# Patient Record
Sex: Female | Born: 1970 | Race: Black or African American | Hispanic: No | Marital: Married | Smoking: Never smoker
Health system: Southern US, Community
[De-identification: ages and names within clinical notes are randomized; demographics above are authoritative.]

## PROBLEM LIST (undated history)

## (undated) ENCOUNTER — Inpatient Hospital Stay (HOSPITAL_COMMUNITY): Payer: Self-pay

## (undated) DIAGNOSIS — E8881 Metabolic syndrome: Secondary | ICD-10-CM

## (undated) DIAGNOSIS — R51 Headache: Secondary | ICD-10-CM

## (undated) DIAGNOSIS — M1990 Unspecified osteoarthritis, unspecified site: Secondary | ICD-10-CM

## (undated) DIAGNOSIS — F329 Major depressive disorder, single episode, unspecified: Secondary | ICD-10-CM

## (undated) DIAGNOSIS — F419 Anxiety disorder, unspecified: Secondary | ICD-10-CM

## (undated) DIAGNOSIS — Z9289 Personal history of other medical treatment: Secondary | ICD-10-CM

## (undated) DIAGNOSIS — F32A Depression, unspecified: Secondary | ICD-10-CM

## (undated) DIAGNOSIS — L709 Acne, unspecified: Secondary | ICD-10-CM

## (undated) DIAGNOSIS — D649 Anemia, unspecified: Secondary | ICD-10-CM

## (undated) DIAGNOSIS — R011 Cardiac murmur, unspecified: Secondary | ICD-10-CM

## (undated) DIAGNOSIS — R519 Headache, unspecified: Secondary | ICD-10-CM

## (undated) DIAGNOSIS — K219 Gastro-esophageal reflux disease without esophagitis: Secondary | ICD-10-CM

## (undated) DIAGNOSIS — I10 Essential (primary) hypertension: Secondary | ICD-10-CM

## (undated) HISTORY — PX: JOINT REPLACEMENT: SHX530

## (undated) HISTORY — PX: MOUTH SURGERY: SHX715

## (undated) HISTORY — DX: Cardiac murmur, unspecified: R01.1

## (undated) HISTORY — PX: ESOPHAGOGASTRODUODENOSCOPY: SHX1529

## (undated) HISTORY — PX: COLONOSCOPY: SHX174

## (undated) HISTORY — DX: Acne, unspecified: L70.9

## (undated) HISTORY — DX: Gastro-esophageal reflux disease without esophagitis: K21.9

---

## 2002-08-14 ENCOUNTER — Encounter: Payer: Self-pay | Admitting: Family Medicine

## 2002-08-14 ENCOUNTER — Encounter: Admission: RE | Admit: 2002-08-14 | Discharge: 2002-08-14 | Payer: Self-pay | Admitting: Family Medicine

## 2002-08-28 ENCOUNTER — Other Ambulatory Visit: Admission: RE | Admit: 2002-08-28 | Discharge: 2002-08-28 | Payer: Self-pay | Admitting: *Deleted

## 2002-10-31 ENCOUNTER — Encounter: Admission: RE | Admit: 2002-10-31 | Discharge: 2002-10-31 | Payer: Self-pay | Admitting: Family Medicine

## 2002-10-31 ENCOUNTER — Encounter: Payer: Self-pay | Admitting: Family Medicine

## 2003-09-03 ENCOUNTER — Inpatient Hospital Stay (HOSPITAL_COMMUNITY): Admission: AD | Admit: 2003-09-03 | Discharge: 2003-09-03 | Payer: Self-pay | Admitting: *Deleted

## 2003-09-12 ENCOUNTER — Inpatient Hospital Stay (HOSPITAL_COMMUNITY): Admission: AD | Admit: 2003-09-12 | Discharge: 2003-09-15 | Payer: Self-pay | Admitting: *Deleted

## 2003-10-23 ENCOUNTER — Encounter: Admission: RE | Admit: 2003-10-23 | Discharge: 2003-11-22 | Payer: Self-pay | Admitting: *Deleted

## 2003-10-24 ENCOUNTER — Other Ambulatory Visit: Admission: RE | Admit: 2003-10-24 | Discharge: 2003-10-24 | Payer: Self-pay | Admitting: *Deleted

## 2003-11-23 ENCOUNTER — Encounter: Admission: RE | Admit: 2003-11-23 | Discharge: 2003-12-23 | Payer: Self-pay | Admitting: *Deleted

## 2004-01-23 ENCOUNTER — Encounter: Admission: RE | Admit: 2004-01-23 | Discharge: 2004-02-22 | Payer: Self-pay | Admitting: *Deleted

## 2004-02-23 ENCOUNTER — Encounter: Admission: RE | Admit: 2004-02-23 | Discharge: 2004-03-24 | Payer: Self-pay | Admitting: *Deleted

## 2004-04-22 ENCOUNTER — Encounter: Admission: RE | Admit: 2004-04-22 | Discharge: 2004-05-22 | Payer: Self-pay | Admitting: *Deleted

## 2004-06-22 ENCOUNTER — Encounter: Admission: RE | Admit: 2004-06-22 | Discharge: 2004-07-22 | Payer: Self-pay | Admitting: Obstetrics and Gynecology

## 2004-11-23 ENCOUNTER — Other Ambulatory Visit: Admission: RE | Admit: 2004-11-23 | Discharge: 2004-11-23 | Payer: Self-pay | Admitting: Obstetrics and Gynecology

## 2005-05-27 ENCOUNTER — Ambulatory Visit: Payer: Self-pay | Admitting: Family Medicine

## 2005-08-05 ENCOUNTER — Ambulatory Visit: Payer: Self-pay | Admitting: Family Medicine

## 2005-08-09 ENCOUNTER — Encounter: Admission: RE | Admit: 2005-08-09 | Discharge: 2005-08-09 | Payer: Self-pay | Admitting: Family Medicine

## 2005-08-09 LAB — HM MAMMOGRAPHY: HM Mammogram: NORMAL

## 2006-01-04 ENCOUNTER — Other Ambulatory Visit: Admission: RE | Admit: 2006-01-04 | Discharge: 2006-01-04 | Payer: Self-pay | Admitting: Obstetrics and Gynecology

## 2006-04-08 ENCOUNTER — Ambulatory Visit: Payer: Self-pay | Admitting: Family Medicine

## 2006-05-16 ENCOUNTER — Ambulatory Visit: Payer: Self-pay | Admitting: Family Medicine

## 2006-06-14 ENCOUNTER — Encounter: Admission: RE | Admit: 2006-06-14 | Discharge: 2006-06-14 | Payer: Self-pay | Admitting: Gastroenterology

## 2007-07-11 ENCOUNTER — Ambulatory Visit: Payer: Self-pay | Admitting: Family Medicine

## 2007-07-11 DIAGNOSIS — R011 Cardiac murmur, unspecified: Secondary | ICD-10-CM | POA: Insufficient documentation

## 2007-07-11 DIAGNOSIS — L708 Other acne: Secondary | ICD-10-CM | POA: Insufficient documentation

## 2007-08-06 ENCOUNTER — Emergency Department: Payer: Self-pay | Admitting: Emergency Medicine

## 2007-09-27 ENCOUNTER — Encounter: Payer: Self-pay | Admitting: Family Medicine

## 2007-10-06 ENCOUNTER — Ambulatory Visit: Payer: Self-pay | Admitting: Family Medicine

## 2007-11-03 ENCOUNTER — Ambulatory Visit: Payer: Self-pay | Admitting: Family Medicine

## 2008-01-04 ENCOUNTER — Ambulatory Visit: Payer: Self-pay | Admitting: Family Medicine

## 2008-08-08 ENCOUNTER — Ambulatory Visit: Payer: Self-pay | Admitting: Family Medicine

## 2009-02-24 ENCOUNTER — Ambulatory Visit: Payer: Self-pay | Admitting: Internal Medicine

## 2009-03-05 ENCOUNTER — Ambulatory Visit: Payer: Self-pay | Admitting: Family Medicine

## 2009-03-05 DIAGNOSIS — R229 Localized swelling, mass and lump, unspecified: Secondary | ICD-10-CM | POA: Insufficient documentation

## 2009-03-12 ENCOUNTER — Encounter: Payer: Self-pay | Admitting: Family Medicine

## 2009-03-19 ENCOUNTER — Ambulatory Visit: Payer: Self-pay | Admitting: Family Medicine

## 2009-03-20 ENCOUNTER — Encounter: Payer: Self-pay | Admitting: Family Medicine

## 2009-03-21 ENCOUNTER — Encounter (INDEPENDENT_AMBULATORY_CARE_PROVIDER_SITE_OTHER): Payer: Self-pay | Admitting: *Deleted

## 2009-08-29 ENCOUNTER — Ambulatory Visit: Payer: Self-pay | Admitting: Family Medicine

## 2009-08-30 ENCOUNTER — Encounter: Payer: Self-pay | Admitting: Family Medicine

## 2009-09-02 LAB — CONVERTED CEMR LAB
ALT: 12 units/L (ref 0–35)
Alkaline Phosphatase: 44 units/L (ref 39–117)
Basophils Relative: 1.9 % (ref 0.0–3.0)
CO2: 29 meq/L (ref 19–32)
Chloride: 106 meq/L (ref 96–112)
Creatinine, Ser: 0.7 mg/dL (ref 0.4–1.2)
Eosinophils Absolute: 0.3 10*3/uL (ref 0.0–0.7)
GFR calc non Af Amer: 120.5 mL/min (ref >60)
HCT: 38.6 % (ref 36.0–46.0)
Lymphs Abs: 2.8 10*3/uL (ref 0.7–4.0)
MCHC: 33.7 g/dL (ref 30.0–36.0)
Monocytes Absolute: 0.5 10*3/uL (ref 0.1–1.0)
Neutro Abs: 4.7 10*3/uL (ref 1.4–7.7)
Potassium: 3.9 meq/L (ref 3.5–5.1)
RBC: 3.96 M/uL (ref 3.87–5.11)
Sodium: 141 meq/L (ref 135–145)
TSH: 0.61 microintl units/mL (ref 0.35–5.50)
Total CHOL/HDL Ratio: 3
WBC: 8.5 10*3/uL (ref 4.5–10.5)

## 2009-09-19 ENCOUNTER — Ambulatory Visit: Payer: Self-pay | Admitting: Family Medicine

## 2009-09-23 ENCOUNTER — Encounter: Payer: Self-pay | Admitting: Family Medicine

## 2010-01-20 ENCOUNTER — Ambulatory Visit: Payer: Self-pay | Admitting: Family Medicine

## 2010-01-20 ENCOUNTER — Telehealth: Payer: Self-pay | Admitting: Family Medicine

## 2010-01-20 LAB — CONVERTED CEMR LAB
Blood in Urine, dipstick: NEGATIVE
Nitrite: NEGATIVE
Specific Gravity, Urine: 1.02
pH: 6

## 2010-04-01 ENCOUNTER — Encounter: Admission: RE | Admit: 2010-04-01 | Discharge: 2010-04-01 | Payer: Self-pay | Admitting: Family Medicine

## 2010-04-01 ENCOUNTER — Ambulatory Visit: Payer: Self-pay | Admitting: Family Medicine

## 2010-04-01 DIAGNOSIS — M25559 Pain in unspecified hip: Secondary | ICD-10-CM | POA: Insufficient documentation

## 2010-06-06 ENCOUNTER — Ambulatory Visit: Payer: Self-pay | Admitting: Family Medicine

## 2010-06-06 DIAGNOSIS — M79609 Pain in unspecified limb: Secondary | ICD-10-CM | POA: Insufficient documentation

## 2010-06-09 ENCOUNTER — Encounter: Admission: RE | Admit: 2010-06-09 | Discharge: 2010-06-09 | Payer: Self-pay | Admitting: Family Medicine

## 2010-06-18 ENCOUNTER — Ambulatory Visit: Payer: Self-pay | Admitting: Family Medicine

## 2010-06-18 DIAGNOSIS — D819 Combined immunodeficiency, unspecified: Secondary | ICD-10-CM | POA: Insufficient documentation

## 2010-06-18 DIAGNOSIS — M5430 Sciatica, unspecified side: Secondary | ICD-10-CM | POA: Insufficient documentation

## 2010-06-23 LAB — CONVERTED CEMR LAB: Hepatitis B-Post: 0 milliintl units/mL

## 2010-06-24 ENCOUNTER — Ambulatory Visit: Payer: Self-pay | Admitting: Family Medicine

## 2010-07-29 ENCOUNTER — Ambulatory Visit: Payer: Self-pay | Admitting: Family Medicine

## 2010-11-10 ENCOUNTER — Ambulatory Visit (HOSPITAL_COMMUNITY): Admission: RE | Admit: 2010-11-10 | Discharge: 2010-11-10 | Payer: Self-pay | Admitting: Obstetrics and Gynecology

## 2010-12-31 ENCOUNTER — Ambulatory Visit: Admit: 2010-12-31 | Payer: Self-pay | Admitting: Family Medicine

## 2011-01-26 NOTE — Assessment & Plan Note (Signed)
Summary: 11:40 TB TEST/Ison Wichmann/CLE  Nurse Visit   Immunizations Administered:  PPD Skin Test:    Vaccine Type: PPD    Site: left forearm    Mfr: Sanofi Pasteur    Dose: 0.1 ml    Route: ID    Given by: Linde Gillis CMA (AAMA)    Exp. Date: 03/03/2012    Lot #: [id number]  PPD Results    Date of reading: 01/23/2010    Results: < 5mm    Interpretation: negative  Orders Added: 1)  TB Skin Test [86580] 2)  Admin 1st Vaccine [16073]

## 2011-01-26 NOTE — Assessment & Plan Note (Signed)
Summary: leg pain/alc   Vital Signs:  Patient profile:   40 year old female Height:      61.75 inches Weight:      163.75 pounds BMI:     30.30 Temp:     97.9 degrees F oral Pulse rate:   64 / minute Pulse rhythm:   regular BP sitting:   100 / 72  (left arm) Cuff size:   regular  Vitals Entered By: Lewanda Rife LPN (April 01, 453 9:02 AM) CC: Dull pain in left leg. Now on pain scale a 5. Pt said been going on for a while but this last week difficult to sleep at night due to leg hurting. No known injury   History of Present Illness: left leg is killing her  starts at groin area -- over her upper leg - rad down her whole leg worse since thurs -- but on and off for over a year  is keeping her up at night  no position is good at all   back does not hurt and no buttock pain   no fam hx of hip problems   sometimes when she moves - tendon in her hip pops and feels tight   at times is dull and achey- but sharp with specific activities  massage does not help at all   has taken some ibuprofen- helps a little   no new exercise       Allergies (verified): No Known Drug Allergies  Past History:  Past Medical History: Last updated: 10-12-09 acne ? heart M in past  gyn- Dr Rana Snare  [facility]  Past Surgical History: Last updated: 07/11/2007 Caesarean section  Family History: Last updated: 10-12-09 Father: deceased- ? 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 Eyes:  Denies blurring. Resp:  Denies cough and shortness of breath. GI:   Denies abdominal pain. GU:  Denies discharge, dysuria, hematuria, and urinary frequency. MS:  Complains of joint pain and stiffness; denies joint redness, joint swelling, low back pain, cramps, and muscle weakness. Derm:  Denies itching, lesion(s), poor wound healing, and rash. Neuro:  Denies numbness, tingling, and weakness. Heme:  Denies abnormal bruising and bleeding.  Physical Exam  General:  Well-developed,well-nourished,in no acute distress; alert,appropriate and cooperative throughout examination Head:  normocephalic, atraumatic, and no abnormalities observed.   Neck:  nl rom , no crepitice or tenderness  Lungs:  Normal respiratory effort, chest expands symmetrically. Lungs are clear to auscultation, no crackles or wheezes. Heart:  Normal rate and regular rhythm. S1 and S2 normal without gallop, murmur, click, rub or other extra sounds. (no M heard today) Abdomen:  no suprapubic tenderness or fullness felt  Msk:  mildly tender L groin- no LN pain on ext and less int rot of hip nl slr no LS tenderness- full rom  no swelling/ heat /palp cords on leg Pulses:  R and L carotid,radial,femoral,dorsalis pedis and posterior tibial pulses are full and equal bilaterally Extremities:  no CCE Neurologic:  strength normal in all extremities, sensation intact to pinprick, gait normal, and DTRs symmetrical and normal.   Skin:  Intact without suspicious lesions or rashes Cervical Nodes:  No lymphadenopathy noted Inguinal Nodes:  No significant adenopathy Psych:  normal affect, talkative and pleasant    Impression & Recommendations:  Problem # 1:  HIP PAIN, LEFT (ICD-719.45) Assessment New hip and groin pain reproduced by ext rot- no neurol symptoms and no troch tenderness sent for x ray of hip  ibuprofen/ ice /heat as needed  update with result  Orders: Radiology Referral (Radiology)  Complete Medication List: 1)  Clindagel 1 % Gel (Clindamycin phosphate) .... Use as directed 2)   Ambien 10 Mg Tabs (Zolpidem tartrate) .... Take by mouth as directed as needed 3)  Prozac 10 Mg Caps (Fluoxetine hcl) .... Take one capsule by mouth daily for one week prior to menstrual period  Patient Instructions: 1)  ibuprofen otc is ok with food  2)  ice or heat is ok if it helps  3)  we will set up x ray at check out and then make a plan based on result   Current Allergies (reviewed today): No known allergies

## 2011-01-26 NOTE — Progress Notes (Signed)
Summary: Urine dip  Phone Note Call from Patient   Caller: Patient Call For: Judith Part MD Summary of Call: Patient came in today to get PPD placed.  She left a urine sample as well, said she didn't know if she had a UTI but wanted to check and be sure.  Did U/A dip.  Advised patient that we don't typically do U/As without an office visit because if there is infection the patient would need to be seen.  Advised her to come back on Thursday or Friday of this week to have PPD skin test read.  If she does not come back on either of those days, the PPD has to be reapplied. Initial call taken by: Linde Gillis CMA Duncan Dull),  January 20, 2010 11:58 AM  Follow-up for Phone Call        Advised pt to come in for visit if symptoms continue. Follow-up by: Lowella Petties CMA,  January 20, 2010 2:40 PM  New Problems: OTHER SYMPTOMS INVOLVING URINARY SYSTEM (ICD-788.99)   New Problems: OTHER SYMPTOMS INVOLVING URINARY SYSTEM (ICD-788.99)  Laboratory Results   Urine Tests   Date/Time Reported: January 20, 2010 11:58 AM   Routine Urinalysis   Color: yellow Appearance: Clear Glucose: negative   (Normal Range: Negative) Bilirubin: negative   (Normal Range: Negative) Ketone: negative   (Normal Range: Negative) Spec. Gravity: 1.020   (Normal Range: 1.003-1.035) Blood: negative   (Normal Range: Negative) pH: 6.0   (Normal Range: 5.0-8.0) Protein: negative   (Normal Range: Negative) Urobilinogen: 0.2   (Normal Range: 0-1) Nitrite: negative   (Normal Range: Negative) Leukocyte Esterace: negative   (Normal Range: Negative)    Comments: urine is negative - f/u for visit if symptoms persist

## 2011-01-26 NOTE — Assessment & Plan Note (Signed)
Summary: PAIN IN BOTH LEGS/CLE   Vital Signs:  Patient profile:   40 year old female Weight:      166 pounds BMI:     30.72 Temp:     98 degrees F oral Pulse rate:   76 / minute Pulse rhythm:   regular BP sitting:   130 / 90  (right arm) Cuff size:   regular  Vitals Entered By: Lowella Petties CMA (June 06, 2010 10:35 AM) CC: Pain in both legs, started in right arm this morning.   History of Present Illness: is having pain in her legs --was going to make appt with specialist but she did not  it went away for a while -- and then came back    R groin to knee-- is having sharp pain  pain throbs at times worse when she drives  foot feels funny -- like a little numb- but not tingly   still has hip problem in L leg and now in R leg   leg pain comes and goes - shooting  there is background constant throbbing pain   hard to get up from sitting on the floor  no particular comfortable position  is worse at night in bed -- hard to sleep   R arm hurts a little   has taken some motrin - with a little relief     Allergies (verified): No Known Drug Allergies  Past History:  Past Medical History: Last updated: 2009-10-01 acne ? heart M in past  gyn- Dr Rana Snare  [facility]  Past Surgical History: Last updated: 07/11/2007 Caesarean section  Family History: Last updated: 10-01-09 Father: deceased- ? 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 Eyes:  Denies blurring. ENT:  Denies sore throat. CV:  Denies chest pain or  discomfort, palpitations, shortness of breath with exertion, and swelling of feet. Resp:  Denies cough, shortness of breath, and wheezing. GI:  Denies change in bowel habits, nausea, and vomiting. GU:  Denies dysuria and urinary frequency. MS:  Complains of low back pain and stiffness; denies joint redness, joint swelling, and muscle weakness. Derm:  Denies itching, lesion(s), poor wound healing, and rash. Neuro:  Denies numbness and tingling. Psych:  Denies anxiety and depression. Endo:  Denies cold intolerance and heat intolerance. Heme:  Denies abnormal bruising and bleeding.  Physical Exam  General:  Well-developed,well-nourished,in no acute distress; alert,appropriate and cooperative throughout examination Head:  normocephalic, atraumatic, and no abnormalities observed.   Eyes:  vision grossly intact, pupils equal, pupils round, and pupils reactive to light.   Neck:  supple with full rom and no masses or thyromegally, no JVD or carotid bruit  Lungs:  Normal respiratory effort, chest expands symmetrically. Lungs are clear to auscultation, no crackles or wheezes. Heart:  Normal rate and regular rhythm. S1 and S2 normal without gallop, murmur, click, rub or other extra sounds. (no M heard today) Abdomen:  Bowel sounds positive,abdomen soft and non-tender without masses, organomegaly or hernias noted. no suprapubic tenderness  Msk:  tender upper LS  tender SI areas bilat  some peri lumbar muscular  tenderness st leg raise yeilds quad pain bilat and slt back pain nl rom hips - some pain on int rot standing- nl rom of spine with fair flexibility  Pulses:  R and L carotid,radial,femoral,dorsalis pedis and posterior tibial pulses are full and equal bilaterally Extremities:  no CCE no palp cords no tenderness/ swelling/ warmth or redness in either leg neg homann's sign  Neurologic:  strength normal in all extremities, sensation intact to light touch, gait normal, and DTRs symmetrical and  normal.   Skin:  Intact without suspicious lesions or rashes Cervical Nodes:  No lymphadenopathy noted Inguinal Nodes:  No significant adenopathy Psych:  normal affect, talkative and pleasant    Impression & Recommendations:  Problem # 1:  LEG PAIN, BILATERAL (ICD-729.5) pain in upper legs/ buttocks with some LS tenderness and c/o intermittent foot parasthesia  is intermittent with some background throbbing ? if poss radiculopathy or lumbar source  past hip x ray was normal  will schedule xr of LS first thing on monday given muscle relaxer- flexeril / and can use motrin otc as tolerated  update if worse or any neurol symptos  Complete Medication List: 1)  Clindagel 1 % Gel (Clindamycin phosphate) .... Use as directed 2)  Ambien 10 Mg Tabs (Zolpidem tartrate) .... Take by mouth as directed as needed 3)  Prozac 10 Mg Caps (Fluoxetine hcl) .... Take one capsule by mouth daily for one week prior to menstrual period 4)  Flexeril 10 Mg Tabs (Cyclobenzaprine hcl) .... 1/2 to 1 by mouth up to three times a day as needed  Patient Instructions: 1)  use heat/ ice on low back if it hurts  2)  we will call you monday to set up spine films  3)  call back if pain worsens 4)  try the muscle relaxer with caution- it may sedate 5)  motrin over the counter with food as needed  Prescriptions: FLEXERIL 10 MG TABS (CYCLOBENZAPRINE HCL) 1/2 to 1 by mouth up to three times a day as needed  #30 x 0   Entered and Authorized by:   Judith Part MD   Signed by:   Judith Part MD on 06/06/2010   Method used:   Print then Give to Patient   RxID:   [id number]   Prior Medications (reviewed today): CLINDAGEL 1 %  GEL (CLINDAMYCIN PHOSPHATE) use as directed AMBIEN 10 MG  TABS (ZOLPIDEM TARTRATE) take by mouth as directed as needed PROZAC 10 MG CAPS (FLUOXETINE HCL) take one capsule by mouth daily for one week prior to menstrual period Current Allergies (reviewed today): No known  allergies     Appended Document: PAIN IN BOTH LEGS/CLE    Clinical Lists Changes  Orders: Added new Referral order of Radiology Referral (Radiology) - Signed

## 2011-01-26 NOTE — Assessment & Plan Note (Signed)
Summary: HEP B VACCINE STARTED/RI  Nurse Visit   Allergies: No Known Drug Allergies  Immunizations Administered:  Hepatitis B Vaccine # 4:    Vaccine Type: HepB Adult    Site: left deltoid    Mfr: Merck    Dose: 1.0 ml    Route: IM    Given by: Delilah Shan CMA (AAMA)    Exp. Date: 03/24/2012    Lot #: [id number]    VIS given: 07/13/06 version given June 24, 2010.  Hepatitis B Vaccine # 1 (to be given today)  Orders Added: 1)  Hepatitis B Vaccine >77yrs [90746] 2)  Admin 1st Vaccine [16109]

## 2011-01-26 NOTE — Assessment & Plan Note (Signed)
Summary: 2ND HEP B SHOT/TOWER/CLE  Nurse Visit   Allergies: No Known Drug Allergies  Immunizations Administered:  Hepatitis B Vaccine # 2:    Vaccine Type: HepB Adult    Site: right deltoid    Mfr: Merck    Dose: 1.0 ml    Route: IM    Given by: Lewanda Rife LPN    Exp. Date: 03/24/2012    Lot #: [id number]    VIS given: 07/13/06 version given July 29, 2010.  Orders Added: 1)  Hepatitis B Vaccine >79yrs [90746] 2)  Admin 1st Vaccine [phone]

## 2011-01-26 NOTE — Assessment & Plan Note (Signed)
Summary: BOTH LEGS HURT PER DR TOWER/RI   Vital Signs:  Patient profile:   40 year old female Height:      61.75 inches Weight:      161.6 pounds BMI:     29.90 Temp:     98.3 degrees F oral Pulse rate:   76 / minute Pulse rhythm:   regular BP sitting:   120 / 70  (left arm) Cuff size:   regular  Vitals Entered By: Benny Lennert CMA Duncan Dull) (June 18, 2010 10:04 AM)  History of Present Illness: Chief complaint Both legs with pain  very finally 40 year old female who presents  for evaluation of bilateral leg pain with some radiculopathy, seen at the request of Dr. Milinda Antis for evaluation.  Every once in a while a couple of years ago, -- would snap and be able to feel this. Has been happening more frequently. this is primarily at the left hip. It is not causing a great deal of distress.  When sitting for a while and putting weight on her left leg.   Then for a couple of weeks ago, from her groin down into her knee, felt like she was having some muscle cramps and pain.  Also on the right and the left, she is having some occasional tingling.  No bowel or bladder incontinence. was having some radiating a few weeks ago just down to below the knee. Also got some down to her foot.   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  Hip x-rays, reviewed with the patient, no evidence of significant arthritis, no evidence of traumatic fracture.  Allergies (verified): No Known Drug Allergies  Past History:  Past medical, surgical, family and social histories (including risk factors) reviewed, and no changes noted (except as noted below).  Past Medical History: Reviewed history from 09/19/2009 and no changes required. acne ? heart M in past  gyn- Dr Rana Snare  [facility]  Past Surgical History: Reviewed history from 07/11/2007 and no  changes required. Caesarean section  Family History: Reviewed history from 09/19/2009 and no changes required. Father: deceased- ? lung cancer/ also renal disease  Mother: HTN, depression, DM, overwt , thyroid problem  Siblings: sister- hydrocephalus  uncle brain tumor (b9) aunt DM  Social History: Reviewed history from 09/19/2009 and no changes required. 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  As above. No focal numbness. No weakness.  Physical Exam  Additional Exam:  Gen: Well-developed,well-nourished,in no acute distress; alert,appropriate and cooperative throughout examination HEENT: Normocephalic and atraumatic without obvious abnormalities.  Ears, externally no deformities Pulm: Breathing comfortably in no respiratory distress Range of motion at  the waist: full Flexion: no pain Extension: no pain Rotation: no pain  No echymosis or edema Rises to examination table with no difficulty Gait: minimally antalgic   Inspection/Deformity: N Paraspinus T: NT  B Ankle Dorsiflexion (L5,4): 5/5 B Great Toe Dorsiflexion (L5,4): 5/5 Heel Walk (L5): WNL Toe Walk (S1): WNL Rise/Squat (L4): WNL  SENSORY B Medial Foot (L4): WNL B Dorsum (L5): WNL B Lateral (S1): WNL Light Touch: WNL Pinprick: WNL  REFLEXES Knee (L4): 2+ Ankle (S1): 2+  B SLR, seated: neg B SLR,  supine: neg B FABER: neg B Reverse FABER: + L > R B Greater Troch: NT B Log Roll: neg B Stork: NT B Sciatic Notch: NT Leg Lengths: equal    Impression & Recommendations:  Problem # 1:  SCIATICA, 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  piriformis spasm, leading to sciatica.  review treatment  options including  primarily stretching, and direct massage. Also  range of motion at the hips, and core stability of key import including  weight loss and fitness  overall.  Reviewed program in detail with pt.  Would suspect meds not of much benefit here.  cc: Dr. Milinda Antis  Her updated medication list for this problem includes:    Flexeril 10 Mg Tabs (Cyclobenzaprine hcl) .Marland Kitchen... 1/2 to 1 by mouth up to three times a day as needed  Problem # 2:  HIP PAIN, LEFT (ICD-719.45) Assessment: New Snapping hip syndrome on L, benign, and may improve with increased flexibility  Her updated medication list for this problem includes:    Flexeril 10 Mg Tabs (Cyclobenzaprine hcl) .Marland Kitchen... 1/2 to 1 by mouth up to three times a day as needed  Problem # 3:  LEG PAIN, BILATERAL (ICD-729.5)  Complete Medication List: 1)  Clindagel 1 % Gel (Clindamycin phosphate) .... Use as directed 2)  Ambien 10 Mg Tabs (Zolpidem tartrate) .... Take by mouth as directed as needed 3)  Prozac 10 Mg Caps (Fluoxetine hcl) .... Take one capsule by mouth daily for one week prior to menstrual period 4)  Flexeril 10 Mg Tabs (Cyclobenzaprine hcl) .... 1/2 to 1 by mouth up to three times a day as needed  Current Allergies (reviewed today): No known allergies

## 2011-01-29 ENCOUNTER — Encounter: Payer: Self-pay | Admitting: Family Medicine

## 2011-01-29 ENCOUNTER — Ambulatory Visit (INDEPENDENT_AMBULATORY_CARE_PROVIDER_SITE_OTHER): Payer: 59 | Admitting: Family Medicine

## 2011-01-29 DIAGNOSIS — R5383 Other fatigue: Secondary | ICD-10-CM

## 2011-01-29 DIAGNOSIS — Z23 Encounter for immunization: Secondary | ICD-10-CM

## 2011-01-29 DIAGNOSIS — K219 Gastro-esophageal reflux disease without esophagitis: Secondary | ICD-10-CM | POA: Insufficient documentation

## 2011-01-29 DIAGNOSIS — R5381 Other malaise: Secondary | ICD-10-CM | POA: Insufficient documentation

## 2011-02-02 ENCOUNTER — Telehealth: Payer: Self-pay | Admitting: Family Medicine

## 2011-02-02 LAB — CONVERTED CEMR LAB
BUN: 7 mg/dL (ref 6–23)
Basophils Absolute: 0.1 10*3/uL (ref 0.0–0.1)
Basophils Relative: 1 % (ref 0–1)
CO2: 24 meq/L (ref 19–32)
HCT: 36.3 % (ref 36.0–46.0)
Iron: 51 ug/dL (ref 42–145)
Lymphocytes Relative: 32 % (ref 12–46)
MCHC: 32.8 g/dL (ref 30.0–36.0)
Monocytes Absolute: 0.8 10*3/uL (ref 0.1–1.0)
Monocytes Relative: 6 % (ref 3–12)
RBC: 3.95 M/uL (ref 3.87–5.11)
RDW: 13.1 % (ref 11.5–15.5)
Saturation Ratios: 16 % — ABNORMAL LOW (ref 20–55)
TIBC: 320 ug/dL (ref 250–470)
Transferrin: 296 mg/dL (ref 212–360)
WBC: 12.4 10*3/uL — ABNORMAL HIGH (ref 4.0–10.5)

## 2011-02-03 ENCOUNTER — Ambulatory Visit (INDEPENDENT_AMBULATORY_CARE_PROVIDER_SITE_OTHER): Payer: 59

## 2011-02-03 ENCOUNTER — Encounter: Payer: Self-pay | Admitting: Family Medicine

## 2011-02-03 DIAGNOSIS — D72829 Elevated white blood cell count, unspecified: Secondary | ICD-10-CM

## 2011-02-03 LAB — CONVERTED CEMR LAB
Bilirubin Urine: NEGATIVE
Blood in Urine, dipstick: NEGATIVE
Glucose, Urine, Semiquant: NEGATIVE
Ketones, urine, test strip: NEGATIVE
Nitrite: NEGATIVE
Protein, U semiquant: NEGATIVE
Specific Gravity, Urine: 1.01
WBC Urine, dipstick: NEGATIVE
pH: 6

## 2011-02-11 NOTE — Progress Notes (Signed)
Summary: ? about instructions for Protonix  Phone Note Call from Patient Call back at [phone]   Caller: Patient Call For: Judith Part MD Summary of Call: Pt said Protonix is helping but does not last thru the evening hours and pt wants to know if she can take Protonix in AM and PM.Please advise.  Initial call taken by: Lewanda Rife LPN,  February 02, 2011 1:15 PM  Follow-up for Phone Call        take it two times a day for 1-2 weeks then go back to once daily in am and let me know how she is doing  Follow-up by: Judith Part MD,  February 02, 2011 2:43 PM  Additional Follow-up for Phone Call Additional follow up Details #1::        Left message for patient to call back. Lewanda Rife LPN  February 02, 2011 2:45 PM   Patient notified as instructed by telephone. Lewanda Rife LPN  February 02, 2011 4:17 PM

## 2011-02-11 NOTE — Assessment & Plan Note (Signed)
Summary: fatigue/alc   Vital Signs:  Patient profile:   40 year old female Height:      61.75 inches Weight:      170.25 pounds BMI:     31.51 Temp:     97.9 degrees F oral Pulse rate:   80 / minute Pulse rhythm:   regular BP sitting:   124 / 80  (left arm) Cuff size:   regular  Vitals Entered By: Lewanda Rife LPN (January 29, 2011 4:25 PM) CC: fatigue, sleepy even if gets good night sleep,At OB GYN yrly ck was told lab showed anemia.   History of Present Illness: here with fatigue and sleepiness  has been going on for a while  went for gyn check up in the fall  did fingerstick and said 10.8 -- this is low for her  he did sickle cell test   mixed ethnicity -- white and african [place]   had been having heavy peroids  did hystosalpingogram - did ultraound too with fibroids  worse after iud removed  taking iron  eating more red meat  big salad eater - dark greens all the time   is trying to get pregnant  doing fintstones for her prenatal vit    exercises 2 times per week  gets good sleep -- with or without ambien on certain times  is not taking prozac often   last period - about 2 weeks ago  ovulated monday  no blood in stool  occ gets some abd cramping , heartburn occasionally  not a lot of appetite    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 ENT:  Denies sinus pressure and sore throat. CV:  Denies chest pain or discomfort, lightheadness, near fainting, and palpitations. Resp:  Denies cough, shortness of breath, and wheezing. GI:  Complains of indigestion; denies abdominal pain, bloody stools, change in bowel habits, dark tarry stools, nausea, and vomiting. GU:  Complains of abnormal  vaginal bleeding; denies discharge, dysuria, and hematuria. MS:  Denies joint pain, cramps, and stiffness. Derm:  Denies itching, lesion(s), poor wound healing, and rash. Neuro:  Denies headaches, numbness, sensation of room spinning, visual disturbances, and weakness. Psych:  Denies anxiety and depression. Endo:  Denies cold intolerance, excessive thirst, excessive urination, and heat intolerance. Heme:  Denies abnormal bruising and bleeding.  Physical Exam  General:  overweight but generally well appearing  Head:  normocephalic, atraumatic, and no abnormalities observed.   Eyes:  vision grossly intact, pupils equal, pupils round, pupils reactive to light, and no injection.   Ears:  R ear normal and L ear normal.   Nose:  no nasal discharge.   Mouth:  pharynx pink and moist, no erythema, and no exudates.   Neck:  supple with full rom and no masses or thyromegally, no JVD or carotid bruit  Lungs:  Normal respiratory effort, chest expands symmetrically. Lungs are clear to auscultation, no crackles or wheezes. Heart:  Normal rate and regular rhythm. S1 and S2 normal without gallop, murmur, click, rub or other extra sounds. (no M heard today) Abdomen:  Bowel sounds positive,abdomen soft and non-tender without masses, organomegaly or  hernias noted. Msk:  No deformity or scoliosis noted of thoracic or lumbar spine.  no acute joint changes  Pulses:  R and L carotid,radial,femoral,dorsalis pedis and posterior tibial pulses are full and equal bilaterally Extremities:  No clubbing, cyanosis, edema, or deformity noted with normal full range of motion of all joints.   Neurologic:  strength normal in all extremities, sensation intact to light touch, gait normal, and DTRs symmetrical and normal.   Skin:  Intact without suspicious lesions or rashes Cervical Nodes:  No lymphadenopathy noted Inguinal Nodes:  No significant adenopathy Psych:  normal affect, talkative and pleasant    Impression &  Recommendations:  Problem # 1:  FATIGUE (ICD-780.79) Assessment New with hx of iron def anemia in fall due to menses check cbc and other labs  disc stressors and lifestyle habits  Orders: Venipuncture (29562) Specimen Handling (13086) T-Comprehensive Metabolic Panel (57846-96295) T-CBC w/Diff (28413-24401) T-TSH [phone]) T-Iron Binding Capacity (TIBC) ([address]) T-Iron (63875-64332) T- * Misc. Laboratory test [phone])  Problem # 2:  GERD (ICD-530.81) Assessment: Deteriorated  ongoing problem- urged to go back on protonix daily sent to pharmacy pt advised to update me if symptoms worsen or do not improve  Her updated medication list for this problem includes:    Protonix 40 Mg Tbec (Pantoprazole sodium) .Marland Kitchen... 1 by mouth once daily in am  Orders: Prescription Created Electronically [phone]  Complete Medication List: 1)  Clindagel 1 % Gel (Clindamycin phosphate) .... Use as directed 2)  Ambien 10 Mg Tabs (Zolpidem tartrate) .... Take by mouth as directed as needed 3)  Prozac 10 Mg Caps (Fluoxetine hcl) .... Take one capsule by mouth daily for one week prior to menstrual period as needed 4)  Vitamin D 1000 Unit Tabs (Cholecalciferol) .... Take 1 tablet by mouth once a day 5)  Vitamin C 1000 Mg Tabs (Ascorbic acid) .... Take 1 tablet by mouth once a day 6)  Ra Iron 27 Mg Tabs (Ferrous sulfate) .... Take 1 tablet by mouth once a day 7)  Flintstones Gummies Chew (Pediatric multivit-minerals-c) .... Chews one vitamin daily 8)  Fish Oil Oil (Fish oil) .... Take 1 capsule by mouth once a day 9)  Protonix 40 Mg Tbec (Pantoprazole sodium) .Marland Kitchen.. 1 by mouth once daily in am  Other Orders: Hepatitis B Vaccine >45yrs (63016) Admin 1st Vaccine (01093)  Patient Instructions: 1)  continue iron and flintstones 2)  labs today incl blood count and iron  3)  get back on protonix  4)  I will update you with results  5)  try to take good care of yourself  Prescriptions: PROTONIX 40 MG  TBEC (PANTOPRAZOLE SODIUM) 1 by mouth once daily in am  #30 x 11   Entered and Authorized by:   Judith Part MD   Signed by:   Judith Part MD on 01/29/2011   Method used:   Electronically to        [facility]  [address] [phone]* (retail)       [address]       [place], [place]  [address]       Ph: [phone] or [phone]       Fax: [phone]   RxID:   [id number]    Orders Added: 1)  Venipuncture [36415] 2)  Specimen Handling [99000] 3)  T-Comprehensive Metabolic Panel [80053-22900] 4)  T-CBC w/Diff [18299-37169] 5)  T-TSH [67893-81017] 6)  T-Iron Binding Capacity (TIBC) [[address]] 7)  T-Iron [09811-91478] 8)  T- * Misc. Laboratory test [99999] 9)  Hepatitis B Vaccine >58yrs [90746] 10)  Admin 1st Vaccine [90471] 11)  Est. Patient Level IV [29562] 12)  Prescription Created Electronically [phone]   Immunizations Administered:  Hepatitis B Vaccine # 3:    Vaccine Type: HepB Adult    Site: left deltoid    Mfr: Merck    Dose: 1.0 ml    Route: IM    Given by: Lewanda Rife LPN    Exp. Date: 03/24/2012    Lot #: [id number]    VIS given: 07/13/06 version given January 29, 2011.   Immunizations Administered:  Hepatitis B Vaccine # 3:    Vaccine Type: HepB Adult    Site: left deltoid    Mfr: Merck    Dose: 1.0 ml    Route: IM    Given by: Lewanda Rife LPN    Exp. Date: 03/24/2012    Lot #: [id number]    VIS given: 07/13/06 version given January 29, 2011.  Hepatitis B Vaccine # 5 (to be given today)  Current Allergies (reviewed today): No known allergies

## 2011-02-17 NOTE — Assessment & Plan Note (Signed)
Summary: UA/CLE  Nurse Visit   Vitals Entered By: Delilah Shan CMA Duncan Dull) (February 03, 2011 9:22 AM)  Allergies: No Known Drug Allergies Laboratory Results   Urine Tests  Date/Time Received: February 03, 2011 9:20 AM   Routine Urinalysis   Color: yellow Appearance: Clear Glucose: negative   (Normal Range: Negative) Bilirubin: negative   (Normal Range: Negative) Ketone: negative   (Normal Range: Negative) Spec. Gravity: 1.010   (Normal Range: 1.003-1.035) Blood: negative   (Normal Range: Negative) pH: 6.0   (Normal Range: 5.0-8.0) Protein: negative   (Normal Range: Negative) Urobilinogen: 0.2   (Normal Range: 0-1) Nitrite: negative   (Normal Range: Negative) Leukocyte Esterace: negative   (Normal Range: Negative)    Comments: urine is negative  please re check cbc with diff in 2-4 weeks for leukocytosis    Orders Added: 1)  UA Dipstick W/ Micro (manual) [81000] 2)  UA Dipstick w/o Micro (manual) [81002] Left message for patient to call back. Lewanda Rife LPN  February 05, 2011 5:34 PM   Patient notified as instructed by telephone. Pt said she would have to ck her schedule and call back for appt.Lewanda Rife LPN  February 08, 2011 4:41 PM

## 2011-02-25 ENCOUNTER — Other Ambulatory Visit: Payer: Self-pay | Admitting: Family Medicine

## 2011-02-25 ENCOUNTER — Other Ambulatory Visit (INDEPENDENT_AMBULATORY_CARE_PROVIDER_SITE_OTHER): Payer: 59

## 2011-02-25 ENCOUNTER — Encounter (INDEPENDENT_AMBULATORY_CARE_PROVIDER_SITE_OTHER): Payer: Self-pay | Admitting: *Deleted

## 2011-02-25 DIAGNOSIS — D72829 Elevated white blood cell count, unspecified: Secondary | ICD-10-CM

## 2011-02-25 LAB — CBC WITH DIFFERENTIAL/PLATELET
Basophils Absolute: 0.1 10*3/uL (ref 0.0–0.1)
Hemoglobin: 11.8 g/dL — ABNORMAL LOW (ref 12.0–15.0)
Lymphs Abs: 2.4 10*3/uL (ref 0.7–4.0)
MCHC: 34.1 g/dL (ref 30.0–36.0)
Monocytes Relative: 3.7 % (ref 3.0–12.0)
Platelets: 275 10*3/uL (ref 150.0–400.0)
RBC: 3.75 Mil/uL — ABNORMAL LOW (ref 3.87–5.11)
WBC: 8.5 10*3/uL (ref 4.5–10.5)

## 2011-04-19 ENCOUNTER — Encounter: Payer: Self-pay | Admitting: Family Medicine

## 2011-04-20 ENCOUNTER — Encounter: Payer: Self-pay | Admitting: Family Medicine

## 2011-04-20 ENCOUNTER — Ambulatory Visit (INDEPENDENT_AMBULATORY_CARE_PROVIDER_SITE_OTHER): Payer: 59 | Admitting: Family Medicine

## 2011-04-20 DIAGNOSIS — R1310 Dysphagia, unspecified: Secondary | ICD-10-CM | POA: Insufficient documentation

## 2011-04-20 DIAGNOSIS — K219 Gastro-esophageal reflux disease without esophagitis: Secondary | ICD-10-CM

## 2011-04-20 MED ORDER — PANTOPRAZOLE SODIUM 40 MG PO TBEC: 40.0000 mg | DELAYED_RELEASE_TABLET | Freq: Two times a day (BID) | ORAL | Status: DC

## 2011-04-20 NOTE — Assessment & Plan Note (Signed)
This is new- see assessment for GERD Ref to GI for eval  Inc PPI to bid  Disc diet

## 2011-04-20 NOTE — Progress Notes (Signed)
Subjective:    Patient ID: Ann Frederick, female    DOB: 1971-09-01, 40 y.o.   MRN: [id number]  HPI Here for worsened acid reflux symptoms -- even with protonix At last visit in feb - pt urged to get back on it with c/o heartburn  At time was trying to get pregnant   It initially got better -- with the protonix -- until about 2 weeks ago   The other day noticed her throat was uncomfortable - to swallow Blamed it on post nasal drip Eating and exercising as usual  Few weeks ago -- really bad burning in chest like she drank battery acid  Pain in chest -- a little worse on the right  This lasted about 8 hours  Had not eaten anything unusual   It does feel like something is stuck/ throat itself is swollen feeling   Now more heartburn over the past week   No unusual foods   Getting ready to graduate school   This may have happened before ? - unsure   No exertional symptoms at all  Still feels generally tired  No sob  Is a little nauseated - no vomiting  Some intermittent diarrhea and constipation   Still taking the protonix  Did not take any otc meds  Would drink milk and that helps a bit   Has never had EGD in the past  Barium swallow 10 y ago - was ok   Not eating solid foods- afraid to   Is on flinstones vitamin  Past Medical History  Diagnosis Date  . Acne   . Heart murmur     ? heart murmur in past   Past Surgical History  Procedure Date  . Cesarean section     reports that she has never smoked. She does not have any smokeless tobacco history on file. She reports that she drinks alcohol. Her drug history not on file. family history includes Cancer in her father; Depression in her mother; Diabetes in her mother; Hydrocephalus in her sister; Hypertension in her mother; Kidney disease in her father; Obesity in her mother; and Thyroid disease in her mother. No Known Allergies       Review of Systems  Review of Systems  Constitutional: Negative for fever,  appetite change, fatigue and unexpected weight change.  Eyes: Negative for pain and visual disturbance.  Respiratory: Negative for cough and shortness of breath.   Cardiovascular: Negative.   Gastrointestinal: pos for  for nausea, diarrhea and constipation. , neg for hematemasis , jaundice or severe abd pain Genitourinary: Negative for urgency and frequency.  Skin: Negative for pallor.  Neurological: Negative for weakness, light-headedness, numbness and headaches.  Hematological: Negative for adenopathy. Does not bruise/bleed easily.  Psychiatric/Behavioral: Negative for dysphoric mood. The patient is not nervous/anxious.          Objective:   Physical Exam  Constitutional: She appears well-developed and well-nourished. No distress.  HENT:  Head: Normocephalic and atraumatic.  Mouth/Throat: Oropharynx is clear and moist.  Eyes: Conjunctivae and EOM are normal. Pupils are equal, round, and reactive to light. No scleral icterus.       No conj pallor  Neck: Normal range of motion. Neck supple. No JVD present. No thyromegaly present.  Cardiovascular: Normal rate, regular rhythm and normal heart sounds.   Pulmonary/Chest: Effort normal and breath sounds normal. No stridor.  Abdominal: Soft. Normal appearance, normal aorta and bowel sounds are normal. She exhibits no abdominal bruit, no pulsatile midline mass and  no mass. There is no hepatosplenomegaly. There is no tenderness. There is no rebound and negative Murphy's sign.  Musculoskeletal: She exhibits no edema and no tenderness.  Lymphadenopathy:    She has no cervical adenopathy.  Neurological: She is alert. She has normal reflexes.  Skin: Skin is warm and dry. No rash noted. She is not diaphoretic. No erythema. No pallor.       No jaundice Brisk cap refil  Psychiatric: She has a normal mood and affect.          Assessment & Plan:

## 2011-04-20 NOTE — Patient Instructions (Signed)
Increase protonix to twice daily  Stick with soft foods Drink enough fluids We will do GI referral at check out  Update me in the meantime if you get worse

## 2011-04-20 NOTE — Assessment & Plan Note (Signed)
Initially improved with protonix and now much worse  With episode of dysphagia -- 8 hours Now afraid to eat solids  Will inc protonix to bid  Ref to GI-- ? If needs EGD / poss dilatation Disc low caff/ low acid diet  Update if worse or other symptoms  Note- trying to get pregnant but had nl menses recently

## 2011-05-14 NOTE — Op Note (Signed)
NAME:  Ann Frederick, Ann Frederick                           ACCOUNT NO.:  [id number]   MEDICAL RECORD NO.:  [id number]                   PATIENT TYPE:  INP   LOCATION:  9114                                 FACILITY:  [facility]   PHYSICIAN:  Tracie Harrier, M.D.              DATE OF BIRTH:  January 14, 1971   DATE OF PROCEDURE:  09/12/2003  DATE OF DISCHARGE:                                 OPERATIVE REPORT   PREOPERATIVE DIAGNOSES:  1. Intrauterine pregnancy at term.  2. Repeat cesarean section.   POSTOPERATIVE DIAGNOSES:  1. Intrauterine pregnancy at term.  2. Repeat cesarean section.   PROCEDURE:  Repeat low transverse cesarean section.   SURGEON:  Tracie Harrier, M.D.   ANESTHESIA:  Spinal.   ESTIMATED BLOOD LOSS:  750 mL.   COMPLICATIONS:  None.   FINDINGS:  At 26 through a low transverse uterine incision, a viable female  infant was delivered from the vertex presentation.  Light meconium was  encountered.  The baby was a female weighing 6 pounds 10 ounces.  Apgars were  9 and 9.   The pelvis was visualized at time of surgery and pelvic adhesions were  encountered anteriorly.  There were multiple uterine fibroids, all very  small, less than 2 cm in size.  There was an approximate 3 x 3 cm posterior  fibroid noted.   PROCEDURE:  The patient was taken to the operating room where a spinal  anesthetic was administered.  The patient was placed on the operating table  in the left lateral tilt position.  The abdomen was prepped and draped in  the usual sterile fashion with Betadine and sterile drapes.  A Foley  catheter was sterilely inserted.  The abdomen was then entered through a  Pfannenstiel incision and carried down sharply in the usual fashion.  The  peritoneum was atraumatically entered.  The vesicouterine peritoneum  overlying the lower uterine segment was incised and a bladder flap was  bluntly and sharply created over the lower uterine segment.  A bladder blade  was then placed  behind the bladder to ensure its protection during the  procedure.  The uterus was then entered through a low transverse incision  and carried out laterally using the operator's fingers.  The membranes were  entered with light meconium noted.  The vertex was elevated into the  incision and delivered promptly and easily at 0753.  The oropharynx and  nasopharynx was thoroughly bulb suctioned and the cord doubly clamped and  cut.  The baby handed promptly to the pediatricians.  The baby did well, was  a female weighing 6 pounds 10 ounces.  Apgars were 9 and 9.   The placenta was then manually extracted intact with a three-vessel cord  without difficulty.  The interior of the uterus was wiped clean thoroughly  with a wet sponge.  The uterine incision was then closed in a  two layer  fashion, the first layer a running interlocking suture of 1 Vicryl, a second  imbricating suture was placed across the primary suture line with a running  suture of 1 Vicryl as well.  Good hemostasis was noted.  The pelvis was then  thoroughly irrigated and adhesions were lysed anteriorly.   Attention was then turned to closure.  The rectus muscle and anterior  peritoneum was closed with a running suture of 1 Vicryl.  The subfascial  layers were hemostasis.  The fascia was then closed with a running suture of  0 PDS.  The subcutaneous tissue was irrigated and made hemostatic using the  Bovie cautery.  The skin reapproximated with staples and a sterile dressing  applied.   Final sponge, instrument, and needle count was correct x3.  There were no  perioperative complications.  The baby and mother both did well.  The  patient did receive IV Ancef after cord clamp.                                               Tracie Harrier, M.D.    REG/MEDQ  D:  09/12/2003  T:  09/12/2003  Job:  [id number]

## 2011-05-14 NOTE — Discharge Summary (Signed)
NAME:  Ann Frederick, Ann Frederick                           ACCOUNT NO.:  [id number]   MEDICAL RECORD NO.:  [id number]                   PATIENT TYPE:  INP   LOCATION:  9114                                 FACILITY:  [facility]   PHYSICIAN:  Freddy Finner, M.D.                DATE OF BIRTH:  1971/06/19   DATE OF ADMISSION:  09/12/2003  DATE OF DISCHARGE:  09/15/2003                                 DISCHARGE SUMMARY   ADMITTING DIAGNOSES:  1. Intrauterine pregnancy at term.  2. Previous cesarean, desires repeat.   DISCHARGE DIAGNOSES:  1. Status post low transverse cesarean section.  2. Viable female infant.   PROCEDURE:  Repeat low transverse cesarean section.   REASON FOR ADMISSION:  Please see dictated H&P.   HOSPITAL COURSE:  The patient was a 40 year old married female gravida 6,  para 1 that was admitted to [facility] at 21 and 4/7 weeks  estimated gestational age.  The patient had had a previous cesarean delivery  and desired repeat.  On the morning of admission patient was taken to the  operating room where spinal anesthesia was administered without difficulty.  A low transverse incision was made with the delivery of a viable female infant  weighing 6 pounds 10 ounces with Apgars of 9 at one minute, 9 at five  minutes.  At the time of the surgery there were multiple uterine fibroids  that were noted, all very small, less than 2 cm in size.  There was also  approximately a 3 x 3 cm posterior fibroid that was noted.  The patient  tolerated procedure well and was taken to the recovery room in stable  condition.  On postoperative day one vital signs were stable.  She was  afebrile.  Abdomen was soft with good return of bowel function.  Abdominal  dressing was clean, dry, and intact.  Fundus was firm and nontender.  Laboratories revealed hemoglobin of 10.8, platelet count of 164,000, WBC  count of 11.5.  On postoperative day two patient was without complaints.  Vital signs were  stable.  She remained afebrile.  Fundus was firm and  nontender.  Abdominal dressing was removed revealing an incision that was  clean, dry, and intact.  On postoperative day three patient was doing well.  She was ambulating without assistance, tolerating a regular diet without  complaints of nausea and vomiting.  Vital signs were stable.  She was  afebrile.  Incision was clean, dry, and intact.  Staples were removed and  patient was discharged home.   CONDITION ON DISCHARGE:  Good.   DIET:  Regular, as tolerated.   ACTIVITY:  No heavy lifting.  No driving x2 weeks.  No vaginal entry.   FOLLOWUP:  The patient is to follow up in the office in one to two weeks for  an incision check.  She is to call for temperature greater  than 100 degrees,  persistent nausea and vomiting, heavy vaginal bleeding, and/or redness or  drainage from the incisional site.   DISCHARGE MEDICATIONS:  1. Percocet 5/325 one p.o. q.4-6h. p.r.n. pain.  2.     Motrin 600 mg q.6h. p.r.n.  3. Prenatal vitamins one p.o. daily.  4. Colace one p.o. daily p.r.n.     Julio Sicks, N.P.                        Freddy Finner, M.D.    CC/MEDQ  D:  10/01/2003  T:  10/01/2003  Job:  [id number]

## 2011-05-14 NOTE — H&P (Signed)
   NAME:  Ann Frederick, Ann Frederick NO.:  [id number]   MEDICAL RECORD NO.:  [id number]                   PATIENT TYPE:   LOCATION:                                       FACILITY:   PHYSICIAN:  Tracie Harrier, M.D.              DATE OF BIRTH:  1971/11/06   DATE OF ADMISSION:  DATE OF DISCHARGE:                                HISTORY & PHYSICAL   HISTORY OF PRESENT ILLNESS:  Ms. Ann Frederick is a 40 year old female, gravida 6,  para 1, A4, at 20 and 4/7ths weeks gestation.  The patient is admitted for  repeat cesarean section.  Her pregnancy has been followed closely, and has  been relatively uneventful.   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  She also had a positive group B strep vaginal colonization  cultures done August 27, 2003.   OBSTETRICAL LABORATORIES:  Maternal blood type A positive, rubella immune,  Glucola normal, positive group B strep.   MEDICAL HISTORY:  1. History of uterine fibroids.  2. History of HSV.   SURGICAL HISTORY:  Cesarean section in 1998.   OBSTETRICAL HISTORY:  1. SAB x3.  2. TAB x1.  3. 1998 - a primary cesarean section at term for CPD, a female weighing 6     pounds, 10 ounces.   CURRENT MEDICATIONS:  Prenatal vitamins.   ALLERGIES:  None known.   PHYSICAL EXAMINATION:  VITAL SIGNS:  Stable.  Temperature 98, blood pressure  110/72, fetal heart tones present in the 140's.  GENERAL:  She is a well-developed, well-nourished, gravid female in no acute  distress.  HEENT:  Within normal limits.  NECK:  Supple without adenopathy or thyromegaly.  HEART:  Regular rate and rhythm without murmurs, gallops or rubs.  LUNGS:  Clear to auscultation.  BREAST EXAM:  Deferred, but has been normal within the last year.  ABDOMEN:  Gravid and nontender.  EXTREMITIES:  Neurologically grossly normal.  PELVIC EXAM:  Deferred.   ADMITTING DIAGNOSIS:  1. Intrauterine pregnancy at 32 and 4/7ths weeks.  2. Repeat cesarean section.   PLAN:  Repeat low transverse cesarean section.   DISCUSSION:  The risks and benefits of this surgery discussed with patient.  Questions were answered regarding repeat cesarean section.  The risk of  bleeding, infection, risk of injury to surrounding organs was reviewed.  She  declined bilateral tubal ligation.                                               Tracie Harrier, M.D.    REG/MEDQ  D:  09/11/2003  T:  09/12/2003  Job:  [id number]

## 2011-05-26 ENCOUNTER — Encounter: Payer: Self-pay | Admitting: Gastroenterology

## 2011-05-26 ENCOUNTER — Ambulatory Visit (INDEPENDENT_AMBULATORY_CARE_PROVIDER_SITE_OTHER): Payer: 59 | Admitting: Gastroenterology

## 2011-05-26 VITALS — BP 124/62 | HR 88 | Ht 62.0 in | Wt 169.0 lb

## 2011-05-26 DIAGNOSIS — R1013 Epigastric pain: Secondary | ICD-10-CM

## 2011-05-26 DIAGNOSIS — K219 Gastro-esophageal reflux disease without esophagitis: Secondary | ICD-10-CM

## 2011-05-26 DIAGNOSIS — K3189 Other diseases of stomach and duodenum: Secondary | ICD-10-CM

## 2011-05-26 NOTE — Progress Notes (Signed)
HPI: This is a  very pleasant 40 year old woman who just graduated from nursing school. 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 She has had chronic "sensitive stomach" since she was very young. She described this as an uncomfortable feeling in her epigastrium. Eating usually helps her symptoms. She has hydrocephalus and has been on proton pump inhibitor for many years. This was recently increased to twice daily after she discussed her epigastric discomfort with her primary care physician. This discomfort is actually more of a left chest discomfort, burning. Does not cause nausea or radiate. She has not been on any antibiotics recently. She has a chronic globus full sensation in her right throat. She tells me she had a barium swallow many years ago and it was negative.    Review of systems: Pertinent positive and negative review of systems were noted in the above HPI section.  All other review of systems was otherwise negative.   Past Medical History, Past Surgical History, Family History, Social History, Current Medications, Allergies were all reviewed with the patient via [facility] electronic medical record system.   Physical Exam: BP 124/62  Pulse 88  Ht 5\' 2"  (1.575 m)  Wt 169 lb (76.658 kg)  BMI 30.91 kg/m2 Constitutional: generally well-appearing Psychiatric: alert and oriented x3 Eyes: extraocular movements intact Mouth: oral pharynx moist, no lesions Neck: supple no lymphadenopathy Cardiovascular: heart regular rate and rhythm Lungs: clear to auscultation bilaterally Abdomen: soft, nontender, nondistended, no obvious ascites, no peritoneal signs, normal bowel sounds Extremities: no lower extremity edema bilaterally Skin: no lesions on visible extremities    Assessment and plan: 40 y.o. female with pyrosis, left chest burning, globus  I suspect that at least some of her symptoms are GERD related. She is not taking proton pump inhibitor at the right time  in relation to being and so she will change that. I think once daily Protonix should be fine.  She has been given a GERD handout and we will proceed with EGD at her soonest convenience.

## 2011-05-26 NOTE — Patient Instructions (Addendum)
You will be set up for an upper endoscopy. GERD handout given You should change the way you are taking your antiacid medicine (orotonix) so that you are taking it 20-30 minutes prior to a decent meal as that is the way the pill is designed to work most effectively. Gastroesophageal Reflux Disease (GERD) Your caregiver has diagnosed your chest discomfort as caused by gastroesophageal reflux disease (GERD). GERD is caused by a reflux of acid from your stomach into the digestive tube between your mouth and stomach (esophagus). Acid in contact with the esophagus causes soreness (inflammation) resulting in heartburn or chest pain. It may cause small holes in the lining of the esophagus (ulcers). CAUSES  Increased body weight puts pressure on the stomach, making acid rise.   Smoking increases acid production.   Alcohol decreases pressure on the valve between the stomach and esophagus (lower esophageal sphincter), allowing acid from the stomach into the esophagus.   Late evening meals and a full stomach increase pressure and acid production.   Lower esophageal sphincter is malformed.   Sometimes, no reason is found.  HOME CARE INSTRUCTIONS  Change the factors that you can control. Weight, smoking, or alcohol changes may be difficult to change on your own. Your caregiver can provide guidance and medical therapy.   Raising the head of your bed may help you to sleep.   Over-the-counter medicines will decrease acid production. Your caregiver can also prescribe medicines for this. Only take over-the-counter or prescription medicines for pain, discomfort, or fever as directed by your caregiver.   1/2 to 1 teaspoon of an antacid taken every hour while awake, with meals, and at bedtime, can neutralize acid.   DO NOT take aspirin, ibuprofen, or other nonsteroidal anti-inflammatory drugs (NSAIDs).  SEEK IMMEDIATE MEDICAL CARE IF:  The pain changes in location (radiates into arms, neck, jaw, teeth, or  back), intensity, or duration.   You start feeling sick to your stomach (nauseous), start throwing up (vomiting), or sweating (diaphoresis).   You develop left arm or jaw pain.   You develop pain going into your back, shortness of breath, or you pass out.   There is vomiting of fluid that is green, yellow, or looks like coffee grounds or blood.  These symptoms could signal other problems, such as heart disease. MAKE SURE YOU:  Understand these instructions.   Will watch your condition.   Will get help right away if you are not doing well or get worse.  Document Released: 09/22/2005 Document Re-Released: 03/09/2010 [facility] Patient Information 2011 [place], [place].

## 2011-05-27 ENCOUNTER — Telehealth: Payer: Self-pay | Admitting: Gastroenterology

## 2011-05-27 NOTE — Telephone Encounter (Signed)
No charge, thanks 

## 2011-05-28 ENCOUNTER — Other Ambulatory Visit: Payer: 59 | Admitting: Gastroenterology

## 2011-06-14 ENCOUNTER — Other Ambulatory Visit: Payer: 59 | Admitting: Gastroenterology

## 2011-06-21 ENCOUNTER — Other Ambulatory Visit: Payer: 59 | Admitting: Gastroenterology

## 2011-07-07 ENCOUNTER — Encounter: Payer: Self-pay | Admitting: Gastroenterology

## 2011-07-07 ENCOUNTER — Ambulatory Visit (AMBULATORY_SURGERY_CENTER): Payer: 59 | Admitting: Gastroenterology

## 2011-07-07 DIAGNOSIS — K3189 Other diseases of stomach and duodenum: Secondary | ICD-10-CM

## 2011-07-07 DIAGNOSIS — K219 Gastro-esophageal reflux disease without esophagitis: Secondary | ICD-10-CM

## 2011-07-07 DIAGNOSIS — R1013 Epigastric pain: Secondary | ICD-10-CM

## 2011-07-07 MED ORDER — SODIUM CHLORIDE 0.9 % IV SOLN: 500.0000 mL | INTRAVENOUS | Status: DC

## 2011-07-07 NOTE — Patient Instructions (Signed)
Please refer to blue and green discharge instruction sheets. 

## 2011-07-08 ENCOUNTER — Telehealth: Payer: Self-pay | Admitting: *Deleted

## 2011-07-08 NOTE — Telephone Encounter (Signed)

## 2011-07-22 ENCOUNTER — Encounter: Payer: Self-pay | Admitting: Family Medicine

## 2011-08-13 ENCOUNTER — Ambulatory Visit (INDEPENDENT_AMBULATORY_CARE_PROVIDER_SITE_OTHER): Payer: 59 | Admitting: Family Medicine

## 2011-08-13 ENCOUNTER — Encounter: Payer: Self-pay | Admitting: Family Medicine

## 2011-08-13 ENCOUNTER — Telehealth: Payer: Self-pay | Admitting: *Deleted

## 2011-08-13 VITALS — BP 114/64 | HR 72 | Temp 98.3°F | Wt 165.0 lb

## 2011-08-13 DIAGNOSIS — R599 Enlarged lymph nodes, unspecified: Secondary | ICD-10-CM

## 2011-08-13 DIAGNOSIS — R590 Localized enlarged lymph nodes: Secondary | ICD-10-CM

## 2011-08-13 DIAGNOSIS — R109 Unspecified abdominal pain: Secondary | ICD-10-CM | POA: Insufficient documentation

## 2011-08-13 LAB — POCT URINALYSIS DIPSTICK
Bilirubin, UA: NEGATIVE
Ketones, UA: NEGATIVE
Leukocytes, UA: NEGATIVE
Spec Grav, UA: 1.03

## 2011-08-13 NOTE — Assessment & Plan Note (Signed)
New, 2d duration. Localized LAD, no evidence of other infection, no other LAD. Monitor for now.  Treat with NSAID or tylenol and warm compresses. Red flags to return discussed. Advised if still present in 4 wks, to return for further evaluation.

## 2011-08-13 NOTE — Progress Notes (Signed)
  Subjective:    Patient ID: Ann Frederick, female    DOB: [birth year], 40 y.o.   MRN: [id number]  HPI CC: L flank pain  3d h/o L flank pain.  Started dull pain in flank, no radiation.  Staying constant in back.  Noticed swollen gland left inguinal region on same day.  Last time had pain in flank, had kidney infection.  Denies dysuria, urgency, frequency, hematuria.  No fevers/chills, abd pain, vomiting, blood in stool.  Tends to stay nauseated but not new.  Tends to alternate constipation with looser stools.  No vag discharge.  No skin infections in lower extremities.  No recent viral illnesses.  Monogamous with husband, no new partners.  EGD 3 wks ago - told normal.  Had this for GERD.  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  Pap smear next month. UTD PPD at work.  Review of Systems Per HPI    Objective:   Physical Exam  Nursing note and vitals reviewed. Constitutional: She appears well-developed and well-nourished. No distress.  HENT:  Head: Normocephalic and atraumatic.  Mouth/Throat: Oropharynx is clear and moist. No oropharyngeal exudate.  Eyes: Conjunctivae and EOM are normal. Pupils are equal, round, and reactive to light. No scleral icterus.  Neck: Normal range of motion. Neck supple.  Cardiovascular: Normal rate, regular rhythm, normal heart sounds and intact distal pulses.   No murmur heard. Pulmonary/Chest: Effort normal and breath sounds normal. No respiratory distress. She has no wheezes. She has no rales.  Abdominal: Soft. Bowel sounds are normal. She exhibits no distension, no ascites and no mass. There is no hepatosplenomegaly. There is no tenderness. There is CVA tenderness (minimal L sided). There is no rebound and no guarding. No hernia.  Lymphadenopathy:    She has no cervical adenopathy.       Right: No inguinal adenopathy present.       Left: Inguinal (~1.5cm, tender, mobile) adenopathy present.  Skin: Skin is warm and  dry. No rash noted.       No infection on legs, feet clear  Psychiatric: She has a normal mood and affect.          Assessment & Plan:

## 2011-08-13 NOTE — Telephone Encounter (Signed)
I need some help.  This patient called in this a.m stating that since Wed she has had abdominal pain and how has a swollen lymph node in her groin area.  Dr. Reece Agar has an appt at 4:15 but we hesitated to put that in at 4:15.  Is that going to be okay or shall we try to schedule her for the Sat clinic.  Dr. Milinda Antis (PCP) has an appt for Monday but we didn't know if we should have her wait that long.  I am sending this to Selena Batten, Dr. Reece Agar and Dr. Milinda Antis.

## 2011-08-13 NOTE — Patient Instructions (Signed)
Urine looking normal today.  I don't think this is kidney infection or stone. Treat with anti inflammatory and warm compresses for swollen gland. We will monitor swollen gland for now as well as flank pain.  Please return if still present after 3-4 weeks. If fever, enlarging or worsening pain, or new symptoms or new swollen glands, return sooner. Good to see you today, call us with questions.

## 2011-08-13 NOTE — Telephone Encounter (Signed)
4:15 with Dr Reece Agar sounds fine to me - I am not here this afternoon  Thanks

## 2011-08-13 NOTE — Telephone Encounter (Signed)
Dr. Reece Agar said he would see her at 4:15.

## 2011-08-13 NOTE — Assessment & Plan Note (Signed)
UA normal, r/o pyelo.  Not consistent with kidney stone. Possible pulled muscle, treat with tylenol or NSAIDs.   Return sooner if changing or worsening. Concentrated - advised to drink more water.

## 2011-08-13 NOTE — Telephone Encounter (Signed)
Pt is already scheduled to see Dr Reece Agar this afternoon at 4:15pm.

## 2011-12-28 DIAGNOSIS — Z9289 Personal history of other medical treatment: Secondary | ICD-10-CM

## 2011-12-28 HISTORY — DX: Personal history of other medical treatment: Z92.89

## 2012-03-03 ENCOUNTER — Ambulatory Visit (INDEPENDENT_AMBULATORY_CARE_PROVIDER_SITE_OTHER): Payer: 59 | Admitting: Family

## 2012-03-03 ENCOUNTER — Encounter: Payer: Self-pay | Admitting: Family

## 2012-03-03 DIAGNOSIS — J029 Acute pharyngitis, unspecified: Secondary | ICD-10-CM

## 2012-03-03 NOTE — Progress Notes (Signed)
Addended by: Mervin Kung A on: 03/03/2012 02:34 PM   Modules accepted: Orders

## 2012-03-03 NOTE — Patient Instructions (Signed)
You may use tylenol as needed for pain or salt water gargles. Call if symptoms worsen or if no improvement in next 2-3 days.

## 2012-03-03 NOTE — Progress Notes (Signed)
  Subjective:    Patient ID: Ann Frederick, female    DOB: 1971/04/02, 41 y.o.   MRN: [id number]  HPI  Ann Frederick is a 41 yr old female who has been followed at [facility], who presents today with chief complaint of sore throat.  She now lives closer to this location and would like to establish care here. She is [redacted] weeks pregnant. Sore throat started Wednesday.  She reports that her son has a cough.  She reports chills, but no fever.  Sore throat 6/10.  Has not used any meds.    Review of Systems See HPI  Past Medical History  Diagnosis Date  . Acne   . Heart murmur     ? heart murmur in past  . GERD (gastroesophageal reflux disease)     History   Social History  . Marital Status: Married    Spouse Name: N/A    Number of Children: 2  . Years of Education: N/A   Occupational History  . Not on file.   Social History Main Topics  . Smoking status: Never Smoker   . Smokeless tobacco: Never Used  . Alcohol Use: Yes     1 glass of wine per week  . Drug Use: No  . Sexually Active: Not on file   Other Topics Concern  . Not on file   Social History Narrative  . No narrative on file    Past Surgical History  Procedure Date  . Cesarean section     x 2  . Esophagogastroduodenoscopy normal     7/12    Family History  Problem Relation Age of Onset  . Depression Mother   . Hypertension Mother   . Diabetes Mother   . Obesity Mother   . Thyroid disease Mother   . Cancer Father     ? lung CA  . Kidney disease Father   . Hydrocephalus Sister   . Colon cancer Neg Hx     No Known Allergies  Current Outpatient Prescriptions on File Prior to Visit  Medication Sig Dispense Refill  . aspirin 81 MG tablet Take 81 mg by mouth daily.        . clindamycin (CLINDAGEL) 1 % gel Use as directed.         BP 102/74  Pulse 82  Temp(Src) 98.1 F (36.7 C) (Oral)  Resp 16  Wt 166 lb 1.9 oz (75.352 kg)  SpO2 98%  LMP 04/08/2011       Objective:   Physical Exam    Constitutional: She appears well-developed and well-nourished.  HENT:  Right Ear: Tympanic membrane and ear canal normal.  Left Ear: Tympanic membrane and ear canal normal.       Mild erythema of throat without exudates or edema.   Cardiovascular: Normal rate and regular rhythm.   No murmur heard. Pulmonary/Chest: Effort normal and breath sounds normal. No respiratory distress. She has no wheezes. She has no rales. She exhibits no tenderness.  Musculoskeletal: She exhibits no edema.  Lymphadenopathy:    She has cervical adenopathy.  Psychiatric: She has a normal mood and affect. Her behavior is normal. Judgment and thought content normal.          Assessment & Plan:

## 2012-03-03 NOTE — Assessment & Plan Note (Signed)
41 yr old pregnant female with viral pharyngitis.  Recommended salt water gargles/prn tylenol. Rapid strep is neg, but will send to lab to be sure.

## 2012-03-05 ENCOUNTER — Telehealth: Payer: Self-pay | Admitting: Family

## 2012-03-05 NOTE — Telephone Encounter (Signed)
Pls call pt and let her know that strep screen is negative.

## 2012-03-06 ENCOUNTER — Encounter: Payer: Self-pay | Admitting: Family

## 2012-03-06 ENCOUNTER — Ambulatory Visit (INDEPENDENT_AMBULATORY_CARE_PROVIDER_SITE_OTHER): Payer: 59 | Admitting: Family

## 2012-03-06 DIAGNOSIS — J029 Acute pharyngitis, unspecified: Secondary | ICD-10-CM

## 2012-03-06 MED ORDER — AMOXICILLIN 500 MG PO CAPS: 500.0000 mg | ORAL_CAPSULE | Freq: Two times a day (BID) | ORAL | Status: AC

## 2012-03-06 NOTE — Patient Instructions (Signed)
Please call if symptoms worsen or if not feeling better in 2-3 days.  

## 2012-03-06 NOTE — Assessment & Plan Note (Addendum)
Deteriorated. Rapid strep from lab is negative, but symptoms worse and pt is pregnant.  As strep test is not 100%, will plan to treat empirically with amoxicillin. Note provided for work.

## 2012-03-06 NOTE — Progress Notes (Signed)
  Subjective:    Patient ID: Ann Frederick, female    DOB: [birth year], 41 y.o.   MRN: [id number]  HPI  Ms.  Pinegar is a 41 yr old female who presents today with chief complaint of sore throat. She was seen on Friday with same symptoms.  Over the weekend she reports reports fever 100.3. Sore throat throat worse.  + nasal drainage and cough.    Review of Systems See HPI  Past Medical History  Diagnosis Date  . Acne   . Heart murmur     ? heart murmur in past  . GERD (gastroesophageal reflux disease)     History   Social History  . Marital Status: Married    Spouse Name: N/A    Number of Children: 2  . Years of Education: N/A   Occupational History  . Not on file.   Social History Main Topics  . Smoking status: Never Smoker   . Smokeless tobacco: Never Used  . Alcohol Use: Yes     1 glass of wine per week  . Drug Use: No  . Sexually Active: Not on file   Other Topics Concern  . Not on file   Social History Narrative  . No narrative on file    Past Surgical History  Procedure Date  . Cesarean section     x 2  . Esophagogastroduodenoscopy normal     7/12    Family History  Problem Relation Age of Onset  . Depression Mother   . Hypertension Mother   . Diabetes Mother   . Obesity Mother   . Thyroid disease Mother   . Cancer Father     ? lung CA  . Kidney disease Father   . Hydrocephalus Sister   . Colon cancer Neg Hx     No Known Allergies  Current Outpatient Prescriptions on File Prior to Visit  Medication Sig Dispense Refill  . aspirin 81 MG tablet Take 81 mg by mouth daily.        . clindamycin (CLINDAGEL) 1 % gel Use as directed.       . Prenatal Vit-Fe Fumarate-FA (PRENATAL MULTIVITAMIN) TABS Take 1 tablet by mouth daily.      . progesterone (PROMETRIUM) 200 MG capsule Take 400 mg by mouth daily.      . progesterone 200 MG SUPP Place 200 mg vaginally 2 (two) times daily.        BP 110/70  Pulse 99  Temp(Src) 98 F (36.7 C) (Oral)  Resp 16   SpO2 99%  LMP 04/08/2011       Objective:   Physical Exam  Constitutional: She appears well-developed and well-nourished. No distress.  HENT:  Right Ear: Tympanic membrane and ear canal normal.  Left Ear: Tympanic membrane and ear canal normal.  Mouth/Throat: No oropharyngeal exudate or posterior oropharyngeal edema.       Mild erythema without exudates.    Cardiovascular: Normal rate and regular rhythm.   No murmur heard. Pulmonary/Chest: Effort normal and breath sounds normal. No respiratory distress. She has no wheezes. She has no rales. She exhibits no tenderness.  Musculoskeletal: She exhibits no edema.  Lymphadenopathy:    She has no cervical adenopathy.  Psychiatric: She has a normal mood and affect. Her behavior is normal. Judgment and thought content normal.          Assessment & Plan:

## 2012-03-06 NOTE — Telephone Encounter (Signed)
Pt.notified

## 2012-03-16 ENCOUNTER — Other Ambulatory Visit: Payer: Self-pay

## 2012-04-04 ENCOUNTER — Other Ambulatory Visit (HOSPITAL_COMMUNITY): Payer: Self-pay | Admitting: Obstetrics and Gynecology

## 2012-04-04 DIAGNOSIS — O2690 Pregnancy related conditions, unspecified, unspecified trimester: Secondary | ICD-10-CM

## 2012-04-05 ENCOUNTER — Ambulatory Visit (HOSPITAL_COMMUNITY)
Admission: RE | Admit: 2012-04-05 | Discharge: 2012-04-05 | Disposition: A | Discharge status: Home / Self Care | Payer: 59 | Source: Ambulatory Visit | Attending: Obstetrics and Gynecology | Admitting: Obstetrics and Gynecology

## 2012-04-05 ENCOUNTER — Other Ambulatory Visit (HOSPITAL_COMMUNITY): Payer: Self-pay | Admitting: Obstetrics and Gynecology

## 2012-04-05 ENCOUNTER — Encounter (HOSPITAL_COMMUNITY): Payer: Self-pay

## 2012-04-05 VITALS — BP 124/75 | HR 100 | Wt 171.0 lb

## 2012-04-05 DIAGNOSIS — O3410 Maternal care for benign tumor of corpus uteri, unspecified trimester: Secondary | ICD-10-CM | POA: Insufficient documentation

## 2012-04-05 DIAGNOSIS — O3440 Maternal care for other abnormalities of cervix, unspecified trimester: Secondary | ICD-10-CM | POA: Insufficient documentation

## 2012-04-05 DIAGNOSIS — D219 Benign neoplasm of connective and other soft tissue, unspecified: Secondary | ICD-10-CM

## 2012-04-05 DIAGNOSIS — O2690 Pregnancy related conditions, unspecified, unspecified trimester: Secondary | ICD-10-CM

## 2012-04-05 DIAGNOSIS — O34219 Maternal care for unspecified type scar from previous cesarean delivery: Secondary | ICD-10-CM | POA: Insufficient documentation

## 2012-04-05 DIAGNOSIS — O09529 Supervision of elderly multigravida, unspecified trimester: Secondary | ICD-10-CM

## 2012-04-05 NOTE — Progress Notes (Signed)
Obstetric ultrasound performed today.   Transvaginal imaging was performed and modest cervical shortening was noted with a measurement of 2.5cm.  No funnelling or dynamic change was noted.  Cervical length measurements are techinically challenging at this gestational age and normal appearance and measurements are poorly established.  The clinical significance of this finding is uncertain.    Two large uterine fibroids are noted as described above.  These do not appear to have a significant impingement on the uterine cavity.    Some views suggest an inferior location of the gestational sac within the uterine cavity with some increased heterogeneous echos at the the uterine fundus.  This may represent a a large subchorionic collection, abnormal placentation, changes related to the adjacent fibroid, or another intrauterine process.  Re evaluation is recommended.    Patient reports ongoing cramping and dark vaginal discharge.  She also describes pelvic pressure.  Given ultrasound findings and patient's symptoms, cerclage is not recommended at this time.  A continued period of observation is recommended.  If clinical symptoms resolve and follow up ultrasound does not reveal any concerns, evaluation for the possiblity of cerclage could again be undertaken.    Advanced maternal age was also reviewed today.  Patient has a first trimester screen that is currently pending.  The options of diagnotic testing and evalution of  maternal serum cell free fetal DNA were described.  Patient is considering these.    Repeat ultrasound scheduled in 1 week to re evaluate subchorionic hemorrhage and cervical length.  Precautions given.   Please see full report in [facility].

## 2012-04-06 ENCOUNTER — Inpatient Hospital Stay (HOSPITAL_COMMUNITY): Admission: AD | Admit: 2012-04-06 | Payer: Self-pay | Source: Ambulatory Visit | Admitting: Obstetrics and Gynecology

## 2012-04-12 ENCOUNTER — Ambulatory Visit (HOSPITAL_COMMUNITY)
Admission: RE | Admit: 2012-04-12 | Discharge: 2012-04-12 | Disposition: A | Discharge status: Home / Self Care | Payer: 59 | Source: Ambulatory Visit | Attending: Obstetrics and Gynecology | Admitting: Obstetrics and Gynecology

## 2012-04-12 ENCOUNTER — Encounter (HOSPITAL_COMMUNITY): Payer: Self-pay

## 2012-04-12 ENCOUNTER — Other Ambulatory Visit (HOSPITAL_COMMUNITY): Payer: Self-pay | Admitting: Maternal and Fetal Medicine

## 2012-04-12 DIAGNOSIS — O2690 Pregnancy related conditions, unspecified, unspecified trimester: Secondary | ICD-10-CM

## 2012-04-12 DIAGNOSIS — O09529 Supervision of elderly multigravida, unspecified trimester: Secondary | ICD-10-CM | POA: Insufficient documentation

## 2012-04-12 DIAGNOSIS — D219 Benign neoplasm of connective and other soft tissue, unspecified: Secondary | ICD-10-CM

## 2012-04-12 DIAGNOSIS — O3410 Maternal care for benign tumor of corpus uteri, unspecified trimester: Secondary | ICD-10-CM | POA: Insufficient documentation

## 2012-04-12 DIAGNOSIS — O26879 Cervical shortening, unspecified trimester: Secondary | ICD-10-CM | POA: Insufficient documentation

## 2012-04-12 DIAGNOSIS — O34219 Maternal care for unspecified type scar from previous cesarean delivery: Secondary | ICD-10-CM | POA: Insufficient documentation

## 2012-04-12 DIAGNOSIS — O3440 Maternal care for other abnormalities of cervix, unspecified trimester: Secondary | ICD-10-CM | POA: Insufficient documentation

## 2012-05-02 ENCOUNTER — Other Ambulatory Visit (HOSPITAL_COMMUNITY): Payer: Self-pay | Admitting: Obstetrics and Gynecology

## 2012-05-02 DIAGNOSIS — O09529 Supervision of elderly multigravida, unspecified trimester: Secondary | ICD-10-CM

## 2012-05-02 DIAGNOSIS — O26879 Cervical shortening, unspecified trimester: Secondary | ICD-10-CM

## 2012-05-02 DIAGNOSIS — O34219 Maternal care for unspecified type scar from previous cesarean delivery: Secondary | ICD-10-CM

## 2012-05-03 ENCOUNTER — Other Ambulatory Visit (HOSPITAL_COMMUNITY): Payer: Self-pay | Admitting: Obstetrics and Gynecology

## 2012-05-03 ENCOUNTER — Ambulatory Visit (HOSPITAL_COMMUNITY)
Admission: RE | Admit: 2012-05-03 | Discharge: 2012-05-03 | Disposition: A | Discharge status: Home / Self Care | Payer: 59 | Source: Ambulatory Visit | Attending: Obstetrics and Gynecology | Admitting: Obstetrics and Gynecology

## 2012-05-03 VITALS — BP 134/82 | HR 112 | Wt 176.0 lb

## 2012-05-03 DIAGNOSIS — O3440 Maternal care for other abnormalities of cervix, unspecified trimester: Secondary | ICD-10-CM | POA: Insufficient documentation

## 2012-05-03 DIAGNOSIS — O3410 Maternal care for benign tumor of corpus uteri, unspecified trimester: Secondary | ICD-10-CM | POA: Insufficient documentation

## 2012-05-03 DIAGNOSIS — O34219 Maternal care for unspecified type scar from previous cesarean delivery: Secondary | ICD-10-CM | POA: Insufficient documentation

## 2012-05-03 DIAGNOSIS — O26879 Cervical shortening, unspecified trimester: Secondary | ICD-10-CM | POA: Insufficient documentation

## 2012-05-03 DIAGNOSIS — O09529 Supervision of elderly multigravida, unspecified trimester: Secondary | ICD-10-CM

## 2012-05-03 NOTE — Progress Notes (Signed)
Patient seen for follow up ultrasound. See full report in [facility].  IUP at 17 weeks 4 days Measurements consistent wtih dating by LMP Large uterine fibroids as above Normal detailed anatomic fetal survey; however, limited veiws of the fetal heart were obtained. No markers associated with aneuploidy noted. Normal amniotic fluid volume The cervical length today is 3 cm without funneling or dynamic changes (TVUS) A posterior placenta previa is noted.  Recommend follow up ultrasound in 4 weeks to reevaluate the fetal heart.  Alpha Gula, MD

## 2012-05-04 NOTE — Progress Notes (Signed)
Encounter addended by: Alessandra Bevels. Chase Picket, RN on: 05/04/2012  1:58 PM<BR>     Documentation filed: Episodes

## 2012-05-31 ENCOUNTER — Encounter (HOSPITAL_COMMUNITY): Payer: Self-pay

## 2012-05-31 ENCOUNTER — Ambulatory Visit (HOSPITAL_COMMUNITY)
Admission: RE | Admit: 2012-05-31 | Discharge: 2012-05-31 | Disposition: A | Discharge status: Home / Self Care | Payer: 59 | Source: Ambulatory Visit | Attending: Obstetrics and Gynecology | Admitting: Obstetrics and Gynecology

## 2012-05-31 DIAGNOSIS — O3440 Maternal care for other abnormalities of cervix, unspecified trimester: Secondary | ICD-10-CM | POA: Insufficient documentation

## 2012-05-31 DIAGNOSIS — O09529 Supervision of elderly multigravida, unspecified trimester: Secondary | ICD-10-CM | POA: Insufficient documentation

## 2012-05-31 DIAGNOSIS — O3410 Maternal care for benign tumor of corpus uteri, unspecified trimester: Secondary | ICD-10-CM | POA: Insufficient documentation

## 2012-05-31 DIAGNOSIS — O26879 Cervical shortening, unspecified trimester: Secondary | ICD-10-CM | POA: Insufficient documentation

## 2012-05-31 DIAGNOSIS — O34219 Maternal care for unspecified type scar from previous cesarean delivery: Secondary | ICD-10-CM | POA: Insufficient documentation

## 2012-05-31 NOTE — Progress Notes (Signed)
Patient seen today  for follow up ultrasound.  See full report in [facility].  Alpha Gula, MD  Single IUP at 20 6/7 weeks Normal anatomic fetal survey Interval growth is appropriate (42nd %) Normal amniotic fluid A complete placenta previa is again noted  Recommend follow up ultrasound as clinically indicated.  Recommend ultrasound at approximately 28-30 weeks to reassess placental location.

## 2012-05-31 NOTE — ED Notes (Signed)
Pt denies any problems today.   

## 2012-07-12 ENCOUNTER — Encounter: Payer: 59 | Attending: Obstetrics and Gynecology | Admitting: *Deleted

## 2012-07-12 VITALS — Ht 62.0 in | Wt 187.7 lb

## 2012-07-12 DIAGNOSIS — O99810 Abnormal glucose complicating pregnancy: Secondary | ICD-10-CM | POA: Insufficient documentation

## 2012-07-12 DIAGNOSIS — Z713 Dietary counseling and surveillance: Secondary | ICD-10-CM | POA: Insufficient documentation

## 2012-07-12 DIAGNOSIS — O24419 Gestational diabetes mellitus in pregnancy, unspecified control: Secondary | ICD-10-CM

## 2012-07-13 ENCOUNTER — Encounter: Payer: Self-pay | Admitting: *Deleted

## 2012-07-13 NOTE — Patient Instructions (Signed)
Goals:  Check glucose levels per MD as instructed  Follow Gestational Diabetes Diet as instructed  Call for follow-up as needed    

## 2012-07-13 NOTE — Progress Notes (Signed)
  Patient was seen on 07/12/12 for Gestational Diabetes self-management class at the [facility]. The following learning objectives were met by the patient during this course:   States the definition of Gestational Diabetes  States why dietary management is important in controlling blood glucose  Describes the effects each nutrient has on blood glucose levels  Demonstrates ability to create a balanced meal plan  Demonstrates carbohydrate counting   States when to check blood glucose levels  Demonstrates proper blood glucose monitoring techniques  States the effect of stress and exercise on blood glucose levels  States the importance of limiting caffeine and abstaining from alcohol and smoking  Blood glucose monitor given: One Touch Ultra Mini Self Monitoring Kit  Lot # [id number]  Exp: 1/14 Blood glucose reading: 149 mg/dl  Patient instructed to monitor glucose levels: FBS: 60 - <90 1 hour: <140 2 hour: <120  *Patient received handouts:  Nutrition Diabetes and Pregnancy  Carbohydrate Counting List  Patient will be seen for follow-up as needed.

## 2012-08-21 ENCOUNTER — Encounter (HOSPITAL_COMMUNITY): Payer: Self-pay | Admitting: *Deleted

## 2012-08-21 ENCOUNTER — Inpatient Hospital Stay (HOSPITAL_COMMUNITY)
Admission: AD | Admit: 2012-08-21 | Discharge: 2012-08-21 | Disposition: A | Discharge status: Hospice | Payer: 59 | Source: Ambulatory Visit | Attending: Obstetrics and Gynecology | Admitting: Obstetrics and Gynecology

## 2012-08-21 DIAGNOSIS — O26899 Other specified pregnancy related conditions, unspecified trimester: Secondary | ICD-10-CM

## 2012-08-21 DIAGNOSIS — O479 False labor, unspecified: Secondary | ICD-10-CM

## 2012-08-21 DIAGNOSIS — R109 Unspecified abdominal pain: Secondary | ICD-10-CM

## 2012-08-21 HISTORY — DX: Anemia, unspecified: D64.9

## 2012-08-21 NOTE — MAU Note (Signed)
Pt had appt in office this am, was instructed to come to MAU for further monitoring.  States she has pain in her lower abd and her cervix in 1.5cm

## 2012-08-21 NOTE — MAU Note (Signed)
Was at regular appt today, everything was fine. Cramping in lower abd. Has gest diab, recently started  On glyburide.  Times of dizziness.

## 2012-08-21 NOTE — MAU Provider Note (Signed)
History     CSN: [id number]  Arrival date and time: 08/21/12 1637   First Provider Initiated Contact with Patient 08/21/12 1754      No chief complaint on file.  HPI Ann Frederick is 41 y.o. [phone] [redacted]w[redacted]d weeks presenting with lower abdominal cramping "needing to push".  Denies vaginal bleeding or leaking of fluid.  + fetal movement.  States they measured cervical length in the office today and it was 1.5cm.    Past Medical History  Diagnosis Date  . Acne   . Heart murmur     ? heart murmur in past  . GERD (gastroesophageal reflux disease)   . Diabetes mellitus   . Anemia     Past Surgical History  Procedure Date  . Cesarean section     x 2  . Esophagogastroduodenoscopy normal     7/12    Family History  Problem Relation Age of Onset  . Depression Mother   . Hypertension Mother   . Diabetes Mother   . Obesity Mother   . Thyroid disease Mother   . Cancer Father     ? lung CA  . Kidney disease Father   . Hydrocephalus Sister   . Colon cancer Neg Hx     History  Substance Use Topics  . Smoking status: Never Smoker   . Smokeless tobacco: Never Used  . Alcohol Use: Yes     1 glass of wine per week prior to preg    Allergies: No Known Allergies  Prescriptions prior to admission  Medication Sig Dispense Refill  . clindamycin (CLINDAGEL) 1 % gel Apply 1 application topically as directed. Use as directed.      . ferrous sulfate 325 (65 FE) MG tablet Take 325 mg by mouth daily with breakfast.      . glyBURIDE (DIABETA) 2.5 MG tablet Take 2.5 mg by mouth 2 (two) times daily.      . Prenatal Vit-Fe Fumarate-FA (PRENATAL MULTIVITAMIN) TABS Take 1 tablet by mouth daily.        Review of Systems  Gastrointestinal: Positive for abdominal pain (occ cramping denies contractions).  Genitourinary:       Negative for vaginal bleeding   Physical Exam   Blood pressure 139/76, pulse 98, temperature 97.7 F (36.5 C), temperature source Oral, resp. rate 20, last  menstrual period 01/06/2012.  Physical Exam  Constitutional: She is oriented to person, place, and time. She appears well-developed and well-nourished.  HENT:  Head: Normocephalic.  Genitourinary:       Not done--FFN planned for the am and Dr. Arelia Sneddon examined today in office  Neurological: She is alert and oriented to person, place, and time.  Skin: Skin is warm and dry.  Psychiatric: She has a normal mood and affect. Her behavior is normal.    MAU Course  Procedures   FMS_  Baseline 145, moderate variability.  Reactive.  No contractions seen MDM 18:50  Reported FMS findings to Dr. Arelia Sneddon.   After monitoring for a reasonable time, if no contractions may send patient home. No need to recheck cervix if not contracting per Dr. Arelia Sneddon. He reported she has a placenta previa with the cord between the placenta and the head.   Followup is scheduled tomorrow am for Fetal Fibronectin and reevaluation of cervical length.  Assessment and Plan  A:  Abdominal cramping at [redacted]w[redacted]d with known shortened cervix  P:  Pelvic rest     Keep appointment for re-evaluation in the office for  tomorrow am.  Matt Holmes 08/21/2012, 5:59 PM

## 2012-08-23 ENCOUNTER — Inpatient Hospital Stay (HOSPITAL_COMMUNITY)
Admission: AD | Admit: 2012-08-23 | Discharge: 2012-08-23 | Disposition: A | Discharge status: Home / Self Care | Payer: 59 | Source: Ambulatory Visit | Attending: Obstetrics and Gynecology | Admitting: Obstetrics and Gynecology

## 2012-08-23 ENCOUNTER — Other Ambulatory Visit: Payer: Self-pay | Admitting: Obstetrics and Gynecology

## 2012-08-23 DIAGNOSIS — O4700 False labor before 37 completed weeks of gestation, unspecified trimester: Secondary | ICD-10-CM | POA: Insufficient documentation

## 2012-08-23 MED ORDER — BETAMETHASONE SOD PHOS & ACET 6 (3-3) MG/ML IJ SUSP
12.0000 mg | INTRAMUSCULAR | Status: DC
  Administered 2012-08-23: 12 mg via INTRAMUSCULAR
  Filled 2012-08-23 (×2): qty 2

## 2012-08-23 MED ORDER — BETAMETHASONE SOD PHOS & ACET 6 (3-3) MG/ML IJ SUSP
12.0000 mg | Freq: Once | INTRAMUSCULAR | Status: DC
  Filled 2012-08-23: qty 2

## 2012-08-24 ENCOUNTER — Inpatient Hospital Stay (HOSPITAL_COMMUNITY)
Admission: AD | Admit: 2012-08-24 | Discharge: 2012-08-24 | Disposition: A | Discharge status: Home / Self Care | Payer: 59 | Source: Ambulatory Visit | Attending: Obstetrics and Gynecology | Admitting: Obstetrics and Gynecology

## 2012-08-24 DIAGNOSIS — O4700 False labor before 37 completed weeks of gestation, unspecified trimester: Secondary | ICD-10-CM | POA: Insufficient documentation

## 2012-08-24 MED ORDER — BETAMETHASONE SOD PHOS & ACET 6 (3-3) MG/ML IJ SUSP
12.0000 mg | Freq: Once | INTRAMUSCULAR | Status: AC
  Administered 2012-08-24: 12 mg via INTRAMUSCULAR
  Filled 2012-08-24: qty 2

## 2012-09-06 NOTE — Pre-Procedure Instructions (Signed)
Patient special requests for circulator,scrub and anes given to [facility]

## 2012-09-13 ENCOUNTER — Encounter (HOSPITAL_COMMUNITY): Payer: Self-pay | Admitting: Pharmacist

## 2012-09-18 ENCOUNTER — Encounter (HOSPITAL_COMMUNITY)
Admission: RE | Admit: 2012-09-18 | Discharge: 2012-09-18 | Disposition: A | Discharge status: Home / Self Care | Payer: 59 | Source: Ambulatory Visit | Attending: Obstetrics and Gynecology | Admitting: Obstetrics and Gynecology

## 2012-09-18 ENCOUNTER — Encounter (HOSPITAL_COMMUNITY): Payer: Self-pay

## 2012-09-18 LAB — CBC
HCT: 32.9 % — ABNORMAL LOW (ref 36.0–46.0)
Hemoglobin: 10.5 g/dL — ABNORMAL LOW (ref 12.0–15.0)
MCH: 27.6 pg (ref 26.0–34.0)
MCHC: 31.9 g/dL (ref 30.0–36.0)
MCV: 86.4 fL (ref 78.0–100.0)
Platelets: 223 10*3/uL (ref 150–400)
RBC: 3.81 MIL/uL — ABNORMAL LOW (ref 3.87–5.11)
RDW: 17.9 % — ABNORMAL HIGH (ref 11.5–15.5)
WBC: 12.1 10*3/uL — ABNORMAL HIGH (ref 4.0–10.5)

## 2012-09-18 LAB — ABO/RH: ABO/RH(D): A POS

## 2012-09-18 LAB — SURGICAL PCR SCREEN
MRSA, PCR: NEGATIVE
Staphylococcus aureus: NEGATIVE

## 2012-09-18 LAB — TYPE AND SCREEN
ABO/RH(D): A POS
Antibody Screen: NEGATIVE

## 2012-09-18 LAB — OB RESULTS CONSOLE ABO/RH: RH Type: POSITIVE

## 2012-09-18 LAB — RPR: RPR Ser Ql: NONREACTIVE

## 2012-09-18 NOTE — Patient Instructions (Signed)
Your procedure is scheduled on:09/29/12  Enter through the Main Entrance at :1230 pm Pick up desk phone and dial [phone] and inform us of your arrival.  Please call [phone] if you have any problems the morning of surgery.  Remember: Do not eat after midnight (:toast ok until 8am ) Do not drink after:10am on Friday- clear liquids  Take these meds the morning of surgery with a sip of water:none  DO NOT wear jewelry, eye make-up, lipstick,body lotion, or dark fingernail polish. Do not shave for 48 hours prior to surgery.  If you are to be admitted after surgery, leave suitcase in car until your room has been assigned. Patients discharged on the day of surgery will not be allowed to drive home.   Remember to use your Hibiclens as instructed.

## 2012-09-28 MED ORDER — DEXTROSE 5 % IV SOLN
2.0000 g | INTRAVENOUS | Status: AC
  Administered 2012-09-29: 2 g via INTRAVENOUS
  Filled 2012-09-28: qty 2

## 2012-09-28 NOTE — H&P (Addendum)
Ann Frederick is a 41 y.o. female presenting for repeat c/s.  Has had two previous C/S.  Preg was IVF and complicated by AMA with nl 1st tri screen.  Also has large 8 cm fibroid and PTL responding to BR, progesterone.  Now with regular ctxs and 9mm of cervix.  A2Gest GM with moderate control.  GBS-.  Pregnancy also complicated by Placenta previa now low lying and currently have ctxs and early labor sxs.History OB History    Grav Para Term Preterm Abortions TAB SAB Ect Mult Living   7 2 2  0 4 1 3  0 0 2     Past Medical History  Diagnosis Date  . Acne   . Diabetes mellitus     gest this pregnancy  . Heart murmur     ? heart murmur in past  . GERD (gastroesophageal reflux disease)     worse while pregnant  . Anemia    Past Surgical History  Procedure Date  . Cesarean section     x 2  . Esophagogastroduodenoscopy normal     7/12   Family History: family history includes Cancer in her father; Depression in her mother; Diabetes in her mother; Hydrocephalus in her sister; Hypertension in her mother; Kidney disease in her father; Obesity in her mother; and Thyroid disease in her mother.  There is no history of Colon cancer. Social History:  reports that she has never smoked. She has never used smokeless tobacco. She reports that she drinks alcohol. She reports that she does not use illicit drugs.   Prenatal Transfer Tool  Maternal Diabetes: Yes:  Diabetes Type:  Insulin/Medication controlled Genetic Screening: Normal Maternal Ultrasounds/Referrals: Normal Fetal Ultrasounds or other Referrals:  Referred to Materal Fetal Medicine  Maternal Substance Abuse:  No Significant Maternal Medications:  Meds include: Other: Gyburide Significant Maternal Lab Results:  None Other Comments:  None  ROS    Height 5\' 2"  (1.575 m), weight 86.183 kg (190 lb), last menstrual period 01/06/2012. Exam Physical Exam  Cx Cl/90%/-3 Prenatal labs: ABO, Rh: --/--/A POS (09/23 1020) Antibody: NEG (09/23  1017) Rubella: Immune (03/12 0000) RPR: NON REACTIVE (09/23 1017)  HBsAg: Negative (03/12 0000)  HIV: Non-reactive (03/12 0000)  GBS:     Assessment/Plan: IUP at 38 weeks Prev C/S x 2 with early labor and cervical change Marginal placenta - previous complete previa And early labor sxs for repeat C/S A2DM with moderate control Followed by MFM with rec to Deliver at 38 weeks  Tevita Gomer C 09/28/2012, 6:24 PM    09/29/12 1400Risks and benefits of C/S were discussed.  All questions were answered and informed consent was obtained.  Plan to proceed with low segment transverse Cesarean Section. This patient has been seen and examined.   All of her questions were answered.  Labs and vital signs reviewed.  Informed consent has been obtained.  The History and Physical is current.  DL

## 2012-09-29 ENCOUNTER — Inpatient Hospital Stay (HOSPITAL_COMMUNITY): Payer: 59

## 2012-09-29 ENCOUNTER — Inpatient Hospital Stay (HOSPITAL_COMMUNITY)
Admission: AD | Admit: 2012-09-29 | Discharge: 2012-10-02 | DRG: 765 | Disposition: A | Discharge status: Home / Self Care | Payer: 59 | Source: Ambulatory Visit | Attending: Obstetrics and Gynecology | Admitting: Obstetrics and Gynecology

## 2012-09-29 ENCOUNTER — Encounter (HOSPITAL_COMMUNITY): Payer: Self-pay | Admitting: *Deleted

## 2012-09-29 ENCOUNTER — Encounter (HOSPITAL_COMMUNITY)
Admission: AD | Disposition: A | Discharge status: Home / Self Care | Payer: Self-pay | Source: Ambulatory Visit | Attending: Obstetrics and Gynecology

## 2012-09-29 ENCOUNTER — Encounter (HOSPITAL_COMMUNITY): Payer: Self-pay

## 2012-09-29 DIAGNOSIS — L708 Other acne: Secondary | ICD-10-CM

## 2012-09-29 DIAGNOSIS — D259 Leiomyoma of uterus, unspecified: Secondary | ICD-10-CM | POA: Diagnosis present

## 2012-09-29 DIAGNOSIS — O34599 Maternal care for other abnormalities of gravid uterus, unspecified trimester: Secondary | ICD-10-CM | POA: Diagnosis present

## 2012-09-29 DIAGNOSIS — O2432 Unspecified pre-existing diabetes mellitus in childbirth: Secondary | ICD-10-CM | POA: Diagnosis present

## 2012-09-29 DIAGNOSIS — D4959 Neoplasm of unspecified behavior of other genitourinary organ: Secondary | ICD-10-CM | POA: Diagnosis present

## 2012-09-29 DIAGNOSIS — O09529 Supervision of elderly multigravida, unspecified trimester: Secondary | ICD-10-CM | POA: Diagnosis present

## 2012-09-29 DIAGNOSIS — O9903 Anemia complicating the puerperium: Secondary | ICD-10-CM | POA: Diagnosis not present

## 2012-09-29 DIAGNOSIS — D819 Combined immunodeficiency, unspecified: Secondary | ICD-10-CM

## 2012-09-29 DIAGNOSIS — R011 Cardiac murmur, unspecified: Secondary | ICD-10-CM

## 2012-09-29 DIAGNOSIS — R590 Localized enlarged lymph nodes: Secondary | ICD-10-CM

## 2012-09-29 DIAGNOSIS — M25559 Pain in unspecified hip: Secondary | ICD-10-CM

## 2012-09-29 DIAGNOSIS — R229 Localized swelling, mass and lump, unspecified: Secondary | ICD-10-CM

## 2012-09-29 DIAGNOSIS — J029 Acute pharyngitis, unspecified: Secondary | ICD-10-CM

## 2012-09-29 DIAGNOSIS — R109 Unspecified abdominal pain: Secondary | ICD-10-CM

## 2012-09-29 DIAGNOSIS — K219 Gastro-esophageal reflux disease without esophagitis: Secondary | ICD-10-CM

## 2012-09-29 DIAGNOSIS — E119 Type 2 diabetes mellitus without complications: Secondary | ICD-10-CM | POA: Diagnosis present

## 2012-09-29 DIAGNOSIS — R5381 Other malaise: Secondary | ICD-10-CM

## 2012-09-29 DIAGNOSIS — D649 Anemia, unspecified: Secondary | ICD-10-CM | POA: Diagnosis not present

## 2012-09-29 DIAGNOSIS — M5430 Sciatica, unspecified side: Secondary | ICD-10-CM

## 2012-09-29 DIAGNOSIS — M79609 Pain in unspecified limb: Secondary | ICD-10-CM

## 2012-09-29 LAB — GLUCOSE, CAPILLARY: Glucose-Capillary: 83 mg/dL (ref 70–99)

## 2012-09-29 LAB — CBC
HCT: 26.8 % — ABNORMAL LOW (ref 36.0–46.0)
MCHC: 33.6 g/dL (ref 30.0–36.0)
MCV: 86.7 fL (ref 78.0–100.0)
Platelets: 183 10*3/uL (ref 150–400)
RBC: 3.09 MIL/uL — ABNORMAL LOW (ref 3.87–5.11)
WBC: 21.7 10*3/uL — ABNORMAL HIGH (ref 4.0–10.5)

## 2012-09-29 SURGERY — Surgical Case
Anesthesia: Spinal | Site: Abdomen | Wound class: Clean Contaminated

## 2012-09-29 MED ORDER — OXYTOCIN 40 UNITS IN LACTATED RINGERS INFUSION - SIMPLE MED: 62.5000 mL/h | INTRAVENOUS | Status: AC

## 2012-09-29 MED ORDER — DIBUCAINE 1 % RE OINT: 1.0000 "application " | TOPICAL_OINTMENT | RECTAL | Status: DC | PRN

## 2012-09-29 MED ORDER — OXYCODONE-ACETAMINOPHEN 5-325 MG PO TABS
1.0000 | ORAL_TABLET | ORAL | Status: DC | PRN
  Administered 2012-09-30 (×2): 2 via ORAL
  Administered 2012-09-30: 1 via ORAL
  Administered 2012-09-30: 2 via ORAL
  Administered 2012-10-01: 1 via ORAL
  Administered 2012-10-01: 2 via ORAL
  Administered 2012-10-01: 1 via ORAL
  Administered 2012-10-01 – 2012-10-02 (×4): 2 via ORAL
  Filled 2012-09-29 (×2): qty 1
  Filled 2012-09-29 (×2): qty 2
  Filled 2012-09-29: qty 1
  Filled 2012-09-29 (×7): qty 2

## 2012-09-29 MED ORDER — NALOXONE HCL 0.4 MG/ML IJ SOLN: 0.4000 mg | INTRAMUSCULAR | Status: DC | PRN

## 2012-09-29 MED ORDER — DIPHENHYDRAMINE HCL 50 MG/ML IJ SOLN: 12.5000 mg | Freq: Four times a day (QID) | INTRAMUSCULAR | Status: DC | PRN

## 2012-09-29 MED ORDER — MEPERIDINE HCL 25 MG/ML IJ SOLN
INTRAMUSCULAR | Status: AC
  Filled 2012-09-29: qty 1

## 2012-09-29 MED ORDER — ZOLPIDEM TARTRATE 5 MG PO TABS: 5.0000 mg | ORAL_TABLET | Freq: Every evening | ORAL | Status: DC | PRN

## 2012-09-29 MED ORDER — IBUPROFEN 600 MG PO TABS
600.0000 mg | ORAL_TABLET | Freq: Four times a day (QID) | ORAL | Status: DC
  Administered 2012-09-30 – 2012-10-02 (×8): 600 mg via ORAL
  Filled 2012-09-29 (×8): qty 1

## 2012-09-29 MED ORDER — PHENYLEPHRINE 40 MCG/ML (10ML) SYRINGE FOR IV PUSH (FOR BLOOD PRESSURE SUPPORT)
PREFILLED_SYRINGE | INTRAVENOUS | Status: AC
  Filled 2012-09-29: qty 5

## 2012-09-29 MED ORDER — DIPHENHYDRAMINE HCL 50 MG/ML IJ SOLN: 25.0000 mg | INTRAMUSCULAR | Status: DC | PRN

## 2012-09-29 MED ORDER — OXYTOCIN 10 UNIT/ML IJ SOLN
INTRAMUSCULAR | Status: AC
  Filled 2012-09-29: qty 4

## 2012-09-29 MED ORDER — FERROUS SULFATE 325 (65 FE) MG PO TABS
325.0000 mg | ORAL_TABLET | Freq: Every day | ORAL | Status: DC
  Administered 2012-10-01 – 2012-10-02 (×2): 325 mg via ORAL
  Filled 2012-09-29 (×2): qty 1

## 2012-09-29 MED ORDER — FENTANYL CITRATE 0.05 MG/ML IJ SOLN
INTRAMUSCULAR | Status: AC
  Filled 2012-09-29: qty 2

## 2012-09-29 MED ORDER — MENTHOL 3 MG MT LOZG: 1.0000 | LOZENGE | OROMUCOSAL | Status: DC | PRN

## 2012-09-29 MED ORDER — MEPERIDINE HCL 25 MG/ML IJ SOLN: 6.2500 mg | INTRAMUSCULAR | Status: DC | PRN

## 2012-09-29 MED ORDER — PRENATAL MULTIVITAMIN CH: 1.0000 | ORAL_TABLET | Freq: Every day | ORAL | Status: DC

## 2012-09-29 MED ORDER — DEXTROSE 5 % IV SOLN
INTRAVENOUS | Status: DC | PRN
  Administered 2012-09-29: 14:00:00 via INTRAVENOUS

## 2012-09-29 MED ORDER — WITCH HAZEL-GLYCERIN EX PADS: 1.0000 "application " | MEDICATED_PAD | CUTANEOUS | Status: DC | PRN

## 2012-09-29 MED ORDER — INFLUENZA VIRUS VACC SPLIT PF IM SUSP
0.5000 mL | INTRAMUSCULAR | Status: AC
  Administered 2012-10-01: 0.5 mL via INTRAMUSCULAR

## 2012-09-29 MED ORDER — NALBUPHINE HCL 10 MG/ML IJ SOLN
5.0000 mg | INTRAMUSCULAR | Status: DC | PRN
Start: 2012-09-29 — End: 2012-10-02

## 2012-09-29 MED ORDER — SODIUM CHLORIDE 0.9 % IJ SOLN: 3.0000 mL | INTRAMUSCULAR | Status: DC | PRN

## 2012-09-29 MED ORDER — CHLOROPROCAINE HCL 3 % IJ SOLN
INTRAMUSCULAR | Status: AC
  Filled 2012-09-29: qty 20

## 2012-09-29 MED ORDER — SODIUM CHLORIDE 0.9 % IV SOLN: 1.0000 ug/kg/h | INTRAVENOUS | Status: DC | PRN

## 2012-09-29 MED ORDER — KETOROLAC TROMETHAMINE 30 MG/ML IJ SOLN: 30.0000 mg | Freq: Four times a day (QID) | INTRAMUSCULAR | Status: DC | PRN

## 2012-09-29 MED ORDER — SCOPOLAMINE 1 MG/3DAYS TD PT72: 1.0000 | MEDICATED_PATCH | Freq: Once | TRANSDERMAL | Status: DC

## 2012-09-29 MED ORDER — LANOLIN HYDROUS EX OINT: 1.0000 "application " | TOPICAL_OINTMENT | CUTANEOUS | Status: DC | PRN

## 2012-09-29 MED ORDER — DIPHENHYDRAMINE HCL 25 MG PO CAPS
25.0000 mg | ORAL_CAPSULE | ORAL | Status: DC | PRN
  Filled 2012-09-29: qty 1

## 2012-09-29 MED ORDER — ONDANSETRON HCL 4 MG/2ML IJ SOLN
INTRAMUSCULAR | Status: AC
  Filled 2012-09-29: qty 2

## 2012-09-29 MED ORDER — METOCLOPRAMIDE HCL 5 MG/ML IJ SOLN: 10.0000 mg | Freq: Three times a day (TID) | INTRAMUSCULAR | Status: DC | PRN

## 2012-09-29 MED ORDER — ONDANSETRON HCL 4 MG/2ML IJ SOLN: 4.0000 mg | Freq: Three times a day (TID) | INTRAMUSCULAR | Status: DC | PRN

## 2012-09-29 MED ORDER — ONDANSETRON HCL 4 MG PO TABS: 4.0000 mg | ORAL_TABLET | ORAL | Status: DC | PRN

## 2012-09-29 MED ORDER — LACTATED RINGERS IV SOLN: INTRAVENOUS | Status: DC

## 2012-09-29 MED ORDER — MORPHINE SULFATE (PF) 0.5 MG/ML IJ SOLN
INTRAMUSCULAR | Status: DC | PRN
  Administered 2012-09-29: .15 mg via INTRATHECAL

## 2012-09-29 MED ORDER — EPHEDRINE SULFATE 50 MG/ML IJ SOLN
INTRAMUSCULAR | Status: DC | PRN
  Administered 2012-09-29 (×4): 10 mg via INTRAVENOUS

## 2012-09-29 MED ORDER — DIPHENHYDRAMINE HCL 50 MG/ML IJ SOLN: 12.5000 mg | INTRAMUSCULAR | Status: DC | PRN

## 2012-09-29 MED ORDER — METHYLERGONOVINE MALEATE 0.2 MG/ML IJ SOLN
INTRAMUSCULAR | Status: DC | PRN
  Administered 2012-09-29: 0.2 mg via INTRAMUSCULAR

## 2012-09-29 MED ORDER — LACTATED RINGERS IV SOLN
INTRAVENOUS | Status: DC | PRN
  Administered 2012-09-29: 15:00:00 via INTRAVENOUS

## 2012-09-29 MED ORDER — LIDOCAINE HCL (PF) 1 % IJ SOLN
INTRAMUSCULAR | Status: AC
  Filled 2012-09-29: qty 5

## 2012-09-29 MED ORDER — SIMETHICONE 80 MG PO CHEW: 80.0000 mg | CHEWABLE_TABLET | ORAL | Status: DC | PRN

## 2012-09-29 MED ORDER — MORPHINE SULFATE 0.5 MG/ML IJ SOLN
INTRAMUSCULAR | Status: AC
  Filled 2012-09-29: qty 10

## 2012-09-29 MED ORDER — SODIUM CHLORIDE 0.9 % IJ SOLN: 9.0000 mL | INTRAMUSCULAR | Status: DC | PRN

## 2012-09-29 MED ORDER — IBUPROFEN 600 MG PO TABS: 600.0000 mg | ORAL_TABLET | Freq: Four times a day (QID) | ORAL | Status: DC | PRN

## 2012-09-29 MED ORDER — ONDANSETRON HCL 4 MG/2ML IJ SOLN: 4.0000 mg | INTRAMUSCULAR | Status: DC | PRN

## 2012-09-29 MED ORDER — LACTATED RINGERS IV SOLN
INTRAVENOUS | Status: DC
  Administered 2012-09-29 (×4): via INTRAVENOUS

## 2012-09-29 MED ORDER — ONDANSETRON HCL 4 MG/2ML IJ SOLN: 4.0000 mg | Freq: Four times a day (QID) | INTRAMUSCULAR | Status: DC | PRN

## 2012-09-29 MED ORDER — PRENATAL MULTIVITAMIN CH
1.0000 | ORAL_TABLET | Freq: Every day | ORAL | Status: DC
  Administered 2012-09-30 – 2012-10-02 (×2): 1 via ORAL
  Filled 2012-09-29 (×3): qty 1

## 2012-09-29 MED ORDER — OXYTOCIN 40 UNITS IN LACTATED RINGERS INFUSION - SIMPLE MED
INTRAVENOUS | Status: DC | PRN
  Administered 2012-09-29: 40 [IU] via INTRAVENOUS

## 2012-09-29 MED ORDER — HYDROMORPHONE HCL PF 1 MG/ML IJ SOLN
0.2500 mg | INTRAMUSCULAR | Status: DC | PRN
  Administered 2012-09-29 (×4): 0.5 mg via INTRAVENOUS

## 2012-09-29 MED ORDER — HYDROMORPHONE HCL PF 1 MG/ML IJ SOLN
INTRAMUSCULAR | Status: AC
  Administered 2012-09-29: 0.5 mg via INTRAVENOUS
  Filled 2012-09-29: qty 1

## 2012-09-29 MED ORDER — CEFAZOLIN SODIUM-DEXTROSE 2-3 GM-% IV SOLR
INTRAVENOUS | Status: AC
  Filled 2012-09-29: qty 50

## 2012-09-29 MED ORDER — FENTANYL CITRATE 0.05 MG/ML IJ SOLN
INTRAMUSCULAR | Status: DC | PRN
  Administered 2012-09-29 (×2): 25 ug via INTRAVENOUS
  Administered 2012-09-29: 50 ug via INTRAVENOUS
  Administered 2012-09-29: 25 ug via INTRATHECAL

## 2012-09-29 MED ORDER — SODIUM CHLORIDE 0.9 % IV SOLN
INTRAVENOUS | Status: DC | PRN
  Administered 2012-09-29: 15:00:00 via INTRAVENOUS

## 2012-09-29 MED ORDER — KETOROLAC TROMETHAMINE 60 MG/2ML IM SOLN: 60.0000 mg | Freq: Once | INTRAMUSCULAR | Status: DC | PRN

## 2012-09-29 MED ORDER — PHENYLEPHRINE HCL 10 MG/ML IJ SOLN
INTRAMUSCULAR | Status: DC | PRN
  Administered 2012-09-29: 40 ug via INTRAVENOUS
  Administered 2012-09-29 (×3): 80 ug via INTRAVENOUS
  Administered 2012-09-29: 40 ug via INTRAVENOUS
  Administered 2012-09-29 (×2): 80 ug via INTRAVENOUS
  Administered 2012-09-29 (×3): 40 ug via INTRAVENOUS

## 2012-09-29 MED ORDER — SCOPOLAMINE 1 MG/3DAYS TD PT72
MEDICATED_PATCH | TRANSDERMAL | Status: AC
  Administered 2012-09-29: 1.5 mg via TRANSDERMAL
  Filled 2012-09-29: qty 1

## 2012-09-29 MED ORDER — NALBUPHINE HCL 10 MG/ML IJ SOLN: 5.0000 mg | INTRAMUSCULAR | Status: DC | PRN

## 2012-09-29 MED ORDER — METHYLERGONOVINE MALEATE 0.2 MG/ML IJ SOLN
INTRAMUSCULAR | Status: AC
  Filled 2012-09-29: qty 1

## 2012-09-29 MED ORDER — SIMETHICONE 80 MG PO CHEW
80.0000 mg | CHEWABLE_TABLET | Freq: Three times a day (TID) | ORAL | Status: DC
  Administered 2012-09-29 – 2012-10-02 (×7): 80 mg via ORAL

## 2012-09-29 MED ORDER — SENNOSIDES-DOCUSATE SODIUM 8.6-50 MG PO TABS
2.0000 | ORAL_TABLET | Freq: Every day | ORAL | Status: DC
  Administered 2012-09-29 – 2012-10-01 (×3): 2 via ORAL

## 2012-09-29 MED ORDER — HYDROMORPHONE 0.3 MG/ML IV SOLN
INTRAVENOUS | Status: DC
  Administered 2012-09-29: 19:00:00 via INTRAVENOUS
  Administered 2012-09-30: 6 mg via INTRAVENOUS
  Administered 2012-09-30: 0.2 mg via INTRAVENOUS
  Administered 2012-09-30: 6.67 mg via INTRAVENOUS
  Filled 2012-09-29: qty 25

## 2012-09-29 MED ORDER — TETANUS-DIPHTH-ACELL PERTUSSIS 5-2.5-18.5 LF-MCG/0.5 IM SUSP: 0.5000 mL | Freq: Once | INTRAMUSCULAR | Status: DC

## 2012-09-29 MED ORDER — ONDANSETRON HCL 4 MG/2ML IJ SOLN
INTRAMUSCULAR | Status: DC | PRN
  Administered 2012-09-29: 4 mg via INTRAVENOUS

## 2012-09-29 MED ORDER — EPHEDRINE 5 MG/ML INJ
INTRAVENOUS | Status: AC
  Filled 2012-09-29: qty 10

## 2012-09-29 MED ORDER — DIPHENHYDRAMINE HCL 12.5 MG/5ML PO ELIX
12.5000 mg | ORAL_SOLUTION | Freq: Four times a day (QID) | ORAL | Status: DC | PRN
  Filled 2012-09-29: qty 5

## 2012-09-29 MED ORDER — CHLOROPROCAINE HCL 3 % IJ SOLN
INTRAMUSCULAR | Status: DC | PRN
  Administered 2012-09-29: 20 mL

## 2012-09-29 MED ORDER — SCOPOLAMINE 1 MG/3DAYS TD PT72
1.0000 | MEDICATED_PATCH | Freq: Once | TRANSDERMAL | Status: DC
  Administered 2012-09-29: 1.5 mg via TRANSDERMAL

## 2012-09-29 MED ORDER — DIPHENHYDRAMINE HCL 25 MG PO CAPS: 25.0000 mg | ORAL_CAPSULE | Freq: Four times a day (QID) | ORAL | Status: DC | PRN

## 2012-09-29 SURGICAL SUPPLY — 36 items
CLOTH BEACON ORANGE TIMEOUT ST (SAFETY) ×2 IMPLANT
DRAPE SURG 17X23 STRL (DRAPES) ×2 IMPLANT
DRESSING TELFA 8X3 (GAUZE/BANDAGES/DRESSINGS) IMPLANT
DRSG COVADERM 4X10 (GAUZE/BANDAGES/DRESSINGS) ×1 IMPLANT
DURAPREP 26ML APPLICATOR (WOUND CARE) ×2 IMPLANT
ELECT REM PT RETURN 9FT ADLT (ELECTROSURGICAL) ×2
ELECTRODE REM PT RTRN 9FT ADLT (ELECTROSURGICAL) ×1 IMPLANT
EXTRACTOR VACUUM M CUP 4 TUBE (SUCTIONS) IMPLANT
GAUZE SPONGE 4X4 12PLY STRL LF (GAUZE/BANDAGES/DRESSINGS) IMPLANT
GLOVE BIO SURGEON STRL SZ7 (GLOVE) ×1 IMPLANT
GLOVE BIO SURGEON STRL SZ8 (GLOVE) ×2 IMPLANT
GLOVE BIOGEL PI IND STRL 6.5 (GLOVE) IMPLANT
GLOVE BIOGEL PI INDICATOR 6.5 (GLOVE) ×4
GLOVE ECLIPSE 6.0 STRL STRAW (GLOVE) ×3 IMPLANT
GLOVE SURG ORTHO 8.0 STRL STRW (GLOVE) ×2 IMPLANT
GOWN PREVENTION PLUS LG XLONG (DISPOSABLE) ×4 IMPLANT
GOWN STRL REIN XL XLG (GOWN DISPOSABLE) ×3 IMPLANT
KIT ABG SYR 3ML LUER SLIP (SYRINGE) ×2 IMPLANT
NDL HYPO 25X5/8 SAFETYGLIDE (NEEDLE) ×1 IMPLANT
NEEDLE HYPO 25X5/8 SAFETYGLIDE (NEEDLE) ×2 IMPLANT
NS IRRIG 1000ML POUR BTL (IV SOLUTION) ×2 IMPLANT
PACK C SECTION WH (CUSTOM PROCEDURE TRAY) ×2 IMPLANT
PAD ABD 7.5X8 STRL (GAUZE/BANDAGES/DRESSINGS) IMPLANT
PAD OB MATERNITY 4.3X12.25 (PERSONAL CARE ITEMS) IMPLANT
SLEEVE SCD COMPRESS KNEE MED (MISCELLANEOUS) IMPLANT
SPONGE LAP 18X18 X RAY DECT (DISPOSABLE) ×3 IMPLANT
STAPLER VISISTAT 35W (STAPLE) IMPLANT
SUT MNCRL 0 VIOLET CTX 36 (SUTURE) ×3 IMPLANT
SUT MONOCRYL 0 CTX 36 (SUTURE) ×5
SUT PDS AB 1 CT  36 (SUTURE)
SUT PDS AB 1 CT 36 (SUTURE) IMPLANT
SUT VIC AB 1 CTX 36 (SUTURE)
SUT VIC AB 1 CTX36XBRD ANBCTRL (SUTURE) IMPLANT
TOWEL OR 17X24 6PK STRL BLUE (TOWEL DISPOSABLE) ×4 IMPLANT
TRAY FOLEY CATH 14FR (SET/KITS/TRAYS/PACK) ×2 IMPLANT
WATER STERILE IRR 1000ML POUR (IV SOLUTION) ×2 IMPLANT

## 2012-09-29 NOTE — Transfer of Care (Signed)
Immediate Anesthesia Transfer of Care Note  Patient: Ann Frederick  Procedure(s) Performed: Procedure(s) (LRB) with comments: CESAREAN SECTION (N/A) - Repeat edc 10/12/12/REQUEST;Chassity,Dee,Colleen  Patient Location: PACU  Anesthesia Type: Spinal  Level of Consciousness: awake, alert  and oriented  Airway & Oxygen Therapy: Patient Spontanous Breathing  Post-op Assessment: Report given to PACU RN and Post -op Vital signs reviewed and stable  Post vital signs: Reviewed and stable  Complications: No apparent anesthesia complications

## 2012-09-29 NOTE — OR Nursing (Signed)
Dr. Vincente Poli notified at 1450 per Dr. Vance Gather request by Rutherford Guys RN.  Dr. Vincente Poli arrived to OR room at 1501.

## 2012-09-29 NOTE — Anesthesia Procedure Notes (Addendum)
Anesthesia Regional Block:   Narrative:    Anesthesia Regional Block:   Narrative:    Spinal  Patient location during procedure: OR Preanesthetic Checklist Completed: patient identified, site marked, surgical consent, pre-op evaluation, timeout performed, IV checked, risks and benefits discussed and monitors and equipment checked Spinal Block Patient position: sitting Prep: DuraPrep Patient monitoring: heart rate, cardiac monitor, continuous pulse ox and blood pressure Approach: midline Location: L3-4 Injection technique: single-shot Needle Needle type: Sprotte  Needle gauge: 24 G Needle length: 9 cm Assessment Sensory level: T4 Additional Notes Spinal Dosage in OR  Bupivicaine ml       1.3 PFMS04   mcg        150 Fentanyl mcg            25

## 2012-09-29 NOTE — Brief Op Note (Signed)
09/29/2012  3:37 PM  PATIENT:  Ann Frederick  41 y.o. female  PRE-OPERATIVE DIAGNOSIS:  2 previous; marginal previa; funic presentation,early labor   POST-OPERATIVE DIAGNOSIS:  2 previous; marginal previa; funic presentation, placenta acreta  PROCEDURE:  Procedure(s) (LRB) with comments: CESAREAN SECTION (N/A) - Repeat edc 10/12/12/REQUEST;Chassity,Dee,Colleen  SURGEON:  Surgeon(s) and Role:    * Turner Daniels, MD - Primary    * Jeani Hawking, MD - Assisting  PHYSICIAN ASSISTANT:   ASSISTANTS: grewal   ANESTHESIA:   spinal  EBL:  Total I/O In: 4100 [I.V.:3750; Blood:350] Out: 2600 [Urine:200; Blood:2400]  BLOOD ADMINISTERED:350 CC PRBC  DRAINS: Urinary Catheter (Foley)   LOCAL MEDICATIONS USED:  NONE and OTHER nesicaine intrapertoneal  SPECIMEN:  Source of Specimen:  placenta, and uterine wall resection  DISPOSITION OF SPECIMEN:  PATHOLOGY  COUNTS:  YES  TOURNIQUET:  * No tourniquets in log *  DICTATION: .Other Dictation: Dictation Number   PLAN OF CARE: Admit to inpatient   PATIENT DISPOSITION:  PACU - hemodynamically stable.   Delay start of Pharmacological VTE agent (>24hrs) due to surgical blood loss or risk of bleeding: no

## 2012-09-29 NOTE — Anesthesia Preprocedure Evaluation (Signed)
Anesthesia Evaluation    Airway       Dental   Pulmonary          Cardiovascular     Neuro/Psych    GI/Hepatic GERD-  ,  Endo/Other  diabetes, GestationalMorbid obesity  Renal/GU      Musculoskeletal   Abdominal   Peds  Hematology   Anesthesia Other Findings   Reproductive/Obstetrics                           Anesthesia Physical Anesthesia Plan  ASA: III  Anesthesia Plan: Spinal   Post-op Pain Management:    Induction:   Airway Management Planned:   Additional Equipment:   Intra-op Plan:   Post-operative Plan:   Informed Consent: I have reviewed the patients History and Physical, chart, labs and discussed the procedure including the risks, benefits and alternatives for the proposed anesthesia with the patient or authorized representative who has indicated his/her understanding and acceptance.   Dental Advisory Given  Plan Discussed with: CRNA  Anesthesia Plan Comments: (Lab work confirmed with CRNA in room. Platelets okay. Discussed spinal anesthetic, and patient consents to the procedure:  included risk of possible headache,backache, failed block, allergic reaction, and nerve injury. This patient was asked if she had any questions or concerns before the procedure started. )        Anesthesia Quick Evaluation

## 2012-09-29 NOTE — Anesthesia Postprocedure Evaluation (Signed)
  Anesthesia Post-op Note  Patient: Ann Frederick  Procedure(s) Performed: Procedure(s) (LRB) with comments: CESAREAN SECTION (N/A) - Repeat edc 10/12/12/REQUEST;Chassity,Dee,Colleen   Patient is awake, responsive, moving her legs, and has signs of resolution of her numbness. Pain and nausea are reasonably well controlled. Vital signs are stable and clinically acceptable. Oxygen saturation is clinically acceptable. There are no apparent anesthetic complications at this time. Patient is ready for discharge.

## 2012-09-30 LAB — CBC
MCV: 85.9 fL (ref 78.0–100.0)
Platelets: 173 10*3/uL (ref 150–400)
RDW: 16.9 % — ABNORMAL HIGH (ref 11.5–15.5)
WBC: 16.4 10*3/uL — ABNORMAL HIGH (ref 4.0–10.5)

## 2012-09-30 MED ORDER — HYDROMORPHONE HCL 2 MG PO TABS
ORAL_TABLET | ORAL | Status: AC
  Administered 2012-09-30: 2 mg via ORAL
  Filled 2012-09-30: qty 1

## 2012-09-30 MED ORDER — HYDROMORPHONE HCL 2 MG PO TABS
2.0000 mg | ORAL_TABLET | ORAL | Status: DC | PRN
  Administered 2012-09-30 – 2012-10-01 (×2): 2 mg via ORAL
  Filled 2012-09-30: qty 1

## 2012-09-30 MED ORDER — FERUMOXYTOL INJECTION 510 MG/17 ML
510.0000 mg | Freq: Once | INTRAVENOUS | Status: AC
  Administered 2012-09-30: 510 mg via INTRAVENOUS
  Filled 2012-09-30: qty 17

## 2012-09-30 NOTE — Progress Notes (Signed)
Assisted pt up to BR. No c/o dizziness, N/V, or feeling faint. Ambulated to BR and back standby assist. Vitals WNL. NRH

## 2012-09-30 NOTE — Anesthesia Postprocedure Evaluation (Signed)
Anesthesia Post Note  Patient: Ann Frederick  Procedure(s) Performed: Procedure(s) (LRB): CESAREAN SECTION (N/A)  Anesthesia type: SAB  Patient location: Mother/Baby  Post pain: Pain level controlled  Post assessment: Post-op Vital signs reviewed  Last Vitals:  Filed Vitals:   09/30/12 1200  BP: 111/59  Pulse: 79  Temp: 37.1 C  Resp: 20    Post vital signs: Reviewed  Level of consciousness: awake  Complications: No apparent anesthesia complications

## 2012-09-30 NOTE — Progress Notes (Signed)
Subjective: Postpartum Day 1 Cesarean Delivery Patient reports incisional pain and tolerating PO.    Objective: Vital signs in last 24 hours: Temp:  [97.4 F (36.3 C)-99 F (37.2 C)] 98.1 F (36.7 C) (10/05 0528) Pulse Rate:  [74-107] 87  (10/05 0528) Resp:  [16-22] 18  (10/05 0528) BP: (98-126)/(53-97) 107/66 mmHg (10/05 0528) SpO2:  [96 %-100 %] 98 % (10/05 0528)  Physical Exam:  General: alert, cooperative and appears stated age Lochia: appropriate Uterine Fundus: firm Incision: healing well, no significant drainage, no dehiscence, no significant erythema DVT Evaluation: No evidence of DVT seen on physical exam.   Basename 09/30/12 0600 09/29/12 1717  HGB 7.5* 9.0*  HCT 22.6* 26.8*    Assessment/Plan: Status post Cesarean section. Doing well postoperatively.  Continue current care. Hemoglobin 7.5 - will administer feraheme today Check cbc tomorrow Keaunna Skipper L 09/30/2012, 6:53 AM

## 2012-09-30 NOTE — Op Note (Signed)
Ann Frederick, Ann NO.:  [id number]  MEDICAL RECORD NO.:  [id number]  LOCATION:  9133                          FACILITY:  [facility]  PHYSICIAN:  Dineen Kid. Rana Snare, M.D.    DATE OF BIRTH:  04-Jul-1971  DATE OF PROCEDURE:  09/29/2012 DATE OF DISCHARGE:                              OPERATIVE REPORT   PREOPERATIVE DIAGNOSES: 1. Intrauterine pregnancy at [redacted] weeks gestational age. 2. Previous cesarean section x2. 3. Growth for terminal age. 4. Gestational diabetes. 5. Low lying placenta. 6. Low grade labor.  POSTOPERATIVE DIAGNOSES: 1. Intrauterine pregnancy at [redacted] weeks gestational age. 2. Previous cesarean section x2. 3. Growth for terminal age. 4. Gestational diabetes. 5. Low lying placenta. 6. Low grade labor. 7. Placenta accreta.  SURGEON:  Dineen Kid. Rana Snare, M.D.  ASSISTANT:  Stann Mainland. Vincente Poli, M.D.  ANESTHESIA:  Spinal.  INDICATIONS:  Wurtz is a 41 year old (being complicated by in vitro fertilization, large fibroids, previous placenta previa, now with marginal previa, gestational diabetes with glyburide controlled, preterm labor, cervix sound 9 mm and early labor symptoms), presents for repeat cesarean section.  She had two previous cesarean sections and desires repeat.  She has previously been followed by Maternal Fetal Medicine who due to the above medical problems including labor symptoms, previous cesarean sections and low lying placenta, recommended delivery at [redacted] weeks gestational age.  Risks and benefits of the procedure were discussed at length and informed consent was obtained.  FINDINGS AT THE TIME OF SURGERY:  Viable female infant.  Uterus was grossly enlarged with large fibroid approximately 8-9 cm in size. Placenta was appeared abnormal through the lower segment of the uterus consistent with placenta accreta.  DESCRIPTION OF PROCEDURE:  After adequate analgesia, the patient was placed in the supine position with left lateral tilt.  She was  sterilely prepped and draped.  Foley catheter had been sterilely placed by the nurse.  The patient can getting 2 of cefepime.  A Pfannenstiel skin incision was made 2 fingerbreadths above the pubic symphysis taken down sharply to the fascia, which was incised transversely, extended superiorly and inferiorly off the bellies of rectus muscle.  The rectus muscle entered sharply in the midline, peritoneum was entered sharply. Bladder flap was created and the patient tolerated the bladder blade. In those segment, a myotomy incision was made down to the amniotic sac. Examination started with the operator's fingertips, the infant's vertex was divided with the nuchal cord x1 in a funic presentation as well. After the resection, the infant was delivered.  The cord was clamped and cut.  Cord blood has been obtained.  Attempts to remove the placenta was successful for majority of the placenta found to be double segment right side of the placenta, which was normally adhered to the lower segment of the uterus just above the myotomy incision and just the right side of the myotomy incision.  Majority of the placenta had been removed and shell out the placenta from the bulb were unsuccessful.  The Mayo scissors were used to dissect and hold her wedge section of the uterus just above the myotomy incision removing __________ with the adhered placenta just above the myotomy incision  down to the right side.  Care was taken to avoid the blood vessels, prevent the uterine artery and also care was taken to remove the bladder away from the incision as well.  As a result, all the muscle that had placenta attached were removed.  The incision had been closed with the 0 Monocryl suture in running locking fashion.  I then excised the area of the incision, that was closed with good approximation and good achieved.  Now, the same incision was then imbricated with 0 Monocryl suture with good imbrication noted.  At this  point, anesthesia had began two large bore IVs and began giving the patient 1 unit of packed red blood cells due to the blood loss.  The uterus had been removed with vigorous massage and also Methergine, began having good response at this point.  The uterus at this time, we had good approximation and good hemostasis of the myotomy incision.  Hemostasis was achieved and care had been taken to avoid bladder injury and also injury to the blood vessels near the body of incision.  The uterus was then placed back into the abdominal cavity. After copious amount of irrigation and adequate hemostasis assured, the peritoneum was then closed with 0 Monocryl suture.  Rectus muscle was plicated in the midline.  Irrigation applied.  After adequate hemostasis, the fascia was then closed with a #1 Vicryl in a running fashion.  Irrigation was applied after adequate hemostasis.  Skin was stapled.  No Steri-Strips applied.  The patient was tolerated the procedure well, was stable transferred to the recovery room.  Sponge and instrument count was normal x3.  Estimated blood loss according to the Anesthesia was 2400 mL.  She did receive 1 unit of packed red blood cell.  Her hemoglobin previous was 10.5.  Postoperatively after 1 unit of blood was 9.     Dineen Kid Rana Snare, M.D.     DCL/MEDQ  D:  09/29/2012  T:  09/30/2012  Job:  [id number]

## 2012-10-01 LAB — CBC
MCH: 28.5 pg (ref 26.0–34.0)
MCHC: 32.8 g/dL (ref 30.0–36.0)
MCV: 86.9 fL (ref 78.0–100.0)
Platelets: 170 10*3/uL (ref 150–400)
RBC: 2.21 MIL/uL — ABNORMAL LOW (ref 3.87–5.11)
RDW: 17.3 % — ABNORMAL HIGH (ref 11.5–15.5)

## 2012-10-01 NOTE — Progress Notes (Signed)
Subjective: Postpartum Day 2: Cesarean Delivery Patient reports incisional pain, tolerating PO, + flatus and no problems voiding.    Objective: Vital signs in last 24 hours: Temp:  [97.7 F (36.5 C)-98.7 F (37.1 C)] 97.7 F (36.5 C) (10/06 0543) Pulse Rate:  [79-90] 89  (10/06 0543) Resp:  [18-20] 18  (10/06 0543) BP: (99-120)/(51-62) 99/62 mmHg (10/06 0543) SpO2:  [96 %-98 %] 96 % (10/05 1445)  Physical Exam:  General: alert, cooperative and appears stated age Lochia: appropriate Uterine Fundus: firm Incision: healing well, no significant drainage, no dehiscence, no significant erythema DVT Evaluation: No evidence of DVT seen on physical exam.   Basename 09/30/12 0600 09/29/12 1717  HGB 7.5* 9.0*  HCT 22.6* 26.8*    Assessment/Plan: Status post Cesarean section. Doing well postoperatively.  Continue current care.  Tamsin Nader L 10/01/2012, 6:58 AM

## 2012-10-01 NOTE — Progress Notes (Signed)
Hemoglobin 6.3 

## 2012-10-02 ENCOUNTER — Encounter (HOSPITAL_COMMUNITY)
Admission: RE | Admit: 2012-10-02 | Discharge: 2012-10-02 | Disposition: A | Discharge status: Home / Self Care | Payer: 59 | Source: Ambulatory Visit | Attending: Obstetrics and Gynecology | Admitting: Obstetrics and Gynecology

## 2012-10-02 ENCOUNTER — Encounter (HOSPITAL_COMMUNITY): Payer: Self-pay | Admitting: Obstetrics and Gynecology

## 2012-10-02 DIAGNOSIS — O923 Agalactia: Secondary | ICD-10-CM | POA: Insufficient documentation

## 2012-10-02 LAB — CBC
HCT: 18.9 % — ABNORMAL LOW (ref 36.0–46.0)
MCHC: 32.3 g/dL (ref 30.0–36.0)
Platelets: 192 10*3/uL (ref 150–400)
RDW: 17.7 % — ABNORMAL HIGH (ref 11.5–15.5)
WBC: 14.5 10*3/uL — ABNORMAL HIGH (ref 4.0–10.5)

## 2012-10-02 LAB — TYPE AND SCREEN
Antibody Screen: NEGATIVE
Unit division: 0

## 2012-10-02 LAB — GLUCOSE, CAPILLARY: Glucose-Capillary: 83 mg/dL (ref 70–99)

## 2012-10-02 MED ORDER — IBUPROFEN 600 MG PO TABS: 600.0000 mg | ORAL_TABLET | Freq: Four times a day (QID) | ORAL | Status: DC

## 2012-10-02 MED ORDER — OXYCODONE-ACETAMINOPHEN 5-325 MG PO TABS: 1.0000 | ORAL_TABLET | ORAL | Status: DC | PRN

## 2012-10-02 NOTE — Discharge Summary (Signed)
Obstetric Discharge Summary Reason for Admission: onset of labor and cesarean section Prenatal Procedures: ultrasound Intrapartum Procedures: cesarean: low cervical, transverse Postpartum Procedures: transfusion prbc's Complications-Operative and Postpartum: placenta accreta Hemoglobin  Date Value Range Status  10/02/2012 6.1* 12.0 - 15.0 g/dL Final     REPEATED TO VERIFY     CRITICAL RESULT CALLED TO, READ BACK BY AND VERIFIED WITH:     JAMES, J. @ 0622 ON 10/02/12 BY BOVELL. A     HCT  Date Value Range Status  10/02/2012 18.9* 36.0 - 46.0 % Final    Physical Exam:  General: alert and cooperative Lochia: appropriate Uterine Fundus: firm Incision: healing well, staples left in place DVT Evaluation: No evidence of DVT seen on physical exam. Negative Homan's sign. No cords or calf tenderness.  Discharge Diagnoses: Term Pregnancy-delivered and placenta accreta with resultant anemia  Discharge Information: Date: 10/02/2012 Activity: pelvic rest Diet: routine Medications: PNV, Ibuprofen, Colace, Iron and Percocet Condition: stable Instructions: refer to practice specific booklet Discharge to: home   Newborn Data: Live born female  Birth Weight: 6 lb 10.5 oz (3020 g) APGAR: 8, 9  Home with mother.  CURTIS,CAROL G 10/02/2012, 8:28 AM

## 2012-10-02 NOTE — Plan of Care (Signed)
Problem: Discharge Progression Outcomes Goal: Remove staples per MD order Outcome: Not Applicable Date Met:  10/02/12 Staple removal in office on Wed. Oct 9th

## 2012-10-02 NOTE — Progress Notes (Signed)
Subjective: Postpartum Day 3: Cesarean Delivery Patient reports tolerating PO, + flatus and no problems voiding.  Reports some lightheadedness and dizziness. No SOB  Objective: Vital signs in last 24 hours: Temp:  [98 F (36.7 C)-98.8 F (37.1 C)] 98.8 F (37.1 C) (10/07 0539) Pulse Rate:  [94-105] 94  (10/07 0539) Resp:  [18] 18  (10/07 0539) BP: (109-113)/(59-68) 109/64 mmHg (10/07 0539)  Physical Exam:  General: alert and cooperative Lochia: appropriate Uterine Fundus: firm Incision: healing well, staples left in place DVT Evaluation: No evidence of DVT seen on physical exam. Negative Homan's sign. No cords or calf tenderness.   Basename 10/02/12 0540 10/01/12 0539  HGB 6.1* 6.3*  HCT 18.9* 19.2*    Assessment/Plan: Status post Cesarean section. Postoperative course complicated by anemia  Discharge home with standard precautions and return to clinic in 2 days.  CURTIS,CAROL G 10/02/2012, 7:57 AM

## 2012-11-02 ENCOUNTER — Encounter (HOSPITAL_COMMUNITY)
Admission: RE | Admit: 2012-11-02 | Discharge: 2012-11-02 | Disposition: A | Discharge status: Home / Self Care | Payer: 59 | Source: Ambulatory Visit | Attending: Obstetrics and Gynecology | Admitting: Obstetrics and Gynecology

## 2012-11-02 DIAGNOSIS — O923 Agalactia: Secondary | ICD-10-CM | POA: Insufficient documentation

## 2012-12-02 ENCOUNTER — Encounter (HOSPITAL_COMMUNITY)
Admission: RE | Admit: 2012-12-02 | Discharge: 2012-12-02 | Disposition: A | Discharge status: Home / Self Care | Payer: 59 | Source: Ambulatory Visit | Attending: Obstetrics and Gynecology | Admitting: Obstetrics and Gynecology

## 2012-12-02 DIAGNOSIS — O923 Agalactia: Secondary | ICD-10-CM | POA: Insufficient documentation

## 2013-01-02 ENCOUNTER — Encounter (HOSPITAL_COMMUNITY)
Admission: RE | Admit: 2013-01-02 | Discharge: 2013-01-02 | Disposition: A | Discharge status: Home / Self Care | Payer: 59 | Source: Ambulatory Visit | Attending: Obstetrics and Gynecology | Admitting: Obstetrics and Gynecology

## 2013-01-02 DIAGNOSIS — O923 Agalactia: Secondary | ICD-10-CM | POA: Insufficient documentation

## 2013-02-02 ENCOUNTER — Ambulatory Visit (INDEPENDENT_AMBULATORY_CARE_PROVIDER_SITE_OTHER): Payer: 59 | Admitting: Family Medicine

## 2013-02-02 ENCOUNTER — Encounter: Payer: Self-pay | Admitting: Family Medicine

## 2013-02-02 ENCOUNTER — Encounter (HOSPITAL_COMMUNITY)
Admission: RE | Admit: 2013-02-02 | Discharge: 2013-02-02 | Disposition: A | Discharge status: Home / Self Care | Payer: 59 | Source: Ambulatory Visit | Attending: Obstetrics and Gynecology | Admitting: Obstetrics and Gynecology

## 2013-02-02 VITALS — BP 140/80 | HR 75 | Temp 98.3°F | Ht 62.25 in | Wt 181.2 lb

## 2013-02-02 DIAGNOSIS — O923 Agalactia: Secondary | ICD-10-CM | POA: Insufficient documentation

## 2013-02-02 DIAGNOSIS — J329 Chronic sinusitis, unspecified: Secondary | ICD-10-CM

## 2013-02-02 MED ORDER — AMOXICILLIN 875 MG PO TABS: 875.0000 mg | ORAL_TABLET | Freq: Two times a day (BID) | ORAL | Status: DC

## 2013-02-02 NOTE — Patient Instructions (Addendum)
This is a frontal sinus infection Start the Amoxicillin twice daily- take w/ food Drink plenty of fluids REST! (if possible!) Hang in there!

## 2013-02-02 NOTE — Progress Notes (Signed)
  Subjective:    Patient ID: Ann Frederick, female    DOB: 04-10-1971, 42 y.o.   MRN: [id number]  HPI URI- sxs started 3 days ago.  Has had sore throat, nasal congestion, sinus pressure, PND, 'raging HA'.  + nausea, no vomiting, diarrhea.  Still nursing.  Subjective fever.  36 month old w/ ear infection.  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   Review of Systems For ROS see HPI     Objective:   Physical Exam  Vitals reviewed. Constitutional: She appears well-developed and well-nourished. No distress.  HENT:  Head: Normocephalic and atraumatic.  Right Ear: Tympanic membrane normal.  Left Ear: Tympanic membrane normal.  Nose: Mucosal edema and rhinorrhea present. Right sinus exhibits frontal sinus tenderness. Right sinus exhibits no maxillary sinus tenderness. Left sinus exhibits frontal sinus tenderness. Left sinus exhibits no maxillary sinus tenderness.  Mouth/Throat: Uvula is midline and mucous membranes are normal. Posterior oropharyngeal erythema present. No oropharyngeal exudate.  Eyes: Conjunctivae normal and EOM are normal. Pupils are equal, round, and reactive to light.  Neck: Normal range of motion. Neck supple.  Cardiovascular: Normal rate, regular rhythm and normal heart sounds.   Pulmonary/Chest: Effort normal and breath sounds normal. No respiratory distress. She has no wheezes.  Lymphadenopathy:    She has no cervical adenopathy.          Assessment & Plan:

## 2013-02-04 NOTE — Assessment & Plan Note (Signed)
New.  Pt's sxs and PE consistent w/ infxn.  Start abx.  Reviewed supportive care and red flags that should prompt return.  Pt expressed understanding and is in agreement w/ plan.  

## 2013-03-03 ENCOUNTER — Encounter (HOSPITAL_COMMUNITY)
Admission: RE | Admit: 2013-03-03 | Discharge: 2013-03-03 | Disposition: A | Discharge status: Home / Self Care | Payer: 59 | Source: Ambulatory Visit | Attending: Obstetrics and Gynecology | Admitting: Obstetrics and Gynecology

## 2013-03-03 DIAGNOSIS — O923 Agalactia: Secondary | ICD-10-CM | POA: Insufficient documentation

## 2013-04-03 ENCOUNTER — Encounter (HOSPITAL_COMMUNITY)
Admission: RE | Admit: 2013-04-03 | Discharge: 2013-04-03 | Disposition: A | Discharge status: Home / Self Care | Payer: 59 | Source: Ambulatory Visit | Attending: Obstetrics and Gynecology | Admitting: Obstetrics and Gynecology

## 2013-04-03 DIAGNOSIS — O923 Agalactia: Secondary | ICD-10-CM | POA: Insufficient documentation

## 2013-05-04 ENCOUNTER — Encounter (HOSPITAL_COMMUNITY)
Admission: RE | Admit: 2013-05-04 | Discharge: 2013-05-04 | Disposition: A | Discharge status: Home / Self Care | Payer: 59 | Source: Ambulatory Visit | Attending: Obstetrics and Gynecology | Admitting: Obstetrics and Gynecology

## 2013-05-04 DIAGNOSIS — O923 Agalactia: Secondary | ICD-10-CM | POA: Insufficient documentation

## 2013-06-04 ENCOUNTER — Encounter (HOSPITAL_COMMUNITY)
Admission: RE | Admit: 2013-06-04 | Discharge: 2013-06-04 | Disposition: A | Discharge status: Home / Self Care | Payer: 59 | Source: Ambulatory Visit | Attending: Obstetrics and Gynecology | Admitting: Obstetrics and Gynecology

## 2013-06-04 DIAGNOSIS — O923 Agalactia: Secondary | ICD-10-CM | POA: Insufficient documentation

## 2013-07-04 ENCOUNTER — Encounter (HOSPITAL_COMMUNITY)
Admission: RE | Admit: 2013-07-04 | Discharge: 2013-07-04 | Disposition: A | Discharge status: Home / Self Care | Payer: Managed Care, Other (non HMO) | Source: Ambulatory Visit | Attending: Obstetrics and Gynecology | Admitting: Obstetrics and Gynecology

## 2013-07-04 DIAGNOSIS — O923 Agalactia: Secondary | ICD-10-CM | POA: Insufficient documentation

## 2013-09-07 ENCOUNTER — Ambulatory Visit (INDEPENDENT_AMBULATORY_CARE_PROVIDER_SITE_OTHER): Payer: Managed Care, Other (non HMO) | Admitting: Family Medicine

## 2013-09-07 VITALS — HR 89 | Temp 98.4°F | Wt 183.4 lb

## 2013-09-07 DIAGNOSIS — R238 Other skin changes: Secondary | ICD-10-CM | POA: Insufficient documentation

## 2013-09-07 DIAGNOSIS — R21 Rash and other nonspecific skin eruption: Secondary | ICD-10-CM

## 2013-09-07 MED ORDER — TRIAMCINOLONE ACETONIDE 0.1 % EX OINT: TOPICAL_OINTMENT | Freq: Two times a day (BID) | CUTANEOUS | Status: DC

## 2013-09-07 NOTE — Assessment & Plan Note (Signed)
New.  Pt has hx of chicken pox but now w/ itchy vesicular rash.  Not consistent w/ scabies.  Will get titers to assess chicken pox immunity.  Triamcinolone for rash, benadryl for itching.  Pt notified to stay out of work until labs available.

## 2013-09-07 NOTE — Progress Notes (Signed)
  Subjective:    Patient ID: Ann Frederick, female    DOB: 07-30-1971, 42 y.o.   MRN: [id number]  HPI ? Chicken pox- pt reports son had Hand/Foot/Mouth 3 weeks ago.  10 days ago developed some scattered vesicles that are very itchy.  No fevers.  No one at home w/ similar.  Hx of chicken pox.  Vesicles scattered on arms bilaterally, back, abd, legs- not painful.  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   Review of Systems For ROS see HPI     Objective:   Physical Exam  Vitals reviewed. Constitutional: She appears well-developed and well-nourished. No distress.  Skin: Skin is warm and dry. Rash (scattered, vesicular rash) noted.          Assessment & Plan:

## 2013-09-07 NOTE — Patient Instructions (Addendum)
We'll notify you of your lab results and make any changes if needed Use the ointment on the itchy areas Benadryl as needed Monitor for others in the house w/ similar symptoms Call with any questions or concerns Hang in there!

## 2013-09-11 ENCOUNTER — Other Ambulatory Visit: Payer: Self-pay | Admitting: Family Medicine

## 2013-09-11 DIAGNOSIS — D819 Combined immunodeficiency, unspecified: Secondary | ICD-10-CM

## 2013-09-21 ENCOUNTER — Ambulatory Visit: Payer: Self-pay | Admitting: Family Medicine

## 2013-09-21 ENCOUNTER — Other Ambulatory Visit: Payer: 59

## 2013-09-26 ENCOUNTER — Other Ambulatory Visit: Payer: Self-pay | Admitting: General Practice

## 2013-09-26 ENCOUNTER — Other Ambulatory Visit (INDEPENDENT_AMBULATORY_CARE_PROVIDER_SITE_OTHER): Payer: Managed Care, Other (non HMO)

## 2013-09-26 DIAGNOSIS — Z0000 Encounter for general adult medical examination without abnormal findings: Secondary | ICD-10-CM

## 2013-09-26 DIAGNOSIS — D819 Combined immunodeficiency, unspecified: Secondary | ICD-10-CM

## 2013-09-26 LAB — CBC WITH DIFFERENTIAL/PLATELET
Basophils Absolute: 0.1 10*3/uL (ref 0.0–0.1)
Eosinophils Relative: 2.5 % (ref 0.0–5.0)
HCT: 35.6 % — ABNORMAL LOW (ref 36.0–46.0)
Hemoglobin: 11.6 g/dL — ABNORMAL LOW (ref 12.0–15.0)
Lymphs Abs: 2.4 10*3/uL (ref 0.7–4.0)
MCV: 80.8 fl (ref 78.0–100.0)
Monocytes Absolute: 0.4 10*3/uL (ref 0.1–1.0)
Monocytes Relative: 4.6 % (ref 3.0–12.0)
Neutro Abs: 6.2 10*3/uL (ref 1.4–7.7)
Platelets: 287 10*3/uL (ref 150.0–400.0)
RDW: 16.7 % — ABNORMAL HIGH (ref 11.5–14.6)

## 2013-09-26 LAB — BASIC METABOLIC PANEL
BUN: 7 mg/dL (ref 6–23)
Chloride: 108 mEq/L (ref 96–112)
GFR: 112.45 mL/min (ref >60.00)
Glucose, Bld: 84 mg/dL (ref 70–99)
Potassium: 3.8 mEq/L (ref 3.5–5.1)
Sodium: 137 mEq/L (ref 135–145)

## 2013-09-26 LAB — MICROALBUMIN / CREATININE URINE RATIO
Creatinine,U: 258.4 mg/dL
Microalb Creat Ratio: 0.3 mg/g (ref 0.0–30.0)

## 2013-09-26 LAB — HEPATIC FUNCTION PANEL
ALT: 18 U/L (ref 0–35)
AST: 21 U/L (ref 0–37)
Albumin: 3.8 g/dL (ref 3.5–5.2)

## 2013-09-26 LAB — LIPID PANEL
Cholesterol: 185 mg/dL (ref 0–200)
Triglycerides: 131 mg/dL (ref 0.0–149.0)

## 2013-09-27 LAB — VITAMIN D 25 HYDROXY (VIT D DEFICIENCY, FRACTURES): Vit D, 25-Hydroxy: 30 ng/mL (ref 30–89)

## 2013-09-28 ENCOUNTER — Encounter: Payer: Self-pay | Admitting: Family Medicine

## 2013-10-29 ENCOUNTER — Telehealth: Payer: Self-pay

## 2013-10-29 NOTE — Telephone Encounter (Addendum)
Left message for call back identifiable  MM[facility]--03/2011--neg Pap ? Flu ? Unable to reach pre visit

## 2013-10-30 ENCOUNTER — Ambulatory Visit (INDEPENDENT_AMBULATORY_CARE_PROVIDER_SITE_OTHER): Payer: Managed Care, Other (non HMO) | Admitting: Family Medicine

## 2013-10-30 ENCOUNTER — Encounter: Payer: Self-pay | Admitting: Family Medicine

## 2013-10-30 VITALS — BP 118/80 | HR 90 | Temp 98.1°F | Resp 16 | Ht 64.5 in | Wt 182.4 lb

## 2013-10-30 DIAGNOSIS — Z0000 Encounter for general adult medical examination without abnormal findings: Secondary | ICD-10-CM

## 2013-10-30 NOTE — Assessment & Plan Note (Signed)
Pt's PE WNL.  UTD on GYN.  Reviewed labs.  Anticipatory guidance provided.

## 2013-10-30 NOTE — Patient Instructions (Signed)
Follow up in 1 year or as needed Keep up the good work!  You look great! Consider doing FitBit or MyFitnessPal for Weight loss Call with any questions or concerns Happy Holidays!!!

## 2013-10-30 NOTE — Progress Notes (Signed)
  Subjective:    Patient ID: Ann Frederick, female    DOB: [birth year], 42 y.o.   MRN: [id number]  HPI CPE- UTD on GYN, has appt scheduled for December.  No concerns today.  Had labs- reviewed.   Review of Systems Patient reports no vision/ hearing changes, adenopathy,fever, weight change,  persistant/recurrent hoarseness , swallowing issues, chest pain, palpitations, edema, persistant/recurrent cough, hemoptysis, dyspnea (rest/exertional/paroxysmal nocturnal), gastrointestinal bleeding (melena, rectal bleeding), abdominal pain, significant heartburn, bowel changes, GU symptoms (dysuria, hematuria, incontinence), Gyn symptoms (abnormal  bleeding, pain),  syncope, focal weakness, memory loss, numbness & tingling, skin/hair/nail changes, abnormal bruising or bleeding, anxiety, or depression.     Objective:   Physical Exam General Appearance:    Alert, cooperative, no distress, appears stated age  Head:    Normocephalic, without obvious abnormality, atraumatic  Eyes:    PERRL, conjunctiva/corneas clear, EOM's intact, fundi    benign, both eyes  Ears:    Normal TM's and external ear canals, both ears  Nose:   Nares normal, septum midline, mucosa normal, no drainage    or sinus tenderness  Throat:   Lips, mucosa, and tongue normal; teeth and gums normal  Neck:   Supple, symmetrical, trachea midline, no adenopathy;    Thyroid: no enlargement/tenderness/nodules  Back:     Symmetric, no curvature, ROM normal, no CVA tenderness  Lungs:     Clear to auscultation bilaterally, respirations unlabored  Chest Wall:    No tenderness or deformity   Heart:    Regular rate and rhythm, S1 and S2 normal, no murmur, rub   or gallop  Breast Exam:    Deferred to GYN  Abdomen:     Soft, non-tender, bowel sounds active all four quadrants,    no masses, no organomegaly  Genitalia:    Deferred to GYN  Rectal:    Extremities:   Extremities normal, atraumatic, no cyanosis or edema  Pulses:   2+ and symmetric all  extremities  Skin:   Skin color, texture, turgor normal, no rashes or lesions  Lymph nodes:   Cervical, supraclavicular, and axillary nodes normal  Neurologic:   CNII-XII intact, normal strength, sensation and reflexes    throughout          Assessment & Plan:

## 2013-11-05 ENCOUNTER — Encounter: Payer: Self-pay | Admitting: Family Medicine

## 2013-12-19 ENCOUNTER — Encounter: Payer: Self-pay | Admitting: Family Medicine

## 2013-12-19 ENCOUNTER — Ambulatory Visit (INDEPENDENT_AMBULATORY_CARE_PROVIDER_SITE_OTHER): Payer: Managed Care, Other (non HMO) | Admitting: Family Medicine

## 2013-12-19 VITALS — BP 122/80 | HR 108 | Temp 98.3°F | Resp 16 | Wt 183.4 lb

## 2013-12-19 DIAGNOSIS — R072 Precordial pain: Secondary | ICD-10-CM | POA: Insufficient documentation

## 2013-12-19 DIAGNOSIS — J069 Acute upper respiratory infection, unspecified: Secondary | ICD-10-CM

## 2013-12-19 LAB — TROPONIN I: Troponin I: <0.01 ng/mL (ref <0.06)

## 2013-12-19 LAB — CK TOTAL AND CKMB (NOT AT ARMC)
CK, MB: <0.7 ng/mL (ref 0.0–5.0)
Total CK: 61 U/L (ref 7–177)

## 2013-12-19 MED ORDER — GI COCKTAIL ~~LOC~~
30.0000 mL | Freq: Once | ORAL | Status: AC
  Administered 2013-12-19: 30 mL via ORAL

## 2013-12-19 MED ORDER — OMEPRAZOLE 40 MG PO CPDR: 40.0000 mg | DELAYED_RELEASE_CAPSULE | Freq: Every day | ORAL | Status: DC

## 2013-12-19 NOTE — Assessment & Plan Note (Signed)
New.  Suspect this is due to GERD- but GI cocktail did not provide relief.  Start PPI.  Get CKMB and Troponin to r/o MI.  EKG w/ inverted Twaves in V1 and V2- this is most likely normal for age and due to breasts.  Low risk for DVT/PE- no ddimer at this time.  Will follow closely.

## 2013-12-19 NOTE — Patient Instructions (Signed)
Follow up as needed Start the Omeprazole daily for acid reflux If you again have chest pain/pressure- please go to the ER Avoid eating or drinking right before lying down Call with any questions or concerns Hang in there! Happy Holidays!

## 2013-12-19 NOTE — Progress Notes (Signed)
Pre visit review using our clinic review tool, if applicable. No additional management support is needed unless otherwise documented below in the visit note. 

## 2013-12-19 NOTE — Progress Notes (Signed)
   Subjective:    Patient ID: Ann Frederick, female    DOB: 31-Jul-1971, 42 y.o.   MRN: [id number]  HPI Chest pain- woke at 2am w/ chest pain in center of chest (7-8/10).  'gripping pain'.  Took 2 ASA.  Pain improved an hr later.  Recurred this AM.  Now a 2-3/10.  Denies GERD.  + cough but not severe.  + nausea.    URI- sxs started 2 days ago, + nasal congestion, facial pressure, PND.  No fevers.  No body aches.  Review of Systems For ROS see HPI     Objective:   Physical Exam  Vitals reviewed. Constitutional: She appears well-developed and well-nourished. No distress.  HENT:  Head: Normocephalic and atraumatic.  Right Ear: Tympanic membrane normal.  Left Ear: Tympanic membrane normal.  Nose: Mucosal edema and rhinorrhea present. Right sinus exhibits no maxillary sinus tenderness and no frontal sinus tenderness. Left sinus exhibits no maxillary sinus tenderness and no frontal sinus tenderness.  Mouth/Throat: Mucous membranes are normal. Posterior oropharyngeal erythema (w/ PND) present.  Eyes: Conjunctivae and EOM are normal. Pupils are equal, round, and reactive to light.  Neck: Normal range of motion. Neck supple.  Cardiovascular: Normal rate, regular rhythm, normal heart sounds and intact distal pulses.   No murmur heard. Pulmonary/Chest: Effort normal and breath sounds normal. No respiratory distress. She has no wheezes. She has no rales.  Abdominal: Soft. Bowel sounds are normal. She exhibits no distension. There is no tenderness. There is no rebound and no guarding.  Lymphadenopathy:    She has no cervical adenopathy.          Assessment & Plan:

## 2013-12-19 NOTE — Assessment & Plan Note (Signed)
New.  No evidence of bacterial infection.  No need for abx.  Reviewed supportive care and red flags that should prompt return.

## 2014-01-07 ENCOUNTER — Encounter: Payer: Self-pay | Admitting: Family Medicine

## 2014-06-04 ENCOUNTER — Other Ambulatory Visit: Payer: Self-pay | Admitting: Family Medicine

## 2014-06-04 NOTE — Telephone Encounter (Signed)
Med filled.  

## 2014-10-11 ENCOUNTER — Other Ambulatory Visit: Payer: Self-pay

## 2014-10-28 ENCOUNTER — Encounter: Payer: Self-pay | Admitting: Family Medicine

## 2014-10-31 ENCOUNTER — Encounter: Payer: Self-pay | Admitting: Medical

## 2014-10-31 ENCOUNTER — Ambulatory Visit (INDEPENDENT_AMBULATORY_CARE_PROVIDER_SITE_OTHER): Payer: 59 | Admitting: Medical

## 2014-10-31 VITALS — BP 124/81 | HR 71 | Temp 98.5°F | Ht 64.5 in | Wt 171.0 lb

## 2014-10-31 DIAGNOSIS — M5489 Other dorsalgia: Secondary | ICD-10-CM

## 2014-10-31 DIAGNOSIS — M549 Dorsalgia, unspecified: Secondary | ICD-10-CM

## 2014-10-31 DIAGNOSIS — M545 Low back pain, unspecified: Secondary | ICD-10-CM

## 2014-10-31 MED ORDER — KETOROLAC TROMETHAMINE 60 MG/2ML IM SOLN
60.0000 mg | Freq: Once | INTRAMUSCULAR | Status: AC
  Administered 2014-10-31: 60 mg via INTRAMUSCULAR

## 2014-10-31 MED ORDER — TRAMADOL HCL 50 MG PO TABS: 50.0000 mg | ORAL_TABLET | Freq: Four times a day (QID) | ORAL | Status: DC | PRN

## 2014-10-31 MED ORDER — DICLOFENAC SODIUM 75 MG PO TBEC: 75.0000 mg | DELAYED_RELEASE_TABLET | Freq: Two times a day (BID) | ORAL | Status: DC

## 2014-10-31 MED ORDER — CYCLOBENZAPRINE HCL 5 MG PO TABS: 5.0000 mg | ORAL_TABLET | Freq: Every day | ORAL | Status: DC

## 2014-10-31 NOTE — Progress Notes (Signed)
Subjective:    Patient ID: Ann Frederick, female    DOB: 01-10-1971, 43 y.o.   MRN: [id number]  HPI  Pt is in reporting pick up something like a suit case yesterday around noon. Points to entire lumbar area. Pt not on any pain medication. She did try motrin last night. Did not help much. Pain radiating toward her buttox. No saddle anesthesia. No leg weakness. Pain level 5. Regardless of position. No incontinence.  LMP- 10-27-2014. iud.  Past Medical History  Diagnosis Date  . Acne   . Heart murmur     ? heart murmur in past  . GERD (gastroesophageal reflux disease)     worse while pregnant  . Anemia   . Diabetes mellitus     gest this pregnancy    History   Social History  . Marital Status: Married    Spouse Name: N/A    Number of Children: 2  . Years of Education: N/A   Occupational History  . Not on file.   Social History Main Topics  . Smoking status: Never Smoker   . Smokeless tobacco: Never Used  . Alcohol Use: Yes     Comment: 1 glass of wine per week prior to preg  . Drug Use: No  . Sexual Activity: Yes   Other Topics Concern  . Not on file   Social History Narrative    Past Surgical History  Procedure Laterality Date  . Cesarean section      x 2  . Esophagogastroduodenoscopy  normal     7/12  . Cesarean section  09/29/2012    Procedure: CESAREAN SECTION;  Surgeon: Luz Lex, MD;  Location: [facility];  Service: Obstetrics;  Laterality: N/A;  Repeat edc 10/12/12/REQUEST;Chassity,Dee,Colleen    Family History  Problem Relation Age of Onset  . Depression Mother   . Hypertension Mother   . Diabetes Mother   . Obesity Mother   . Thyroid disease Mother   . Cancer Father     ? lung CA  . Kidney disease Father   . Hydrocephalus Sister   . Colon cancer Neg Hx     No Known Allergies  Current Outpatient Prescriptions on File Prior to Visit  Medication Sig Dispense Refill  . clindamycin (CLINDAGEL) 1 % gel Apply 1 application topically daily. Use  as directed.    Marland Kitchen omeprazole (PRILOSEC) 40 MG capsule TAKE 1 CAPSULE (40 MG TOTAL) BY MOUTH DAILY. 30 capsule 3  . ferrous sulfate 325 (65 FE) MG tablet Take 325 mg by mouth daily with breakfast.    . ibuprofen (ADVIL,MOTRIN) 600 MG tablet Take 1 tablet (600 mg total) by mouth every 6 (six) hours. 30 tablet 1  . oxyCODONE-acetaminophen (PERCOCET/ROXICET) 5-325 MG per tablet Take 1-2 tablets by mouth every 4 (four) hours as needed (moderate - severe pain). 30 tablet 0  . Prenatal Vit-Fe Fumarate-FA (PRENATAL MULTIVITAMIN) TABS Take 1 tablet by mouth daily.    Marland Kitchen triamcinolone ointment (KENALOG) 0.1 % Apply topically 2 (two) times daily. 90 g 1  . [DISCONTINUED] pantoprazole (PROTONIX) 40 MG tablet Take 40 mg by mouth daily before breakfast.      . [DISCONTINUED] zolpidem (AMBIEN) 10 MG tablet Take 10 mg by mouth as directed. as needed.      No current facility-administered medications on file prior to visit.    BP 124/81 mmHg  Pulse 71  Temp(Src) 98.5 F (36.9 C) (Oral)  Ht 5' 4.5" (1.638 m)  Wt 171  lb (77.565 kg)  BMI 28.91 kg/m2  SpO2 98%  LMP 10/28/2014    Review of Systems  Constitutional: Negative for fever, chills and fatigue.  HENT: Negative.   Respiratory: Negative for cough, chest tightness, shortness of breath and wheezing.   Cardiovascular: Negative for chest pain and palpitations.  Gastrointestinal: Negative.   Genitourinary: Negative.   Musculoskeletal: Positive for back pain.  Hematological: Negative for adenopathy. Does not bruise/bleed easily.       Objective:   Physical Exam  Constitutional: She is oriented to person, place, and time. She appears well-developed and well-nourished. No distress.  Neck: Normal range of motion. Neck supple. No JVD present. No tracheal deviation present. No thyromegaly present.  Pulmonary/Chest: Effort normal and breath sounds normal. No respiratory distress. She has no wheezes. She has no rales. She exhibits no tenderness.    Abdominal: Soft. Bowel sounds are normal. She exhibits no distension and no mass. There is no tenderness. There is no rebound and no guarding.  Musculoskeletal:  Back- mid lumbar spine and paraspinal tenderness. Pain on srtaight leg lift mild.    Lymphadenopathy:    She has no cervical adenopathy.  Neurological: She is alert and oriented to person, place, and time.  Lower ext- l5-s1 sensation intact. Equal and symmetric 5/5 lower ext strength bilaterally. No foot drop.  Skin: She is not diaphoretic.             Assessment & Plan:

## 2014-10-31 NOTE — Progress Notes (Signed)
Pre visit review using our clinic review tool, if applicable. No additional management support is needed unless otherwise documented below in the visit note. 

## 2014-10-31 NOTE — Patient Instructions (Addendum)
For you back pain, we gave you Toradol 60 mg im in the office. I sent in ibuprofen otc, cyclobenzaprine muscle relaxant and tramadol for pain  Your pain should gradually resolve but if it persist then would get lumbar xray on follow up. If your pain worsens with radicular features notify us.  Follow up in 7-10 days or as needed.

## 2014-11-01 DIAGNOSIS — M549 Dorsalgia, unspecified: Secondary | ICD-10-CM | POA: Insufficient documentation

## 2014-11-01 NOTE — Assessment & Plan Note (Signed)
For the back pain, we gave you Toradol 60 mg im in the office. I recommended  ibuprofen otc(since you express intolerance for stronger prescription nsaids) and I prescribed cyclobenzaprine muscle relaxant and tramadol for pain  I expect pain should gradually resolve but if it persist then would get lumbar xray on follow up. If your pain worsens with radicular features asked pt to  notify us.  Follow up in 7-10 days or as needed

## 2014-11-26 LAB — HM PAP SMEAR: HM PAP: NORMAL

## 2014-12-02 ENCOUNTER — Other Ambulatory Visit: Payer: Self-pay | Admitting: Obstetrics and Gynecology

## 2014-12-04 LAB — CYTOLOGY - PAP

## 2014-12-19 ENCOUNTER — Other Ambulatory Visit: Payer: Self-pay | Admitting: General Practice

## 2014-12-19 MED ORDER — OMEPRAZOLE 40 MG PO CPDR: DELAYED_RELEASE_CAPSULE | ORAL | Status: DC

## 2014-12-27 LAB — HM MAMMOGRAPHY: HM MAMMO: NORMAL

## 2015-02-05 ENCOUNTER — Ambulatory Visit (INDEPENDENT_AMBULATORY_CARE_PROVIDER_SITE_OTHER): Payer: Managed Care, Other (non HMO) | Admitting: Medical

## 2015-02-05 ENCOUNTER — Encounter: Payer: Self-pay | Admitting: Medical

## 2015-02-05 VITALS — BP 138/86 | HR 83 | Temp 98.2°F | Ht 64.5 in | Wt 174.4 lb

## 2015-02-05 DIAGNOSIS — R51 Headache: Secondary | ICD-10-CM

## 2015-02-05 DIAGNOSIS — J322 Chronic ethmoidal sinusitis: Secondary | ICD-10-CM | POA: Insufficient documentation

## 2015-02-05 DIAGNOSIS — J0110 Acute frontal sinusitis, unspecified: Secondary | ICD-10-CM | POA: Insufficient documentation

## 2015-02-05 DIAGNOSIS — R519 Headache, unspecified: Secondary | ICD-10-CM | POA: Insufficient documentation

## 2015-02-05 MED ORDER — AZITHROMYCIN 250 MG PO TABS
ORAL_TABLET | ORAL | Status: DC
Start: 2015-02-05 — End: 2015-02-12

## 2015-02-05 MED ORDER — KETOROLAC TROMETHAMINE 60 MG/2ML IM SOLN
60.0000 mg | Freq: Once | INTRAMUSCULAR | Status: AC
  Administered 2015-02-05: 60 mg via INTRAMUSCULAR

## 2015-02-05 MED ORDER — FLUTICASONE PROPIONATE 50 MCG/ACT NA SUSP: 2.0000 | Freq: Every day | NASAL | Status: DC

## 2015-02-05 MED ORDER — BENZONATATE 100 MG PO CAPS: 100.0000 mg | ORAL_CAPSULE | Freq: Three times a day (TID) | ORAL | Status: DC | PRN

## 2015-02-05 NOTE — Assessment & Plan Note (Signed)
And frontal region but described as level 7/10. Maybe related to sinus infection. But will give toradol im for some relief. If ha worsens or  persists with neurologic signs or symptoms then ED evaluation.

## 2015-02-05 NOTE — Assessment & Plan Note (Signed)
Your appear to have a sinus infection. I am prescribing  Azithromycin antibiotic for the infection. To help with the nasal congestion I prescribed flonase  nasal steroid. For cough if starts , I prescribed cough medicine benzonatate.  Rest, hydrate, tylenol for fever.  Follow up in 7 days or as needed.

## 2015-02-05 NOTE — Progress Notes (Signed)
Pre visit review using our clinic review tool, if applicable. No additional management support is needed unless otherwise documented below in the visit note. 

## 2015-02-05 NOTE — Progress Notes (Signed)
Subjective:    Patient ID: Ann Frederick, female    DOB: Nov 26, 1971, 44 y.o.   MRN: [id number]  HPI   Pt in states 2 wks with mild sinus pressure and nasal congestion. Pt has had pnd and itchy nose(on and off). No sneezing. Pt tried claritin recently. Tyelenol has helped some   Ha last night but points to frontal sinus region. But last night tylenol did not help much. No associated neurologic type signs or symptoms.   LMP- January 06, 2015.  Review of Systems  Constitutional: Positive for fever. Negative for chills and fatigue.       Subjective fever.  HENT: Positive for postnasal drip, rhinorrhea and sinus pressure. Negative for ear pain, nosebleeds, sneezing, sore throat and trouble swallowing.   Respiratory: Negative for cough, shortness of breath and wheezing.   Cardiovascular: Negative for chest pain and palpitations.  Gastrointestinal: Negative for abdominal pain.  Musculoskeletal: Negative for back pain.  Neurological: Positive for headaches. Negative for dizziness, tremors, syncope, facial asymmetry, speech difficulty, weakness, light-headedness and numbness.       But she points to frontal sinus area.  Hematological: Negative for adenopathy. Does not bruise/bleed easily.   Past Medical History  Diagnosis Date  . Acne   . Heart murmur     ? heart murmur in past  . GERD (gastroesophageal reflux disease)     worse while pregnant  . Anemia   . Diabetes mellitus     gest this pregnancy    History   Social History  . Marital Status: Married    Spouse Name: N/A  . Number of Children: 2  . Years of Education: N/A   Occupational History  . Not on file.   Social History Main Topics  . Smoking status: Never Smoker   . Smokeless tobacco: Never Used  . Alcohol Use: Yes     Comment: 1 glass of wine per week prior to preg  . Drug Use: No  . Sexual Activity: Yes   Other Topics Concern  . Not on file   Social History Narrative    Past Surgical History    Procedure Laterality Date  . Cesarean section      x 2  . Esophagogastroduodenoscopy  normal     7/12  . Cesarean section  09/29/2012    Procedure: CESAREAN SECTION;  Surgeon: Luz Lex, MD;  Location: [facility];  Service: Obstetrics;  Laterality: N/A;  Repeat edc 10/12/12/REQUEST;Chassity,Dee,Colleen    Family History  Problem Relation Age of Onset  . Depression Mother   . Hypertension Mother   . Diabetes Mother   . Obesity Mother   . Thyroid disease Mother   . Cancer Father     ? lung CA  . Kidney disease Father   . Hydrocephalus Sister   . Colon cancer Neg Hx     No Known Allergies  Current Outpatient Prescriptions on File Prior to Visit  Medication Sig Dispense Refill  . clindamycin (CLINDAGEL) 1 % gel Apply 1 application topically daily. Use as directed.    Marland Kitchen FLUoxetine (PROZAC) 10 MG capsule Take 10 mg by mouth at bedtime.    Marland Kitchen omeprazole (PRILOSEC) 40 MG capsule TAKE 1 CAPSULE (40 MG TOTAL) BY MOUTH DAILY. 30 capsule 3  . cyclobenzaprine (FLEXERIL) 5 MG tablet Take 1 tablet (5 mg total) by mouth at bedtime. (Patient not taking: Reported on 02/05/2015) 10 tablet 1  . diclofenac (VOLTAREN) 75 MG EC tablet Take 1 tablet (  75 mg total) by mouth 2 (two) times daily. (Patient not taking: Reported on 02/05/2015) 30 tablet 0  . ferrous sulfate 325 (65 FE) MG tablet Take 325 mg by mouth daily with breakfast.    . ibuprofen (ADVIL,MOTRIN) 600 MG tablet Take 1 tablet (600 mg total) by mouth every 6 (six) hours. (Patient not taking: Reported on 02/05/2015) 30 tablet 1  . oxyCODONE-acetaminophen (PERCOCET/ROXICET) 5-325 MG per tablet Take 1-2 tablets by mouth every 4 (four) hours as needed (moderate - severe pain). (Patient not taking: Reported on 02/05/2015) 30 tablet 0  . Prenatal Vit-Fe Fumarate-FA (PRENATAL MULTIVITAMIN) TABS Take 1 tablet by mouth daily.    . traMADol (ULTRAM) 50 MG tablet Take 1 tablet (50 mg total) by mouth every 6 (six) hours as needed for moderate pain. (Patient  not taking: Reported on 02/05/2015) 16 tablet 0  . triamcinolone ointment (KENALOG) 0.1 % Apply topically 2 (two) times daily. (Patient not taking: Reported on 02/05/2015) 90 g 1  . [DISCONTINUED] pantoprazole (PROTONIX) 40 MG tablet Take 40 mg by mouth daily before breakfast.      . [DISCONTINUED] zolpidem (AMBIEN) 10 MG tablet Take 10 mg by mouth as directed. as needed.      No current facility-administered medications on file prior to visit.    BP 138/86 mmHg  Pulse 83  Temp(Src) 98.2 F (36.8 C) (Oral)  Ht 5' 4.5" (1.638 m)  Wt 174 lb 6.4 oz (79.107 kg)  BMI 29.48 kg/m2  SpO2 95%  LMP 01/06/2015      Objective:   Physical Exam  General  Mental Status - Alert. General Appearance - Well groomed. Not in acute distress.  Skin Rashes- No Rashes.  HEENT Head- Normal. Ear Auditory Canal - Left- Normal. Right - Normal.Tympanic Membrane- Left- Normal. Right- Normal. Eye Sclera/Conjunctiva- Left- Normal. Right- Normal. Nose & Sinuses Nasal Mucosa- Left-  Boggy and Congested. Right-  Boggy and  Congested.Bilateral maxillary abut more over  frontal sinus pressure.(described as some pain around eyes at times) Mouth & Throat Lips: Upper Lip- Normal: no dryness, cracking, pallor, cyanosis, or vesicular eruption. Lower Lip-Normal: no dryness, cracking, pallor, cyanosis or vesicular eruption. Buccal Mucosa- Bilateral- No Aphthous ulcers. Oropharynx- No Discharge or Erythema. Tonsils: Characteristics- Bilateral- No Erythema or Congestion. Size/Enlargement- Bilateral- No enlargement. Discharge- bilateral-None.  Neck Neck- Supple. No Masses.   Chest and Lung Exam Auscultation: Breath Sounds:-Clear even and unlabored.  Cardiovascular Auscultation:Rythm- Regular, rate and rhythm. Murmurs & Other Heart Sounds:Ausculatation of the heart reveal- No Murmurs.  Lymphatic Head & Neck General Head & Neck Lymphatics: Bilateral: Description- No Localized  lymphadenopathy.    Neurologic Cranial Nerve exam:- CN III-XII intact(No nystagmus), symmetric smile. Drift Test:- No drift. Romberg Exam:- Negative.  Heal to Toe Gait exam:-Normal. Finger to Nose:- Normal/Intact Strength:- 5/5 equal and symmetric strength both upper and lower extremities.      Assessment & Plan:

## 2015-02-05 NOTE — Patient Instructions (Signed)
Sinusitis, acute frontal Your appear to have a sinus infection. I am prescribing  Azithromycin antibiotic for the infection. To help with the nasal congestion I prescribed flonase  nasal steroid. For cough if starts , I prescribed cough medicine benzonatate.  Rest, hydrate, tylenol for fever.  Follow up in 7 days or as needed.   Headache around the eyes And frontal region but described as level 7/10. Maybe related to sinus infection. But will give toradol im for some relief. If ha worsens or  persists with neurologic signs or symptoms then ED evaluation.     Follow up 7 days or as needed.

## 2015-02-06 NOTE — Telephone Encounter (Signed)
Will get lpn to call pt and advise stop zpack. Coulld  switch to cephalexin 500 mg #14 1 tab po bid x 7 days(Please note,  I did read her  discussion with RN. She is hesitant to start cephalexin. So let her now that is option but won't push due to her concern for c dif and she states sinus do feel better). But advise pt with her body aches, ha she might need re-evaluation. The diarrhea she had was likely antibiotic side effect. I think to early for c dif. But do start probiotics. Even with cephalexin diarrhea is possible. But may be less irritating to GI tract in general. Hydrate with [employer] water.  Go to urgent care over the weekend if worsens.

## 2015-02-06 NOTE — Telephone Encounter (Signed)
Patient called regarding nausea/vomiting.  She states it has somewhat subsided since she she stopped the z-pack (added to her allergies per patient preference).  She reports nausea, vomiting, fever/ chills for around 4-5 hours.  She states she does feel weak but not dizzy and with no fast heart rate.  She is not able to eat but has been drinking ginger-ale.   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  She denies sick contacts.  She is wondering if it would be possible to call in nausea medication (prefers not Zofran- states the dissolving makes her more nauseated?) She is hesitant to try a different antibiotic for fear of reaction and states her sinus symptoms have mostly gone away.     Should is willing to be seen again if needed.  Please advise.

## 2015-02-11 ENCOUNTER — Encounter: Payer: Self-pay | Admitting: Family Medicine

## 2015-02-11 NOTE — Telephone Encounter (Signed)
Pt called in asking to speak with Mackie Pai about issue below. Pt was very angry and stated that has called a few times and has not received a call back, she does not want to speak with nurse. Please advise.

## 2015-02-12 ENCOUNTER — Other Ambulatory Visit: Payer: Self-pay | Admitting: Family Medicine

## 2015-02-12 MED ORDER — AMOXICILLIN 875 MG PO TABS: 875.0000 mg | ORAL_TABLET | Freq: Two times a day (BID) | ORAL | Status: DC

## 2015-02-13 ENCOUNTER — Ambulatory Visit: Payer: Self-pay | Admitting: Family Medicine

## 2015-02-25 ENCOUNTER — Telehealth: Payer: Self-pay

## 2015-02-25 NOTE — Telephone Encounter (Signed)
LM for call back

## 2015-02-27 ENCOUNTER — Other Ambulatory Visit: Payer: Self-pay | Admitting: General Practice

## 2015-02-27 ENCOUNTER — Ambulatory Visit (INDEPENDENT_AMBULATORY_CARE_PROVIDER_SITE_OTHER): Payer: Managed Care, Other (non HMO) | Admitting: Family Medicine

## 2015-02-27 ENCOUNTER — Encounter: Payer: Self-pay | Admitting: Family Medicine

## 2015-02-27 VITALS — BP 122/84 | HR 78 | Temp 98.0°F | Resp 16 | Ht 62.5 in | Wt 173.0 lb

## 2015-02-27 DIAGNOSIS — Z0000 Encounter for general adult medical examination without abnormal findings: Secondary | ICD-10-CM

## 2015-02-27 LAB — HEPATIC FUNCTION PANEL
ALBUMIN: 4.1 g/dL (ref 3.5–5.2)
ALK PHOS: 53 U/L (ref 39–117)
ALT: 11 U/L (ref 0–35)
AST: 16 U/L (ref 0–37)
Bilirubin, Direct: 0.1 mg/dL (ref 0.0–0.3)
TOTAL PROTEIN: 7.1 g/dL (ref 6.0–8.3)
Total Bilirubin: 0.8 mg/dL (ref 0.2–1.2)

## 2015-02-27 LAB — CBC WITH DIFFERENTIAL/PLATELET
BASOS PCT: 0.7 % (ref 0.0–3.0)
Basophils Absolute: 0.1 10*3/uL (ref 0.0–0.1)
EOS PCT: 1.9 % (ref 0.0–5.0)
Eosinophils Absolute: 0.2 10*3/uL (ref 0.0–0.7)
HEMATOCRIT: 37.1 % (ref 36.0–46.0)
HEMOGLOBIN: 12.5 g/dL (ref 12.0–15.0)
Lymphocytes Relative: 24.4 % (ref 12.0–46.0)
Lymphs Abs: 2.3 10*3/uL (ref 0.7–4.0)
MCHC: 33.6 g/dL (ref 30.0–36.0)
MCV: 87.3 fl (ref 78.0–100.0)
MONO ABS: 0.4 10*3/uL (ref 0.1–1.0)
MONOS PCT: 4.4 % (ref 3.0–12.0)
NEUTROS PCT: 68.6 % (ref 43.0–77.0)
Neutro Abs: 6.5 10*3/uL (ref 1.4–7.7)
Platelets: 280 10*3/uL (ref 150.0–400.0)
RBC: 4.25 Mil/uL (ref 3.87–5.11)
RDW: 15.3 % (ref 11.5–15.5)
WBC: 9.5 10*3/uL (ref 4.0–10.5)

## 2015-02-27 LAB — BASIC METABOLIC PANEL
BUN: 8 mg/dL (ref 6–23)
CO2: 27 mEq/L (ref 19–32)
Calcium: 9.1 mg/dL (ref 8.4–10.5)
Chloride: 104 mEq/L (ref 96–112)
Creatinine, Ser: 0.78 mg/dL (ref 0.40–1.20)
GFR: 103.47 mL/min (ref >60.00)
Glucose, Bld: 95 mg/dL (ref 70–99)
POTASSIUM: 3.9 meq/L (ref 3.5–5.1)
SODIUM: 136 meq/L (ref 135–145)

## 2015-02-27 LAB — LIPID PANEL
CHOL/HDL RATIO: 3
Cholesterol: 181 mg/dL (ref 0–200)
HDL: 53.2 mg/dL (ref >39.00)
LDL Cholesterol: 103 mg/dL — ABNORMAL HIGH (ref 0–99)
NONHDL: 127.8
Triglycerides: 122 mg/dL (ref 0.0–149.0)
VLDL: 24.4 mg/dL (ref 0.0–40.0)

## 2015-02-27 LAB — VITAMIN D 25 HYDROXY (VIT D DEFICIENCY, FRACTURES): VITD: 19.98 ng/mL — ABNORMAL LOW (ref 30.00–100.00)

## 2015-02-27 LAB — TSH: TSH: 0.92 u[IU]/mL (ref 0.35–4.50)

## 2015-02-27 MED ORDER — VITAMIN D (ERGOCALCIFEROL) 1.25 MG (50000 UNIT) PO CAPS: 50000.0000 [IU] | ORAL_CAPSULE | ORAL | Status: DC

## 2015-02-27 NOTE — Assessment & Plan Note (Signed)
Pt's PE WNL.  UTD on GYN.  Check labs.  Anticipatory guidance provided.  

## 2015-02-27 NOTE — Patient Instructions (Signed)
Follow up in 1 year or as needed We'll notify you of your lab results and make any changes if needed Keep up the good work on healthy diet and regular exercise Restart Claritin or Zyrtec daily for the allergy component Add the Flonase daily Call with any questions or concerns Happy Spring!!!

## 2015-02-27 NOTE — Telephone Encounter (Signed)
Unable to contact pre visit

## 2015-02-27 NOTE — Progress Notes (Signed)
Pre visit review using our clinic review tool, if applicable. No additional management support is needed unless otherwise documented below in the visit note. 

## 2015-02-27 NOTE — Progress Notes (Signed)
   Subjective:    Patient ID: Ann Frederick, female    DOB: January 21, 1971, 44 y.o.   MRN: [id number]  HPI CPE- UTD on GYN (pap and mammo).   Review of Systems Patient reports no vision/ hearing changes, adenopathy,fever, weight change,  persistant/recurrent hoarseness , swallowing issues, chest pain, palpitations, edema, persistant/recurrent cough, hemoptysis, dyspnea (rest/exertional/paroxysmal nocturnal), gastrointestinal bleeding (melena, rectal bleeding), abdominal pain, significant heartburn, bowel changes, GU symptoms (dysuria, hematuria, incontinence), Gyn symptoms (abnormal  bleeding, pain),  syncope, focal weakness, memory loss, numbness & tingling, skin/hair/nail changes, abnormal bruising or bleeding, anxiety, or depression.     Objective:   Physical Exam General Appearance:    Alert, cooperative, no distress, appears stated age  Head:    Normocephalic, without obvious abnormality, atraumatic  Eyes:    PERRL, conjunctiva/corneas clear, EOM's intact, fundi    benign, both eyes  Ears:    Normal TM's and external ear canals, both ears  Nose:   Nares normal, septum midline, mucosa normal, no drainage    or sinus tenderness  Throat:   Lips, mucosa, and tongue normal; teeth and gums normal  Neck:   Supple, symmetrical, trachea midline, no adenopathy;    Thyroid: no enlargement/tenderness/nodules  Back:     Symmetric, no curvature, ROM normal, no CVA tenderness  Lungs:     Clear to auscultation bilaterally, respirations unlabored  Chest Wall:    No tenderness or deformity   Heart:    Regular rate and rhythm, S1 and S2 normal, no murmur, rub   or gallop  Breast Exam:    Deferred to GYN  Abdomen:     Soft, non-tender, bowel sounds active all four quadrants,    no masses, no organomegaly  Genitalia:    Deferred to GYN  Rectal:    Extremities:   Extremities normal, atraumatic, no cyanosis or edema  Pulses:   2+ and symmetric all extremities  Skin:   Skin color, texture, turgor normal,  no rashes or lesions  Lymph nodes:   Cervical, supraclavicular, and axillary nodes normal  Neurologic:   CNII-XII intact, normal strength, sensation and reflexes    throughout          Assessment & Plan:

## 2015-03-25 ENCOUNTER — Telehealth: Payer: Self-pay | Admitting: *Deleted

## 2015-03-25 NOTE — Telephone Encounter (Signed)
Prior authorization for omeprazole initiated. Awaiting determination. JG//CMA

## 2015-03-26 NOTE — Telephone Encounter (Signed)
PA denied, appeal started. JG//CMA

## 2015-03-31 ENCOUNTER — Ambulatory Visit (INDEPENDENT_AMBULATORY_CARE_PROVIDER_SITE_OTHER): Payer: 59 | Admitting: Gastroenterology

## 2015-03-31 ENCOUNTER — Encounter: Payer: Self-pay | Admitting: Gastroenterology

## 2015-03-31 VITALS — BP 140/70 | HR 68 | Ht 62.25 in | Wt 175.0 lb

## 2015-03-31 DIAGNOSIS — R195 Other fecal abnormalities: Secondary | ICD-10-CM

## 2015-03-31 MED ORDER — PEG-KCL-NACL-NASULF-NA ASC-C 100 G PO SOLR: 1.0000 | Freq: Once | ORAL | Status: DC

## 2015-03-31 NOTE — Progress Notes (Signed)
Review of pertinent gastrointestinal problems: 1. GERD, globus symptoms.  EGD Dr. Ardis Hughs 2012 was normal.  PPI seemed to help her symptoms.  HPI: This is a  very pleasant 44 year old woman    who was referred to me by Dr. Corinna Capra  to evaluate  her Hemoccult positive stool test .    Takes prilosec once daily and if she stops then pyrosis.  Routine physical, hemocult + on gyne exam.    No overt bleeding.  No changes in bowels.  No colon cancer in her family.   Review of systems: Pertinent positive and negative review of systems were noted in the above HPI section. Complete review of systems was performed and was otherwise normal.    Past Medical History  Diagnosis Date  . Acne   . Heart murmur     ? heart murmur in past  . GERD (gastroesophageal reflux disease)     worse while pregnant  . Anemia   . Diabetes mellitus     gest this pregnancy    Past Surgical History  Procedure Laterality Date  . Cesarean section      x 2  . Esophagogastroduodenoscopy  normal     7/12  . Cesarean section  09/29/2012    Procedure: CESAREAN SECTION;  Surgeon: Luz Lex, MD;  Location: [facility];  Service: Obstetrics;  Laterality: N/A;  Repeat edc 10/12/12/REQUEST;Chassity,Dee,Colleen    Current Outpatient Prescriptions  Medication Sig Dispense Refill  . ALPRAZolam (XANAX) 0.5 MG tablet Take 0.5 mg by mouth at bedtime as needed for anxiety.    . clindamycin (CLINDAGEL) 1 % gel Apply 1 application topically daily. Use as directed.    Marland Kitchen FLUoxetine (PROZAC) 10 MG capsule Take 10 mg by mouth at bedtime.    Marland Kitchen omeprazole (PRILOSEC) 40 MG capsule TAKE 1 CAPSULE (40 MG TOTAL) BY MOUTH DAILY. 30 capsule 3  . Vitamin D, Ergocalciferol, (DRISDOL) 50000 UNITS CAPS capsule Take 1 capsule (50,000 Units total) by mouth every 7 (seven) days. (Patient taking differently: Take 50,000 Units by mouth every 7 (seven) days. Takes 50,000 Units once a week; pt takes 2,000 units every day) 4 capsule 3  . zolpidem  (AMBIEN) 10 MG tablet Take 10 mg by mouth as needed.   5  . [DISCONTINUED] pantoprazole (PROTONIX) 40 MG tablet Take 40 mg by mouth daily before breakfast.       No current facility-administered medications for this visit.    Allergies as of 03/31/2015 - Review Complete 03/31/2015  Allergen Reaction Noted  . Azithromycin Nausea And Vomiting 02/06/2015    Family History  Problem Relation Age of Onset  . Depression Mother   . Hypertension Mother   . Diabetes Mother   . Obesity Mother   . Thyroid disease Mother   . Cancer Father     ? lung CA  . Kidney disease Father   . Hydrocephalus Sister   . Colon cancer Neg Hx     History   Social History  . Marital Status: Married    Spouse Name: N/A  . Number of Children: 2  . Years of Education: N/A   Occupational History  . Not on file.   Social History Main Topics  . Smoking status: Never Smoker   . Smokeless tobacco: Never Used  . Alcohol Use: Yes     Comment: 1 glass of wine per week prior to preg  . Drug Use: No  . Sexual Activity: Yes   Other Topics Concern  .  Not on file   Social History Narrative       Physical Exam: BP 140/70 mmHg  Pulse 68  Ht 5' 2.25" (1.581 m)  Wt 175 lb (79.379 kg)  BMI 31.76 kg/m2  LMP 03/17/2015 Constitutional: generally well-appearing Psychiatric: alert and oriented x3 Eyes: extraocular movements intact Mouth: oral pharynx moist, no lesions Neck: supple no lymphadenopathy Cardiovascular: heart regular rate and rhythm Lungs: clear to auscultation bilaterally Abdomen: soft, nontender, nondistended, no obvious ascites, no peritoneal signs, normal bowel sounds Extremities: no lower extremity edema bilaterally Skin: no lesions on visible extremities    Assessment and plan: 44 y.o. female with  Hemoccult-positive stool  It is unlikely that she has colonic neoplasm given stable weight, no overt bleeding, normal bowel habits. She did have Hemoccult positive stool and for that  she should undergo full colonoscopy at her soonest convenience. If that test is negative without precancerous polyps or cancer then she would not need colon cancer screening again including FOBT testing, for 10 years.   Cc: Dr. Osborne Casco

## 2015-03-31 NOTE — Patient Instructions (Addendum)
You will be set up for a colonoscopy (for hemocult + stool). Try changing to H2 blockers for your GERD; ranitidine 150mg  pill, take one pill twice daily. You have been scheduled for a colonoscopy. Please follow written instructions given to you at your visit today.    Please pick up your prep supplies at the pharmacy within the next 1-3 days. If you use inhalers (even only as needed), please bring them with you on the day of your procedure. Your physician has requested that you go to www.startemmi.com and enter the access code given to you at your visit today. This web site gives a general overview about your procedure. However, you should still follow specific instructions given to you by our office regarding your preparation for the procedure.

## 2015-04-04 ENCOUNTER — Encounter: Payer: Self-pay | Admitting: Gastroenterology

## 2015-04-11 ENCOUNTER — Encounter: Payer: 59 | Admitting: Gastroenterology

## 2015-05-07 ENCOUNTER — Encounter: Payer: Self-pay | Admitting: Gastroenterology

## 2015-05-07 ENCOUNTER — Ambulatory Visit (AMBULATORY_SURGERY_CENTER): Payer: 59 | Admitting: Gastroenterology

## 2015-05-07 VITALS — BP 134/83 | HR 69 | Temp 97.8°F | Resp 26 | Ht 62.25 in | Wt 175.0 lb

## 2015-05-07 DIAGNOSIS — R195 Other fecal abnormalities: Secondary | ICD-10-CM | POA: Diagnosis present

## 2015-05-07 MED ORDER — SODIUM CHLORIDE 0.9 % IV SOLN: 500.0000 mL | INTRAVENOUS | Status: DC

## 2015-05-07 NOTE — Op Note (Addendum)
[place]  [address]. [place], [address]   COLONOSCOPY PROCEDURE REPORT  PATIENT: Ann Frederick, Ann Frederick  MR#: [id number] BIRTHDATE: [birth year] , 43  yrs. old GENDER: female ENDOSCOPIST: Milus Banister, MD REFERRED XY:IAXKP Lowe, M.D. PROCEDURE DATE:  05/07/2015 PROCEDURE:   Colonoscopy, diagnostic First Screening Colonoscopy - Avg.  risk and is 50 yrs.  old or older - No.  Prior Negative Screening - Now for repeat screening. N/A  History of Adenoma - Now for follow-up colonoscopy & has been > or = to 3 yrs.  N/A ASA CLASS:   Class II INDICATIONS: Evaluation of unexplained GI bleeding (hemocult + stool). MEDICATIONS: Monitored anesthesia care and Propofol 200 mg IV  DESCRIPTION OF PROCEDURE:   After the risks benefits and alternatives of the procedure were thoroughly explained, informed consent was obtained.  The digital rectal exam revealed no abnormalities of the rectum.   The LB [id number]  endoscope was introduced through the anus and advanced to the cecum, which was identified by both the appendix and ileocecal valve. No adverse events experienced.   The quality of the prep was excellent.  The instrument was then slowly withdrawn as the colon was fully examined.   COLON FINDINGS: A normal appearing cecum, ileocecal valve, and appendiceal orifice were identified.  The ascending, transverse, descending, sigmoid colon, and rectum appeared unremarkable. Retroflexed views revealed no abnormalities. The time to cecum = 3.3 Withdrawal time = 7.4   The scope was withdrawn and the procedure completed. COMPLICATIONS: There were no immediate complications.  ENDOSCOPIC IMPRESSION: Normal colonoscopy No polyps or cancers  RECOMMENDATIONS: You should continue to follow colorectal cancer screening guidelines for "routine risk" patients with a repeat colonoscopy in 10 years. There is no need for FOBT (stool) testing for at least 5 years.  eSigned:  Milus Banister, MD 05/07/2015 11:21 AM Revised: 05/07/2015 11:21 AM  cc: Dr. Birdie Riddle

## 2015-05-07 NOTE — Progress Notes (Signed)
Report to PACU, RN, vss, BBS= Clear.  

## 2015-05-07 NOTE — Patient Instructions (Signed)
YOU HAD AN ENDOSCOPIC PROCEDURE TODAY AT [facility]:   Refer to the procedure report that was given to you for any specific questions about what was found during the examination.  If the procedure report does not answer your questions, please call your gastroenterologist to clarify.  If you requested that your care partner not be given the details of your procedure findings, then the procedure report has been included in a sealed envelope for you to review at your convenience later.  YOU SHOULD EXPECT: Some feelings of bloating in the abdomen. Passage of more gas than usual.  Walking can help get rid of the air that was put into your GI tract during the procedure and reduce the bloating. If you had a lower endoscopy (such as a colonoscopy or flexible sigmoidoscopy) you may notice spotting of blood in your stool or on the toilet paper. If you underwent a bowel prep for your procedure, you may not have a normal bowel movement for a few days.  Please Note:  You might notice some irritation and congestion in your nose or some drainage.  This is from the oxygen used during your procedure.  There is no need for concern and it should clear up in a day or so.  SYMPTOMS TO REPORT IMMEDIATELY:   Following lower endoscopy (colonoscopy or flexible sigmoidoscopy):  Excessive amounts of blood in the stool  Significant tenderness or worsening of abdominal pains  Swelling of the abdomen that is new, acute  Fever of 100F or higher  For urgent or emergent issues, a gastroenterologist can be reached at any hour by calling [phone].  DIET: Your first meal following the procedure should be a small meal and then it is ok to progress to your normal diet. Heavy or fried foods are harder to digest and may make you feel nauseous or bloated.  Likewise, meals heavy in dairy and vegetables can increase bloating.  Drink plenty of fluids but you should avoid alcoholic beverages for 24 hours.  ACTIVITY:   You should plan to take it easy for the rest of today and you should NOT DRIVE or use heavy machinery until tomorrow (because of the sedation medicines used during the test).    FOLLOW UP: Our staff will call the number listed on your records the next business day following your procedure to check on you and address any questions or concerns that you may have regarding the information given to you following your procedure. If we do not reach you, we will leave a message.  However, if you are feeling well and you are not experiencing any problems, there is no need to return our call.  We will assume that you have returned to your regular daily activities without incident.  SIGNATURES/CONFIDENTIALITY: You and/or your care partner have signed paperwork which will be entered into your electronic medical record.  These signatures attest to the fact that that the information above on your After Visit Summary has been reviewed and is understood.  Full responsibility of the confidentiality of this discharge information lies with you and/or your care-partner.  Continue your normal medications

## 2015-05-08 ENCOUNTER — Telehealth: Payer: Self-pay | Admitting: *Deleted

## 2015-05-08 NOTE — Telephone Encounter (Signed)
  Follow up Call-  Call back number 05/07/2015  Post procedure Call Back phone  # [phone]  Permission to leave phone message Yes     Patient questions:  Do you have a fever, pain , or abdominal swelling? No. Pain Score  0 *  Have you tolerated food without any problems? Yes.    Have you been able to return to your normal activities? Yes.    Do you have any questions about your discharge instructions: Diet   No. Medications  No. Follow up visit  No.  Do you have questions or concerns about your Care? No.  Actions: * If pain score is 4 or above: No action needed, pain <4.

## 2015-05-15 ENCOUNTER — Other Ambulatory Visit: Payer: Self-pay | Admitting: General Practice

## 2015-05-15 NOTE — Telephone Encounter (Signed)
Med denied, pt should be on vitamin d otc supplement.

## 2015-05-20 ENCOUNTER — Other Ambulatory Visit: Payer: Self-pay | Admitting: General Practice

## 2015-05-20 MED ORDER — VITAMIN D (ERGOCALCIFEROL) 1.25 MG (50000 UNIT) PO CAPS: 50000.0000 [IU] | ORAL_CAPSULE | ORAL | Status: DC

## 2015-06-19 ENCOUNTER — Ambulatory Visit (INDEPENDENT_AMBULATORY_CARE_PROVIDER_SITE_OTHER): Payer: 59 | Admitting: Family Medicine

## 2015-06-19 ENCOUNTER — Encounter: Payer: Self-pay | Admitting: Family Medicine

## 2015-06-19 VITALS — BP 110/80 | HR 75 | Temp 98.1°F | Resp 16 | Wt 178.2 lb

## 2015-06-19 DIAGNOSIS — M79644 Pain in right finger(s): Secondary | ICD-10-CM | POA: Diagnosis not present

## 2015-06-19 DIAGNOSIS — H10021 Other mucopurulent conjunctivitis, right eye: Secondary | ICD-10-CM

## 2015-06-19 DIAGNOSIS — H10029 Other mucopurulent conjunctivitis, unspecified eye: Secondary | ICD-10-CM | POA: Insufficient documentation

## 2015-06-19 MED ORDER — DICLOFENAC SODIUM 1 % TD GEL: 2.0000 g | Freq: Four times a day (QID) | TRANSDERMAL | Status: DC

## 2015-06-19 MED ORDER — POLYMYXIN B-TRIMETHOPRIM 10000-0.1 UNIT/ML-% OP SOLN: 2.0000 [drp] | Freq: Three times a day (TID) | OPHTHALMIC | Status: DC

## 2015-06-19 NOTE — Progress Notes (Signed)
Pre visit review using our clinic review tool, if applicable. No additional management support is needed unless otherwise documented below in the visit note. 

## 2015-06-19 NOTE — Patient Instructions (Signed)
Follow up as needed Start the Voltaren gel 4x/day on the thumb for pain- if no improvement in the next 10-14 days, let me know so we can send you to a hand specialist Use the eye drops as directed for 7 days Call with any questions or concerns Hang in there!!

## 2015-06-19 NOTE — Progress Notes (Signed)
   Subjective:    Patient ID: Ann Frederick, female    DOB: [birth year], 44 y.o.   MRN: [id number]  HPI R thumb pain- sxs started ~3 weeks ago.  No known injury.  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  No swelling.  Pain encompasses the thenar eminence and radiates on the extensor side into the wrist.  Hasn't taken OTC meds for pain relief.  Worse at night.  Baseline is 'constant dull ache'.  Pt is unable to take Mobic due to severe GI side effects.  'pink eye'- woke this AM w/ R eye crusted shut.  + goopy.  Some itching and irritation.  Was more red this AM but still feels gritty.  No known sick contacts.   Review of Systems For ROS see HPI     Objective:   Physical Exam  Constitutional: She is oriented to person, place, and time. She appears well-developed and well-nourished. No distress.  HENT:  Head: Normocephalic and atraumatic.  Nose: Nose normal.  Mouth/Throat: Oropharynx is clear and moist.  TMs WNL bilaterally No TTP over sinuses  Eyes: EOM are normal. Pupils are equal, round, and reactive to light.  R conjunctival injxn  Neck: Normal range of motion. Neck supple.  Musculoskeletal:  No swelling over R CMC joint, 1st MCP joint, no TTP over snuffbox or thenar eminence.  + discomfort w/ abduction of thumb  Lymphadenopathy:    She has no cervical adenopathy.  Neurological: She is alert and oriented to person, place, and time.  Skin: Skin is warm and dry. No erythema.  Vitals reviewed.         Assessment & Plan:

## 2015-06-22 NOTE — Assessment & Plan Note (Signed)
New.  Suspect tendonitis and/or degenerative changes at [facility] and MCP joints.  Pt is not interested in systemic NSAIDs at this time- will start topical Voltaren gel.  Ice.  If no improvement, will refer to hand specialist.  Pt expressed understanding and is in agreement w/ plan.

## 2015-06-22 NOTE — Assessment & Plan Note (Signed)
New.  R eye.  Start abx drops as pt is Therapist, sports and works w/ pts.  Reviewed that this is highly contagious and the importance of hand hygiene.  Reviewed supportive care and red flags that should prompt return.  Pt expressed understanding and is in agreement w/ plan.

## 2015-07-17 ENCOUNTER — Encounter: Payer: Self-pay | Admitting: Family Medicine

## 2015-07-17 DIAGNOSIS — M79646 Pain in unspecified finger(s): Secondary | ICD-10-CM

## 2015-08-03 ENCOUNTER — Encounter (HOSPITAL_BASED_OUTPATIENT_CLINIC_OR_DEPARTMENT_OTHER): Payer: Self-pay | Admitting: *Deleted

## 2015-08-03 ENCOUNTER — Emergency Department (HOSPITAL_BASED_OUTPATIENT_CLINIC_OR_DEPARTMENT_OTHER)
Admission: EM | Admit: 2015-08-03 | Discharge: 2015-08-03 | Disposition: A | Discharge status: Home / Self Care | Payer: 59 | Attending: Emergency Medicine | Admitting: Emergency Medicine

## 2015-08-03 DIAGNOSIS — R519 Headache, unspecified: Secondary | ICD-10-CM

## 2015-08-03 DIAGNOSIS — Z872 Personal history of diseases of the skin and subcutaneous tissue: Secondary | ICD-10-CM | POA: Insufficient documentation

## 2015-08-03 DIAGNOSIS — Z791 Long term (current) use of non-steroidal anti-inflammatories (NSAID): Secondary | ICD-10-CM | POA: Diagnosis not present

## 2015-08-03 DIAGNOSIS — Z862 Personal history of diseases of the blood and blood-forming organs and certain disorders involving the immune mechanism: Secondary | ICD-10-CM | POA: Diagnosis not present

## 2015-08-03 DIAGNOSIS — R51 Headache: Secondary | ICD-10-CM | POA: Insufficient documentation

## 2015-08-03 DIAGNOSIS — K219 Gastro-esophageal reflux disease without esophagitis: Secondary | ICD-10-CM | POA: Diagnosis not present

## 2015-08-03 DIAGNOSIS — R110 Nausea: Secondary | ICD-10-CM | POA: Diagnosis not present

## 2015-08-03 DIAGNOSIS — Z7982 Long term (current) use of aspirin: Secondary | ICD-10-CM | POA: Diagnosis not present

## 2015-08-03 DIAGNOSIS — Z79899 Other long term (current) drug therapy: Secondary | ICD-10-CM | POA: Diagnosis not present

## 2015-08-03 DIAGNOSIS — R011 Cardiac murmur, unspecified: Secondary | ICD-10-CM | POA: Insufficient documentation

## 2015-08-03 MED ORDER — MORPHINE SULFATE 4 MG/ML IJ SOLN
4.0000 mg | Freq: Once | INTRAMUSCULAR | Status: AC
  Administered 2015-08-03: 4 mg via INTRAVENOUS
  Filled 2015-08-03: qty 1

## 2015-08-03 MED ORDER — METOCLOPRAMIDE HCL 5 MG/ML IJ SOLN
10.0000 mg | Freq: Once | INTRAMUSCULAR | Status: AC
  Administered 2015-08-03: 10 mg via INTRAVENOUS
  Filled 2015-08-03: qty 2

## 2015-08-03 NOTE — Discharge Instructions (Signed)
Headaches, Frequently Asked Questions Take Tylenol or Advil as directed as needed for headache. Call Dr.Tabori to schedule a follow-up visit if headaches persist MIGRAINE HEADACHES Q: What is migraine? What causes it? How can I treat it? A: Generally, migraine headaches begin as a dull ache. Then they develop into a constant, throbbing, and pulsating pain. You may experience pain at the temples. You may experience pain at the front or back of one or both sides of the head. The pain is usually accompanied by a combination of:  Nausea.  Vomiting.  Sensitivity to light and noise. Some people (about 15%) experience an aura (see below) before an attack. The cause of migraine is believed to be chemical reactions in the brain. Treatment for migraine may include over-the-counter or prescription medications. It may also include self-help techniques. These include relaxation training and biofeedback.  Q: What is an aura? A: About 15% of people with migraine get an "aura". This is a sign of neurological symptoms that occur before a migraine headache. You may see wavy or jagged lines, dots, or flashing lights. You might experience tunnel vision or blind spots in one or both eyes. The aura can include visual or auditory hallucinations (something imagined). It may include disruptions in smell (such as strange odors), taste or touch. Other symptoms include:  Numbness.  A "pins and needles" sensation.  Difficulty in recalling or speaking the correct word. These neurological events may last as long as 60 minutes. These symptoms will fade as the headache begins. Q: What is a trigger? A: Certain physical or environmental factors can lead to or "trigger" a migraine. These include:  Foods.  Hormonal changes.  Weather.  Stress. It is important to remember that triggers are different for everyone. To help prevent migraine attacks, you need to figure out which triggers affect you. Keep a headache diary. This  is a good way to track triggers. The diary will help you talk to your healthcare professional about your condition. Q: Does weather affect migraines? A: Bright sunshine, hot, humid conditions, and drastic changes in barometric pressure may lead to, or "trigger," a migraine attack in some people. But studies have shown that weather does not act as a trigger for everyone with migraines. Q: What is the link between migraine and hormones? A: Hormones start and regulate many of your body's functions. Hormones keep your body in balance within a constantly changing environment. The levels of hormones in your body are unbalanced at times. Examples are during menstruation, pregnancy, or menopause. That can lead to a migraine attack. In fact, about three quarters of all women with migraine report that their attacks are related to the menstrual cycle.  Q: Is there an increased risk of stroke for migraine sufferers? A: The likelihood of a migraine attack causing a stroke is very remote. That is not to say that migraine sufferers cannot have a stroke associated with their migraines. In persons under age 62, the most common associated factor for stroke is migraine headache. But over the course of a person's normal life span, the occurrence of migraine headache may actually be associated with a reduced risk of dying from cerebrovascular disease due to stroke.  Q: What are acute medications for migraine? A: Acute medications are used to treat the pain of the headache after it has started. Examples over-the-counter medications, NSAIDs, ergots, and triptans.  Q: What are the triptans? A: Triptans are the newest class of abortive medications. They are specifically targeted to treat migraine. Triptans  are vasoconstrictors. They moderate some chemical reactions in the brain. The triptans work on receptors in your brain. Triptans help to restore the balance of a neurotransmitter called serotonin. Fluctuations in levels of  serotonin are thought to be a main cause of migraine.  Q: Are over-the-counter medications for migraine effective? A: Over-the-counter, or "OTC," medications may be effective in relieving mild to moderate pain and associated symptoms of migraine. But you should see your caregiver before beginning any treatment regimen for migraine.  Q: What are preventive medications for migraine? A: Preventive medications for migraine are sometimes referred to as "prophylactic" treatments. They are used to reduce the frequency, severity, and length of migraine attacks. Examples of preventive medications include antiepileptic medications, antidepressants, beta-blockers, calcium channel blockers, and NSAIDs (nonsteroidal anti-inflammatory drugs). Q: Why are anticonvulsants used to treat migraine? A: During the past few years, there has been an increased interest in antiepileptic drugs for the prevention of migraine. They are sometimes referred to as "anticonvulsants". Both epilepsy and migraine may be caused by similar reactions in the brain.  Q: Why are antidepressants used to treat migraine? A: Antidepressants are typically used to treat people with depression. They may reduce migraine frequency by regulating chemical levels, such as serotonin, in the brain.  Q: What alternative therapies are used to treat migraine? A: The term "alternative therapies" is often used to describe treatments considered outside the scope of conventional Western medicine. Examples of alternative therapy include acupuncture, acupressure, and yoga. Another common alternative treatment is herbal therapy. Some herbs are believed to relieve headache pain. Always discuss alternative therapies with your caregiver before proceeding. Some herbal products contain arsenic and other toxins. TENSION HEADACHES Q: What is a tension-type headache? What causes it? How can I treat it? A: Tension-type headaches occur randomly. They are often the result of  temporary stress, anxiety, fatigue, or anger. Symptoms include soreness in your temples, a tightening band-like sensation around your head (a "vice-like" ache). Symptoms can also include a pulling feeling, pressure sensations, and contracting head and neck muscles. The headache begins in your forehead, temples, or the back of your head and neck. Treatment for tension-type headache may include over-the-counter or prescription medications. Treatment may also include self-help techniques such as relaxation training and biofeedback. CLUSTER HEADACHES Q: What is a cluster headache? What causes it? How can I treat it? A: Cluster headache gets its name because the attacks come in groups. The pain arrives with little, if any, warning. It is usually on one side of the head. A tearing or bloodshot eye and a runny nose on the same side of the headache may also accompany the pain. Cluster headaches are believed to be caused by chemical reactions in the brain. They have been described as the most severe and intense of any headache type. Treatment for cluster headache includes prescription medication and oxygen. SINUS HEADACHES Q: What is a sinus headache? What causes it? How can I treat it? A: When a cavity in the bones of the face and skull (a sinus) becomes inflamed, the inflammation will cause localized pain. This condition is usually the result of an allergic reaction, a tumor, or an infection. If your headache is caused by a sinus blockage, such as an infection, you will probably have a fever. An x-ray will confirm a sinus blockage. Your caregiver's treatment might include antibiotics for the infection, as well as antihistamines or decongestants.  REBOUND HEADACHES Q: What is a rebound headache? What causes it? How can I treat  it? A: A pattern of taking acute headache medications too often can lead to a condition known as "rebound headache." A pattern of taking too much headache medication includes taking it more  than 2 days per week or in excessive amounts. That means more than the label or a caregiver advises. With rebound headaches, your medications not only stop relieving pain, they actually begin to cause headaches. Doctors treat rebound headache by tapering the medication that is being overused. Sometimes your caregiver will gradually substitute a different type of treatment or medication. Stopping may be a challenge. Regularly overusing a medication increases the potential for serious side effects. Consult a caregiver if you regularly use headache medications more than 2 days per week or more than the label advises. ADDITIONAL QUESTIONS AND ANSWERS Q: What is biofeedback? A: Biofeedback is a self-help treatment. Biofeedback uses special equipment to monitor your body's involuntary physical responses. Biofeedback monitors:  Breathing.  Pulse.  Heart rate.  Temperature.  Muscle tension.  Brain activity. Biofeedback helps you refine and perfect your relaxation exercises. You learn to control the physical responses that are related to stress. Once the technique has been mastered, you do not need the equipment any more. Q: Are headaches hereditary? A: Four out of five (80%) of people that suffer report a family history of migraine. Scientists are not sure if this is genetic or a family predisposition. Despite the uncertainty, a child has a 50% chance of having migraine if one parent suffers. The child has a 75% chance if both parents suffer.  Q: Can children get headaches? A: By the time they reach high school, most young people have experienced some type of headache. Many safe and effective approaches or medications can prevent a headache from occurring or stop it after it has begun.  Q: What type of doctor should I see to diagnose and treat my headache? A: Start with your primary caregiver. Discuss his or her experience and approach to headaches. Discuss methods of classification, diagnosis, and  treatment. Your caregiver may decide to recommend you to a headache specialist, depending upon your symptoms or other physical conditions. Having diabetes, allergies, etc., may require a more comprehensive and inclusive approach to your headache. The National Headache Foundation will provide, upon request, a list of [facility] physician members in your state. Document Released: 03/04/2004 Document Revised: 03/06/2012 Document Reviewed: 08/12/2008 [facility] Patient Information 2015 [place], [place]. This information is not intended to replace advice given to you by your health care provider. Make sure you discuss any questions you have with your health care provider.

## 2015-08-03 NOTE — ED Provider Notes (Signed)
CSN: [id number]     Arrival date & time 08/03/15  3329 History   None    No chief complaint on file.    (Consider location/radiation/quality/duration/timing/severity/associated sxs/prior Treatment) HPI Complains of gradual onset headache 10 PM yesterday. Associated symptoms include nausea. Headache is frontal in nature, nonradiating. She denies fever denies photophobia no focal numbness or weakness. No trauma. Nothing makes symptoms better or worse. No other associated symptoms. Treated with ibuprofen and over-the-counter nasal spray, without relief. Past Medical History  Diagnosis Date  . Acne   . Heart murmur     ? heart murmur in past  . GERD (gastroesophageal reflux disease)     worse while pregnant  . Anemia   . Diabetes mellitus     gest this pregnancy   Past Surgical History  Procedure Laterality Date  . Cesarean section      x 2  . Esophagogastroduodenoscopy  normal     7/12  . Cesarean section  09/29/2012    Procedure: CESAREAN SECTION;  Surgeon: Luz Lex, MD;  Location: [facility];  Service: Obstetrics;  Laterality: N/A;  Repeat edc 10/12/12/REQUEST;Chassity,Dee,Colleen   Family History  Problem Relation Age of Onset  . Depression Mother   . Hypertension Mother   . Diabetes Mother   . Obesity Mother   . Thyroid disease Mother   . Cancer Father     ? lung CA  . Kidney disease Father   . Hydrocephalus Sister   . Colon cancer Neg Hx    History  Substance Use Topics  . Smoking status: Never Smoker   . Smokeless tobacco: Never Used  . Alcohol Use: Yes     Comment: 1 glass of wine per week prior to preg   OB History    Gravida Para Term Preterm AB TAB SAB Ectopic Multiple Living   7 3 3  0 4 1 3  0 0 3     Review of Systems  Constitutional: Negative.   Respiratory: Negative.   Cardiovascular: Negative.   Gastrointestinal: Positive for nausea.  Genitourinary:       Menses irregular. Has IUD  Musculoskeletal: Negative.   Skin: Negative.   Neurological:  Positive for headaches.  Psychiatric/Behavioral: Negative.   All other systems reviewed and are negative.     Allergies  Azithromycin  Home Medications   Prior to Admission medications   Medication Sig Start Date End Date Taking? Authorizing Provider  ALPRAZolam Duanne Moron) 0.5 MG tablet Take 0.5 mg by mouth at bedtime as needed for anxiety.    Historical Provider, MD  aspirin 81 MG tablet Take 81 mg by mouth daily.    Historical Provider, MD  b complex vitamins capsule Take 1 capsule by mouth daily.    Historical Provider, MD  clindamycin (CLINDAGEL) 1 % gel Apply 1 application topically daily. Use as directed.    Historical Provider, MD  diclofenac sodium (VOLTAREN) 1 % GEL Apply 2 g topically 4 (four) times daily. 06/19/15   Midge Minium, MD  ESTERIFIED ESTROGENS PO Take by mouth.    Historical Provider, MD  FLUoxetine (PROZAC) 10 MG capsule Take 10 mg by mouth at bedtime.    Historical Provider, MD  magnesium 30 MG tablet Take 30 mg by mouth 2 (two) times daily.    Historical Provider, MD  omeprazole (PRILOSEC) 40 MG capsule TAKE 1 CAPSULE (40 MG TOTAL) BY MOUTH DAILY. 12/19/14   Midge Minium, MD  trimethoprim-polymyxin b (POLYTRIM) ophthalmic solution Place 2 drops into  the right eye 3 (three) times daily. 06/19/15   Midge Minium, MD  Vitamin D, Ergocalciferol, (DRISDOL) 50000 UNITS CAPS capsule Take 1 capsule (50,000 Units total) by mouth every 7 (seven) days. 05/20/15   Midge Minium, MD  zolpidem (AMBIEN) 10 MG tablet Take 10 mg by mouth as needed.  03/24/15   Historical Provider, MD   BP 138/88 mmHg  Pulse 82  Temp(Src) 98.3 F (36.8 C) (Oral)  Resp 18  Ht 5\' 2"  (1.575 m)  Wt 170 lb (77.111 kg)  BMI 31.09 kg/m2  SpO2 100% Physical Exam  Constitutional: She is oriented to person, place, and time. She appears well-developed and well-nourished.  HENT:  Head: Normocephalic and atraumatic.  Eyes: Conjunctivae are normal. Pupils are equal, round, and reactive  to light.  Fundi benign  Neck: Neck supple. No tracheal deviation present. No thyromegaly present.  Cardiovascular: Normal rate and regular rhythm.   No murmur heard. Pulmonary/Chest: Effort normal and breath sounds normal.  Abdominal: Soft. Bowel sounds are normal. She exhibits no distension. There is no tenderness.  Musculoskeletal: Normal range of motion. She exhibits no edema or tenderness.  Neurological: She is alert and oriented to person, place, and time. No cranial nerve deficit. Coordination normal.  Gait normal Romberg normal pronator drift normal DTR symmetric bilaterally knee jerk ankle jerk biceps toes downward going bilaterally  Skin: Skin is warm and dry. No rash noted.  Psychiatric: She has a normal mood and affect.  Nursing note and vitals reviewed.   ED Course  Procedures (including critical care time) Labs Review Labs Reviewed - No data to display  Imaging Review No results found.   EKG Interpretation None     9:20 AM headache is unchanged after treatment with intravenous Reglan. Nausea is improved. Patient appears comfortable. Alert Glasgow Coma Score 15. 11:10 AM headache much improved after treatment with intravenous morphine. Patient alert Glasgow Coma Score 15 no distress MDM  Emergent imaging not indicated. Discussed with patient who agrees Final diagnoses:  None   plan follow-up with Dr.Tabouri if headaches persist. Tylenol or Advil for pain Diagnosis nonspecific headache      Orlie Dakin, MD 08/03/15 1115

## 2015-08-03 NOTE — ED Notes (Signed)
Patient c/o of headache since around 22:00 last night, took ibuprofen and ambien to sleep, but kept waking up, headache has grown worse

## 2015-09-10 ENCOUNTER — Encounter: Payer: Self-pay | Admitting: Gastroenterology

## 2015-09-15 ENCOUNTER — Ambulatory Visit (INDEPENDENT_AMBULATORY_CARE_PROVIDER_SITE_OTHER): Payer: 59 | Admitting: Medical

## 2015-09-15 ENCOUNTER — Encounter: Payer: Self-pay | Admitting: Medical

## 2015-09-15 VITALS — BP 150/90 | HR 90 | Temp 98.3°F | Resp 16 | Ht 62.5 in | Wt 186.0 lb

## 2015-09-15 DIAGNOSIS — R030 Elevated blood-pressure reading, without diagnosis of hypertension: Secondary | ICD-10-CM | POA: Diagnosis not present

## 2015-09-15 DIAGNOSIS — R51 Headache: Secondary | ICD-10-CM | POA: Diagnosis not present

## 2015-09-15 DIAGNOSIS — R519 Headache, unspecified: Secondary | ICD-10-CM

## 2015-09-15 DIAGNOSIS — IMO0001 Reserved for inherently not codable concepts without codable children: Secondary | ICD-10-CM

## 2015-09-15 MED ORDER — KETOROLAC TROMETHAMINE 30 MG/ML IJ SOLN
30.0000 mg | Freq: Once | INTRAMUSCULAR | Status: AC
  Administered 2015-09-15: 30 mg via INTRAMUSCULAR

## 2015-09-15 MED ORDER — SUMATRIPTAN SUCCINATE 50 MG PO TABS: 50.0000 mg | ORAL_TABLET | ORAL | Status: DC | PRN

## 2015-09-15 MED ORDER — TIZANIDINE HCL 6 MG PO CAPS: ORAL_CAPSULE | ORAL | Status: DC

## 2015-09-15 NOTE — Progress Notes (Signed)
Pre visit review using our clinic review tool, if applicable. No additional management support is needed unless otherwise documented below in the visit note. 

## 2015-09-15 NOTE — Progress Notes (Signed)
Subjective:    Patient ID: Ann Frederick, female    DOB: 1971/05/20, 44 y.o.   MRN: [id number]  HPI  Pt in states has some HA. Pt states has mostly pain frontal area but also back side of her head. Pt states she feels like sinus infection. Pt blood pressure in past usually is 120/80. Pt blood pressure has varied over last year. A lot of borderline bp levels. Pt states 4 of 7 days during the week has headaches.  Pt states no particular time of day. HA comes on random. He 2-3 hours duration. She usually just takes tylenol.  Pt states some nausea with ha if last a while. No vomiting. Maybe mild faint light sensitivity.  Pt states mom and sister did have history of migraines in the past.   On review no gross motor sensory function deficits.  LMP - August 19, 2015.  Presently has ha 4-5/10.  Started this am around 7 am.   Pt bp at home is 130-140 range. Diastolic usually under 80.  Pt bp when has ha is usually controlled.    Review of Systems  Constitutional: Negative for fever, chills, diaphoresis, activity change and fatigue.  Respiratory: Negative for cough, chest tightness and shortness of breath.   Cardiovascular: Negative for chest pain, palpitations and leg swelling.  Gastrointestinal: Positive for nausea. Negative for vomiting and abdominal pain.  Musculoskeletal: Negative for neck pain and neck stiffness.  Neurological: Positive for headaches. Negative for dizziness, tremors, seizures, syncope, facial asymmetry, speech difficulty, weakness, light-headedness and numbness.       Some light sensitivity with her HA.  Hematological: Negative for adenopathy. Does not bruise/bleed easily.  Psychiatric/Behavioral: Negative for behavioral problems, confusion and agitation. The patient is not nervous/anxious.      Past Medical History  Diagnosis Date  . Acne   . Heart murmur     ? heart murmur in past  . GERD (gastroesophageal reflux disease)     worse while pregnant  . Anemia     . Diabetes mellitus     gest this pregnancy    Social History   Social History  . Marital Status: Married    Spouse Name: N/A  . Number of Children: 2  . Years of Education: N/A   Occupational History  . Not on file.   Social History Main Topics  . Smoking status: Never Smoker   . Smokeless tobacco: Never Used  . Alcohol Use: Yes     Comment: 1 glass of wine per week prior to preg  . Drug Use: No  . Sexual Activity: Yes   Other Topics Concern  . Not on file   Social History Narrative    Past Surgical History  Procedure Laterality Date  . Cesarean section      x 2  . Esophagogastroduodenoscopy  normal     7/12  . Cesarean section  09/29/2012    Procedure: CESAREAN SECTION;  Surgeon: Luz Lex, MD;  Location: [facility];  Service: Obstetrics;  Laterality: N/A;  Repeat edc 10/12/12/REQUEST;Chassity,Dee,Colleen    Family History  Problem Relation Age of Onset  . Depression Mother   . Hypertension Mother   . Diabetes Mother   . Obesity Mother   . Thyroid disease Mother   . Cancer Father     ? lung CA  . Kidney disease Father   . Hydrocephalus Sister   . Colon cancer Neg Hx     Allergies  Allergen Reactions  .  Azithromycin Nausea And Vomiting    Current Outpatient Prescriptions on File Prior to Visit  Medication Sig Dispense Refill  . ALPRAZolam (XANAX) 0.5 MG tablet Take 0.5 mg by mouth at bedtime as needed for anxiety.    Marland Kitchen aspirin 81 MG tablet Take 81 mg by mouth daily.    Marland Kitchen b complex vitamins capsule Take 1 capsule by mouth daily.    . clindamycin (CLINDAGEL) 1 % gel Apply 1 application topically daily. Use as directed.    Marland Kitchen FLUoxetine (PROZAC) 10 MG capsule Take 10 mg by mouth at bedtime.    . magnesium 30 MG tablet Take 30 mg by mouth 2 (two) times daily.    Marland Kitchen omeprazole (PRILOSEC) 40 MG capsule TAKE 1 CAPSULE (40 MG TOTAL) BY MOUTH DAILY. 30 capsule 3  . Vitamin D, Ergocalciferol, (DRISDOL) 50000 UNITS CAPS capsule Take 1 capsule (50,000 Units  total) by mouth every 7 (seven) days. 12 capsule 0  . zolpidem (AMBIEN) 10 MG tablet Take 10 mg by mouth as needed.   5  . [DISCONTINUED] pantoprazole (PROTONIX) 40 MG tablet Take 40 mg by mouth daily before breakfast.       No current facility-administered medications on file prior to visit.    BP 140/90 mmHg  Pulse 90  Temp(Src) 98.3 F (36.8 C) (Oral)  Resp 16  Ht 5' 2.5" (1.588 m)  Wt 186 lb (84.369 kg)  BMI 33.46 kg/m2  SpO2 98%  LMP 08/19/2015       Objective:   Physical Exam  General Mental Status- Alert. General Appearance- Not in acute distress.   Skin General: Color- Normal Color. Moisture- Normal Moisture.  Neck Carotid Arteries- Normal color. Moisture- Normal Moisture. No carotid bruits. No JVD. Mild trapezius tenderness on exam today. More so in region where trap insert into occipital region.  Chest and Lung Exam Auscultation: Breath Sounds:-Normal. CTA.  Cardiovascular Auscultation:Rythm- Regular,Rate, and rhythm.  Murmurs & Other Heart Sounds:Auscultation of the heart reveals- No Murmurs.  Abdomen Inspection:-Inspeection Normal. Palpation/Percussion:Note:No mass. Palpation and Percussion of the abdomen reveal- Non Tender, Non Distended + BS, no rebound or guarding.   Neurologic Cranial Nerve exam:- CN III-XII intact(No nystagmus), symmetric smile. Drift Test:- No drift. Finger to Nose:- Normal/Intact Strength:- 5/5 equal and symmetric strength both upper and lower extremities.      Assessment & Plan:  For your headache today we gave toradol 30 mg im.  Some of your features support possible migraine but on physical exam some trapezius tightness/soreness indicating possible tension HA.  I want you to continue to check your bp daily basis. Check some levels at work and some at home. Document those readings. On  to avoid daily nsaid use while checking your blood pressure.  Periodic use of nsaid would be ok.  If you do have ha late in the day  with trapezius tightness then making zanflex available. If ha with light senitivity or nauseau then take imitrex early onset.  Important to get update on your bp levels. And how your headaches respond to treatment. Sometimes imaging of head or refer to neurologist area indicated.  If bp is trending on high side when she checks and her blood pressure remains elevated then try low dose b-blocker.(presently since pt bp readings are lower at home considering bp elevation may be white coat or related to her ha)  Follow up in 2 wks or as needed.

## 2015-09-15 NOTE — Patient Instructions (Addendum)
For your headache today we gave toradol 30 mg im.  Some of your features support possible migraine but on physical exam some trapezius tightness/soreness indicating possible tension HA.  I want you to continue to check your bp daily basis. Check some levels at work and some at home. Document those readings. On  to avoid daily nsaid use while checking your blood pressure.  Periodic use of nsaid would be ok.  If you do have ha late in the day with trapezius tightness then making zanflex available. If ha with light senitivity or nauseau then take imitrex early onset.  Important to get update on your bp levels. And how your headaches respond to treatment. Sometimes imaging of head or refer to neurologist area indicated.  If bp is trending on high side when she checks and her blood pressure remains elevated then try low dose b-blocker.  Follow up in 2 wks or as needed.

## 2015-09-27 ENCOUNTER — Encounter: Payer: Self-pay | Admitting: Medical

## 2015-10-12 ENCOUNTER — Emergency Department (HOSPITAL_BASED_OUTPATIENT_CLINIC_OR_DEPARTMENT_OTHER)
Admission: EM | Admit: 2015-10-12 | Discharge: 2015-10-13 | Disposition: A | Discharge status: Home / Self Care | Payer: 59 | Attending: Emergency Medicine | Admitting: Emergency Medicine

## 2015-10-12 ENCOUNTER — Other Ambulatory Visit: Payer: Self-pay | Admitting: Medical

## 2015-10-12 ENCOUNTER — Encounter (HOSPITAL_BASED_OUTPATIENT_CLINIC_OR_DEPARTMENT_OTHER): Payer: Self-pay | Admitting: Emergency Medicine

## 2015-10-12 ENCOUNTER — Encounter: Payer: Self-pay | Admitting: Medical

## 2015-10-12 DIAGNOSIS — Z8632 Personal history of gestational diabetes: Secondary | ICD-10-CM | POA: Diagnosis not present

## 2015-10-12 DIAGNOSIS — Z792 Long term (current) use of antibiotics: Secondary | ICD-10-CM | POA: Insufficient documentation

## 2015-10-12 DIAGNOSIS — K219 Gastro-esophageal reflux disease without esophagitis: Secondary | ICD-10-CM | POA: Insufficient documentation

## 2015-10-12 DIAGNOSIS — Z862 Personal history of diseases of the blood and blood-forming organs and certain disorders involving the immune mechanism: Secondary | ICD-10-CM | POA: Diagnosis not present

## 2015-10-12 DIAGNOSIS — R51 Headache: Secondary | ICD-10-CM | POA: Diagnosis not present

## 2015-10-12 DIAGNOSIS — R519 Headache, unspecified: Secondary | ICD-10-CM

## 2015-10-12 DIAGNOSIS — R011 Cardiac murmur, unspecified: Secondary | ICD-10-CM | POA: Diagnosis not present

## 2015-10-12 DIAGNOSIS — Z872 Personal history of diseases of the skin and subcutaneous tissue: Secondary | ICD-10-CM | POA: Diagnosis not present

## 2015-10-12 DIAGNOSIS — Z7982 Long term (current) use of aspirin: Secondary | ICD-10-CM | POA: Diagnosis not present

## 2015-10-12 DIAGNOSIS — J209 Acute bronchitis, unspecified: Secondary | ICD-10-CM

## 2015-10-12 DIAGNOSIS — R0602 Shortness of breath: Secondary | ICD-10-CM | POA: Diagnosis present

## 2015-10-12 DIAGNOSIS — Z79899 Other long term (current) drug therapy: Secondary | ICD-10-CM | POA: Insufficient documentation

## 2015-10-12 MED ORDER — IPRATROPIUM-ALBUTEROL 0.5-2.5 (3) MG/3ML IN SOLN
3.0000 mL | RESPIRATORY_TRACT | Status: DC
  Administered 2015-10-12: 3 mL via RESPIRATORY_TRACT

## 2015-10-12 MED ORDER — ALBUTEROL SULFATE (2.5 MG/3ML) 0.083% IN NEBU
INHALATION_SOLUTION | RESPIRATORY_TRACT | Status: AC
  Filled 2015-10-12: qty 3

## 2015-10-12 MED ORDER — IBUPROFEN 800 MG PO TABS
800.0000 mg | ORAL_TABLET | Freq: Once | ORAL | Status: AC
  Administered 2015-10-12: 800 mg via ORAL
  Filled 2015-10-12: qty 2

## 2015-10-12 MED ORDER — IPRATROPIUM-ALBUTEROL 0.5-2.5 (3) MG/3ML IN SOLN
RESPIRATORY_TRACT | Status: AC
  Filled 2015-10-12: qty 3

## 2015-10-12 NOTE — ED Notes (Signed)
Patient states that she started to have a HA about 2 -3 hours ago. The patient then reports that about an hour ago she started to have some shortness of breath and neck soreness. Denies any trouble breathing or swallowing at this time.

## 2015-10-12 NOTE — ED Provider Notes (Signed)
CSN: [id number]     Arrival date & time 10/12/15  2219 History  By signing my name below, I, Helane Gunther, attest that this documentation has been prepared under the direction and in the presence of Shanon Rosser, MD. Electronically Signed: Helane Gunther, ED Scribe. 10/12/2015. 11:48 PM.     Chief Complaint  Patient presents with  . Shortness of Breath   The history is provided by the patient. No language interpreter was used.   HPI Comments: Ann Frederick is a 44 y.o. female who presents to the Emergency Department complaining of SOB onset 1 hour ago. Pt states she began having a constant, throbbing frontal HA starting 3 hours ago, when she took her BP at 140/80. She states she took 2 baby aspirin and imitrex with no relief. She reports that her HA then spread to the back of her head and she notes her neck began to feel stiff, and her chest began feeling tight, causing her to feel as though she was short of breath. Symtoms are mild to moderate. She has no history of asthma. She currently rates her HA as a 7/10. Pt denies sore throat, nausea and blurred vision.   Past Medical History  Diagnosis Date  . Acne   . Heart murmur     ? heart murmur in past  . GERD (gastroesophageal reflux disease)     worse while pregnant  . Anemia   . Diabetes mellitus     gest this pregnancy   Past Surgical History  Procedure Laterality Date  . Cesarean section      x 2  . Esophagogastroduodenoscopy  normal     7/12  . Cesarean section  09/29/2012    Procedure: CESAREAN SECTION;  Surgeon: Luz Lex, MD;  Location: [facility];  Service: Obstetrics;  Laterality: N/A;  Repeat edc 10/12/12/REQUEST;Chassity,Dee,Colleen   Family History  Problem Relation Age of Onset  . Depression Mother   . Hypertension Mother   . Diabetes Mother   . Obesity Mother   . Thyroid disease Mother   . Cancer Father     ? lung CA  . Kidney disease Father   . Hydrocephalus Sister   . Colon cancer Neg Hx    Social  History  Substance Use Topics  . Smoking status: Never Smoker   . Smokeless tobacco: Never Used  . Alcohol Use: Yes     Comment: 1 glass of wine per week prior to preg   OB History    Gravida Para Term Preterm AB TAB SAB Ectopic Multiple Living   7 3 3  0 4 1 3  0 0 3     Review of Systems  All other systems reviewed and are negative.   Allergies  Azithromycin  Home Medications   Prior to Admission medications   Medication Sig Start Date End Date Taking? Authorizing Provider  ALPRAZolam Duanne Moron) 0.5 MG tablet Take 0.5 mg by mouth at bedtime as needed for anxiety.    Historical Provider, MD  aspirin 81 MG tablet Take 81 mg by mouth daily.    Historical Provider, MD  b complex vitamins capsule Take 1 capsule by mouth daily.    Historical Provider, MD  clindamycin (CLINDAGEL) 1 % gel Apply 1 application topically daily. Use as directed.    Historical Provider, MD  FLUoxetine (PROZAC) 10 MG capsule Take 10 mg by mouth at bedtime.    Historical Provider, MD  magnesium 30 MG tablet Take 30 mg by mouth 2 (  two) times daily.    Historical Provider, MD  omeprazole (PRILOSEC) 40 MG capsule TAKE 1 CAPSULE (40 MG TOTAL) BY MOUTH DAILY. 12/19/14   Midge Minium, MD  SUMAtriptan (IMITREX) 50 MG tablet Take 1 tablet (50 mg total) by mouth every 2 (two) hours as needed for migraine. May repeat in 2 hours if headache persists or recurs. 09/15/15   [name] Saguier, PA-C  tizanidine (ZANAFLEX) 6 MG capsule 1 tab po q hs prn  HA with trapezius myalgia 09/15/15   Mackie Pai, PA-C  Vitamin D, Ergocalciferol, (DRISDOL) 50000 UNITS CAPS capsule Take 1 capsule (50,000 Units total) by mouth every 7 (seven) days. 05/20/15   Midge Minium, MD  zolpidem (AMBIEN) 10 MG tablet Take 10 mg by mouth as needed.  03/24/15   Historical Provider, MD   BP 172/96 mmHg  Pulse 83  Temp(Src) 98.1 F (36.7 C) (Oral)  Resp 18  Ht 5\' 2"  (1.575 m)  Wt 176 lb (79.833 kg)  BMI 32.18 kg/m2  SpO2 99%  LMP 10/09/2015    Physical Exam General: Well-developed, well-nourished female in no acute distress; appearance consistent with age of record HENT: normocephalic; atraumatic Eyes: pupils equal, round and reactive to light; extraocular muscles intact Neck: supple Heart: regular rate and rhythm; no murmurs, rubs or gallops Lungs: Decreased air movement bilaterally without wheezing Abdomen: soft; nondistended; nontender; no masses or hepatosplenomegaly; bowel sounds present Extremities: No deformity; full range of motion; pulses normal Neurologic: Awake, alert and oriented; motor function intact in all extremities and symmetric; no facial droop Skin: Warm and dry Psychiatric: Normal mood and affect   ED Course  Procedures   MDM  12:10 AM Air movement improved, chest tightness resolved after DuoNeb treatment.    Shanon Rosser, MD 10/13/15 0010

## 2015-10-13 MED ORDER — ALBUTEROL SULFATE HFA 108 (90 BASE) MCG/ACT IN AERS
2.0000 | INHALATION_SPRAY | RESPIRATORY_TRACT | Status: DC | PRN
  Administered 2015-10-13: 2 via RESPIRATORY_TRACT
  Filled 2015-10-13: qty 6.7

## 2015-10-13 NOTE — Telephone Encounter (Signed)
Ann Frederick please advise on refill.  Pt was last seen on 09/15/2015.

## 2015-10-14 ENCOUNTER — Ambulatory Visit (INDEPENDENT_AMBULATORY_CARE_PROVIDER_SITE_OTHER): Payer: 59 | Admitting: Medical

## 2015-10-14 ENCOUNTER — Encounter: Payer: Self-pay | Admitting: Medical

## 2015-10-14 VITALS — BP 126/86 | HR 76 | Temp 98.1°F | Resp 16 | Ht 62.25 in | Wt 178.4 lb

## 2015-10-14 DIAGNOSIS — I10 Essential (primary) hypertension: Secondary | ICD-10-CM | POA: Diagnosis not present

## 2015-10-14 LAB — COMPREHENSIVE METABOLIC PANEL
ALK PHOS: 61 U/L (ref 39–117)
ALT: 12 U/L (ref 0–35)
AST: 14 U/L (ref 0–37)
Albumin: 3.7 g/dL (ref 3.5–5.2)
BUN: 8 mg/dL (ref 6–23)
CHLORIDE: 105 meq/L (ref 96–112)
CO2: 28 meq/L (ref 19–32)
Calcium: 9.2 mg/dL (ref 8.4–10.5)
Creatinine, Ser: 0.64 mg/dL (ref 0.40–1.20)
GFR: 129.63 mL/min (ref >60.00)
GLUCOSE: 97 mg/dL (ref 70–99)
POTASSIUM: 4 meq/L (ref 3.5–5.1)
SODIUM: 137 meq/L (ref 135–145)
TOTAL PROTEIN: 7.1 g/dL (ref 6.0–8.3)
Total Bilirubin: 0.5 mg/dL (ref 0.2–1.2)

## 2015-10-14 LAB — LIPID PANEL
Cholesterol: 145 mg/dL (ref 0–200)
HDL: 42.4 mg/dL (ref >39.00)
LDL CALC: 82 mg/dL (ref 0–99)
NONHDL: 102.27
Total CHOL/HDL Ratio: 3
Triglycerides: 100 mg/dL (ref 0.0–149.0)
VLDL: 20 mg/dL (ref 0.0–40.0)

## 2015-10-14 MED ORDER — HYDROCHLOROTHIAZIDE 12.5 MG PO CAPS: 12.5000 mg | ORAL_CAPSULE | Freq: Every day | ORAL | Status: DC

## 2015-10-14 MED ORDER — TIZANIDINE HCL 6 MG PO CAPS: ORAL_CAPSULE | ORAL | Status: DC

## 2015-10-14 MED ORDER — SUMATRIPTAN SUCCINATE 50 MG PO TABS: 50.0000 mg | ORAL_TABLET | ORAL | Status: DC | PRN

## 2015-10-14 NOTE — Patient Instructions (Signed)
Your blood pressure is fairly controlled today but in past visits bp have been high and in ED was quite high. I will start you on low dose diuretic today. While on would remind you to keep k up/eat banana about every other day.   Keep bp log and document reading.  Get cmp and lipid panel today.  Follow up in 2 wks or as needed

## 2015-10-14 NOTE — Progress Notes (Signed)
Pre visit review using our clinic review tool, if applicable. No additional management support is needed unless otherwise documented below in the visit note. 

## 2015-10-14 NOTE — Telephone Encounter (Signed)
Refill of her pt meds for ha. Tension vs migraine.

## 2015-10-14 NOTE — Progress Notes (Signed)
Subjective:    Patient ID: Ann Frederick, female    DOB: 01-05-1971, 44 y.o.   MRN: [id number]  HPI   Pt in for follow up. Pt sent email to me the other day. Then after e-mail her blood pressure increased and mild ha. Then she had some slight shortness of breath. Then she went to ED. Her ekg looked ok. They gave her neb treatment. She had possible faint sob. Then she was discharge. No current cardiac or pulmonary symptoms.  Pt was hesitant to be on bp medication in the past. She is now willing to be on bp medication.  Pt still getting occasional headache as before but no current ha now on review.   Review of Systems  Constitutional: Negative for fever, chills, diaphoresis, activity change and fatigue.  Respiratory: Negative for cough, chest tightness and shortness of breath.   Cardiovascular: Negative for chest pain, palpitations and leg swelling.  Gastrointestinal: Negative for nausea, vomiting and abdominal pain.  Musculoskeletal: Negative for neck pain and neck stiffness.  Neurological: Negative for dizziness, seizures, weakness and headaches.  Psychiatric/Behavioral: Negative for behavioral problems, confusion and agitation. The patient is not nervous/anxious.     Past Medical History  Diagnosis Date  . Acne   . Heart murmur     ? heart murmur in past  . GERD (gastroesophageal reflux disease)     worse while pregnant  . Anemia   . Diabetes mellitus     gest this pregnancy    Social History   Social History  . Marital Status: Married    Spouse Name: N/A  . Number of Children: 2  . Years of Education: N/A   Occupational History  . Not on file.   Social History Main Topics  . Smoking status: Never Smoker   . Smokeless tobacco: Never Used  . Alcohol Use: Yes     Comment: 1 glass of wine per week prior to preg  . Drug Use: No  . Sexual Activity: Yes   Other Topics Concern  . Not on file   Social History Narrative    Past Surgical History  Procedure  Laterality Date  . Cesarean section      x 2  . Esophagogastroduodenoscopy  normal     7/12  . Cesarean section  09/29/2012    Procedure: CESAREAN SECTION;  Surgeon: Luz Lex, MD;  Location: [facility];  Service: Obstetrics;  Laterality: N/A;  Repeat edc 10/12/12/REQUEST;Chassity,Dee,Colleen    Family History  Problem Relation Age of Onset  . Depression Mother   . Hypertension Mother   . Diabetes Mother   . Obesity Mother   . Thyroid disease Mother   . Cancer Father     ? lung CA  . Kidney disease Father   . Hydrocephalus Sister   . Colon cancer Neg Hx     Allergies  Allergen Reactions  . Azithromycin Nausea And Vomiting    Current Outpatient Prescriptions on File Prior to Visit  Medication Sig Dispense Refill  . ALPRAZolam (XANAX) 0.5 MG tablet Take 0.5 mg by mouth at bedtime as needed for anxiety.    Marland Kitchen aspirin 81 MG tablet Take 81 mg by mouth daily.    Marland Kitchen b complex vitamins capsule Take 1 capsule by mouth daily.    . clindamycin (CLINDAGEL) 1 % gel Apply 1 application topically daily. Use as directed.    Marland Kitchen FLUoxetine (PROZAC) 10 MG capsule Take 10 mg by mouth at bedtime.    Marland Kitchen  magnesium 30 MG tablet Take 30 mg by mouth 2 (two) times daily.    Marland Kitchen omeprazole (PRILOSEC) 40 MG capsule TAKE 1 CAPSULE (40 MG TOTAL) BY MOUTH DAILY. 30 capsule 3  . SUMAtriptan (IMITREX) 50 MG tablet Take 1 tablet (50 mg total) by mouth every 2 (two) hours as needed for migraine. May repeat in 2 hours if headache persists or recurs. 10 tablet 0  . tizanidine (ZANAFLEX) 6 MG capsule 1 tab po q hs prn  HA with trapezius myalgia 7 capsule 0  . zolpidem (AMBIEN) 10 MG tablet Take 10 mg by mouth as needed.   5  . [DISCONTINUED] pantoprazole (PROTONIX) 40 MG tablet Take 40 mg by mouth daily before breakfast.       No current facility-administered medications on file prior to visit.    BP 126/86 mmHg  Pulse 76  Temp(Src) 98.1 F (36.7 C) (Oral)  Resp 16  Ht 5' 2.25" (1.581 m)  Wt 178 lb 6.4 oz  (80.922 kg)  BMI 32.37 kg/m2  SpO2 98%  LMP 10/09/2015      Objective:   Physical Exam  General Mental Status- Alert. General Appearance- Not in acute distress.   Skin General: Color- Normal Color. Moisture- Normal Moisture.  Neck Carotid Arteries- Normal color. Moisture- Normal Moisture. No carotid bruits. No JVD.  Chest and Lung Exam Auscultation: Breath Sounds:-Normal. CTA.  Cardiovascular Auscultation:Rythm- Regular, Rate and Rhythm. Murmurs & Other Heart Sounds:Auscultation of the heart reveals- No Murmurs.   Neurologic Cranial Nerve exam:- CN III-XII intact(No nystagmus), symmetric smile. Strength:- 5/5 equal and symmetric strength both upper and lower extremities.     Assessment & Plan:  Your blood pressure is fairly controlled today but in past visits bp have been high and in ED was quite high. I will start you on low dose diuretic today. While on would remind you to keep k up/eat banana about every other day.   Keep bp log and document reading.  Get cmp and lipid panel today.  Follow up in 2 wks or as needed

## 2015-10-17 ENCOUNTER — Encounter: Payer: Self-pay | Admitting: Medical

## 2015-10-28 ENCOUNTER — Encounter: Payer: Self-pay | Admitting: Medical

## 2015-10-28 ENCOUNTER — Ambulatory Visit: Payer: 59 | Admitting: Family Medicine

## 2015-10-28 ENCOUNTER — Ambulatory Visit (INDEPENDENT_AMBULATORY_CARE_PROVIDER_SITE_OTHER): Payer: 59 | Admitting: Medical

## 2015-10-28 VITALS — BP 118/75 | HR 67 | Temp 97.8°F | Ht 65.25 in | Wt 175.4 lb

## 2015-10-28 DIAGNOSIS — R51 Headache: Secondary | ICD-10-CM

## 2015-10-28 DIAGNOSIS — I10 Essential (primary) hypertension: Secondary | ICD-10-CM

## 2015-10-28 DIAGNOSIS — R519 Headache, unspecified: Secondary | ICD-10-CM

## 2015-10-28 MED ORDER — CYCLOBENZAPRINE HCL 5 MG PO TABS: ORAL_TABLET | ORAL | Status: DC

## 2015-10-28 MED ORDER — HYDROCHLOROTHIAZIDE 12.5 MG PO CAPS: 12.5000 mg | ORAL_CAPSULE | Freq: Every day | ORAL | Status: DC

## 2015-10-28 NOTE — Progress Notes (Signed)
Pre visit review using our clinic review tool, if applicable. No additional management support is needed unless otherwise documented below in the visit note. 

## 2015-10-28 NOTE — Patient Instructions (Addendum)
Your blood pressure is well controlled today. Will continue hctz. Remember to supplement k level with  bananas.  For your ha would use flexeril for tension ha if occurs at night. Rx of flexeril.   Follow up 3 month or as needed

## 2015-10-28 NOTE — Progress Notes (Signed)
Subjective:    Patient ID: Ann Frederick, female    DOB: 11-17-1971, 44 y.o.   MRN: [id number]  HPI  Pt in today for her blood pressure check. Pt states her bp did gradually start to come down. After about one week bp has been in 120/70-80 range consistently.  Pt blood pressure today is 110/70.  Pt is eating healthier and exercising.  No cardiac or neurologic signs or symptoms.  Regarding prior ha. She is only getting ha now 1 time a week now(last one about a week ago). Was much more frequent in pat. Last ha did respond to zanaflex. But it made her to drowsy. She prefers to use flexeril. It does not make her too drowsy.  Pt thinks some of prior ha were related to her blood pressure levels.     Review of Systems  Constitutional: Negative for fever, chills, diaphoresis, activity change and fatigue.  Respiratory: Negative for cough, chest tightness and shortness of breath.   Cardiovascular: Negative for chest pain, palpitations and leg swelling.  Gastrointestinal: Negative for nausea, vomiting and abdominal pain.  Musculoskeletal: Negative for neck pain and neck stiffness.  Neurological: Negative for dizziness, facial asymmetry, speech difficulty, weakness and numbness.  Hematological: Negative for adenopathy. Does not bruise/bleed easily.  Psychiatric/Behavioral: Negative for behavioral problems, confusion and agitation. The patient is not nervous/anxious.     Past Medical History  Diagnosis Date  . Acne   . Heart murmur     ? heart murmur in past  . GERD (gastroesophageal reflux disease)     worse while pregnant  . Anemia   . Diabetes mellitus     gest this pregnancy    Social History   Social History  . Marital Status: Married    Spouse Name: N/A  . Number of Children: 2  . Years of Education: N/A   Occupational History  . Not on file.   Social History Main Topics  . Smoking status: Never Smoker   . Smokeless tobacco: Never Used  . Alcohol Use: Yes   Comment: 1 glass of wine per week prior to preg  . Drug Use: No  . Sexual Activity: Yes   Other Topics Concern  . Not on file   Social History Narrative    Past Surgical History  Procedure Laterality Date  . Cesarean section      x 2  . Esophagogastroduodenoscopy  normal     7/12  . Cesarean section  09/29/2012    Procedure: CESAREAN SECTION;  Surgeon: Luz Lex, MD;  Location: [facility];  Service: Obstetrics;  Laterality: N/A;  Repeat edc 10/12/12/REQUEST;Chassity,Dee,Colleen    Family History  Problem Relation Age of Onset  . Depression Mother   . Hypertension Mother   . Diabetes Mother   . Obesity Mother   . Thyroid disease Mother   . Cancer Father     ? lung CA  . Kidney disease Father   . Hydrocephalus Sister   . Colon cancer Neg Hx     Allergies  Allergen Reactions  . Azithromycin Nausea And Vomiting    Current Outpatient Prescriptions on File Prior to Visit  Medication Sig Dispense Refill  . ALPRAZolam (XANAX) 0.5 MG tablet Take 0.5 mg by mouth at bedtime as needed for anxiety.    Marland Kitchen aspirin 81 MG tablet Take 81 mg by mouth daily.    Marland Kitchen b complex vitamins capsule Take 1 capsule by mouth daily.    . clindamycin (CLINDAGEL)  1 % gel Apply 1 application topically daily. Use as directed.    Marland Kitchen FLUoxetine (PROZAC) 10 MG capsule Take 10 mg by mouth at bedtime.    . hydrochlorothiazide (MICROZIDE) 12.5 MG capsule Take 1 capsule (12.5 mg total) by mouth daily. 30 capsule 0  . magnesium 30 MG tablet Take 30 mg by mouth 2 (two) times daily.    Marland Kitchen omeprazole (PRILOSEC) 40 MG capsule TAKE 1 CAPSULE (40 MG TOTAL) BY MOUTH DAILY. 30 capsule 3  . SUMAtriptan (IMITREX) 50 MG tablet Take 1 tablet (50 mg total) by mouth every 2 (two) hours as needed for migraine. May repeat in 2 hours if headache persists or recurs. 10 tablet 0  . tizanidine (ZANAFLEX) 6 MG capsule 1 tab po q hs prn  HA with trapezius myalgia 7 capsule 0  . zolpidem (AMBIEN) 10 MG tablet Take 10 mg by mouth as  needed.   5  . [DISCONTINUED] pantoprazole (PROTONIX) 40 MG tablet Take 40 mg by mouth daily before breakfast.       No current facility-administered medications on file prior to visit.    BP 110/70 mmHg  Pulse 67  Temp(Src) 97.8 F (36.6 C) (Oral)  Ht 5' 5.25" (1.657 m)  Wt 175 lb 6.4 oz (79.561 kg)  BMI 28.98 kg/m2  SpO2 98%  LMP 10/09/2015       Objective:   Physical Exam  General Mental Status- Alert. General Appearance- Not in acute distress.    Chest and Lung Exam Auscultation: Breath Sounds:-Normal. CTA.  Cardiovascular Auscultation:Rythm- Regular, Rate, and Rhythm. Murmurs & Other Heart Sounds:Auscultation of the heart reveals- No Murmurs.  Abdomen Inspection:-Inspeection Normal. Palpation/Percussion:Note:No mass. Palpation and Percussion of the abdomen reveal- Non Tender, Non Distended + BS, no rebound or guarding.   Neurologic Cranial Nerve exam:- CN III-XII intact(No nystagmus), symmetric smile. Strength:- 5/5 equal and symmetric strength both upper and lower extremities.      Assessment & Plan:  Your blood pressure is well controlled today. Will continue hctz. Remember to supplement k level with bananas  For your ha would use flexeril for tension ha if occurs at night. Rx of flexeril.   Follow up 3 month or as needed  Note will follow her ha and if more frequent or worsening will considerimaging or ct of head. But presently much improved.

## 2015-11-11 ENCOUNTER — Other Ambulatory Visit: Payer: Self-pay

## 2015-11-11 MED ORDER — HYDROCHLOROTHIAZIDE 12.5 MG PO CAPS: 12.5000 mg | ORAL_CAPSULE | Freq: Every day | ORAL | Status: DC

## 2015-12-10 ENCOUNTER — Ambulatory Visit (INDEPENDENT_AMBULATORY_CARE_PROVIDER_SITE_OTHER): Payer: 59 | Admitting: Medical

## 2015-12-10 ENCOUNTER — Encounter: Payer: Self-pay | Admitting: Medical

## 2015-12-10 VITALS — BP 118/78 | HR 87 | Temp 98.2°F | Ht 65.25 in | Wt 178.8 lb

## 2015-12-10 DIAGNOSIS — R0981 Nasal congestion: Secondary | ICD-10-CM | POA: Diagnosis not present

## 2015-12-10 DIAGNOSIS — H6691 Otitis media, unspecified, right ear: Secondary | ICD-10-CM | POA: Diagnosis not present

## 2015-12-10 DIAGNOSIS — J029 Acute pharyngitis, unspecified: Secondary | ICD-10-CM | POA: Diagnosis not present

## 2015-12-10 LAB — POCT RAPID STREP A (OFFICE): RAPID STREP A SCREEN: NEGATIVE

## 2015-12-10 MED ORDER — CEFDINIR 300 MG PO CAPS: 300.0000 mg | ORAL_CAPSULE | Freq: Two times a day (BID) | ORAL | Status: DC

## 2015-12-10 NOTE — Patient Instructions (Addendum)
For nasal congestion you could use claritin since that helped you last night. You don't like nasal sprays so won't rx.  Otitis media by exam and you have been exposed to strep despite neg rapid strep. Recommend going ahead and starting cefdnir antibiotic.  Follow up 7-10 days or as needed

## 2015-12-10 NOTE — Progress Notes (Signed)
Subjective:    Patient ID: Ann Frederick, female    DOB: Jul 15, 1971, 44 y.o.   MRN: [id number]  HPI  Pt in with 2 family members with strep throat. She has some pnd and some achiness. Pt has been taking care of son and husband with strep. Pt has felt some subjective chills. No recorded fevers.  Pt has mild pain.  Review of Systems  Constitutional: Positive for chills and fatigue.  HENT: Positive for congestion. Negative for ear pain and sinus pressure.   Respiratory: Negative for cough, chest tightness, shortness of breath and wheezing.   Cardiovascular: Negative for chest pain and palpitations.  Musculoskeletal: Positive for myalgias. Negative for arthralgias.  Skin: Negative for rash.  Neurological: Negative for dizziness and headaches.  Hematological: Positive for adenopathy. Does not bruise/bleed easily.       Mild submandibular lymph nodes.  Psychiatric/Behavioral: Negative for behavioral problems and confusion.    Past Medical History  Diagnosis Date  . Acne   . Heart murmur     ? heart murmur in past  . GERD (gastroesophageal reflux disease)     worse while pregnant  . Anemia   . Diabetes mellitus     gest this pregnancy    Social History   Social History  . Marital Status: Married    Spouse Name: N/A  . Number of Children: 2  . Years of Education: N/A   Occupational History  . Not on file.   Social History Main Topics  . Smoking status: Never Smoker   . Smokeless tobacco: Never Used  . Alcohol Use: Yes     Comment: 1 glass of wine per week prior to preg  . Drug Use: No  . Sexual Activity: Yes   Other Topics Concern  . Not on file   Social History Narrative    Past Surgical History  Procedure Laterality Date  . Cesarean section      x 2  . Esophagogastroduodenoscopy  normal     7/12  . Cesarean section  09/29/2012    Procedure: CESAREAN SECTION;  Surgeon: Luz Lex, MD;  Location: [facility];  Service: Obstetrics;  Laterality: N/A;   Repeat edc 10/12/12/REQUEST;Chassity,Dee,Colleen    Family History  Problem Relation Age of Onset  . Depression Mother   . Hypertension Mother   . Diabetes Mother   . Obesity Mother   . Thyroid disease Mother   . Cancer Father     ? lung CA  . Kidney disease Father   . Hydrocephalus Sister   . Colon cancer Neg Hx     Allergies  Allergen Reactions  . Azithromycin Nausea And Vomiting    Current Outpatient Prescriptions on File Prior to Visit  Medication Sig Dispense Refill  . ALPRAZolam (XANAX) 0.5 MG tablet Take 0.5 mg by mouth at bedtime as needed for anxiety.    Marland Kitchen aspirin 81 MG tablet Take 81 mg by mouth daily.    Marland Kitchen b complex vitamins capsule Take 1 capsule by mouth daily.    . clindamycin (CLINDAGEL) 1 % gel Apply 1 application topically daily. Use as directed.    . cyclobenzaprine (FLEXERIL) 5 MG tablet 1 tab po prn tension ha 20 tablet 0  . FLUoxetine (PROZAC) 10 MG capsule Take 10 mg by mouth at bedtime.    . hydrochlorothiazide (MICROZIDE) 12.5 MG capsule Take 1 capsule (12.5 mg total) by mouth daily. 90 capsule 1  . magnesium 30 MG tablet Take 30 mg  by mouth 2 (two) times daily.    Marland Kitchen omeprazole (PRILOSEC) 40 MG capsule TAKE 1 CAPSULE (40 MG TOTAL) BY MOUTH DAILY. 30 capsule 3  . SUMAtriptan (IMITREX) 50 MG tablet Take 1 tablet (50 mg total) by mouth every 2 (two) hours as needed for migraine. May repeat in 2 hours if headache persists or recurs. 10 tablet 0  . tizanidine (ZANAFLEX) 6 MG capsule 1 tab po q hs prn  HA with trapezius myalgia 7 capsule 0  . zolpidem (AMBIEN) 10 MG tablet Take 10 mg by mouth as needed.   5  . [DISCONTINUED] pantoprazole (PROTONIX) 40 MG tablet Take 40 mg by mouth daily before breakfast.       No current facility-administered medications on file prior to visit.    BP 118/78 mmHg  Pulse 87  Temp(Src) 98.2 F (36.8 C) (Oral)  Ht 5' 5.25" (1.657 m)  Wt 178 lb 12.8 oz (81.103 kg)  BMI 29.54 kg/m2  SpO2 98%       Objective:    Physical Exam  General  Mental Status - Alert. General Appearance - Well groomed. Not in acute distress.  Skin Rashes- No Rashes.  HEENT Head- Normal. Ear Auditory Canal - Left- Normal. Right - Normal.Tympanic Membrane- Left- Normal. Right- mild red Eye Sclera/Conjunctiva- Left- Normal. Right- Normal. Nose & Sinuses Nasal Mucosa- Left-  Boggy and Congested. Right-  Boggy and  Congested.Bilateral  No maxillary and no  frontal sinus pressure. Mouth & Throat Lips: Upper Lip- Normal: no dryness, cracking, pallor, cyanosis, or vesicular eruption. Lower Lip-Normal: no dryness, cracking, pallor, cyanosis or vesicular eruption. Buccal Mucosa- Bilateral- No Aphthous ulcers. Oropharynx- No Discharge or Erythema. Tonsils: Characteristics- Bilateral- mild  Erythema and  Congestion. Size/Enlargement- Bilateral- No enlargement. Discharge- bilateral-None.  Neck Neck- Supple. No Masses. Lt sub m-node mild enlarged.   Chest and Lung Exam Auscultation: Breath Sounds:-Clear even and unlabored.  Cardiovascular Auscultation:Rythm- Regular, rate and rhythm. Murmurs & Other Heart Sounds:Ausculatation of the heart reveal- No Murmurs.  Lymphatic Head & Neck General Head & Neck Lymphatics: Bilateral: Description- No Localized lymphadenopathy. See neck lymph node described.       Assessment & Plan:  For nasal congestion you could use claritin since that helped you last night. You don't like nasal sprays so won't rx.  Otitis media by exam and you have been exposed to strep despite neg rapid strep. Recommend going ahead and starting cefdnir antibiotic.  Follow up 7-10 days or as needed

## 2015-12-10 NOTE — Progress Notes (Signed)
Pre visit review using our clinic review tool, if applicable. No additional management support is needed unless otherwise documented below in the visit note. 

## 2016-01-26 ENCOUNTER — Other Ambulatory Visit: Payer: Self-pay

## 2016-01-26 MED ORDER — CYCLOBENZAPRINE HCL 5 MG PO TABS: ORAL_TABLET | ORAL | Status: DC

## 2016-01-26 MED ORDER — SUMATRIPTAN SUCCINATE 50 MG PO TABS: 50.0000 mg | ORAL_TABLET | ORAL | Status: DC | PRN

## 2016-01-26 NOTE — Telephone Encounter (Signed)
Will you clarify that she wants these to go to mail order pharmacy or local .If daily med I usually can send mail order. Both of these are periodic meds. I can refill but wanted to clarify send to local pharmacy or mail order

## 2016-01-26 NOTE — Telephone Encounter (Signed)
Spoke with pt and Rx sent in to mail order pharmacy per pt request.

## 2016-01-26 NOTE — Telephone Encounter (Signed)
Pt was last seen 12/10/15. Last refill on Sumatriptan 10/14/15. Last refill on cyclobenzaprine was 10/28/15.  Please advise on refill.

## 2016-01-28 ENCOUNTER — Ambulatory Visit (INDEPENDENT_AMBULATORY_CARE_PROVIDER_SITE_OTHER): Payer: 59 | Admitting: Medical

## 2016-01-28 ENCOUNTER — Ambulatory Visit: Payer: 59 | Admitting: Family Medicine

## 2016-01-28 VITALS — BP 120/80 | HR 79 | Temp 98.1°F | Ht 65.0 in | Wt 184.8 lb

## 2016-01-28 DIAGNOSIS — R51 Headache: Secondary | ICD-10-CM

## 2016-01-28 DIAGNOSIS — K219 Gastro-esophageal reflux disease without esophagitis: Secondary | ICD-10-CM

## 2016-01-28 DIAGNOSIS — R519 Headache, unspecified: Secondary | ICD-10-CM

## 2016-01-28 MED ORDER — OMEPRAZOLE 20 MG PO CPDR: 20.0000 mg | DELAYED_RELEASE_CAPSULE | Freq: Every day | ORAL | Status: DC

## 2016-01-28 NOTE — Patient Instructions (Addendum)
For your migraine ha and possible tension ha as well continue imitrex and flexeril. Rx sent yesterday by MA Barnet Pall.  For gerd continue omeprazole. Rx written today.   Follow up as already scheduled for physical and fasting labs.

## 2016-01-28 NOTE — Progress Notes (Signed)
Subjective:    Patient ID: Ann Frederick, female    DOB: September 24, 1971, 45 y.o.   MRN: [id number]  HPI   Pt wants cyclobenzaprine and imitrex sent to mail order. Barnet Pall MA did send in rx yesterday.   Pt has some gerd symptoms. She states has to take  Omeprazole tablets and if does help a lot. If she misses tabs will have terrible reflux/keep her up at night.  Pt migraines have been controlled. Pt has had 2 migraines since the last visit. Pt also takes cyclobenzaprine about 2 times a month for ha at night with trapezius tenderness.  Pt is getting hysterectomy end of this month. Pt not getting ovaries removed. Pt has known fibroids. 11 months ago she did not have anemia.  Pt up to date on flu vaccine. Got flu vaccine for this flu season.  Pt had mammogram about 2 wks ago. She does not have results yet.     Review of Systems  Constitutional: Negative for fever, chills, diaphoresis, activity change and fatigue.  HENT: Negative for dental problem, drooling, sneezing and tinnitus.   Respiratory: Negative for cough, chest tightness and shortness of breath.   Cardiovascular: Negative for chest pain, palpitations and leg swelling.  Gastrointestinal: Negative for nausea, vomiting and abdominal pain.       Gerd controlled with omeprazole.  Musculoskeletal: Negative for back pain, neck pain and neck stiffness.       Occasoinal trapezius tight at end of day with mild ha.  Neurological: Negative for dizziness, tremors, weakness, numbness and headaches.       No ha currently. Hpi.  Psychiatric/Behavioral: Negative for behavioral problems, confusion and agitation. The patient is not nervous/anxious.     Past Medical History  Diagnosis Date  . Acne   . Heart murmur     ? heart murmur in past  . GERD (gastroesophageal reflux disease)     worse while pregnant  . Anemia   . Diabetes mellitus     gest this pregnancy    Social History   Social History  . Marital Status: Married    Spouse  Name: N/A  . Number of Children: 2  . Years of Education: N/A   Occupational History  . Not on file.   Social History Main Topics  . Smoking status: Never Smoker   . Smokeless tobacco: Never Used  . Alcohol Use: Yes     Comment: 1 glass of wine per week prior to preg  . Drug Use: No  . Sexual Activity: Yes   Other Topics Concern  . Not on file   Social History Narrative    Past Surgical History  Procedure Laterality Date  . Cesarean section      x 2  . Esophagogastroduodenoscopy  normal     7/12  . Cesarean section  09/29/2012    Procedure: CESAREAN SECTION;  Surgeon: Luz Lex, MD;  Location: [facility];  Service: Obstetrics;  Laterality: N/A;  Repeat edc 10/12/12/REQUEST;Chassity,Dee,Colleen    Family History  Problem Relation Age of Onset  . Depression Mother   . Hypertension Mother   . Diabetes Mother   . Obesity Mother   . Thyroid disease Mother   . Cancer Father     ? lung CA  . Kidney disease Father   . Hydrocephalus Sister   . Colon cancer Neg Hx     Allergies  Allergen Reactions  . Azithromycin Nausea And Vomiting    Current Outpatient Prescriptions  on File Prior to Visit  Medication Sig Dispense Refill  . ALPRAZolam (XANAX) 0.5 MG tablet Take 0.5 mg by mouth at bedtime as needed for anxiety.    Marland Kitchen aspirin 81 MG tablet Take 81 mg by mouth daily.    Marland Kitchen b complex vitamins capsule Take 1 capsule by mouth daily.    . clindamycin (CLINDAGEL) 1 % gel Apply 1 application topically daily. Use as directed.    . cyclobenzaprine (FLEXERIL) 5 MG tablet 1 tab po prn tension ha 20 tablet 0  . hydrochlorothiazide (MICROZIDE) 12.5 MG capsule Take 1 capsule (12.5 mg total) by mouth daily. 90 capsule 1  . magnesium 30 MG tablet Take 30 mg by mouth 2 (two) times daily.    . SUMAtriptan (IMITREX) 50 MG tablet Take 1 tablet (50 mg total) by mouth every 2 (two) hours as needed for migraine. May repeat in 2 hours if headache persists or recurs. 16 tablet 0  . tizanidine  (ZANAFLEX) 6 MG capsule 1 tab po q hs prn  HA with trapezius myalgia 7 capsule 0  . zolpidem (AMBIEN) 10 MG tablet Take 10 mg by mouth as needed.   5  . [DISCONTINUED] pantoprazole (PROTONIX) 40 MG tablet Take 40 mg by mouth daily before breakfast.       No current facility-administered medications on file prior to visit.    BP 120/80 mmHg  Pulse 79  Temp(Src) 98.1 F (36.7 C) (Oral)  Ht 5\' 5"  (1.651 m)  Wt 184 lb 12.8 oz (83.825 kg)  BMI 30.75 kg/m2  SpO2 98%       Objective:   Physical Exam  General Appearance- Not in acute distress.  HEENT Eyes- Scleraeral/Conjuntiva-bilat- Not Yellow. Mouth & Throat- Normal.  Chest and Lung Exam Auscultation: Breath sounds:-Normal. Adventitious sounds:- No Adventitious sounds.  Cardiovascular Auscultation:Rythm - Regular. Heart Sounds -Normal heart sounds.  Abdomen Inspection:-Inspection Normal.  Palpation/Perucssion: Palpation and Percussion of the abdomen reveal- Non Tender, No Rebound tenderness, No rigidity(Guarding) and No Palpable abdominal masses.  Liver:-Normal.  Spleen:- Normal.   Neurologic- CN III- XII grossly intact.        Assessment & Plan:  For your migraine ha and possible tension ha as well continue imitrex and flexeril.   For gerd continue omeprazole. Rx written today.   Follow up as already scheduled for physical and fasting labs.

## 2016-01-28 NOTE — Progress Notes (Signed)
Pre visit review using our clinic review tool, if applicable. No additional management support is needed unless otherwise documented below in the visit note. 

## 2016-02-03 ENCOUNTER — Encounter: Payer: Self-pay | Admitting: Medical

## 2016-02-11 NOTE — Patient Instructions (Addendum)
Your procedure is scheduled on:  Monday, Feb. 27, 2017  Enter through the Main Entrance of [facility] at:  6:00 A.M.  Pick up the phone at the desk and dial [phone].  Call this number if you have problems the morning of surgery: [phone].  Remember:  Do NOT eat food or drink after:  Midnight Sunday, Feb. 26, 2017  Take these medicines the morning of surgery with a SIP OF WATER:  HCTZ  Do NOT wear jewelry (body piercing), metal hair clips/bobby pins, make-up, or nail polish. Do NOT wear lotions, powders, or perfumes.  You may wear deoderant. Do NOT shave for 48 hours prior to surgery. Do NOT bring valuables to the hospital. Contacts, dentures, or bridgework may not be worn into surgery.  Leave suitcase in car.  After surgery it may be brought to your room.  For patients admitted to the hospital, checkout time is 11:00 AM the day of discharge.

## 2016-02-12 ENCOUNTER — Encounter (HOSPITAL_COMMUNITY): Payer: Self-pay

## 2016-02-12 ENCOUNTER — Encounter (HOSPITAL_COMMUNITY)
Admission: RE | Admit: 2016-02-12 | Discharge: 2016-02-12 | Disposition: A | Discharge status: Home / Self Care | Payer: 59 | Source: Ambulatory Visit | Attending: Obstetrics and Gynecology | Admitting: Obstetrics and Gynecology

## 2016-02-12 DIAGNOSIS — Z01812 Encounter for preprocedural laboratory examination: Secondary | ICD-10-CM | POA: Insufficient documentation

## 2016-02-12 HISTORY — DX: Headache, unspecified: R51.9

## 2016-02-12 HISTORY — DX: Essential (primary) hypertension: I10

## 2016-02-12 HISTORY — DX: Major depressive disorder, single episode, unspecified: F32.9

## 2016-02-12 HISTORY — DX: Anxiety disorder, unspecified: F41.9

## 2016-02-12 HISTORY — DX: Personal history of other medical treatment: Z92.89

## 2016-02-12 HISTORY — DX: Depression, unspecified: F32.A

## 2016-02-12 HISTORY — DX: Headache: R51

## 2016-02-12 LAB — CBC
HEMATOCRIT: 38.1 % (ref 36.0–46.0)
Hemoglobin: 12.9 g/dL (ref 12.0–15.0)
MCH: 30.9 pg (ref 26.0–34.0)
MCHC: 33.9 g/dL (ref 30.0–36.0)
MCV: 91.1 fL (ref 78.0–100.0)
PLATELETS: 367 10*3/uL (ref 150–400)
RBC: 4.18 MIL/uL (ref 3.87–5.11)
RDW: 13.8 % (ref 11.5–15.5)
WBC: 13 10*3/uL — ABNORMAL HIGH (ref 4.0–10.5)

## 2016-02-12 LAB — BASIC METABOLIC PANEL
ANION GAP: 9 (ref 5–15)
BUN: 8 mg/dL (ref 6–20)
CALCIUM: 9.1 mg/dL (ref 8.9–10.3)
CO2: 25 mmol/L (ref 22–32)
Chloride: 102 mmol/L (ref 101–111)
Creatinine, Ser: 0.71 mg/dL (ref 0.44–1.00)
GFR calc Af Amer: >60 mL/min (ref >60)
Glucose, Bld: 132 mg/dL — ABNORMAL HIGH (ref 65–99)
Potassium: 3.3 mmol/L — ABNORMAL LOW (ref 3.5–5.1)
Sodium: 136 mmol/L (ref 135–145)

## 2016-02-12 LAB — TYPE AND SCREEN
ABO/RH(D): A POS
ANTIBODY SCREEN: NEGATIVE

## 2016-02-22 MED ORDER — DEXTROSE 5 % IV SOLN
2.0000 g | INTRAVENOUS | Status: AC
  Administered 2016-02-23: 2 g via INTRAVENOUS
  Filled 2016-02-22: qty 2

## 2016-02-22 NOTE — H&P (Addendum)
Ann Frederick  has been having worsening problems with abnormal uterine bleeding, enlarging fibroids, pelvic pain, dysmenorrhea.  She has finally reached a point where she knows that she should not be having more children.  Does have an IUD, and again bleeds on and off again a lot.    O:  Saline infusion ultrasound was carried out without difficulty 8/16 .  It does show IUD seen in proper position.   C-section scar is visualized measuring 4.3 cm.  There are several large fibroids, one 4.6 cm, one 5.4 cm, which slightly changes the uterine cavity, but it is not submucosal.  Saline infusion ultrasound was carried out without difficulty and did show normal-appearing endometrial lining.   Mirena IUD which was confirmed in proper placement by ultrasound but most recently she has been having to be on estradiol 2 mg daily just to control the bleeding and does get some irregular bleeding.  She is scheduled for a hysterectomy later this month due to fibroids and abnormal bleeding.     Physical exam:  General:  Alert and oriented.  Normocephalic and atraumatic.  No thyromegaly or masses palpated.  Heart is regular rate and rhythm without murmur.  Lungs are clear to auscultation bilaterally.  Breasts without masses, lymphadenopathy or discharge.  Abdomen is soft, nontender, nondistended.  No rebound or guarding.  No flank pain is noted.  Extremities without clubbing, cyanosis or edema.  Normal external genitalia, Bartholin, Skene's and urethra.  Vagina without lesions.  Cervix within normal limits.  Uterus is approximately 10 weeks' size, irregular.  No adnexal masses are palpable.  No inguinal lymphadenopathy palpable.      A/P:  Enlarging fibroids.  Fibroids have nearly doubled in size since we last evaluated them.  Abnormal uterine bleeding with pelvic pressure, pain, and dysmenorrhea .Discussed the hysterectomy for abnormal bleeding, menorrhagia and fibroids.  Previous myomectomies and C-section.  Discussed the surgery  at length, its pros, its cons, risks and benefits, recovery at length.  Plan to proceed laparoscopically but probably more likely diagnostic laparoscopy with TAH versus LAVH with bilateral salpingectomy. .  At this time, she has no further childbearing desires.  We had previously discussed myomectomy.  At this time, she is leaning towards hysterectomy with preservation of the ovaries.  Would recommend laparoscopically-assisted vaginal hysterectomy with bilateral salpingectomy with preservation of the ovaries with the possibility of having to convert this to abdominal hysterectomy.  She does want to proceed with this.  Louretta Shorten, MD  02/23/16 0715 This patient has been seen and examined.   All of her questions were answered.  Labs and vital signs reviewed.  Informed consent has been obtained.  The History and Physical is current. DL

## 2016-02-23 ENCOUNTER — Ambulatory Visit (HOSPITAL_COMMUNITY): Payer: 59 | Admitting: Certified Registered Nurse Anesthetist

## 2016-02-23 ENCOUNTER — Encounter (HOSPITAL_COMMUNITY): Payer: Self-pay

## 2016-02-23 ENCOUNTER — Observation Stay (HOSPITAL_COMMUNITY)
Admission: RE | Admit: 2016-02-23 | Discharge: 2016-02-24 | Disposition: A | Discharge status: Home / Self Care | Payer: 59 | Source: Ambulatory Visit | Attending: Obstetrics and Gynecology | Admitting: Obstetrics and Gynecology

## 2016-02-23 ENCOUNTER — Encounter (HOSPITAL_COMMUNITY)
Admission: RE | Disposition: A | Discharge status: Home / Self Care | Payer: Self-pay | Source: Ambulatory Visit | Attending: Obstetrics and Gynecology

## 2016-02-23 DIAGNOSIS — E119 Type 2 diabetes mellitus without complications: Secondary | ICD-10-CM | POA: Insufficient documentation

## 2016-02-23 DIAGNOSIS — Z79899 Other long term (current) drug therapy: Secondary | ICD-10-CM | POA: Diagnosis not present

## 2016-02-23 DIAGNOSIS — N938 Other specified abnormal uterine and vaginal bleeding: Secondary | ICD-10-CM | POA: Diagnosis present

## 2016-02-23 DIAGNOSIS — Z90710 Acquired absence of both cervix and uterus: Secondary | ICD-10-CM | POA: Diagnosis present

## 2016-02-23 DIAGNOSIS — D259 Leiomyoma of uterus, unspecified: Secondary | ICD-10-CM | POA: Diagnosis not present

## 2016-02-23 DIAGNOSIS — F329 Major depressive disorder, single episode, unspecified: Secondary | ICD-10-CM | POA: Insufficient documentation

## 2016-02-23 DIAGNOSIS — Z7982 Long term (current) use of aspirin: Secondary | ICD-10-CM | POA: Insufficient documentation

## 2016-02-23 DIAGNOSIS — Z6833 Body mass index (BMI) 33.0-33.9, adult: Secondary | ICD-10-CM | POA: Insufficient documentation

## 2016-02-23 DIAGNOSIS — N800 Endometriosis of uterus: Secondary | ICD-10-CM | POA: Insufficient documentation

## 2016-02-23 DIAGNOSIS — K219 Gastro-esophageal reflux disease without esophagitis: Secondary | ICD-10-CM | POA: Diagnosis not present

## 2016-02-23 DIAGNOSIS — N838 Other noninflammatory disorders of ovary, fallopian tube and broad ligament: Secondary | ICD-10-CM | POA: Insufficient documentation

## 2016-02-23 DIAGNOSIS — F419 Anxiety disorder, unspecified: Secondary | ICD-10-CM | POA: Diagnosis not present

## 2016-02-23 DIAGNOSIS — E669 Obesity, unspecified: Secondary | ICD-10-CM | POA: Insufficient documentation

## 2016-02-23 DIAGNOSIS — I10 Essential (primary) hypertension: Secondary | ICD-10-CM | POA: Diagnosis not present

## 2016-02-23 DIAGNOSIS — N8301 Follicular cyst of right ovary: Secondary | ICD-10-CM | POA: Diagnosis not present

## 2016-02-23 HISTORY — PX: LAPAROSCOPIC VAGINAL HYSTERECTOMY WITH SALPINGECTOMY: SHX6680

## 2016-02-23 LAB — GLUCOSE, CAPILLARY: GLUCOSE-CAPILLARY: 133 mg/dL — AB (ref 65–99)

## 2016-02-23 SURGERY — HYSTERECTOMY, VAGINAL, LAPAROSCOPY-ASSISTED, WITH SALPINGECTOMY
Anesthesia: General | Laterality: Bilateral

## 2016-02-23 MED ORDER — PROPOFOL 10 MG/ML IV BOLUS
INTRAVENOUS | Status: AC
  Filled 2016-02-23: qty 20

## 2016-02-23 MED ORDER — DOCUSATE SODIUM 100 MG PO CAPS
100.0000 mg | ORAL_CAPSULE | Freq: Two times a day (BID) | ORAL | Status: DC
  Administered 2016-02-23 – 2016-02-24 (×2): 100 mg via ORAL
  Filled 2016-02-23 (×2): qty 1

## 2016-02-23 MED ORDER — MEPERIDINE HCL 25 MG/ML IJ SOLN: 6.2500 mg | INTRAMUSCULAR | Status: DC | PRN

## 2016-02-23 MED ORDER — OXYCODONE-ACETAMINOPHEN 5-325 MG PO TABS
1.0000 | ORAL_TABLET | ORAL | Status: DC | PRN
  Administered 2016-02-24 (×2): 2 via ORAL
  Filled 2016-02-23 (×2): qty 2

## 2016-02-23 MED ORDER — LIDOCAINE HCL (CARDIAC) 20 MG/ML IV SOLN
INTRAVENOUS | Status: AC
  Filled 2016-02-23: qty 5

## 2016-02-23 MED ORDER — STERILE WATER FOR IRRIGATION IR SOLN
Status: DC | PRN
  Administered 2016-02-23: 1000 mL

## 2016-02-23 MED ORDER — LACTATED RINGERS IR SOLN
Status: DC | PRN
Start: 2016-02-23 — End: 2016-02-23
  Administered 2016-02-23: 3000 mL

## 2016-02-23 MED ORDER — SUGAMMADEX SODIUM 200 MG/2ML IV SOLN
INTRAVENOUS | Status: AC
  Filled 2016-02-23: qty 2

## 2016-02-23 MED ORDER — DEXAMETHASONE SODIUM PHOSPHATE 10 MG/ML IJ SOLN
INTRAMUSCULAR | Status: DC | PRN
  Administered 2016-02-23: 4 mg via INTRAVENOUS

## 2016-02-23 MED ORDER — SCOPOLAMINE 1 MG/3DAYS TD PT72
1.0000 | MEDICATED_PATCH | Freq: Once | TRANSDERMAL | Status: DC
  Administered 2016-02-23: 1.5 mg via TRANSDERMAL

## 2016-02-23 MED ORDER — MENTHOL 3 MG MT LOZG: 1.0000 | LOZENGE | OROMUCOSAL | Status: DC | PRN

## 2016-02-23 MED ORDER — KETOROLAC TROMETHAMINE 30 MG/ML IJ SOLN
INTRAMUSCULAR | Status: AC
  Filled 2016-02-23: qty 1

## 2016-02-23 MED ORDER — LABETALOL HCL 5 MG/ML IV SOLN
INTRAVENOUS | Status: DC | PRN
  Administered 2016-02-23: 5 mg via INTRAVENOUS

## 2016-02-23 MED ORDER — ONDANSETRON HCL 4 MG/2ML IJ SOLN
INTRAMUSCULAR | Status: DC | PRN
  Administered 2016-02-23: 4 mg via INTRAVENOUS

## 2016-02-23 MED ORDER — METHYLENE BLUE 1 % INJ SOLN
INTRAMUSCULAR | Status: AC
  Filled 2016-02-23: qty 10

## 2016-02-23 MED ORDER — FENTANYL CITRATE (PF) 100 MCG/2ML IJ SOLN
INTRAMUSCULAR | Status: DC | PRN
  Administered 2016-02-23: 50 ug via INTRAVENOUS
  Administered 2016-02-23: 250 ug via INTRAVENOUS
  Administered 2016-02-23: 50 ug via INTRAVENOUS
  Administered 2016-02-23: 100 ug via INTRAVENOUS
  Administered 2016-02-23: 50 ug via INTRAVENOUS

## 2016-02-23 MED ORDER — NEOSTIGMINE METHYLSULFATE 10 MG/10ML IV SOLN
INTRAVENOUS | Status: AC
  Filled 2016-02-23: qty 1

## 2016-02-23 MED ORDER — FENTANYL CITRATE (PF) 250 MCG/5ML IJ SOLN
INTRAMUSCULAR | Status: AC
  Filled 2016-02-23: qty 5

## 2016-02-23 MED ORDER — EPHEDRINE SULFATE 50 MG/ML IJ SOLN
INTRAMUSCULAR | Status: DC | PRN
  Administered 2016-02-23: 5 mg via INTRAVENOUS

## 2016-02-23 MED ORDER — ONDANSETRON HCL 4 MG/2ML IJ SOLN
INTRAMUSCULAR | Status: AC
  Filled 2016-02-23: qty 2

## 2016-02-23 MED ORDER — NALOXONE HCL 0.4 MG/ML IJ SOLN: 0.4000 mg | INTRAMUSCULAR | Status: DC | PRN

## 2016-02-23 MED ORDER — MIDAZOLAM HCL 2 MG/2ML IJ SOLN
INTRAMUSCULAR | Status: AC
  Filled 2016-02-23: qty 2

## 2016-02-23 MED ORDER — BUPIVACAINE HCL (PF) 0.25 % IJ SOLN
INTRAMUSCULAR | Status: AC
  Filled 2016-02-23: qty 30

## 2016-02-23 MED ORDER — METHYLENE BLUE 1 % INJ SOLN
INTRAMUSCULAR | Status: AC
  Filled 2016-02-23: qty 1

## 2016-02-23 MED ORDER — METOCLOPRAMIDE HCL 5 MG/ML IJ SOLN
INTRAMUSCULAR | Status: AC
  Filled 2016-02-23: qty 2

## 2016-02-23 MED ORDER — FLUOXETINE HCL 20 MG PO CAPS
40.0000 mg | ORAL_CAPSULE | Freq: Every day | ORAL | Status: DC
  Administered 2016-02-23: 40 mg via ORAL
  Filled 2016-02-23 (×2): qty 2

## 2016-02-23 MED ORDER — LACTATED RINGERS IV SOLN
INTRAVENOUS | Status: DC
  Administered 2016-02-23: 09:00:00 via INTRAVENOUS
  Administered 2016-02-23: 125 mL/h via INTRAVENOUS
  Administered 2016-02-23 (×2): via INTRAVENOUS

## 2016-02-23 MED ORDER — BUPIVACAINE HCL (PF) 0.25 % IJ SOLN
INTRAMUSCULAR | Status: DC | PRN
  Administered 2016-02-23: 10 mL

## 2016-02-23 MED ORDER — ONDANSETRON HCL 4 MG/2ML IJ SOLN
4.0000 mg | Freq: Four times a day (QID) | INTRAMUSCULAR | Status: DC | PRN
  Filled 2016-02-23: qty 2

## 2016-02-23 MED ORDER — EPHEDRINE 5 MG/ML INJ
INTRAVENOUS | Status: AC
  Filled 2016-02-23: qty 10

## 2016-02-23 MED ORDER — ZOLPIDEM TARTRATE 5 MG PO TABS
5.0000 mg | ORAL_TABLET | Freq: Every evening | ORAL | Status: DC | PRN
  Administered 2016-02-23: 5 mg via ORAL
  Filled 2016-02-23: qty 1

## 2016-02-23 MED ORDER — SODIUM CHLORIDE 0.9% FLUSH: 9.0000 mL | INTRAVENOUS | Status: DC | PRN

## 2016-02-23 MED ORDER — GLYCOPYRROLATE 0.2 MG/ML IJ SOLN
INTRAMUSCULAR | Status: AC
  Filled 2016-02-23: qty 3

## 2016-02-23 MED ORDER — PROPOFOL 10 MG/ML IV BOLUS
INTRAVENOUS | Status: DC | PRN
  Administered 2016-02-23: 200 mg via INTRAVENOUS
  Administered 2016-02-23: 10 mg via INTRAVENOUS

## 2016-02-23 MED ORDER — HYDROMORPHONE HCL 1 MG/ML IJ SOLN
0.2000 mg | INTRAMUSCULAR | Status: DC | PRN
  Administered 2016-02-24: 0.2 mg via INTRAVENOUS
  Filled 2016-02-23: qty 1

## 2016-02-23 MED ORDER — LABETALOL HCL 5 MG/ML IV SOLN
INTRAVENOUS | Status: AC
  Filled 2016-02-23: qty 4

## 2016-02-23 MED ORDER — ROCURONIUM BROMIDE 100 MG/10ML IV SOLN
INTRAVENOUS | Status: DC | PRN
  Administered 2016-02-23: 40 mg via INTRAVENOUS
  Administered 2016-02-23: 10 mg via INTRAVENOUS

## 2016-02-23 MED ORDER — DEXAMETHASONE SODIUM PHOSPHATE 4 MG/ML IJ SOLN
INTRAMUSCULAR | Status: AC
  Filled 2016-02-23: qty 1

## 2016-02-23 MED ORDER — IBUPROFEN 600 MG PO TABS: 600.0000 mg | ORAL_TABLET | Freq: Four times a day (QID) | ORAL | Status: DC | PRN

## 2016-02-23 MED ORDER — DEXTROSE 5 % IV SOLN
1.0000 g | Freq: Two times a day (BID) | INTRAVENOUS | Status: DC
  Administered 2016-02-23 – 2016-02-24 (×2): 1 g via INTRAVENOUS
  Filled 2016-02-23 (×3): qty 1

## 2016-02-23 MED ORDER — HYDROMORPHONE HCL 1 MG/ML IJ SOLN
INTRAMUSCULAR | Status: AC
  Filled 2016-02-23: qty 1

## 2016-02-23 MED ORDER — DIPHENHYDRAMINE HCL 12.5 MG/5ML PO ELIX: 12.5000 mg | ORAL_SOLUTION | Freq: Four times a day (QID) | ORAL | Status: DC | PRN

## 2016-02-23 MED ORDER — FENTANYL CITRATE (PF) 100 MCG/2ML IJ SOLN
INTRAMUSCULAR | Status: AC
  Administered 2016-02-23: 50 ug via INTRAVENOUS
  Filled 2016-02-23: qty 2

## 2016-02-23 MED ORDER — ZOLPIDEM TARTRATE 5 MG PO TABS
10.0000 mg | ORAL_TABLET | Freq: Every evening | ORAL | Status: DC | PRN
Start: 2016-02-23 — End: 2016-02-23

## 2016-02-23 MED ORDER — METOCLOPRAMIDE HCL 5 MG/ML IJ SOLN
10.0000 mg | Freq: Once | INTRAMUSCULAR | Status: AC | PRN
  Administered 2016-02-23: 10 mg via INTRAVENOUS

## 2016-02-23 MED ORDER — DEXTROSE-NACL 5-0.45 % IV SOLN
INTRAVENOUS | Status: DC
  Administered 2016-02-23: 15:00:00 via INTRAVENOUS

## 2016-02-23 MED ORDER — METHYLENE BLUE 0.5 % INJ SOLN
INTRAVENOUS | Status: DC | PRN
  Administered 2016-02-23: 5 mL via INTRAVENOUS

## 2016-02-23 MED ORDER — HYDROMORPHONE HCL 1 MG/ML IJ SOLN
1.0000 mg | Freq: Once | INTRAMUSCULAR | Status: AC
  Administered 2016-02-23: 1 mg via INTRAVENOUS

## 2016-02-23 MED ORDER — SCOPOLAMINE 1 MG/3DAYS TD PT72
MEDICATED_PATCH | TRANSDERMAL | Status: AC
  Administered 2016-02-23: 1.5 mg via TRANSDERMAL
  Filled 2016-02-23: qty 1

## 2016-02-23 MED ORDER — HYDROCHLOROTHIAZIDE 12.5 MG PO CAPS
12.5000 mg | ORAL_CAPSULE | Freq: Every day | ORAL | Status: DC
  Administered 2016-02-24: 12.5 mg via ORAL
  Filled 2016-02-23 (×2): qty 1

## 2016-02-23 MED ORDER — FENTANYL CITRATE (PF) 100 MCG/2ML IJ SOLN
25.0000 ug | INTRAMUSCULAR | Status: DC | PRN
  Administered 2016-02-23 (×3): 50 ug via INTRAVENOUS

## 2016-02-23 MED ORDER — DIPHENHYDRAMINE HCL 50 MG/ML IJ SOLN
12.5000 mg | Freq: Four times a day (QID) | INTRAMUSCULAR | Status: DC | PRN
  Administered 2016-02-23: 12.5 mg via INTRAVENOUS
  Filled 2016-02-23: qty 1

## 2016-02-23 MED ORDER — LIDOCAINE HCL (CARDIAC) 20 MG/ML IV SOLN
INTRAVENOUS | Status: DC | PRN
  Administered 2016-02-23: 30 mg via INTRAVENOUS

## 2016-02-23 MED ORDER — ROCURONIUM BROMIDE 100 MG/10ML IV SOLN
INTRAVENOUS | Status: AC
  Filled 2016-02-23: qty 1

## 2016-02-23 MED ORDER — SUGAMMADEX SODIUM 200 MG/2ML IV SOLN
INTRAVENOUS | Status: DC | PRN
  Administered 2016-02-23: 168.2 mg via INTRAVENOUS

## 2016-02-23 MED ORDER — FENTANYL CITRATE (PF) 100 MCG/2ML IJ SOLN
INTRAMUSCULAR | Status: AC
  Filled 2016-02-23: qty 2

## 2016-02-23 MED ORDER — HYDROMORPHONE 1 MG/ML IV SOLN
INTRAVENOUS | Status: DC
  Administered 2016-02-23: 5.6 mL via INTRAVENOUS
  Administered 2016-02-23: 13:00:00 via INTRAVENOUS
  Administered 2016-02-23: 6 mg via INTRAVENOUS
  Administered 2016-02-24: 1.8 mg via INTRAVENOUS
  Administered 2016-02-24: 2.8 mg via INTRAVENOUS
  Filled 2016-02-23: qty 25

## 2016-02-23 MED ORDER — ALPRAZOLAM 0.5 MG PO TABS: 0.5000 mg | ORAL_TABLET | Freq: Every evening | ORAL | Status: DC | PRN

## 2016-02-23 MED ORDER — MIDAZOLAM HCL 2 MG/2ML IJ SOLN
INTRAMUSCULAR | Status: DC | PRN
  Administered 2016-02-23: 2 mg via INTRAVENOUS

## 2016-02-23 SURGICAL SUPPLY — 50 items
BLADE SURG 15 STRL LF C SS BP (BLADE) ×1 IMPLANT
BLADE SURG 15 STRL SS (BLADE) ×2
CABLE HIGH FREQUENCY MONO STRZ (ELECTRODE) IMPLANT
CATH ROBINSON RED A/P 16FR (CATHETERS) ×2 IMPLANT
CLOTH BEACON ORANGE TIMEOUT ST (SAFETY) ×2 IMPLANT
CONT PATH 16OZ SNAP LID 3702 (MISCELLANEOUS) ×2 IMPLANT
COVER BACK TABLE 60X90IN (DRAPES) ×2 IMPLANT
DECANTER SPIKE VIAL GLASS SM (MISCELLANEOUS) ×1 IMPLANT
DRSG COVADERM PLUS 2X2 (GAUZE/BANDAGES/DRESSINGS) ×4 IMPLANT
DRSG OPSITE POSTOP 3X4 (GAUZE/BANDAGES/DRESSINGS) ×1 IMPLANT
DURAPREP 26ML APPLICATOR (WOUND CARE) ×2 IMPLANT
ELECT LIGASURE LONG (ELECTRODE) ×2 IMPLANT
ELECT REM PT RETURN 9FT ADLT (ELECTROSURGICAL) ×2
ELECTRODE REM PT RTRN 9FT ADLT (ELECTROSURGICAL) IMPLANT
FORCEPS CUTTING 45CM 5MM (CUTTING FORCEPS) ×2 IMPLANT
GLOVE BIO SURGEON STRL SZ8 (GLOVE) ×2 IMPLANT
GLOVE BIOGEL PI IND STRL 6.5 (GLOVE) ×1 IMPLANT
GLOVE BIOGEL PI IND STRL 7.0 (GLOVE) ×3 IMPLANT
GLOVE BIOGEL PI INDICATOR 6.5 (GLOVE) ×1
GLOVE BIOGEL PI INDICATOR 7.0 (GLOVE) ×3
GLOVE SURG ORTHO 8.0 STRL STRW (GLOVE) ×6 IMPLANT
LEGGING LITHOTOMY PAIR STRL (DRAPES) ×2 IMPLANT
LIGASURE LAP L-HOOKWIRE 5 44CM (INSTRUMENTS) IMPLANT
LIQUID BAND (GAUZE/BANDAGES/DRESSINGS) ×2 IMPLANT
NEEDLE INSUFFLATION 120MM (ENDOMECHANICALS) ×2 IMPLANT
NS IRRIG 1000ML POUR BTL (IV SOLUTION) ×2 IMPLANT
PACK LAVH (CUSTOM PROCEDURE TRAY) ×2 IMPLANT
PACK ROBOTIC GOWN (GOWN DISPOSABLE) ×2 IMPLANT
PAD TRENDELENBURG POSITION (MISCELLANEOUS) ×2 IMPLANT
SCISSORS LAP 5X45 EPIX DISP (ENDOMECHANICALS) ×1 IMPLANT
SET CYSTO W/LG BORE CLAMP LF (SET/KITS/TRAYS/PACK) ×1 IMPLANT
SET IRRIG TUBING LAPAROSCOPIC (IRRIGATION / IRRIGATOR) ×1 IMPLANT
SLEEVE XCEL OPT CAN 5 100 (ENDOMECHANICALS) ×2 IMPLANT
SOLUTION ELECTROLUBE (MISCELLANEOUS) IMPLANT
STRIP CLOSURE SKIN 1/4X3 (GAUZE/BANDAGES/DRESSINGS) IMPLANT
SUT MNCRL 0 MO-4 VIOLET 18 CR (SUTURE) ×2 IMPLANT
SUT MNCRL 0 VIOLET 6X18 (SUTURE) ×1 IMPLANT
SUT MNCRL AB 0 CT1 27 (SUTURE) IMPLANT
SUT MON AB 2-0 CT1 36 (SUTURE) IMPLANT
SUT MONOCRYL 0 6X18 (SUTURE) ×1
SUT MONOCRYL 0 MO 4 18  CR/8 (SUTURE) ×2
SUT VICRYL 0 UR6 27IN ABS (SUTURE) ×2 IMPLANT
SUT VICRYL RAPIDE 3 0 (SUTURE) ×2 IMPLANT
SUT VLOC 180 2-0 6IN GS21 (SUTURE) ×2 IMPLANT
TOWEL OR 17X24 6PK STRL BLUE (TOWEL DISPOSABLE) ×4 IMPLANT
TRAY FOLEY CATH SILVER 14FR (SET/KITS/TRAYS/PACK) ×2 IMPLANT
TROCAR OPTI TIP 5M 100M (ENDOMECHANICALS) ×2 IMPLANT
TROCAR XCEL DIL TIP R 11M (ENDOMECHANICALS) ×2 IMPLANT
WARMER LAPAROSCOPE (MISCELLANEOUS) ×2 IMPLANT
WATER STERILE IRR 1000ML POUR (IV SOLUTION) ×1 IMPLANT

## 2016-02-23 NOTE — OR Nursing (Signed)
Called Dr. Shirlean Mylar, at [facility] urology per Dr. Corinna Capra. Dr. Shirlean Mylar confirmed that Dr. Corinna Capra should use a double layer of VLOC stitches laparoscopically, then fill the bladder with 36mls sterile water mixed with methylene blue to check for leakage.

## 2016-02-23 NOTE — Anesthesia Preprocedure Evaluation (Addendum)
Anesthesia Evaluation  Patient identified by MRN, date of birth, ID band Patient awake    Reviewed: Allergy & Precautions, NPO status , Patient's Chart, lab work & pertinent test results  History of Anesthesia Complications Negative for: history of anesthetic complications  Airway Mallampati: II  TM Distance: >3 FB Neck ROM: Full    Dental no notable dental hx. (+) Teeth Intact, Dental Advisory Given   Pulmonary neg pulmonary ROS,    Pulmonary exam normal breath sounds clear to auscultation       Cardiovascular hypertension, Pt. on medications (-) anginaNormal cardiovascular exam+ Valvular Problems/Murmurs  Rhythm:Regular Rate:Normal     Neuro/Psych  Headaches, PSYCHIATRIC DISORDERS Anxiety Depression Chronic back pain  Neuromuscular disease negative neurological ROS     GI/Hepatic Neg liver ROS, GERD  Medicated and Controlled,  Endo/Other  diabetes (glu 132), GestationalMorbid obesityObesity  Renal/GU negative Renal ROS  negative genitourinary   Musculoskeletal negative musculoskeletal ROS (+)   Abdominal (+) + obese,   Peds  Hematology  (+) anemia ,   Anesthesia Other Findings   Reproductive/Obstetrics Uterine fibroids Menorrhagia Dysmenorrhea                         Anesthesia Physical Anesthesia Plan  ASA: II  Anesthesia Plan: General   Post-op Pain Management:    Induction: Intravenous  Airway Management Planned: Oral ETT  Additional Equipment:   Intra-op Plan:   Post-operative Plan: Extubation in OR  Informed Consent: I have reviewed the patients History and Physical, chart, labs and discussed the procedure including the risks, benefits and alternatives for the proposed anesthesia with the patient or authorized representative who has indicated his/her understanding and acceptance.   Dental advisory given  Plan Discussed with: CRNA, Anesthesiologist and  Surgeon  Anesthesia Plan Comments:         Anesthesia Quick Evaluation

## 2016-02-23 NOTE — Addendum Note (Signed)
Addendum  created 02/23/16 1531 by Ignacia Bayley, CRNA   Modules edited: Clinical Notes   Clinical Notes:  File: [id number]

## 2016-02-23 NOTE — Anesthesia Postprocedure Evaluation (Signed)
Anesthesia Post Note  Patient: Ann Frederick  Procedure(s) Performed: Procedure(s) (LRB): LAPAROSCOPIC ASSISTED VAGINAL HYSTERECTOMY WITH bilateral SALPINGECTOMY, right oophorectomy. laporotic repair of incidental cystotomy. (Bilateral)  Patient location during evaluation: [facility] Anesthesia Type: General Level of consciousness: awake Pain management: pain level controlled Vital Signs Assessment: post-procedure vital signs reviewed and stable Respiratory status: spontaneous breathing Cardiovascular status: stable Postop Assessment: no signs of nausea or vomiting and adequate PO intake Anesthetic complications: no    Last Vitals:  Filed Vitals:   02/23/16 1328 02/23/16 1430  BP: 113/69 132/78  Pulse: 103 103  Temp: 36.4 C 36.9 C  Resp: 14 17    Last Pain:  Filed Vitals:   02/23/16 1440  PainSc: 3                  Lashun Ramseyer

## 2016-02-23 NOTE — Transfer of Care (Signed)
Immediate Anesthesia Transfer of Care Note  Patient: Ann Frederick  Procedure(s) Performed: Procedure(s): LAPAROSCOPIC ASSISTED VAGINAL HYSTERECTOMY WITH bilateral SALPINGECTOMY, right oophorectomy. laporotic repair of incidental cystotomy. (Bilateral)  Patient Location: PACU  Anesthesia Type:General  Level of Consciousness: awake, alert  and sedated  Airway & Oxygen Therapy: Patient Spontanous Breathing and Patient connected to nasal cannula oxygen  Post-op Assessment: Report given to RN, Post -op Vital signs reviewed and stable and Patient moving all extremities  Post vital signs: Reviewed and stable  Last Vitals:  Filed Vitals:   02/23/16 0624  BP: 137/89  Pulse: 92  Temp: 36.5 C  Resp: 16    Complications: No apparent anesthesia complications

## 2016-02-23 NOTE — OR Nursing (Signed)
Dr.Lowe called back to bedside. Pt abdomen noted to be distended and air noted in foley bag. Dr.Lowe assessed pt in OR and stated that pt was ok to be moved.

## 2016-02-23 NOTE — Anesthesia Procedure Notes (Signed)
Procedure Name: Intubation Date/Time: 02/23/2016 7:33 AM Performed by: Hewitt Blade Pre-anesthesia Checklist: Patient identified, Emergency Drugs available, Suction available and Patient being monitored Patient Re-evaluated:Patient Re-evaluated prior to inductionOxygen Delivery Method: Circle system utilized Preoxygenation: Pre-oxygenation with 100% oxygen Intubation Type: IV induction Ventilation: Mask ventilation without difficulty Laryngoscope Size: Mac and 3 Grade View: Grade I Tube type: Oral Tube size: 7.0 mm Number of attempts: 1 Airway Equipment and Method: Stylet Placement Confirmation: ETT inserted through vocal cords under direct vision,  positive ETCO2 and breath sounds checked- equal and bilateral Secured at: 20 cm Tube secured with: Tape Dental Injury: Teeth and Oropharynx as per pre-operative assessment

## 2016-02-23 NOTE — Anesthesia Postprocedure Evaluation (Signed)
Anesthesia Post Note  Patient: Ann Frederick  Procedure(s) Performed: Procedure(s) (LRB): LAPAROSCOPIC ASSISTED VAGINAL HYSTERECTOMY WITH bilateral SALPINGECTOMY, right oophorectomy. laporotic repair of incidental cystotomy. (Bilateral)  Patient location during evaluation: PACU Anesthesia Type: General Level of consciousness: awake and alert, oriented and patient cooperative Pain management: pain level controlled Vital Signs Assessment: post-procedure vital signs reviewed and stable Respiratory status: spontaneous breathing, nonlabored ventilation, respiratory function stable and patient connected to nasal cannula oxygen Cardiovascular status: blood pressure returned to baseline and stable Postop Assessment: no signs of nausea or vomiting Anesthetic complications: no    Last Vitals:  Filed Vitals:   02/23/16 1115 02/23/16 1130  BP: 131/59 150/90  Pulse: 97 101  Temp:    Resp: 15 15    Last Pain:  Filed Vitals:   02/23/16 1140  PainSc: 5                  Zahli Vetsch,E. Volney Reierson

## 2016-02-23 NOTE — Brief Op Note (Signed)
02/23/2016  10:12 AM  PATIENT:  Ann Frederick  45 y.o. female  PRE-OPERATIVE DIAGNOSIS:  pelvic pain, AUB, menorrhagia, dysmenorrhea, enlarging fibroids  POST-OPERATIVE DIAGNOSIS:  pelvic pain, AUB, menorrhagia, dysmenorrhea, enlarging fibroids  PROCEDURE:  Procedure(s): LAPAROSCOPIC ASSISTED VAGINAL HYSTERECTOMY WITH bilateral SALPINGECTOMY, right oophorectomy. laporotic repair of incidental cystotomy. (Bilateral)  SURGEON:  Surgeon(s) and Role:    * Louretta Shorten, MD - Primary    * Linda Hedges, DO - [place], DO - Assisting  PHYSICIAN ASSISTANT:   ASSISTANTS: Morris (and intraop consult with Dr Audley Hose - urology)   ANESTHESIA:   general  EBL:  Total I/O In: 2000 [I.V.:2000] Out: 500 [Blood:500]  BLOOD ADMINISTERED:none  DRAINS: Urinary Catheter (Foley)   LOCAL MEDICATIONS USED:  MARCAINE     SPECIMEN:  Source of Specimen:  uterus, right tube and ovary, left tube  DISPOSITION OF SPECIMEN:  PATHOLOGY  COUNTS:  YES  TOURNIQUET:  * No tourniquets in log *  DICTATION: .Dragon Dictation  PLAN OF CARE: Admit for overnight observation  PATIENT DISPOSITION:  PACU - hemodynamically stable.   Delay start of Pharmacological VTE agent (>24hrs) due to surgical blood loss or risk of bleeding: no

## 2016-02-24 ENCOUNTER — Encounter (HOSPITAL_COMMUNITY): Payer: Self-pay | Admitting: Obstetrics and Gynecology

## 2016-02-24 DIAGNOSIS — D259 Leiomyoma of uterus, unspecified: Secondary | ICD-10-CM | POA: Diagnosis not present

## 2016-02-24 LAB — CBC
HCT: 31.7 % — ABNORMAL LOW (ref 36.0–46.0)
Hemoglobin: 10.2 g/dL — ABNORMAL LOW (ref 12.0–15.0)
MCH: 30.2 pg (ref 26.0–34.0)
MCHC: 32.2 g/dL (ref 30.0–36.0)
MCV: 93.8 fL (ref 78.0–100.0)
PLATELETS: 279 10*3/uL (ref 150–400)
RBC: 3.38 MIL/uL — AB (ref 3.87–5.11)
RDW: 13.9 % (ref 11.5–15.5)
WBC: 14.3 10*3/uL — ABNORMAL HIGH (ref 4.0–10.5)

## 2016-02-24 LAB — BASIC METABOLIC PANEL
Anion gap: 4 — ABNORMAL LOW (ref 5–15)
BUN: <5 mg/dL — ABNORMAL LOW (ref 6–20)
CO2: 34 mmol/L — ABNORMAL HIGH (ref 22–32)
Calcium: 8.7 mg/dL — ABNORMAL LOW (ref 8.9–10.3)
Chloride: 103 mmol/L (ref 101–111)
Creatinine, Ser: 0.62 mg/dL (ref 0.44–1.00)
GFR calc Af Amer: >60 mL/min (ref >60)
GFR calc non Af Amer: >60 mL/min (ref >60)
Glucose, Bld: 125 mg/dL — ABNORMAL HIGH (ref 65–99)
Potassium: 4.1 mmol/L (ref 3.5–5.1)
Sodium: 141 mmol/L (ref 135–145)

## 2016-02-24 MED ORDER — PROMETHAZINE HCL 25 MG PO TABS: 25.0000 mg | ORAL_TABLET | Freq: Four times a day (QID) | ORAL | Status: DC | PRN

## 2016-02-24 MED ORDER — BISACODYL 10 MG RE SUPP
10.0000 mg | Freq: Every day | RECTAL | Status: DC | PRN
  Administered 2016-02-24: 10 mg via RECTAL
  Filled 2016-02-24: qty 1

## 2016-02-24 MED ORDER — IBUPROFEN 600 MG PO TABS: 600.0000 mg | ORAL_TABLET | Freq: Four times a day (QID) | ORAL | Status: DC | PRN

## 2016-02-24 MED ORDER — OXYCODONE-ACETAMINOPHEN 5-325 MG PO TABS: 1.0000 | ORAL_TABLET | ORAL | Status: DC | PRN

## 2016-02-24 MED ORDER — NITROFURANTOIN MACROCRYSTAL 100 MG PO CAPS: 100.0000 mg | ORAL_CAPSULE | Freq: Every day | ORAL | Status: DC

## 2016-02-24 MED ORDER — PROMETHAZINE HCL 25 MG PO TABS
12.5000 mg | ORAL_TABLET | Freq: Four times a day (QID) | ORAL | Status: DC | PRN
Start: 2016-02-24 — End: 2016-02-24
  Administered 2016-02-24: 12.5 mg via ORAL
  Filled 2016-02-24 (×2): qty 1

## 2016-02-24 MED ORDER — PHENAZOPYRIDINE HCL 100 MG PO TABS: 100.0000 mg | ORAL_TABLET | Freq: Three times a day (TID) | ORAL | Status: DC | PRN

## 2016-02-24 NOTE — Discharge Summary (Signed)
Physician Discharge Summary  Patient ID: Ann Frederick MRN: [id number] DOB/AGE: 04/20/1971 45 y.o.  Admit date: 02/23/2016 Discharge date: 02/24/2016  Admission Diagnoses:  Discharge Diagnoses:  Active Problems:   S/P laparoscopic assisted vaginal hysterectomy (LAVH)   Discharged Condition: good  Hospital Course: Pt underwent a LAVH, bil Salpingectomy and right oopherectomy.  The surgery was complicated by incidental cystomy repaired laproscopically.  Her post op course was uncomplicated except slow return of bowel function.  Post op D 1 tolerating regular diet with some flatus and pain well managed with oral pain meds.  She desired d/c home and was sent home with foley cath for 10 days   Consults: intraop urology  Significant Diagnostic Studies: labs: post op hgb 10.2  Treatments: surgery: as above  Discharge Exam: Blood pressure 122/67, pulse 98, temperature 98.6 F (37 C), temperature source Oral, resp. rate 18, height 5\' 2"  (1.575 m), weight 185 lb 8 oz (84.142 kg), SpO2 98 %. General appearance: alert, cooperative, appears stated age and no distress GI: soft, non-tender; bowel sounds normal; no masses,  no organomegaly Incision/Wound:CD&I  Disposition: 01-Home or Self Care  Discharge Instructions    Call MD for:  difficulty breathing, headache or visual disturbances    Complete by:  As directed      Call MD for:  persistant nausea and vomiting    Complete by:  As directed      Call MD for:  redness, tenderness, or signs of infection (pain, swelling, redness, odor or green/yellow discharge around incision site)    Complete by:  As directed      Call MD for:  severe uncontrolled pain    Complete by:  As directed      Call MD for:  temperature >100.4    Complete by:  As directed      Diet general    Complete by:  As directed      Discharge instructions    Complete by:  As directed   Follow up in 7-10 days     Driving Restrictions    Complete by:  As directed   No  driving for 2 weeks     Increase activity slowly    Complete by:  As directed      Lifting restrictions    Complete by:  As directed   No lifting anything greater than 10 pounds (if you have to ask, don't lift it)     Sexual Activity Restrictions    Complete by:  As directed   Nothing in the vagina for 6 weeks     Urinary leg bag    Complete by:  As directed             Medication List    STOP taking these medications        estradiol 2 MG tablet  Commonly known as:  ESTRACE      TAKE these medications        ALPRAZolam 0.5 MG tablet  Commonly known as:  XANAX  Take 0.5 mg by mouth at bedtime as needed for anxiety.     aspirin 81 MG tablet  Take 81 mg by mouth daily.     b complex vitamins capsule  Take 1 capsule by mouth daily.     clindamycin 1 % gel  Commonly known as:  CLINDAGEL  Apply 1 application topically daily. Use as directed.     cyclobenzaprine 5 MG tablet  Commonly known as:  FLEXERIL  1  tab po prn tension ha     diphenhydrAMINE 25 MG tablet  Commonly known as:  BENADRYL  Take 25 mg by mouth daily as needed for allergies.     FLUoxetine 40 MG capsule  Commonly known as:  PROZAC  Take 40 mg by mouth daily. Take one by mouth at bedtime.     hydrochlorothiazide 12.5 MG capsule  Commonly known as:  MICROZIDE  Take 1 capsule (12.5 mg total) by mouth daily.     ibuprofen 600 MG tablet  Commonly known as:  ADVIL,MOTRIN  Take 1 tablet (600 mg total) by mouth every 6 (six) hours as needed (mild pain).     nitrofurantoin 100 MG capsule  Commonly known as:  MACRODANTIN  Take 1 capsule (100 mg total) by mouth at bedtime.     omeprazole 20 MG capsule  Commonly known as:  PRILOSEC  Take 1 capsule (20 mg total) by mouth daily.     oxyCODONE-acetaminophen 5-325 MG tablet  Commonly known as:  PERCOCET/ROXICET  Take 1-2 tablets by mouth every 4 (four) hours as needed for severe pain (moderate to severe pain (when tolerating fluids)).      phenazopyridine 100 MG tablet  Commonly known as:  PYRIDIUM  Take 1 tablet (100 mg total) by mouth 3 (three) times daily as needed for pain.     promethazine 25 MG tablet  Commonly known as:  PHENERGAN  Take 1 tablet (25 mg total) by mouth every 6 (six) hours as needed for nausea or vomiting.     SUMAtriptan 50 MG tablet  Commonly known as:  IMITREX  Take 1 tablet (50 mg total) by mouth every 2 (two) hours as needed for migraine. May repeat in 2 hours if headache persists or recurs.     Vitamin D 2000 units Caps  Take 2,000 Units by mouth daily.     zolpidem 10 MG tablet  Commonly known as:  AMBIEN  Take 10 mg by mouth at bedtime as needed for sleep.      ASK your doctor about these medications        meloxicam 15 MG tablet  Commonly known as:  MOBIC  Take 15 mg by mouth daily.         Signed: Ignatius Kloos C 02/24/2016, 9:54 AM

## 2016-02-24 NOTE — Progress Notes (Signed)
Pt   Ambulated   Out with husband

## 2016-02-24 NOTE — Progress Notes (Signed)
1 Day Post-Op Procedure(s) (LRB): LAPAROSCOPIC ASSISTED VAGINAL HYSTERECTOMY WITH bilateral SALPINGECTOMY, right oophorectomy. laporotic repair of incidental cystotomy. (Bilateral)  Subjective: Patient reports tolerating PO.    Objective: I have reviewed patient's vital signs, intake and output, medications and labs.  General: alert, cooperative, appears stated age and no distress GI: normal findings: soft, non-tender and decrease bowel sounds with mild distention Inc CD&I  Assessment: s/p Procedure(s): LAPAROSCOPIC ASSISTED VAGINAL HYSTERECTOMY WITH bilateral SALPINGECTOMY, right oophorectomy. laporotic repair of incidental cystotomy. (Bilateral): stable and ileus present  Plan: Encourage ambulation Advance to PO medication Discontinue IV fluids Clear liquids will adv diet and if ileus improves, can d/c home     Mustaf Antonacci C 02/24/2016, 9:48 AM

## 2016-02-24 NOTE — Op Note (Signed)
NAMESHAWNTE, Ann Frederick NO.:  [id number]  MEDICAL RECORD NO.:  [id number]  LOCATION:  9302                          FACILITY:  [facility]  PHYSICIAN:  Monia Sabal. Corinna Capra, M.D.    DATE OF BIRTH:  [birth year]  DATE OF PROCEDURE:  02/23/2016 DATE OF DISCHARGE:                              OPERATIVE REPORT   PREOPERATIVE DIAGNOSES:  Abnormal bleeding, menorrhagia, dysmenorrhea, pelvic pain, history of fibroids, previous cesarean section, previous myomectomy.  POSTOPERATIVE DIAGNOSES:  Abnormal bleeding, menorrhagia, dysmenorrhea, pelvic pain, history of fibroids, previous cesarean section, previous myomectomy, pelvic adhesions.  PROCEDURE:  Laparoscopic-assisted vaginal hysterectomy, bilateral salpingectomy, right oophorectomy and repair of incidental cystotomy.  SURGEON:  Monia Sabal. Corinna Capra, M.D.  ASSISTANT:  Dr. Linda Hedges.  INTRAOPERATIVE CONSULT:  Reece Packer, MD  ANESTHESIA:  General endotracheal.  INDICATIONS:  Ann Frederick is a 45 year old, with no further childbearing desires.  She is continued to have problems with abnormal bleeding, pelvic pain, dysmenorrhea, history of fibroids, history of previous C- sections and previous myomectomies in the past.  At this time, she desires definitive surgical intervention.  She has had abnormal bleeding, not controlled with Mirena IUD or estrogen.  Risks and benefits and pros and cons of the procedure were discussed at length. Discussed the possibility of having to abdominal disruption and she preferred the laparoscopically-assisted, discussed the risks and benefits, pros and cons, which included, but not limited to risk of infection, bleeding, damage to bowel, bladder, ureters, ovaries, risks associated with the general anesthesia and blood transfusion.  She does give her informed consent and wished to proceed.  FINDINGS AT THE TIME OF SURGERY:  Adhesions from the bowel and the omentum to the anterior abdominal wall.   Adhesions from the bladder to the entire anterior portion of the uterus into the anterior abdominal wall, adhesion to the right ovary to the pelvic sidewall, normal- appearing liver.  DESCRIPTION OF PROCEDURE:  After adequate analgesia, the patient was placed in dorsal lithotomy position, sterilely prepped and draped. Bladder was sterilely drained.  Graves speculum was placed.  A Cohen tenaculum was placed on the cervix.  A 1-cm infraumbilical skin incision was made.  A Veress needle was inserted.  The abdomen was insufflated and dullness to percussion.  An 11-mm trocar was inserted.  The above findings were noted by the laparoscope.  A 5-mm trocar was inserted and left of the midline 2 fingerbreadths above the pubic symphysis under direct visualization.  After careful and systematic evaluation of the abdomen and pelvis, the adhesions of the bowel and the omentum to the anterior abdominal wall were carefully lysed using Gyrus cutting forceps.  The left and right fallopian tubes were grasped and dissected across the mesosalpinx, on the left side down across the utero-ovarian ligament to the round ligament down to the inferior portions of the broad ligament, the right fallopian tube was adhered to the ovary and elevated the pelvic sidewall.  So, we proceeded across the utero-ovarian ligament down across the round ligament to the inferior portion of broad ligaments with good hemostasis achieved.  Adhesions to the anterior abdominal wall from the bladder were sharply dissected and the bladder was  dissected off the anterior surface of the uterus creating the bladder flap and transected the majority of this off creating a small bladder flap.  Legs were repositioned.  Abdomen was desufflated. Weighted speculum was placed in the vagina.  Posterior colpotomy was performed.  The cervix was circumscribed with Bovie cautery.  LigaSure instruments were used to ligate across the uterosacral  ligaments, bladder pillars bilaterally and the uterine vasculature bilaterally. The anterior vagina was dissected off the anterior portion of the cervix, bluntly pushing the bladder cephalad and entering the anterior peritoneum.  Deaver retractor was placed underneath the bladder. LigaSure instrument was used to ligate across the uterosacral ligaments and the uterine arteries bilaterally.  The fundus of the uterus was flipped and removed using thyroid tenaculum and the uterus was removed with the left tube and the right tube and ovary intact.  Uterosacral ligaments were identified bilaterally, suture ligated with figure-of- eight 0 Monocryl suture.  The posterior peritoneum was closed in a pursestring fashion using 0 Monocryl suture.  The vagina was then closed in a vertical fashion using figure-of-eights of 0 Monocryl suture with plication of the uterosacral ligaments in the midline.  Good support and bleeding was minimal.  Foley catheter was placed with return of bloody urine.  Legs were repositioned.  Abdomen reinsufflated after careful and systematic evaluation of the abdomen and pelvis where we could see a 2-3 cm incidental cystotomy along the dome of the bladder.  We were able to see the Foley catheter and able to clearly see within the bladder with Foley catheter noted.  Urologist, Dr. Nicki Reaper MacDiarmid, was in the OR and extorted.  He and his resident presented to the operating room and discussed and showed him on the camera the cystotomy.  He did not do laparoscopic treatment, so his recommendation was a double-layer closure and feels like it was safely way from the ureters and that should be sufficient.  So, his option was after he finished this case, she could open her with a laparotomy and performed that what we could do this laparoscopically.  I did proceed with a double-layer closure using a V- Tac 2-0 suture, this was done laparoscopically with first layer including the dome  of the bladder running across with good closure noted.  Second layer to the muscularis layer imbricating the first layer with good support with good approximation noted.  The patient was given methylene blue IV and the bladder was instilled with 300-400 mL without any evidence of leaking noted and good distention of the bladder noted. Copious amount of irrigation was then applied and after adequate hemostasis was assured, Gerota's were noted with good peristalsis bilaterally and minimal bleeding noted at this time.  To facilitate the closure of the bladder, a third trocar was placed in left of midline midway between the umbilicus and the pubic bone with a 5-mm trocar.  At this time, the abdomen was desufflated and the trocars were removed. The infraumbilical skin incision was closed with 0 Vicryl interrupted suture in the fascia, 3-0 Vicryl Rapide subcuticular suture.  The 5-mm sites were both closed with 3-0 Vicryl Rapide subcuticular suture and Dermabond.  Incisions were injected with 0.25% Marcaine, total of 10 mL used.  The patient was stable and transferred to the recovery room. Sponges and instruments counts were normal x3.  Estimated blood loss was 500 mL.  The patient received 2 g of cefotetan preoperatively.     Monia Sabal Corinna Capra, M.D.     DCL/MEDQ  D:  02/23/2016  T:  02/24/2016  Job:  [id number]

## 2016-02-24 NOTE — Plan of Care (Signed)
Problem: Health Behavior/Discharge Planning: Goal: Ability to manage health-related needs will improve Outcome: Completed/Met Date Met:  02/24/16 Pt will manage cath care at home leg bag given  To pt

## 2016-03-01 ENCOUNTER — Inpatient Hospital Stay (HOSPITAL_COMMUNITY): Payer: 59

## 2016-03-01 ENCOUNTER — Inpatient Hospital Stay (HOSPITAL_COMMUNITY)
Admission: AD | Admit: 2016-03-01 | Discharge: 2016-03-02 | Disposition: A | Discharge status: Home / Self Care | Payer: 59 | Source: Ambulatory Visit | Attending: Obstetrics and Gynecology | Admitting: Obstetrics and Gynecology

## 2016-03-01 ENCOUNTER — Encounter (HOSPITAL_COMMUNITY): Payer: Self-pay | Admitting: Radiology

## 2016-03-01 DIAGNOSIS — Z90710 Acquired absence of both cervix and uterus: Secondary | ICD-10-CM | POA: Diagnosis not present

## 2016-03-01 DIAGNOSIS — G8918 Other acute postprocedural pain: Secondary | ICD-10-CM | POA: Diagnosis not present

## 2016-03-01 DIAGNOSIS — R109 Unspecified abdominal pain: Secondary | ICD-10-CM | POA: Diagnosis present

## 2016-03-01 DIAGNOSIS — I10 Essential (primary) hypertension: Secondary | ICD-10-CM | POA: Diagnosis not present

## 2016-03-01 DIAGNOSIS — Z9889 Other specified postprocedural states: Secondary | ICD-10-CM | POA: Insufficient documentation

## 2016-03-01 DIAGNOSIS — R140 Abdominal distension (gaseous): Secondary | ICD-10-CM | POA: Diagnosis not present

## 2016-03-01 DIAGNOSIS — G43909 Migraine, unspecified, not intractable, without status migrainosus: Secondary | ICD-10-CM | POA: Insufficient documentation

## 2016-03-01 DIAGNOSIS — K5900 Constipation, unspecified: Secondary | ICD-10-CM | POA: Diagnosis not present

## 2016-03-01 DIAGNOSIS — D649 Anemia, unspecified: Secondary | ICD-10-CM | POA: Diagnosis not present

## 2016-03-01 DIAGNOSIS — K219 Gastro-esophageal reflux disease without esophagitis: Secondary | ICD-10-CM | POA: Diagnosis not present

## 2016-03-01 DIAGNOSIS — F418 Other specified anxiety disorders: Secondary | ICD-10-CM | POA: Insufficient documentation

## 2016-03-01 DIAGNOSIS — K5909 Other constipation: Secondary | ICD-10-CM | POA: Insufficient documentation

## 2016-03-01 LAB — CBC
HEMATOCRIT: 32.2 % — AB (ref 36.0–46.0)
HEMOGLOBIN: 10.6 g/dL — AB (ref 12.0–15.0)
MCH: 31 pg (ref 26.0–34.0)
MCHC: 32.9 g/dL (ref 30.0–36.0)
MCV: 94.2 fL (ref 78.0–100.0)
Platelets: 324 10*3/uL (ref 150–400)
RBC: 3.42 MIL/uL — AB (ref 3.87–5.11)
RDW: 14.7 % (ref 11.5–15.5)
WBC: 17.2 10*3/uL — AB (ref 4.0–10.5)

## 2016-03-01 LAB — URINE MICROSCOPIC-ADD ON

## 2016-03-01 LAB — COMPREHENSIVE METABOLIC PANEL
ALBUMIN: 3.6 g/dL (ref 3.5–5.0)
ALK PHOS: 70 U/L (ref 38–126)
ALT: 62 U/L — AB (ref 14–54)
AST: 46 U/L — AB (ref 15–41)
Anion gap: 7 (ref 5–15)
BILIRUBIN TOTAL: 1.1 mg/dL (ref 0.3–1.2)
BUN: 7 mg/dL (ref 6–20)
CALCIUM: 8.8 mg/dL — AB (ref 8.9–10.3)
CO2: 28 mmol/L (ref 22–32)
Chloride: 104 mmol/L (ref 101–111)
Creatinine, Ser: 0.64 mg/dL (ref 0.44–1.00)
GFR calc Af Amer: >60 mL/min (ref >60)
GLUCOSE: 126 mg/dL — AB (ref 65–99)
POTASSIUM: 3.8 mmol/L (ref 3.5–5.1)
Sodium: 139 mmol/L (ref 135–145)
TOTAL PROTEIN: 7.2 g/dL (ref 6.5–8.1)

## 2016-03-01 LAB — URINALYSIS, ROUTINE W REFLEX MICROSCOPIC
Bilirubin Urine: NEGATIVE
Glucose, UA: 100 mg/dL — AB
KETONES UR: NEGATIVE mg/dL
Nitrite: POSITIVE — AB
PROTEIN: 100 mg/dL — AB
Specific Gravity, Urine: 1.025 (ref 1.005–1.030)
pH: 5.5 (ref 5.0–8.0)

## 2016-03-01 MED ORDER — FENTANYL CITRATE (PF) 100 MCG/2ML IJ SOLN
50.0000 ug | Freq: Once | INTRAMUSCULAR | Status: AC
  Administered 2016-03-01: 50 ug via INTRAVENOUS
  Filled 2016-03-01: qty 2

## 2016-03-01 MED ORDER — CEFDINIR 300 MG PO CAPS: 300.0000 mg | ORAL_CAPSULE | Freq: Two times a day (BID) | ORAL | Status: DC

## 2016-03-01 MED ORDER — IOHEXOL 300 MG/ML  SOLN
25.0000 mL | Freq: Once | INTRAMUSCULAR | Status: AC | PRN
  Administered 2016-03-01: 50 mL via ORAL

## 2016-03-01 MED ORDER — OXYCODONE HCL 5 MG PO TABS: 5.0000 mg | ORAL_TABLET | ORAL | Status: DC | PRN

## 2016-03-01 MED ORDER — IOHEXOL 300 MG/ML  SOLN: 50.0000 mL | INTRAMUSCULAR | Status: AC

## 2016-03-01 MED ORDER — IOHEXOL 300 MG/ML  SOLN
100.0000 mL | Freq: Once | INTRAMUSCULAR | Status: AC | PRN
  Administered 2016-03-01: 100 mL via INTRAVENOUS

## 2016-03-01 NOTE — MAU Provider Note (Signed)
History     CSN: [id number]  Arrival date and time: 03/01/16 1836   First Provider Initiated Contact with Patient 03/01/16 1919          Chief Complaint  Patient presents with  . Post-op Problem   HPI  Ann Frederick is a 45 y.o. female who presents 1 week s/p LAVH. Was told to come in by Dr. Corinna Capra for CT exam.  Surgery was complicated by bladder injury. Injury was repaired in th OR & patient was sent home with foley catheter x 10 days.  Patient reports some nausea, no vomiting. Had "some" episodes of loose stools over the weekend; last BM was yesterday, states it was a normal soft BM.  Some continued lower abdominal pain since surgery that she is managing with percocet. Currently rates pain 3/10.  States is emptying foley bag every 2-3 hours and is getting on average 200 ml. Reports abdominal distension since Friday.  Denies fever, chills, or hematuria.   OB History    Gravida Para Term Preterm AB TAB SAB Ectopic Multiple Living   7 3 3  0 4 1 3  0 0 3      Past Medical History  Diagnosis Date  . Acne   . Heart murmur     ? heart murmur in past  . GERD (gastroesophageal reflux disease)     worse while pregnant  . Diabetes mellitus     gest this pregnancy  . Hypertension   . Depression   . Anxiety   . Headache     Migraines  . Anemia     history of  . History of blood transfusion 2013    Past Surgical History  Procedure Laterality Date  . Cesarean section      x 2  . Esophagogastroduodenoscopy  normal     7/12  . Cesarean section  09/29/2012    Procedure: CESAREAN SECTION;  Surgeon: Luz Lex, MD;  Location: [facility];  Service: Obstetrics;  Laterality: N/A;  Repeat edc 10/12/12/REQUEST;Chassity,Dee,Colleen  . Colonoscopy    . Mouth surgery    . Laparoscopic vaginal hysterectomy with salpingectomy Bilateral 02/23/2016    Procedure: LAPAROSCOPIC ASSISTED VAGINAL HYSTERECTOMY WITH bilateral SALPINGECTOMY, right oophorectomy. laporotic repair of incidental  cystotomy.;  Surgeon: Louretta Shorten, MD;  Location: [facility];  Service: Gynecology;  Laterality: Bilateral;    Family History  Problem Relation Age of Onset  . Depression Mother   . Hypertension Mother   . Diabetes Mother   . Obesity Mother   . Thyroid disease Mother   . Cancer Father     ? lung CA  . Kidney disease Father   . Hydrocephalus Sister   . Colon cancer Neg Hx     Social History  Substance Use Topics  . Smoking status: Never Smoker   . Smokeless tobacco: Never Used  . Alcohol Use: Yes     Comment: 1 glass of wine per week prior to preg    Allergies:  Allergies  Allergen Reactions  . Azithromycin Nausea And Vomiting    diarrhea    Prescriptions prior to admission  Medication Sig Dispense Refill Last Dose  . ALPRAZolam (XANAX) 0.5 MG tablet Take 0.5 mg by mouth at bedtime as needed for anxiety.   02/22/2016 at 2100  . aspirin 81 MG tablet Take 81 mg by mouth daily.   02/19/2016 at 2100  . b complex vitamins capsule Take 1 capsule by mouth daily.   02/22/2016 at 2100  .  Cholecalciferol (VITAMIN D) 2000 units CAPS Take 2,000 Units by mouth daily.    02/22/2016 at 2100  . clindamycin (CLINDAGEL) 1 % gel Apply 1 application topically daily. Use as directed.   Past Month at Unknown time  . cyclobenzaprine (FLEXERIL) 5 MG tablet 1 tab po prn tension ha (Patient taking differently: Take 5 mg by mouth daily as needed. For tension headaches) 20 tablet 0 Past Month at Unknown time  . diphenhydrAMINE (BENADRYL) 25 MG tablet Take 25 mg by mouth daily as needed for allergies.   02/18/2016 at 2100  . FLUoxetine (PROZAC) 40 MG capsule Take 40 mg by mouth daily. Take one by mouth at bedtime.   02/22/2016 at 2100  . hydrochlorothiazide (MICROZIDE) 12.5 MG capsule Take 1 capsule (12.5 mg total) by mouth daily. 90 capsule 1 02/23/2016 at 0445  . ibuprofen (ADVIL,MOTRIN) 600 MG tablet Take 1 tablet (600 mg total) by mouth every 6 (six) hours as needed (mild pain). 30 tablet 0   . meloxicam  (MOBIC) 15 MG tablet Take 15 mg by mouth daily.    Past Month at Unknown time  . nitrofurantoin (MACRODANTIN) 100 MG capsule Take 1 capsule (100 mg total) by mouth at bedtime. 10 capsule 0   . omeprazole (PRILOSEC) 20 MG capsule Take 1 capsule (20 mg total) by mouth daily. (Patient taking differently: Take 20-40 mg by mouth daily. Depends on indigestion) 90 capsule 1 02/22/2016 at 2100  . oxyCODONE-acetaminophen (PERCOCET/ROXICET) 5-325 MG tablet Take 1-2 tablets by mouth every 4 (four) hours as needed for severe pain (moderate to severe pain (when tolerating fluids)). 30 tablet 0   . phenazopyridine (PYRIDIUM) 100 MG tablet Take 1 tablet (100 mg total) by mouth 3 (three) times daily as needed for pain. 20 tablet 0   . promethazine (PHENERGAN) 25 MG tablet Take 1 tablet (25 mg total) by mouth every 6 (six) hours as needed for nausea or vomiting. 30 tablet 0   . SUMAtriptan (IMITREX) 50 MG tablet Take 1 tablet (50 mg total) by mouth every 2 (two) hours as needed for migraine. May repeat in 2 hours if headache persists or recurs. 16 tablet 0 Past Month at Unknown time  . zolpidem (AMBIEN) 10 MG tablet Take 10 mg by mouth at bedtime as needed for sleep.   5 02/22/2016 at 2100    Review of Systems  Constitutional: Negative.   Gastrointestinal: Positive for nausea and abdominal pain. Negative for vomiting, diarrhea and constipation.  Genitourinary: Negative.    Physical Exam   Blood pressure 134/92, pulse 114, temperature 97.9 F (36.6 C), resp. rate 18, last menstrual period 02/11/2016.  Physical Exam  Nursing note and vitals reviewed. Constitutional: She is oriented to person, place, and time. She appears well-developed and well-nourished. No distress.  HENT:  Head: Normocephalic and atraumatic.  Eyes: Conjunctivae are normal. Right eye exhibits no discharge. Left eye exhibits no discharge. No scleral icterus.  Neck: Normal range of motion.  Cardiovascular: Normal rate, regular rhythm and  normal heart sounds.   No murmur heard. Respiratory: Effort normal and breath sounds normal. No respiratory distress. She has no wheezes.  GI: Soft. She exhibits distension. Bowel sounds are decreased. There is tenderness in the right lower quadrant, suprapubic area and left lower quadrant. There is no rigidity and no guarding.  Neurological: She is alert and oriented to person, place, and time.  Skin: Skin is warm and dry. She is not diaphoretic.  Psychiatric: She has a normal mood and affect.  Her behavior is normal. Judgment and thought content normal.    MAU Course  Procedures Results for orders placed or performed during the hospital encounter of 03/01/16 (from the past 24 hour(s))  Urinalysis, Routine w reflex microscopic (not at [facility])     Status: Abnormal   Collection Time: 03/01/16  6:55 PM  Result Value Ref Range   Color, Urine YELLOW YELLOW   APPearance CLEAR CLEAR   Specific Gravity, Urine 1.025 1.005 - 1.030   pH 5.5 5.0 - 8.0   Glucose, UA 100 (A) NEGATIVE mg/dL   Hgb urine dipstick LARGE (A) NEGATIVE   Bilirubin Urine NEGATIVE NEGATIVE   Ketones, ur NEGATIVE NEGATIVE mg/dL   Protein, ur 100 (A) NEGATIVE mg/dL   Nitrite POSITIVE (A) NEGATIVE   Leukocytes, UA TRACE (A) NEGATIVE  Urine microscopic-add on     Status: Abnormal   Collection Time: 03/01/16  6:55 PM  Result Value Ref Range   Squamous Epithelial / LPF 0-5 (A) NONE SEEN   WBC, UA 0-5 0 - 5 WBC/hpf   RBC / HPF 6-30 0 - 5 RBC/hpf   Bacteria, UA MANY (A) NONE SEEN  CBC     Status: Abnormal   Collection Time: 03/01/16  7:02 PM  Result Value Ref Range   WBC 17.2 (H) 4.0 - 10.5 K/uL   RBC 3.42 (L) 3.87 - 5.11 MIL/uL   Hemoglobin 10.6 (L) 12.0 - 15.0 g/dL   HCT 32.2 (L) 36.0 - 46.0 %   MCV 94.2 78.0 - 100.0 fL   MCH 31.0 26.0 - 34.0 pg   MCHC 32.9 30.0 - 36.0 g/dL   RDW 14.7 11.5 - 15.5 %   Platelets 324 150 - 400 K/uL  Comprehensive metabolic panel     Status: Abnormal   Collection Time: 03/01/16  7:02 PM   Result Value Ref Range   Sodium 139 135 - 145 mmol/L   Potassium 3.8 3.5 - 5.1 mmol/L   Chloride 104 101 - 111 mmol/L   CO2 28 22 - 32 mmol/L   Glucose, Bld 126 (H) 65 - 99 mg/dL   BUN 7 6 - 20 mg/dL   Creatinine, Ser 0.64 0.44 - 1.00 mg/dL   Calcium 8.8 (L) 8.9 - 10.3 mg/dL   Total Protein 7.2 6.5 - 8.1 g/dL   Albumin 3.6 3.5 - 5.0 g/dL   AST 46 (H) 15 - 41 U/L   ALT 62 (H) 14 - 54 U/L   Alkaline Phosphatase 70 38 - 126 U/L   Total Bilirubin 1.1 0.3 - 1.2 mg/dL   GFR calc non Af Amer >60 >60 mL/min   GFR calc Af Amer >60 >60 mL/min   Anion gap 7 5 - 15   Ct Abdomen Pelvis W Contrast  03/01/2016  CLINICAL DATA:  Abdominal distention post hysterectomy 1 week prior. Surgery complicated by bladder injury requiring cystotomy. Patient with indwelling Foley catheter. Continued lower abdominal pain. EXAM: CT ABDOMEN AND PELVIS WITH CONTRAST TECHNIQUE: Multidetector CT imaging of the abdomen and pelvis was performed using the standard protocol following bolus administration of intravenous contrast. CONTRAST:  36mL OMNIPAQUE IOHEXOL 300 MG/ML SOLN, 135mL OMNIPAQUE IOHEXOL 300 MG/ML SOLN COMPARISON:  None. FINDINGS: Lower chest: The included lung bases are clear. No pleural effusion. Liver: Mild diffuse decreased density consistent with steatosis. No focal lesion. Hepatobiliary: Gallbladder physiologically distended, no calcified stone. No biliary dilatation. Pancreas: No ductal dilatation or inflammation. Spleen: Normal. Adrenal glands: No nodule. Kidneys: Symmetric renal enhancement. No hydronephrosis. Symmetric  homogeneous enhancement. There is symmetric renal excretion. Delayed imaging through the pelvis demonstrates contrast opacifying the distal ureters without extravasation. Stomach/Bowel: Stomach physiologically distended. There are no dilated or thickened small bowel loops. No obstruction, enteric contrast reaches the colon. Moderate diffuse stool burden throughout the entire colon without  colonic wall thickening. Scattered radiopaque densities in the colon are likely pills. The appendix is normal. Vascular/Lymphatic: Multiple small retroperitoneal and bilateral external iliac nodes, likely reactive. Abdominal aorta is normal in caliber. Reproductive: Post hysterectomy. There is soft tissue stranding in the pelvis consistent with recent surgery. Just superior to the vaginal cuff there is a 3.7 cm fluid collection. This is located anterior to the sigmoid colon and not abutting the urinary bladder. No internal air. No internal excreted intravenous contrast on delayed imaging. The left ovary is tentatively identified with a prominent follicle. Right ovary not confidently identified. Bladder: Foley catheter in place. No extravasation of excreted intravenous contrast on delayed phase imaging. There is bladder wall irregularity involving the dome, likely sites of cystotomy. No perivesicular fluid collection to suggest urinoma. Other: No free air. Postsurgical change in the anterior abdominal wall, no subcutaneous fluid collection. Musculoskeletal: There are no acute or suspicious osseous abnormalities. IMPRESSION: 1. Bladder dome regularity consistent with recent injury and cystotomy. No evidence of urinary leak. 2. Post recent hysterectomy with soft tissue stranding in the pelvis. There is a 3.7 cm fluid collection just superior to the vaginal cuff, that may reflect seroma, hematoma, or less likely developing abscess. No internal air. 3. Moderate diffuse stool burden, no bowel obstruction. Preliminary findings were discussed by telephone at the time of the exam on 03/01/2016 at 10:36 pm to Dr. Corinna Capra. Electronically Signed   By: Jeb Levering M.D.   On: 03/01/2016 22:46     MDM Pt states is due for pain medication (percocet) @ 8 pm & is requesting meds so she can stay "on top of her pain". Per Dr. Corinna Capra, order IV pain medication.  Fentanyl 50 mcg IV ordered per patient preference.  Urine culture  sent  Pt to radiology for CT scan. Care turned over to [place], NP 03/01/2016 9:48 PM   2251: Dr. Corinna Capra on the unit. He has seen the patient, and reviewed the CT scan results. Will give soap suds enema now, and send home on Omnicef for UTI.   Patient had soap suds enema with results. Patient requesting pain medication to use at home with no tylenol in it.  Assessment and Plan   1. Constipation, unspecified constipation type   2. Post-operative pain   3. Abdominal distension    DC home Comfort measures reviewed  RX: omnicef 300mg  BID x 10 days, roxicodone 5mg  #10  Return to MAU as needed FU with GYN  as planned  Follow-up Information    Follow up with LOWE,DAVID C, MD.   Specialty:  Obstetrics and Gynecology   Why:  As scheduled   Contact information:   [address], [place] [address] [place] [address] [phone]

## 2016-03-01 NOTE — MAU Note (Signed)
Pt presents to MAU for CT of abdomen and pelvis. Pt had a hysterectomy on February the 27th and her bladder was nicked. Pt has had loose stools at times and has a foley in place. Dr Corinna Capra sent pt over for CT

## 2016-03-01 NOTE — Discharge Instructions (Signed)

## 2016-03-02 ENCOUNTER — Encounter: Payer: Self-pay | Admitting: Medical

## 2016-03-02 ENCOUNTER — Encounter: Payer: 59 | Admitting: Medical

## 2016-03-02 ENCOUNTER — Telehealth: Payer: Self-pay | Admitting: Medical

## 2016-03-02 ENCOUNTER — Encounter: Payer: Managed Care, Other (non HMO) | Admitting: Family Medicine

## 2016-03-02 DIAGNOSIS — R739 Hyperglycemia, unspecified: Secondary | ICD-10-CM

## 2016-03-02 NOTE — Telephone Encounter (Signed)
Relationship to patient: Self  Can be reached: [phone]    Reason for call: pt called in at 9:20 to rescheduled her CPE appt for Monday.    Should pt be charged?

## 2016-03-02 NOTE — Telephone Encounter (Signed)
See note below

## 2016-03-02 NOTE — Progress Notes (Signed)
This encounter was created in error - please disregard.

## 2016-03-02 NOTE — Telephone Encounter (Signed)
No charge was in ED last night.

## 2016-03-04 LAB — URINE CULTURE

## 2016-03-05 ENCOUNTER — Telehealth: Payer: Self-pay | Admitting: Behavioral Health

## 2016-03-05 NOTE — Telephone Encounter (Signed)
Unable to reach patient at time of Pre-Visit Call.  Left message for patient to return call when available.    

## 2016-03-08 ENCOUNTER — Ambulatory Visit (INDEPENDENT_AMBULATORY_CARE_PROVIDER_SITE_OTHER): Payer: 59 | Admitting: Medical

## 2016-03-08 ENCOUNTER — Encounter: Payer: Self-pay | Admitting: Medical

## 2016-03-08 VITALS — BP 114/72 | HR 86 | Temp 98.2°F | Ht 62.0 in | Wt 184.0 lb

## 2016-03-08 DIAGNOSIS — Z0000 Encounter for general adult medical examination without abnormal findings: Secondary | ICD-10-CM

## 2016-03-08 DIAGNOSIS — Z1321 Encounter for screening for nutritional disorder: Secondary | ICD-10-CM

## 2016-03-08 DIAGNOSIS — R82998 Other abnormal findings in urine: Secondary | ICD-10-CM

## 2016-03-08 LAB — POC URINALSYSI DIPSTICK (AUTOMATED)
Bilirubin, UA: NEGATIVE
Glucose, UA: NEGATIVE
NITRITE UA: NEGATIVE
PH UA: 5.5
Spec Grav, UA: >=1.03
Urobilinogen, UA: 0.2

## 2016-03-08 MED ORDER — HYDROCHLOROTHIAZIDE 12.5 MG PO CAPS: 12.5000 mg | ORAL_CAPSULE | Freq: Every day | ORAL | Status: DC

## 2016-03-08 NOTE — Telephone Encounter (Signed)
Recent hyperglycemia. Will gut in future a1-c.

## 2016-03-08 NOTE — Patient Instructions (Addendum)
Wellness examination Will get future fating cbc,cmp, tsh, lipid panel and ua.    For fatty liver and mild liver elevation  try to keep weight under control and avoid tylenol, fructose  and alcohol. Exercise when cleared from recent surgery.  Will get a1-c in future of you mild elevated sugar levels.  Follow up date to be determined after lab review.  Preventive Care for Adults, Female A healthy lifestyle and preventive care can promote health and wellness. Preventive health guidelines for women include the following key practices.  A routine yearly physical is a good way to check with your health care provider about your health and preventive screening. It is a chance to share any concerns and updates on your health and to receive a thorough exam.  Visit your dentist for a routine exam and preventive care every 6 months. Brush your teeth twice a day and floss once a day. Good oral hygiene prevents tooth decay and gum disease.  The frequency of eye exams is based on your age, health, family medical history, use of contact lenses, and other factors. Follow your health care provider's recommendations for frequency of eye exams.  Eat a healthy diet. Foods like vegetables, fruits, whole grains, low-fat dairy products, and lean protein foods contain the nutrients you need without too many calories. Decrease your intake of foods high in solid fats, added sugars, and salt. Eat the right amount of calories for you.Get information about a proper diet from your health care provider, if necessary.  Regular physical exercise is one of the most important things you can do for your health. Most adults should get at least 150 minutes of moderate-intensity exercise (any activity that increases your heart rate and causes you to sweat) each week. In addition, most adults need muscle-strengthening exercises on 2 or more days a week.  Maintain a healthy weight. The body mass index (BMI) is a screening tool to  identify possible weight problems. It provides an estimate of body fat based on height and weight. Your health care provider can find your BMI and can help you achieve or maintain a healthy weight.For adults 20 years and older:  A BMI below 18.5 is considered underweight.  A BMI of 18.5 to 24.9 is normal.  A BMI of 25 to 29.9 is considered overweight.  A BMI of 30 and above is considered obese.  Maintain normal blood lipids and cholesterol levels by exercising and minimizing your intake of saturated fat. Eat a balanced diet with plenty of fruit and vegetables. Blood tests for lipids and cholesterol should begin at age 42 and be repeated every 5 years. If your lipid or cholesterol levels are high, you are over 50, or you are at high risk for heart disease, you may need your cholesterol levels checked more frequently.Ongoing high lipid and cholesterol levels should be treated with medicines if diet and exercise are not working.  If you smoke, find out from your health care provider how to quit. If you do not use tobacco, do not start.  Lung cancer screening is recommended for adults aged 56-80 years who are at high risk for developing lung cancer because of a history of smoking. A yearly low-dose CT scan of the lungs is recommended for people who have at least a 30-pack-year history of smoking and are a current smoker or have quit within the past 15 years. A pack year of smoking is smoking an average of 1 pack of cigarettes a day for 1 year (  for example: 1 pack a day for 30 years or 2 packs a day for 15 years). Yearly screening should continue until the smoker has stopped smoking for at least 15 years. Yearly screening should be stopped for people who develop a health problem that would prevent them from having lung cancer treatment.  If you are pregnant, do not drink alcohol. If you are breastfeeding, be very cautious about drinking alcohol. If you are not pregnant and choose to drink alcohol, do  not have more than 1 drink per day. One drink is considered to be 12 ounces (355 mL) of beer, 5 ounces (148 mL) of wine, or 1.5 ounces (44 mL) of liquor.  Avoid use of street drugs. Do not share needles with anyone. Ask for help if you need support or instructions about stopping the use of drugs.  High blood pressure causes heart disease and increases the risk of stroke. Your blood pressure should be checked at least every 1 to 2 years. Ongoing high blood pressure should be treated with medicines if weight loss and exercise do not work.  If you are 6-70 years old, ask your health care provider if you should take aspirin to prevent strokes.  Diabetes screening is done by taking a blood sample to check your blood glucose level after you have not eaten for a certain period of time (fasting). If you are not overweight and you do not have risk factors for diabetes, you should be screened once every 3 years starting at age 78. If you are overweight or obese and you are 60-10 years of age, you should be screened for diabetes every year as part of your cardiovascular risk assessment.  Breast cancer screening is essential preventive care for women. You should practice "breast self-awareness." This means understanding the normal appearance and feel of your breasts and may include breast self-examination. Any changes detected, no matter how small, should be reported to a health care provider. Women in their 34s and 30s should have a clinical breast exam (CBE) by a health care provider as part of a regular health exam every 1 to 3 years. After age 4, women should have a CBE every year. Starting at age 3, women should consider having a mammogram (breast X-ray test) every year. Women who have a family history of breast cancer should talk to their health care provider about genetic screening. Women at a high risk of breast cancer should talk to their health care providers about having an MRI and a mammogram every  year.  Breast cancer gene (BRCA)-related cancer risk assessment is recommended for women who have family members with BRCA-related cancers. BRCA-related cancers include breast, ovarian, tubal, and peritoneal cancers. Having family members with these cancers may be associated with an increased risk for harmful changes (mutations) in the breast cancer genes BRCA1 and BRCA2. Results of the assessment will determine the need for genetic counseling and BRCA1 and BRCA2 testing.  Your health care provider may recommend that you be screened regularly for cancer of the pelvic organs (ovaries, uterus, and vagina). This screening involves a pelvic examination, including checking for microscopic changes to the surface of your cervix (Pap test). You may be encouraged to have this screening done every 3 years, beginning at age 15.  For women ages 71-65, health care providers may recommend pelvic exams and Pap testing every 3 years, or they may recommend the Pap and pelvic exam, combined with testing for human papilloma virus (HPV), every 5 years. Some types  of HPV increase your risk of cervical cancer. Testing for HPV may also be done on women of any age with unclear Pap test results.  Other health care providers may not recommend any screening for nonpregnant women who are considered low risk for pelvic cancer and who do not have symptoms. Ask your health care provider if a screening pelvic exam is right for you.  If you have had past treatment for cervical cancer or a condition that could lead to cancer, you need Pap tests and screening for cancer for at least 20 years after your treatment. If Pap tests have been discontinued, your risk factors (such as having a new sexual partner) need to be reassessed to determine if screening should resume. Some women have medical problems that increase the chance of getting cervical cancer. In these cases, your health care provider may recommend more frequent screening and Pap  tests.  Colorectal cancer can be detected and often prevented. Most routine colorectal cancer screening begins at the age of 66 years and continues through age 19 years. However, your health care provider may recommend screening at an earlier age if you have risk factors for colon cancer. On a yearly basis, your health care provider may provide home test kits to check for hidden blood in the stool. Use of a small camera at the end of a tube, to directly examine the colon (sigmoidoscopy or colonoscopy), can detect the earliest forms of colorectal cancer. Talk to your health care provider about this at age 80, when routine screening begins. Direct exam of the colon should be repeated every 5-10 years through age 71 years, unless early forms of precancerous polyps or small growths are found.  People who are at an increased risk for hepatitis B should be screened for this virus. You are considered at high risk for hepatitis B if:  You were born in a country where hepatitis B occurs often. Talk with your health care provider about which countries are considered high risk.  Your parents were born in a high-risk country and you have not received a shot to protect against hepatitis B (hepatitis B vaccine).  You have HIV or AIDS.  You use needles to inject street drugs.  You live with, or have sex with, someone who has hepatitis B.  You get hemodialysis treatment.  You take certain medicines for conditions like cancer, organ transplantation, and autoimmune conditions.  Hepatitis C blood testing is recommended for all people born from [birth year] through 1965 and any individual with known risks for hepatitis C.  Practice safe sex. Use condoms and avoid high-risk sexual practices to reduce the spread of sexually transmitted infections (STIs). STIs include gonorrhea, chlamydia, syphilis, trichomonas, herpes, HPV, and human immunodeficiency virus (HIV). Herpes, HIV, and HPV are viral illnesses that have no cure.  They can result in disability, cancer, and death.  You should be screened for sexually transmitted illnesses (STIs) including gonorrhea and chlamydia if:  You are sexually active and are younger than 24 years.  You are older than 24 years and your health care provider tells you that you are at risk for this type of infection.  Your sexual activity has changed since you were last screened and you are at an increased risk for chlamydia or gonorrhea. Ask your health care provider if you are at risk.  If you are at risk of being infected with HIV, it is recommended that you take a prescription medicine daily to prevent HIV infection. This is called  preexposure prophylaxis (PrEP). You are considered at risk if:  You are sexually active and do not regularly use condoms or know the HIV status of your partner(s).  You take drugs by injection.  You are sexually active with a partner who has HIV.  Talk with your health care provider about whether you are at high risk of being infected with HIV. If you choose to begin PrEP, you should first be tested for HIV. You should then be tested every 3 months for as long as you are taking PrEP.  Osteoporosis is a disease in which the bones lose minerals and strength with aging. This can result in serious bone fractures or breaks. The risk of osteoporosis can be identified using a bone density scan. Women ages 12 years and over and women at risk for fractures or osteoporosis should discuss screening with their health care providers. Ask your health care provider whether you should take a calcium supplement or vitamin D to reduce the rate of osteoporosis.  Menopause can be associated with physical symptoms and risks. Hormone replacement therapy is available to decrease symptoms and risks. You should talk to your health care provider about whether hormone replacement therapy is right for you.  Use sunscreen. Apply sunscreen liberally and repeatedly throughout the  day. You should seek shade when your shadow is shorter than you. Protect yourself by wearing long sleeves, pants, a wide-brimmed hat, and sunglasses year round, whenever you are outdoors.  Once a month, do a whole body skin exam, using a mirror to look at the skin on your back. Tell your health care provider of new moles, moles that have irregular borders, moles that are larger than a pencil eraser, or moles that have changed in shape or color.  Stay current with required vaccines (immunizations).  Influenza vaccine. All adults should be immunized every year.  Tetanus, diphtheria, and acellular pertussis (Td, Tdap) vaccine. Pregnant women should receive 1 dose of Tdap vaccine during each pregnancy. The dose should be obtained regardless of the length of time since the last dose. Immunization is preferred during the 27th-36th week of gestation. An adult who has not previously received Tdap or who does not know her vaccine status should receive 1 dose of Tdap. This initial dose should be followed by tetanus and diphtheria toxoids (Td) booster doses every 10 years. Adults with an unknown or incomplete history of completing a 3-dose immunization series with Td-containing vaccines should begin or complete a primary immunization series including a Tdap dose. Adults should receive a Td booster every 10 years.  Varicella vaccine. An adult without evidence of immunity to varicella should receive 2 doses or a second dose if she has previously received 1 dose. Pregnant females who do not have evidence of immunity should receive the first dose after pregnancy. This first dose should be obtained before leaving the health care facility. The second dose should be obtained 4-8 weeks after the first dose.  Human papillomavirus (HPV) vaccine. Females aged 13-26 years who have not received the vaccine previously should obtain the 3-dose series. The vaccine is not recommended for use in pregnant females. However, pregnancy  testing is not needed before receiving a dose. If a female is found to be pregnant after receiving a dose, no treatment is needed. In that case, the remaining doses should be delayed until after the pregnancy. Immunization is recommended for any person with an immunocompromised condition through the age of 74 years if she did not get any or all  doses earlier. During the 3-dose series, the second dose should be obtained 4-8 weeks after the first dose. The third dose should be obtained 24 weeks after the first dose and 16 weeks after the second dose.  Zoster vaccine. One dose is recommended for adults aged 75 years or older unless certain conditions are present.  Measles, mumps, and rubella (MMR) vaccine. Adults born before [birth year] generally are considered immune to measles and mumps. Adults born in [birth year] or later should have 1 or more doses of MMR vaccine unless there is a contraindication to the vaccine or there is laboratory evidence of immunity to each of the three diseases. A routine second dose of MMR vaccine should be obtained at least 28 days after the first dose for students attending postsecondary schools, health care workers, or international travelers. People who received inactivated measles vaccine or an unknown type of measles vaccine during 1963-1967 should receive 2 doses of MMR vaccine. People who received inactivated mumps vaccine or an unknown type of mumps vaccine before 1979 and are at high risk for mumps infection should consider immunization with 2 doses of MMR vaccine. For females of childbearing age, rubella immunity should be determined. If there is no evidence of immunity, females who are not pregnant should be vaccinated. If there is no evidence of immunity, females who are pregnant should delay immunization until after pregnancy. Unvaccinated health care workers born before [birth year] who lack laboratory evidence of measles, mumps, or rubella immunity or laboratory confirmation of disease should  consider measles and mumps immunization with 2 doses of MMR vaccine or rubella immunization with 1 dose of MMR vaccine.  Pneumococcal 13-valent conjugate (PCV13) vaccine. When indicated, a person who is uncertain of his immunization history and has no record of immunization should receive the PCV13 vaccine. All adults 75 years of age and older should receive this vaccine. An adult aged 54 years or older who has certain medical conditions and has not been previously immunized should receive 1 dose of PCV13 vaccine. This PCV13 should be followed with a dose of pneumococcal polysaccharide (PPSV23) vaccine. Adults who are at high risk for pneumococcal disease should obtain the PPSV23 vaccine at least 8 weeks after the dose of PCV13 vaccine. Adults older than 45 years of age who have normal immune system function should obtain the PPSV23 vaccine dose at least 1 year after the dose of PCV13 vaccine.  Pneumococcal polysaccharide (PPSV23) vaccine. When PCV13 is also indicated, PCV13 should be obtained first. All adults aged 63 years and older should be immunized. An adult younger than age 57 years who has certain medical conditions should be immunized. Any person who resides in a nursing home or long-term care facility should be immunized. An adult smoker should be immunized. People with an immunocompromised condition and certain other conditions should receive both PCV13 and PPSV23 vaccines. People with human immunodeficiency virus (HIV) infection should be immunized as soon as possible after diagnosis. Immunization during chemotherapy or radiation therapy should be avoided. Routine use of PPSV23 vaccine is not recommended for American Indians, [place] Natives, or people younger than 65 years unless there are medical conditions that require PPSV23 vaccine. When indicated, people who have unknown immunization and have no record of immunization should receive PPSV23 vaccine. One-time revaccination 5 years after the first  dose of PPSV23 is recommended for people aged 19-64 years who have chronic kidney failure, nephrotic syndrome, asplenia, or immunocompromised conditions. People who received 1-2 doses of PPSV23 before age 24 years should receive  another dose of PPSV23 vaccine at age 22 years or later if at least 5 years have passed since the previous dose. Doses of PPSV23 are not needed for people immunized with PPSV23 at or after age 52 years.  Meningococcal vaccine. Adults with asplenia or persistent complement component deficiencies should receive 2 doses of quadrivalent meningococcal conjugate (MenACWY-D) vaccine. The doses should be obtained at least 2 months apart. Microbiologists working with certain meningococcal bacteria, [place] recruits, people at risk during an outbreak, and people who travel to or live in countries with a high rate of meningitis should be immunized. A first-year college student up through age 23 years who is living in a residence hall should receive a dose if she did not receive a dose on or after her 16th birthday. Adults who have certain high-risk conditions should receive one or more doses of vaccine.  Hepatitis A vaccine. Adults who wish to be protected from this disease, have certain high-risk conditions, work with hepatitis A-infected animals, work in hepatitis A research labs, or travel to or work in countries with a high rate of hepatitis A should be immunized. Adults who were previously unvaccinated and who anticipate close contact with an international adoptee during the first 60 days after arrival in the [place] from a country with a high rate of hepatitis A should be immunized.  Hepatitis B vaccine. Adults who wish to be protected from this disease, have certain high-risk conditions, may be exposed to blood or other infectious body fluids, are household contacts or sex partners of hepatitis B positive people, are clients or workers in certain care facilities, or travel to or  work in countries with a high rate of hepatitis B should be immunized.  Haemophilus influenzae type b (Hib) vaccine. A previously unvaccinated person with asplenia or sickle cell disease or having a scheduled splenectomy should receive 1 dose of Hib vaccine. Regardless of previous immunization, a recipient of a hematopoietic stem cell transplant should receive a 3-dose series 6-12 months after her successful transplant. Hib vaccine is not recommended for adults with HIV infection. Preventive Services / Frequency Ages 47 to 22 years  Blood pressure check.** / Every 3-5 years.  Lipid and cholesterol check.** / Every 5 years beginning at age 28.  Clinical breast exam.** / Every 3 years for women in their 45s and 92s.  BRCA-related cancer risk assessment.** / For women who have family members with a BRCA-related cancer (breast, ovarian, tubal, or peritoneal cancers).  Pap test.** / Every 2 years from ages 88 through 49. Every 3 years starting at age 57 through age 56 or 6 with a history of 3 consecutive normal Pap tests.  HPV screening.** / Every 3 years from ages 53 through ages 66 to 54 with a history of 3 consecutive normal Pap tests.  Hepatitis C blood test.** / For any individual with known risks for hepatitis C.  Skin self-exam. / Monthly.  Influenza vaccine. / Every year.  Tetanus, diphtheria, and acellular pertussis (Tdap, Td) vaccine.** / Consult your health care provider. Pregnant women should receive 1 dose of Tdap vaccine during each pregnancy. 1 dose of Td every 10 years.  Varicella vaccine.** / Consult your health care provider. Pregnant females who do not have evidence of immunity should receive the first dose after pregnancy.  HPV vaccine. / 3 doses over 6 months, if 53 and younger. The vaccine is not recommended for use in pregnant females. However, pregnancy testing is not needed before receiving a dose.  Measles, mumps, rubella (MMR) vaccine.** / You need at least 1 dose  of MMR if you were born in 1957 or later. You may also need a 2nd dose. For females of childbearing age, rubella immunity should be determined. If there is no evidence of immunity, females who are not pregnant should be vaccinated. If there is no evidence of immunity, females who are pregnant should delay immunization until after pregnancy.  Pneumococcal 13-valent conjugate (PCV13) vaccine.** / Consult your health care provider.  Pneumococcal polysaccharide (PPSV23) vaccine.** / 1 to 2 doses if you smoke cigarettes or if you have certain conditions.  Meningococcal vaccine.** / 1 dose if you are age 45 to 73 years and a Market researcher living in a residence hall, or have one of several medical conditions, you need to get vaccinated against meningococcal disease. You may also need additional booster doses.  Hepatitis A vaccine.** / Consult your health care provider.  Hepatitis B vaccine.** / Consult your health care provider.  Haemophilus influenzae type b (Hib) vaccine.** / Consult your health care provider. Ages 12 to 77 years  Blood pressure check.** / Every year.  Lipid and cholesterol check.** / Every 5 years beginning at age 54 years.  Lung cancer screening. / Every year if you are aged 76-80 years and have a 30-pack-year history of smoking and currently smoke or have quit within the past 15 years. Yearly screening is stopped once you have quit smoking for at least 15 years or develop a health problem that would prevent you from having lung cancer treatment.  Clinical breast exam.** / Every year after age 44 years.  BRCA-related cancer risk assessment.** / For women who have family members with a BRCA-related cancer (breast, ovarian, tubal, or peritoneal cancers).  Mammogram.** / Every year beginning at age 55 years and continuing for as long as you are in good health. Consult with your health care provider.  Pap test.** / Every 3 years starting at age 61 years through age  73 or 77 years with a history of 3 consecutive normal Pap tests.  HPV screening.** / Every 3 years from ages 55 years through ages 85 to 22 years with a history of 3 consecutive normal Pap tests.  Fecal occult blood test (FOBT) of stool. / Every year beginning at age 15 years and continuing until age 3 years. You may not need to do this test if you get a colonoscopy every 10 years.  Flexible sigmoidoscopy or colonoscopy.** / Every 5 years for a flexible sigmoidoscopy or every 10 years for a colonoscopy beginning at age 40 years and continuing until age 43 years.  Hepatitis C blood test.** / For all people born from [birth year] through 1965 and any individual with known risks for hepatitis C.  Skin self-exam. / Monthly.  Influenza vaccine. / Every year.  Tetanus, diphtheria, and acellular pertussis (Tdap/Td) vaccine.** / Consult your health care provider. Pregnant women should receive 1 dose of Tdap vaccine during each pregnancy. 1 dose of Td every 10 years.  Varicella vaccine.** / Consult your health care provider. Pregnant females who do not have evidence of immunity should receive the first dose after pregnancy.  Zoster vaccine.** / 1 dose for adults aged 35 years or older.  Measles, mumps, rubella (MMR) vaccine.** / You need at least 1 dose of MMR if you were born in 1957 or later. You may also need a second dose. For females of childbearing age, rubella immunity should be determined. If there is no  evidence of immunity, females who are not pregnant should be vaccinated. If there is no evidence of immunity, females who are pregnant should delay immunization until after pregnancy.  Pneumococcal 13-valent conjugate (PCV13) vaccine.** / Consult your health care provider.  Pneumococcal polysaccharide (PPSV23) vaccine.** / 1 to 2 doses if you smoke cigarettes or if you have certain conditions.  Meningococcal vaccine.** / Consult your health care provider.  Hepatitis A vaccine.** / Consult your  health care provider.  Hepatitis B vaccine.** / Consult your health care provider.  Haemophilus influenzae type b (Hib) vaccine.** / Consult your health care provider. Ages 82 years and over  Blood pressure check.** / Every year.  Lipid and cholesterol check.** / Every 5 years beginning at age 22 years.  Lung cancer screening. / Every year if you are aged 44-80 years and have a 30-pack-year history of smoking and currently smoke or have quit within the past 15 years. Yearly screening is stopped once you have quit smoking for at least 15 years or develop a health problem that would prevent you from having lung cancer treatment.  Clinical breast exam.** / Every year after age 68 years.  BRCA-related cancer risk assessment.** / For women who have family members with a BRCA-related cancer (breast, ovarian, tubal, or peritoneal cancers).  Mammogram.** / Every year beginning at age 10 years and continuing for as long as you are in good health. Consult with your health care provider.  Pap test.** / Every 3 years starting at age 82 years through age 101 or 26 years with 3 consecutive normal Pap tests. Testing can be stopped between 65 and 70 years with 3 consecutive normal Pap tests and no abnormal Pap or HPV tests in the past 10 years.  HPV screening.** / Every 3 years from ages 48 years through ages 9 or 1 years with a history of 3 consecutive normal Pap tests. Testing can be stopped between 65 and 70 years with 3 consecutive normal Pap tests and no abnormal Pap or HPV tests in the past 10 years.  Fecal occult blood test (FOBT) of stool. / Every year beginning at age 3 years and continuing until age 34 years. You may not need to do this test if you get a colonoscopy every 10 years.  Flexible sigmoidoscopy or colonoscopy.** / Every 5 years for a flexible sigmoidoscopy or every 10 years for a colonoscopy beginning at age 70 years and continuing until age 66 years.  Hepatitis C blood test.** /  For all people born from [birth year] through 1965 and any individual with known risks for hepatitis C.  Osteoporosis screening.** / A one-time screening for women ages 36 years and over and women at risk for fractures or osteoporosis.  Skin self-exam. / Monthly.  Influenza vaccine. / Every year.  Tetanus, diphtheria, and acellular pertussis (Tdap/Td) vaccine.** / 1 dose of Td every 10 years.  Varicella vaccine.** / Consult your health care provider.  Zoster vaccine.** / 1 dose for adults aged 79 years or older.  Pneumococcal 13-valent conjugate (PCV13) vaccine.** / Consult your health care provider.  Pneumococcal polysaccharide (PPSV23) vaccine.** / 1 dose for all adults aged 33 years and older.  Meningococcal vaccine.** / Consult your health care provider.  Hepatitis A vaccine.** / Consult your health care provider.  Hepatitis B vaccine.** / Consult your health care provider.  Haemophilus influenzae type b (Hib) vaccine.** / Consult your health care provider. ** Family history and personal history of risk and conditions may change your health  care provider's recommendations.   This information is not intended to replace advice given to you by your health care provider. Make sure you discuss any questions you have with your health care provider.   Document Released: 02/08/2002 Document Revised: 01/03/2015 Document Reviewed: 05/10/2011 Elsevier Interactive Patient Education [employer].

## 2016-03-08 NOTE — Progress Notes (Signed)
Subjective:    Patient ID: Ann Frederick, female    DOB: 24-May-1971, 45 y.o.   MRN: [id number]  HPI  I have reviewed pt PMH, PSH, FH, Social History and Surgical History.  Pt health maintenance issues are up to date.  Pt had c-section recently(2weeks ago). Pt has everything removed  but left ovary.  Pt had some mild fatty liver on CT and mild lft increase.  January 2017 she had mammogram and study was negative/normal.  Pt give me update. Pt has stopped omeprazole. Her reflux has been controlled.  Pt was cleared to slowly exercising starting at 6 weeks. Pt will just do light walking until 6 weeks post surgery.   Pt is not fasting today.     Review of Systems  Constitutional: Negative for fever, chills and fatigue.  HENT: Negative for congestion.   Cardiovascular: Negative for chest pain and palpitations.  Gastrointestinal: Negative for nausea, vomiting, abdominal pain, diarrhea, constipation, blood in stool and abdominal distention.  Genitourinary: Negative for dysuria and difficulty urinating.  Musculoskeletal: Negative for myalgias, back pain, joint swelling and neck stiffness.  Skin: Negative for rash.  Neurological: Negative for dizziness and headaches.  Hematological: Negative for adenopathy. Does not bruise/bleed easily.  Psychiatric/Behavioral: Negative for behavioral problems, confusion, sleep disturbance and dysphoric mood. The patient is not nervous/anxious.    Past Medical History  Diagnosis Date  . Acne   . Heart murmur     ? heart murmur in past  . GERD (gastroesophageal reflux disease)     worse while pregnant  . Diabetes mellitus     gest this pregnancy  . Hypertension   . Depression   . Anxiety   . Headache     Migraines  . Anemia     history of  . History of blood transfusion 2013    Social History   Social History  . Marital Status: Married    Spouse Name: N/A  . Number of Children: 2  . Years of Education: N/A   Occupational History    . Not on file.   Social History Main Topics  . Smoking status: Never Smoker   . Smokeless tobacco: Never Used  . Alcohol Use: Yes     Comment: 1 glass of wine per week prior to preg  . Drug Use: No  . Sexual Activity: Yes   Other Topics Concern  . Not on file   Social History Narrative    Past Surgical History  Procedure Laterality Date  . Cesarean section      x 2  . Esophagogastroduodenoscopy  normal     7/12  . Cesarean section  09/29/2012    Procedure: CESAREAN SECTION;  Surgeon: Luz Lex, MD;  Location: [facility];  Service: Obstetrics;  Laterality: N/A;  Repeat edc 10/12/12/REQUEST;Chassity,Dee,Colleen  . Colonoscopy    . Mouth surgery    . Laparoscopic vaginal hysterectomy with salpingectomy Bilateral 02/23/2016    Procedure: LAPAROSCOPIC ASSISTED VAGINAL HYSTERECTOMY WITH bilateral SALPINGECTOMY, right oophorectomy. laporotic repair of incidental cystotomy.;  Surgeon: Louretta Shorten, MD;  Location: [facility];  Service: Gynecology;  Laterality: Bilateral;    Family History  Problem Relation Age of Onset  . Depression Mother   . Hypertension Mother   . Diabetes Mother   . Obesity Mother   . Thyroid disease Mother   . Cancer Father     ? lung CA  . Kidney disease Father   . Hydrocephalus Sister   . Colon cancer  Neg Hx     Allergies  Allergen Reactions  . Azithromycin Nausea And Vomiting    diarrhea    Current Outpatient Prescriptions on File Prior to Visit  Medication Sig Dispense Refill  . ALPRAZolam (XANAX) 0.5 MG tablet Take 0.5 mg by mouth 2 (two) times daily as needed for anxiety.     Marland Kitchen aspirin 81 MG tablet Take 81 mg by mouth daily.    Marland Kitchen b complex vitamins capsule Take 1 capsule by mouth daily.    . Cholecalciferol (VITAMIN D) 2000 units CAPS Take 2,000 Units by mouth daily.     Marland Kitchen FLUoxetine (PROZAC) 40 MG capsule Take 40 mg by mouth daily.     Marland Kitchen zolpidem (AMBIEN) 10 MG tablet Take 10 mg by mouth at bedtime as needed for sleep.   5  . [DISCONTINUED]  pantoprazole (PROTONIX) 40 MG tablet Take 40 mg by mouth daily before breakfast.       No current facility-administered medications on file prior to visit.    BP 114/72 mmHg  Pulse 86  Temp(Src) 98.2 F (36.8 C) (Oral)  Ht 5\' 2"  (1.575 m)  Wt 184 lb (83.462 kg)  BMI 33.65 kg/m2  SpO2 98%  LMP 02/11/2016 (Exact Date)     Past Medical History  Diagnosis Date  . Acne   . Heart murmur     ? heart murmur in past  . GERD (gastroesophageal reflux disease)     worse while pregnant  . Diabetes mellitus     gest this pregnancy  . Hypertension   . Depression   . Anxiety   . Headache     Migraines  . Anemia     history of  . History of blood transfusion 2013    Social History   Social History  . Marital Status: Married    Spouse Name: N/A  . Number of Children: 2  . Years of Education: N/A   Occupational History  . Not on file.   Social History Main Topics  . Smoking status: Never Smoker   . Smokeless tobacco: Never Used  . Alcohol Use: Yes     Comment: 1 glass of wine per week prior to preg  . Drug Use: No  . Sexual Activity: Yes   Other Topics Concern  . Not on file   Social History Narrative    Past Surgical History  Procedure Laterality Date  . Cesarean section      x 2  . Esophagogastroduodenoscopy  normal     7/12  . Cesarean section  09/29/2012    Procedure: CESAREAN SECTION;  Surgeon: Luz Lex, MD;  Location: [facility];  Service: Obstetrics;  Laterality: N/A;  Repeat edc 10/12/12/REQUEST;Chassity,Dee,Colleen  . Colonoscopy    . Mouth surgery    . Laparoscopic vaginal hysterectomy with salpingectomy Bilateral 02/23/2016    Procedure: LAPAROSCOPIC ASSISTED VAGINAL HYSTERECTOMY WITH bilateral SALPINGECTOMY, right oophorectomy. laporotic repair of incidental cystotomy.;  Surgeon: Louretta Shorten, MD;  Location: [facility];  Service: Gynecology;  Laterality: Bilateral;    Family History  Problem Relation Age of Onset  . Depression Mother   . Hypertension  Mother   . Diabetes Mother   . Obesity Mother   . Thyroid disease Mother   . Cancer Father     ? lung CA  . Kidney disease Father   . Hydrocephalus Sister   . Colon cancer Neg Hx     Allergies  Allergen Reactions  . Azithromycin Nausea And Vomiting  diarrhea    Current Outpatient Prescriptions on File Prior to Visit  Medication Sig Dispense Refill  . ALPRAZolam (XANAX) 0.5 MG tablet Take 0.5 mg by mouth 2 (two) times daily as needed for anxiety.     Marland Kitchen aspirin 81 MG tablet Take 81 mg by mouth daily.    Marland Kitchen b complex vitamins capsule Take 1 capsule by mouth daily.    . Cholecalciferol (VITAMIN D) 2000 units CAPS Take 2,000 Units by mouth daily.     Marland Kitchen FLUoxetine (PROZAC) 40 MG capsule Take 40 mg by mouth daily.     . hydrochlorothiazide (MICROZIDE) 12.5 MG capsule Take 1 capsule (12.5 mg total) by mouth daily. 90 capsule 1  . zolpidem (AMBIEN) 10 MG tablet Take 10 mg by mouth at bedtime as needed for sleep.   5  . [DISCONTINUED] pantoprazole (PROTONIX) 40 MG tablet Take 40 mg by mouth daily before breakfast.       No current facility-administered medications on file prior to visit.    BP 114/72 mmHg  Pulse 86  Temp(Src) 98.2 F (36.8 C) (Oral)  Ht 5\' 2"  (1.575 m)  Wt 184 lb (83.462 kg)  BMI 33.65 kg/m2  SpO2 98%  LMP 02/11/2016 (Exact Date)       Objective:   Physical Exam  .General Mental Status- Alert. General Appearance- Not in acute distress.   Skin General: Color- Normal Color. Moisture- Normal Moisture. Particular inspection of back was normal.  Neck Carotid Arteries- Normal color. Moisture- Normal Moisture. No carotid bruits. No JVD.  Chest and Lung Exam Auscultation: Breath Sounds:-Normal. CTA.  Cardiovascular Auscultation:Rythm- Regular. Murmurs & Other Heart Sounds:Auscultation of the heart reveals- No Murmurs.  Abdomen Inspection:-Inspeection Normal. Palpation/Percussion:Note:No mass. Palpation and Percussion of the abdomen reveal- Non  Tender, Non Distended + BS, no rebound or guarding.    Neurologic Cranial Nerve exam:- CN III-XII intact(No nystagmus), symmetric smile. Drift Test:- No drift. Romberg Exam:- Negative.  Heal to Toe Gait exam:-Normal. Finger to Nose:- Normal/Intact Strength:- 5/5 equal and symmetric strength both upper and lower extremities.      Assessment & Plan:

## 2016-03-08 NOTE — Assessment & Plan Note (Signed)
Will get future fating cbc,cmp, tsh, lipid panel and ua.

## 2016-03-08 NOTE — Addendum Note (Signed)
Addended by: Tasia Catchings on: 03/08/2016 10:23 AM   Modules accepted: Orders

## 2016-03-08 NOTE — Progress Notes (Signed)
Pre visit review using our clinic review tool, if applicable. No additional management support is needed unless otherwise documented below in the visit note. 

## 2016-03-09 LAB — URINE CULTURE

## 2016-03-09 MED ORDER — CIPROFLOXACIN HCL 500 MG PO TABS: 500.0000 mg | ORAL_TABLET | Freq: Two times a day (BID) | ORAL | Status: DC

## 2016-03-09 NOTE — Telephone Encounter (Signed)
rx short course cipro sent to her pharmacy.

## 2016-03-09 NOTE — Addendum Note (Signed)
Addended by: Tasia Catchings on: 03/09/2016 11:13 AM   Modules accepted: Orders

## 2016-03-10 ENCOUNTER — Other Ambulatory Visit (INDEPENDENT_AMBULATORY_CARE_PROVIDER_SITE_OTHER): Payer: 59

## 2016-03-10 ENCOUNTER — Encounter: Payer: Self-pay | Admitting: Medical

## 2016-03-10 DIAGNOSIS — Z0189 Encounter for other specified special examinations: Secondary | ICD-10-CM | POA: Diagnosis not present

## 2016-03-10 DIAGNOSIS — Z1321 Encounter for screening for nutritional disorder: Secondary | ICD-10-CM | POA: Diagnosis not present

## 2016-03-10 DIAGNOSIS — Z0000 Encounter for general adult medical examination without abnormal findings: Secondary | ICD-10-CM

## 2016-03-10 DIAGNOSIS — R739 Hyperglycemia, unspecified: Secondary | ICD-10-CM | POA: Diagnosis not present

## 2016-03-10 LAB — CBC WITH DIFFERENTIAL/PLATELET
BASOS PCT: 0.6 % (ref 0.0–3.0)
Basophils Absolute: 0.1 10*3/uL (ref 0.0–0.1)
EOS ABS: 0.4 10*3/uL (ref 0.0–0.7)
EOS PCT: 3.6 % (ref 0.0–5.0)
HCT: 33.8 % — ABNORMAL LOW (ref 36.0–46.0)
Hemoglobin: 11.2 g/dL — ABNORMAL LOW (ref 12.0–15.0)
LYMPHS PCT: 25.2 % (ref 12.0–46.0)
Lymphs Abs: 2.7 10*3/uL (ref 0.7–4.0)
MCHC: 33 g/dL (ref 30.0–36.0)
MCV: 91.1 fl (ref 78.0–100.0)
Monocytes Absolute: 0.6 10*3/uL (ref 0.1–1.0)
Monocytes Relative: 5.2 % (ref 3.0–12.0)
NEUTROS ABS: 7.1 10*3/uL (ref 1.4–7.7)
Neutrophils Relative %: 65.4 % (ref 43.0–77.0)
PLATELETS: 486 10*3/uL — AB (ref 150.0–400.0)
RBC: 3.71 Mil/uL — ABNORMAL LOW (ref 3.87–5.11)
RDW: 14.4 % (ref 11.5–15.5)
WBC: 10.8 10*3/uL — ABNORMAL HIGH (ref 4.0–10.5)

## 2016-03-10 LAB — COMPREHENSIVE METABOLIC PANEL
ALBUMIN: 3.8 g/dL (ref 3.5–5.2)
ALT: 27 U/L (ref 0–35)
AST: 19 U/L (ref 0–37)
Alkaline Phosphatase: 60 U/L (ref 39–117)
BUN: 9 mg/dL (ref 6–23)
CALCIUM: 9.4 mg/dL (ref 8.4–10.5)
CHLORIDE: 104 meq/L (ref 96–112)
CO2: 23 meq/L (ref 19–32)
CREATININE: 0.69 mg/dL (ref 0.40–1.20)
GFR: 118.63 mL/min (ref >60.00)
Glucose, Bld: 106 mg/dL — ABNORMAL HIGH (ref 70–99)
POTASSIUM: 3.6 meq/L (ref 3.5–5.1)
Sodium: 138 mEq/L (ref 135–145)
Total Bilirubin: 0.4 mg/dL (ref 0.2–1.2)
Total Protein: 7.1 g/dL (ref 6.0–8.3)

## 2016-03-10 LAB — LIPID PANEL
CHOLESTEROL: 214 mg/dL — AB (ref 0–200)
HDL: 44.4 mg/dL (ref >39.00)
LDL CALC: 141 mg/dL — AB (ref 0–99)
NonHDL: 169.56
TRIGLYCERIDES: 144 mg/dL (ref 0.0–149.0)
Total CHOL/HDL Ratio: 5
VLDL: 28.8 mg/dL (ref 0.0–40.0)

## 2016-03-10 LAB — HEMOGLOBIN A1C: Hgb A1c MFr Bld: 5.2 % (ref 4.6–6.5)

## 2016-03-10 LAB — TSH: TSH: 2.2 u[IU]/mL (ref 0.35–4.50)

## 2016-03-10 LAB — VITAMIN D 25 HYDROXY (VIT D DEFICIENCY, FRACTURES): VITD: 21.11 ng/mL — ABNORMAL LOW (ref 30.00–100.00)

## 2016-03-10 MED ORDER — VITAMIN D (ERGOCALCIFEROL) 1.25 MG (50000 UNIT) PO CAPS: 50000.0000 [IU] | ORAL_CAPSULE | ORAL | Status: DC

## 2016-03-10 NOTE — Telephone Encounter (Signed)
Sent vitamin d in.

## 2016-03-11 NOTE — Progress Notes (Signed)
Quick Note:  Pt has seen results on MyChart and message also sent for patient to call back if any questions. ______ 

## 2016-03-11 NOTE — Progress Notes (Signed)
Quick Note:  Pt has seen results on MyChart and message also sent for patient to call back if any questions.//hm ______

## 2016-03-29 ENCOUNTER — Ambulatory Visit (INDEPENDENT_AMBULATORY_CARE_PROVIDER_SITE_OTHER): Payer: 59 | Admitting: Family Medicine

## 2016-03-29 ENCOUNTER — Encounter: Payer: Self-pay | Admitting: Family Medicine

## 2016-03-29 VITALS — BP 122/88 | HR 85 | Temp 97.8°F | Ht 63.0 in | Wt 187.6 lb

## 2016-03-29 DIAGNOSIS — R0981 Nasal congestion: Secondary | ICD-10-CM

## 2016-03-29 DIAGNOSIS — R0982 Postnasal drip: Secondary | ICD-10-CM | POA: Diagnosis not present

## 2016-03-29 LAB — POCT INFLUENZA A/B
INFLUENZA A, POC: NEGATIVE
INFLUENZA B, POC: NEGATIVE

## 2016-03-29 MED ORDER — IPRATROPIUM BROMIDE 0.03 % NA SOLN: 2.0000 | Freq: Four times a day (QID) | NASAL | Status: DC

## 2016-03-29 MED ORDER — PREDNISONE 20 MG PO TABS: ORAL_TABLET | ORAL | Status: DC

## 2016-03-29 NOTE — Progress Notes (Signed)
[place] at [facility] [address], [place], [place] [address] [phone]  Date:  03/29/2016   Name:  Ann Frederick   DOB:  02/02/1971   MRN:  [id number]  PCP:  Mackie Pai, PA-C    Chief Complaint: Sinus Problem   History of Present Illness:  Ann Frederick is a 45 y.o. very pleasant female patient who presents with the following:  Here today with illnes- she notes "itchy,watery eyes, one side of my nose if running like a faucet and then clogging up.  Mucus has been yellow/ greenish."  She notes a HA, some sneezing.    She has noted low- grade temp to maybe 99.  Last temp yesterday Some body aches, fatigue.   She has been nauseated but no vomiting or diarrhea  She has used benadryl, tylenol decongestant, some other OTC products.   She has been ill for about 5 days now.   She just got through a hysterectomy but recovered well from this She does not generally have allergies  She has dx of "combined immunodeficiency" on her problem list but states that she does not have any knowledge of this condition, has never been treated for this or seen hematology. We think this is an error and will remove this code  Recent labs looked ok, A1c normal   Patient Active Problem List   Diagnosis Date Noted  . Wellness examination 03/08/2016  . S/P laparoscopic assisted vaginal hysterectomy (LAVH) 02/23/2016  . Pink eye 06/19/2015  . Pain of right thumb 06/19/2015  . Sinusitis, acute frontal 02/05/2015  . Headache around the eyes 02/05/2015  . Back pain 11/01/2014  . Substernal chest pain 12/19/2013  . URI (upper respiratory infection) 12/19/2013  . Routine general medical examination at a health care facility 10/30/2013  . Vesicular rash 09/07/2013  . Sinusitis 02/02/2013  . Viral pharyngitis 03/03/2012  . Flank pain 08/13/2011  . Inguinal adenopathy 08/13/2011  . Dysphagia 04/20/2011  . GERD 01/29/2011  . FATIGUE 01/29/2011   . 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 LEG PAIN, BILATERAL 06/06/2010  . HIP PAIN, LEFT 04/01/2010  . MASS, LOCALIZED, SUPERFICIAL 03/05/2009  . ACNE VULGARIS 07/11/2007  . MURMUR 07/11/2007    Past Medical History  Diagnosis Date  . Acne   . Heart murmur     ? heart murmur in past  . GERD (gastroesophageal reflux disease)     worse while pregnant  . Diabetes mellitus     gest this pregnancy  . Hypertension   . Depression   . Anxiety   . Headache     Migraines  . Anemia     history of  . History of blood transfusion 2013    Past Surgical History  Procedure Laterality Date  . Cesarean section      x 2  . Esophagogastroduodenoscopy  normal     7/12  . Cesarean section  09/29/2012    Procedure: CESAREAN SECTION;  Surgeon: Luz Lex, MD;  Location: [facility];  Service: Obstetrics;  Laterality: N/A;  Repeat edc 10/12/12/REQUEST;Chassity,Dee,Colleen  . Colonoscopy    . Mouth surgery    . Laparoscopic vaginal hysterectomy with salpingectomy Bilateral 02/23/2016    Procedure: LAPAROSCOPIC ASSISTED VAGINAL HYSTERECTOMY WITH bilateral SALPINGECTOMY, right oophorectomy. laporotic repair of incidental cystotomy.;  Surgeon: Louretta Shorten, MD;  Location: [facility];  Service: Gynecology;  Laterality: Bilateral;    Social History  Substance Use Topics  . Smoking status: Never Smoker   . Smokeless tobacco: Never Used  . Alcohol Use: Yes     Comment: 1 glass of wine per week prior to preg    Family History  Problem Relation Age of Onset  . Depression Mother   . Hypertension Mother   . Diabetes Mother   . Obesity Mother   . Thyroid disease Mother   . Cancer Father     ? lung CA  . Kidney disease Father   . Hydrocephalus Sister   . Colon cancer Neg Hx     Allergies  Allergen Reactions  . Azithromycin Nausea And Vomiting    diarrhea    Medication list has been reviewed and updated.  Current Outpatient Prescriptions on File Prior to  Visit  Medication Sig Dispense Refill  . ALPRAZolam (XANAX) 0.5 MG tablet Take 0.5 mg by mouth 2 (two) times daily as needed for anxiety.     Marland Kitchen aspirin 81 MG tablet Take 81 mg by mouth daily.    Marland Kitchen b complex vitamins capsule Take 1 capsule by mouth daily.    . Cholecalciferol (VITAMIN D) 2000 units CAPS Take 2,000 Units by mouth daily.     Marland Kitchen FLUoxetine (PROZAC) 40 MG capsule Take 40 mg by mouth daily.     . hydrochlorothiazide (MICROZIDE) 12.5 MG capsule Take 1 capsule (12.5 mg total) by mouth daily. 90 capsule 1  . Vitamin D, Ergocalciferol, (DRISDOL) 50000 units CAPS capsule Take 1 capsule (50,000 Units total) by mouth every 7 (seven) days. 6 capsule 0  . zolpidem (AMBIEN) 10 MG tablet Take 10 mg by mouth at bedtime as needed for sleep.   5  . [DISCONTINUED] pantoprazole (PROTONIX) 40 MG tablet Take 40 mg by mouth daily before breakfast.       No current facility-administered medications on file prior to visit.    Review of Systems:  As per HPI- otherwise negative.   Physical Examination: Filed Vitals:   03/29/16 0918  BP: 122/88  Pulse: 85  Temp: 97.8 F (36.6 C)   Filed Vitals:   03/29/16 0918  Height: 5\' 3"  (1.6 m)  Weight: 187 lb 9.6 oz (85.095 kg)   Body mass index is 33.24 kg/(m^2). Ideal Body Weight: Weight in (lb) to have BMI = 25: 140.8  GEN: WDWN, NAD, Non-toxic, A & O x 3, overweight, looks well HEENT: Atraumatic, Normocephalic. Neck supple. No masses, right sided posterior cervical node which she reports has been present for many years.  Bilateral TM wnl, oropharynx normal.  PEERL,EOMI.   Ears and Nose: No external deformity.   CV: RRR, No M/G/R. No JVD. No thrill. No extra heart sounds. PULM: CTA B, no wheezes, crackles, rhonchi. No retractions. No resp. distress. No accessory muscle use. EXTR: No c/c/e NEURO Normal gait.  PSYCH: Normally interactive. Conversant. Not depressed or anxious appearing.  Calm demeanor.   Results for orders placed or performed in  visit on 03/29/16  POCT Influenza A/B  Result Value Ref Range   Influenza A, POC Negative Negative   Influenza B, POC Negative Negative    Assessment and Plan: Sinus congestion - Plan: predniSONE (DELTASONE) 20 MG tablet, POCT Influenza A/B  PND (post-nasal drip) - Plan: ipratropium (ATROVENT) 0.03 % nasal spray  Here today with sx of sinus congestion, itchy eyes and nose, cough, malaise.,  Suspect that allergies are to blame. She is miserable so will try prednisone for a few days, also atrovent nasal.  She will let me know if not improving in the next 2 days- Sooner if worse.     Signed Lamar Blinks, MD

## 2016-03-29 NOTE — Patient Instructions (Signed)
We are going to treat you for allergies vs viral illness with prednisone and atrovent nasal for your post- nasal drainage Ok to continue other OTC meds as needed but avoid NSAIDs while you are on the prednisone Please let me know if you are not feeling improved within 48 hours- Sooner if worse.

## 2016-03-31 ENCOUNTER — Encounter: Payer: Self-pay | Admitting: Family Medicine

## 2016-03-31 DIAGNOSIS — J0110 Acute frontal sinusitis, unspecified: Secondary | ICD-10-CM

## 2016-03-31 MED ORDER — AMOXICILLIN 500 MG PO CAPS: 1000.0000 mg | ORAL_CAPSULE | Freq: Two times a day (BID) | ORAL | Status: DC

## 2016-04-16 ENCOUNTER — Emergency Department (HOSPITAL_BASED_OUTPATIENT_CLINIC_OR_DEPARTMENT_OTHER)
Admission: EM | Admit: 2016-04-16 | Discharge: 2016-04-17 | Disposition: A | Discharge status: Home / Self Care | Payer: 59 | Attending: Emergency Medicine | Admitting: Emergency Medicine

## 2016-04-16 ENCOUNTER — Encounter (HOSPITAL_BASED_OUTPATIENT_CLINIC_OR_DEPARTMENT_OTHER): Payer: Self-pay | Admitting: Emergency Medicine

## 2016-04-16 DIAGNOSIS — Z79899 Other long term (current) drug therapy: Secondary | ICD-10-CM | POA: Diagnosis not present

## 2016-04-16 DIAGNOSIS — J111 Influenza due to unidentified influenza virus with other respiratory manifestations: Secondary | ICD-10-CM | POA: Diagnosis not present

## 2016-04-16 DIAGNOSIS — R011 Cardiac murmur, unspecified: Secondary | ICD-10-CM | POA: Insufficient documentation

## 2016-04-16 DIAGNOSIS — Z7982 Long term (current) use of aspirin: Secondary | ICD-10-CM | POA: Insufficient documentation

## 2016-04-16 DIAGNOSIS — G43909 Migraine, unspecified, not intractable, without status migrainosus: Secondary | ICD-10-CM | POA: Diagnosis not present

## 2016-04-16 DIAGNOSIS — F329 Major depressive disorder, single episode, unspecified: Secondary | ICD-10-CM | POA: Insufficient documentation

## 2016-04-16 DIAGNOSIS — R000 Tachycardia, unspecified: Secondary | ICD-10-CM | POA: Insufficient documentation

## 2016-04-16 DIAGNOSIS — R05 Cough: Secondary | ICD-10-CM | POA: Diagnosis present

## 2016-04-16 DIAGNOSIS — Z862 Personal history of diseases of the blood and blood-forming organs and certain disorders involving the immune mechanism: Secondary | ICD-10-CM | POA: Diagnosis not present

## 2016-04-16 DIAGNOSIS — F419 Anxiety disorder, unspecified: Secondary | ICD-10-CM | POA: Insufficient documentation

## 2016-04-16 DIAGNOSIS — Z8719 Personal history of other diseases of the digestive system: Secondary | ICD-10-CM | POA: Insufficient documentation

## 2016-04-16 DIAGNOSIS — E119 Type 2 diabetes mellitus without complications: Secondary | ICD-10-CM | POA: Diagnosis not present

## 2016-04-16 DIAGNOSIS — I10 Essential (primary) hypertension: Secondary | ICD-10-CM | POA: Insufficient documentation

## 2016-04-16 DIAGNOSIS — Z792 Long term (current) use of antibiotics: Secondary | ICD-10-CM | POA: Insufficient documentation

## 2016-04-16 DIAGNOSIS — R69 Illness, unspecified: Secondary | ICD-10-CM

## 2016-04-16 MED ORDER — SODIUM CHLORIDE 0.9 % IV BOLUS (SEPSIS)
500.0000 mL | Freq: Once | INTRAVENOUS | Status: AC
  Administered 2016-04-17: 500 mL via INTRAVENOUS

## 2016-04-16 MED ORDER — ACETAMINOPHEN 500 MG PO TABS
1000.0000 mg | ORAL_TABLET | Freq: Once | ORAL | Status: AC
  Administered 2016-04-16: 1000 mg via ORAL
  Filled 2016-04-16: qty 2

## 2016-04-16 NOTE — ED Notes (Signed)
t reports symptoms onset Tuesday past with 101 fever slight cough generalized malaise

## 2016-04-17 ENCOUNTER — Emergency Department (HOSPITAL_BASED_OUTPATIENT_CLINIC_OR_DEPARTMENT_OTHER): Payer: 59

## 2016-04-17 LAB — CBC WITH DIFFERENTIAL/PLATELET
BASOS ABS: 0 10*3/uL (ref 0.0–0.1)
Basophils Relative: 0 %
Eosinophils Absolute: 0.4 10*3/uL (ref 0.0–0.7)
Eosinophils Relative: 2 %
HEMATOCRIT: 35.6 % — AB (ref 36.0–46.0)
Hemoglobin: 11.7 g/dL — ABNORMAL LOW (ref 12.0–15.0)
LYMPHS ABS: 0.4 10*3/uL — AB (ref 0.7–4.0)
LYMPHS PCT: 2 %
MCH: 29.3 pg (ref 26.0–34.0)
MCHC: 32.9 g/dL (ref 30.0–36.0)
MCV: 89.2 fL (ref 78.0–100.0)
MONO ABS: 0.6 10*3/uL (ref 0.1–1.0)
Monocytes Relative: 3 %
NEUTROS ABS: 16.6 10*3/uL — AB (ref 1.7–7.7)
Neutrophils Relative %: 93 %
Platelets: 352 10*3/uL (ref 150–400)
RBC: 3.99 MIL/uL (ref 3.87–5.11)
RDW: 14.3 % (ref 11.5–15.5)
WBC: 18 10*3/uL — AB (ref 4.0–10.5)

## 2016-04-17 LAB — URINE MICROSCOPIC-ADD ON: RBC / HPF: NONE SEEN RBC/hpf (ref 0–5)

## 2016-04-17 LAB — COMPREHENSIVE METABOLIC PANEL
ALBUMIN: 3.5 g/dL (ref 3.5–5.0)
ALK PHOS: 61 U/L (ref 38–126)
ALT: 33 U/L (ref 14–54)
ANION GAP: 7 (ref 5–15)
AST: 37 U/L (ref 15–41)
BILIRUBIN TOTAL: 1 mg/dL (ref 0.3–1.2)
BUN: 8 mg/dL (ref 6–20)
CALCIUM: 8.3 mg/dL — AB (ref 8.9–10.3)
CO2: 25 mmol/L (ref 22–32)
Chloride: 105 mmol/L (ref 101–111)
Creatinine, Ser: 0.73 mg/dL (ref 0.44–1.00)
GFR calc non Af Amer: >60 mL/min (ref >60)
GLUCOSE: 126 mg/dL — AB (ref 65–99)
POTASSIUM: 3 mmol/L — AB (ref 3.5–5.1)
SODIUM: 137 mmol/L (ref 135–145)
TOTAL PROTEIN: 6.8 g/dL (ref 6.5–8.1)

## 2016-04-17 LAB — URINALYSIS, ROUTINE W REFLEX MICROSCOPIC
Bilirubin Urine: NEGATIVE
GLUCOSE, UA: NEGATIVE mg/dL
Hgb urine dipstick: NEGATIVE
KETONES UR: NEGATIVE mg/dL
NITRITE: NEGATIVE
PROTEIN: NEGATIVE mg/dL
Specific Gravity, Urine: 1.025 (ref 1.005–1.030)
pH: 7.5 (ref 5.0–8.0)

## 2016-04-17 LAB — PREGNANCY, URINE: Preg Test, Ur: NEGATIVE

## 2016-04-17 MED ORDER — CEPHALEXIN 500 MG PO CAPS: 500.0000 mg | ORAL_CAPSULE | Freq: Two times a day (BID) | ORAL | Status: DC

## 2016-04-17 MED ORDER — POTASSIUM CHLORIDE 10 MEQ/100ML IV SOLN
10.0000 meq | INTRAVENOUS | Status: AC
  Administered 2016-04-17 (×2): 10 meq via INTRAVENOUS
  Filled 2016-04-17 (×2): qty 100

## 2016-04-17 MED ORDER — ONDANSETRON 4 MG PO TBDP: 4.0000 mg | ORAL_TABLET | Freq: Three times a day (TID) | ORAL | Status: DC | PRN

## 2016-04-17 MED ORDER — POTASSIUM CHLORIDE CRYS ER 20 MEQ PO TBCR
60.0000 meq | EXTENDED_RELEASE_TABLET | Freq: Once | ORAL | Status: AC
  Administered 2016-04-17: 60 meq via ORAL
  Filled 2016-04-17: qty 3

## 2016-04-17 NOTE — ED Provider Notes (Signed)
CSN: [id number]     Arrival date & time 04/16/16  2314 History   First MD Initiated Contact with Patient 04/17/16 0009     Chief Complaint  Patient presents with  . Influenza     (Consider location/radiation/quality/duration/timing/severity/associated sxs/prior Treatment) HPI Comments: 45 year old female with past medical history including GERD, depression/anxiety, gestational diabetes who presents with fever. Patient states that approximately 3 days ago, she began feeling unwell and had 12 hours of vomiting and diarrhea. These symptoms improved but then she began having generalized malaise and body aches. She has had a mild cough and mild headache in addition to seasonal allergy symptoms. No rashes or sick contacts. No recent travel. She took Motrin earlier today for her symptoms. She began feeling worse tonight including lightheadedness which is why she called EMS. She denies any chest pain or shortness of breath. No sudden onset of severe headache. No neck stiffness.  Patient is a 45 y.o. female presenting with flu symptoms. The history is provided by the patient.  Influenza   Past Medical History  Diagnosis Date  . Acne   . Heart murmur     ? heart murmur in past  . GERD (gastroesophageal reflux disease)     worse while pregnant  . Diabetes mellitus     gest this pregnancy  . Hypertension   . Depression   . Anxiety   . Headache     Migraines  . Anemia     history of  . History of blood transfusion 2013   Past Surgical History  Procedure Laterality Date  . Cesarean section      x 2  . Esophagogastroduodenoscopy  normal     7/12  . Cesarean section  09/29/2012    Procedure: CESAREAN SECTION;  Surgeon: Luz Lex, MD;  Location: [facility];  Service: Obstetrics;  Laterality: N/A;  Repeat edc 10/12/12/REQUEST;Chassity,Dee,Colleen  . Colonoscopy    . Mouth surgery    . Laparoscopic vaginal hysterectomy with salpingectomy Bilateral 02/23/2016    Procedure: LAPAROSCOPIC  ASSISTED VAGINAL HYSTERECTOMY WITH bilateral SALPINGECTOMY, right oophorectomy. laporotic repair of incidental cystotomy.;  Surgeon: Louretta Shorten, MD;  Location: [facility];  Service: Gynecology;  Laterality: Bilateral;   Family History  Problem Relation Age of Onset  . Depression Mother   . Hypertension Mother   . Diabetes Mother   . Obesity Mother   . Thyroid disease Mother   . Cancer Father     ? lung CA  . Kidney disease Father   . Hydrocephalus Sister   . Colon cancer Neg Hx    Social History  Substance Use Topics  . Smoking status: Never Smoker   . Smokeless tobacco: Never Used  . Alcohol Use: Yes     Comment: 1 glass of wine per week prior to preg   OB History    Gravida Para Term Preterm AB TAB SAB Ectopic Multiple Living   7 3 3  0 4 1 3  0 0 3     Review of Systems    Allergies  Azithromycin  Home Medications   Prior to Admission medications   Medication Sig Start Date End Date Taking? Authorizing Provider  ALPRAZolam Duanne Moron) 0.5 MG tablet Take 0.5 mg by mouth 2 (two) times daily as needed for anxiety.     Historical Provider, MD  amoxicillin (AMOXIL) 500 MG capsule Take 2 capsules (1,000 mg total) by mouth 2 (two) times daily. 03/31/16   Darreld Mclean, MD  aspirin 81 MG tablet  Take 81 mg by mouth daily.    Historical Provider, MD  b complex vitamins capsule Take 1 capsule by mouth daily.    Historical Provider, MD  cephALEXin (KEFLEX) 500 MG capsule Take 1 capsule (500 mg total) by mouth 2 (two) times daily. 04/17/16   Sharlett Iles, MD  Cholecalciferol (VITAMIN D) 2000 units CAPS Take 2,000 Units by mouth daily.     Historical Provider, MD  FLUoxetine (PROZAC) 40 MG capsule Take 40 mg by mouth daily.     Historical Provider, MD  hydrochlorothiazide (MICROZIDE) 12.5 MG capsule Take 1 capsule (12.5 mg total) by mouth daily. 03/08/16   [name] Saguier, PA-C  ipratropium (ATROVENT) 0.03 % nasal spray Place 2 sprays into the nose 4 (four) times daily. Use as needed  03/29/16   Darreld Mclean, MD  ondansetron (ZOFRAN ODT) 4 MG disintegrating tablet Take 1 tablet (4 mg total) by mouth every 8 (eight) hours as needed for nausea or vomiting. 04/17/16   Sharlett Iles, MD  predniSONE (DELTASONE) 20 MG tablet Take 2 pills daily for 3 days, then 1 pill daily for 3 days if needed 03/29/16   Darreld Mclean, MD  Vitamin D, Ergocalciferol, (DRISDOL) 50000 units CAPS capsule Take 1 capsule (50,000 Units total) by mouth every 7 (seven) days. 03/10/16   [name] Saguier, PA-C  zolpidem (AMBIEN) 10 MG tablet Take 10 mg by mouth at bedtime as needed for sleep.  03/24/15   Historical Provider, MD   BP 113/68 mmHg  Pulse 100  Temp(Src) 98.9 F (37.2 C) (Oral)  Resp 23  Ht 5\' 2"  (1.575 m)  Wt 180 lb (81.647 kg)  BMI 32.91 kg/m2  SpO2 98%  LMP 02/11/2016 (Exact Date) Physical Exam  Constitutional: She is oriented to person, place, and time. She appears well-developed and well-nourished. No distress.  HENT:  Head: Normocephalic and atraumatic.  Mildly dry mucous membranes  Eyes: Conjunctivae are normal. Pupils are equal, round, and reactive to light.  Neck: Neck supple.  Cardiovascular: Regular rhythm and normal heart sounds.  Tachycardia present.   No murmur heard. Pulmonary/Chest: Effort normal and breath sounds normal.  Abdominal: Soft. Bowel sounds are normal. She exhibits no distension. There is no tenderness.  Musculoskeletal: She exhibits no edema.  Neurological: She is alert and oriented to person, place, and time.  Fluent speech  Skin: Skin is warm and dry. No rash noted. No pallor.  Psychiatric: She has a normal mood and affect. Judgment normal.  Nursing note and vitals reviewed.   ED Course  Procedures (including critical care time) Labs Review Labs Reviewed  COMPREHENSIVE METABOLIC PANEL - Abnormal; Notable for the following:    Potassium 3.0 (*)    Glucose, Bld 126 (*)    Calcium 8.3 (*)    All other components within normal limits   URINALYSIS, ROUTINE W REFLEX MICROSCOPIC (NOT AT [facility]) - Abnormal; Notable for the following:    Leukocytes, UA SMALL (*)    All other components within normal limits  CBC WITH DIFFERENTIAL/PLATELET - Abnormal; Notable for the following:    WBC 18.0 (*)    Hemoglobin 11.7 (*)    HCT 35.6 (*)    Neutro Abs 16.6 (*)    Lymphs Abs 0.4 (*)    All other components within normal limits  URINE MICROSCOPIC-ADD ON - Abnormal; Notable for the following:    Squamous Epithelial / LPF 0-5 (*)    Bacteria, UA RARE (*)    All other components  within normal limits  URINE CULTURE  PREGNANCY, URINE    Imaging Review Dg Chest 2 View  04/17/2016  CLINICAL DATA:  Cough and fever for 3 days.  Fatigue. EXAM: CHEST  2 VIEW COMPARISON:  None. FINDINGS: The cardiomediastinal contours are normal. The lungs are clear. Pulmonary vasculature is normal. No consolidation, pleural effusion, or pneumothorax. No acute osseous abnormalities are seen. IMPRESSION: No acute pulmonary process. Electronically Signed   By: Jeb Levering M.D.   On: 04/17/2016 01:58   I have personally reviewed and evaluated these lab results as part of my medical decision-making.   EKG Interpretation   Date/Time:  Saturday April 17 2016 01:01:11 EDT Ventricular Rate:  113 PR Interval:  143 QRS Duration: 80 QT Interval:  332 QTC Calculation: 455 R Axis:   43 Text Interpretation:  Sinus tachycardia Baseline wander in lead(s) II V5  tachycardia new from previous Confirmed by Leidi Astle MD, Elany Felix [id number]) on  04/17/2016 1:17:28 AM     Medications  sodium chloride 0.9 % bolus 500 mL (0 mLs Intravenous Stopped 04/17/16 0058)  acetaminophen (TYLENOL) tablet 1,000 mg (1,000 mg Oral Given 04/16/16 2351)  potassium chloride 10 mEq in 100 mL IVPB (0 mEq Intravenous Stopped 04/17/16 0436)  potassium chloride SA (K-DUR,KLOR-CON) CR tablet 60 mEq (60 mEq Oral Given 04/17/16 0101)    MDM   Final diagnoses:  Influenza-like illness   Patient  presents with 3 days of generalized illness which started with vomiting and diarrhea and later led to malaise, lightheadedness, body aches, and mild cough. Patient uncomfortable but nontoxic on arrival. She was febrile to 102.5, mild tachycardia at 118, normal BP and breathing completely on room air. No abnormal lung sounds. No neck stiffness. Gave Tylenol and an IV fluid bolus. EKG shows sinus tachycardia but no ischemic changes. Chest x-ray negative acute. Labs show potassium 3.0, normal creatinine, WBC 18,000. UA shows small leukocytes, 6-30 wbc's, rare bacteria. I extensively questioned on urinary symptoms and the patient denied dysuria or frequency. She stated she may have some very mild urinary retention. Patient was hesitant to start antibiotic medication to treat UTI. Sent culture and I discussed watchful waiting approach; provided with perception for Keflex to use if she develops any further urinary symptoms. On reexamination, the patient's temperature was normal, tachycardia had improved, and she was tolerating liquids. She stated that she felt much better. Her symptoms are consistent with an influenza-like illness and given no significant comorbidities and 3 day duration of her illness, I do not feel she needs Tamiflu. I discussed supportive care and provided with Zofran to use as needed for nausea. Return precautions extensively reviewed and patient voice understanding. Patient ambulatory prior to discharge. Patient discharged in satisfactory condition.  Sharlett Iles, MD 04/17/16 [phone]

## 2016-04-17 NOTE — ED Notes (Signed)
Patient is alert and oriented x3.  She was given DC instructions and follow up visit instructions.  Patient gave verbal understanding. She was DC ambulatory under her own power to home.  V/S stable.  He was not showing any signs of distress on DC 

## 2016-04-17 NOTE — Discharge Instructions (Signed)
Please start taking keflex as directed if you have any burning with urination, increased frequency of urination, lower abdominal pain, or bloody/foul-smelling urine.  Influenza, Adult Influenza ("the flu") is a viral infection of the respiratory tract. It occurs more often in winter months because people spend more time in close contact with one another. Influenza can make you feel very sick. Influenza easily spreads from person to person (contagious). CAUSES  Influenza is caused by a virus that infects the respiratory tract. You can catch the virus by breathing in droplets from an infected person's cough or sneeze. You can also catch the virus by touching something that was recently contaminated with the virus and then touching your mouth, nose, or eyes. RISKS AND COMPLICATIONS You may be at risk for a more severe case of influenza if you smoke cigarettes, have diabetes, have chronic heart disease (such as heart failure) or lung disease (such as asthma), or if you have a weakened immune system. Elderly people and pregnant women are also at risk for more serious infections. The most common problem of influenza is a lung infection (pneumonia). Sometimes, this problem can require emergency medical care and may be life threatening. SIGNS AND SYMPTOMS  Symptoms typically last 4 to 10 days and may include:  Fever.  Chills.  Headache, body aches, and muscle aches.  Sore throat.  Chest discomfort and cough.  Poor appetite.  Weakness or feeling tired.  Dizziness.  Nausea or vomiting. DIAGNOSIS  Diagnosis of influenza is often made based on your history and a physical exam. A nose or throat swab test can be done to confirm the diagnosis. TREATMENT  In mild cases, influenza goes away on its own. Treatment is directed at relieving symptoms. For more severe cases, your health care provider may prescribe antiviral medicines to shorten the sickness. Antibiotic medicines are not effective because the  infection is caused by a virus, not by bacteria. HOME CARE INSTRUCTIONS  Take medicines only as directed by your health care provider.  Use a cool mist humidifier to make breathing easier.  Get plenty of rest until your temperature returns to normal. This usually takes 3 to 4 days.  Drink enough fluid to keep your urine clear or pale yellow.  Cover yourmouth and nosewhen coughing or sneezing,and wash your handswellto prevent thevirusfrom spreading.  Stay homefromwork orschool untilthe fever is gonefor at least 50full day. PREVENTION  An annual influenza vaccination (flu shot) is the best way to avoid getting influenza. An annual flu shot is now routinely recommended for all adults in the [place] IF:  You experiencechest pain, yourcough worsens,or you producemore mucus.  Youhave nausea,vomiting, ordiarrhea.  Your fever returns or gets worse. SEEK IMMEDIATE MEDICAL CARE IF:  You havetrouble breathing, you become short of breath,or your skin ornails becomebluish.  You have severe painor stiffnessin the neck.  You develop a sudden headache, or pain in the face or ear.  You have nausea or vomiting that you cannot control. MAKE SURE YOU:   Understand these instructions.  Will watch your condition.  Will get help right away if you are not doing well or get worse.   This information is not intended to replace advice given to you by your health care provider. Make sure you discuss any questions you have with your health care provider.   Document Released: 12/10/2000 Document Revised: 01/03/2015 Document Reviewed: 03/13/2012 Elsevier Interactive Patient Education [employer].

## 2016-04-19 LAB — URINE CULTURE: Special Requests: NORMAL

## 2016-04-21 ENCOUNTER — Ambulatory Visit (HOSPITAL_BASED_OUTPATIENT_CLINIC_OR_DEPARTMENT_OTHER)
Admission: RE | Admit: 2016-04-21 | Discharge: 2016-04-21 | Disposition: A | Discharge status: Home / Self Care | Payer: 59 | Source: Ambulatory Visit | Attending: Medical | Admitting: Medical

## 2016-04-21 ENCOUNTER — Ambulatory Visit (INDEPENDENT_AMBULATORY_CARE_PROVIDER_SITE_OTHER): Payer: 59 | Admitting: Medical

## 2016-04-21 ENCOUNTER — Encounter: Payer: Self-pay | Admitting: Medical

## 2016-04-21 ENCOUNTER — Telehealth: Payer: Self-pay | Admitting: Medical

## 2016-04-21 VITALS — BP 112/72 | HR 104 | Temp 98.2°F | Resp 16 | Ht 63.0 in | Wt 192.0 lb

## 2016-04-21 DIAGNOSIS — R29898 Other symptoms and signs involving the musculoskeletal system: Secondary | ICD-10-CM | POA: Diagnosis not present

## 2016-04-21 DIAGNOSIS — M2550 Pain in unspecified joint: Secondary | ICD-10-CM | POA: Diagnosis not present

## 2016-04-21 DIAGNOSIS — J111 Influenza due to unidentified influenza virus with other respiratory manifestations: Secondary | ICD-10-CM

## 2016-04-21 DIAGNOSIS — R5383 Other fatigue: Secondary | ICD-10-CM | POA: Diagnosis not present

## 2016-04-21 DIAGNOSIS — E876 Hypokalemia: Secondary | ICD-10-CM

## 2016-04-21 DIAGNOSIS — D72829 Elevated white blood cell count, unspecified: Secondary | ICD-10-CM

## 2016-04-21 DIAGNOSIS — G5603 Carpal tunnel syndrome, bilateral upper limbs: Secondary | ICD-10-CM

## 2016-04-21 DIAGNOSIS — Z87898 Personal history of other specified conditions: Secondary | ICD-10-CM | POA: Diagnosis present

## 2016-04-21 LAB — COMPREHENSIVE METABOLIC PANEL
ALBUMIN: 3.4 g/dL — AB (ref 3.5–5.2)
ALK PHOS: 61 U/L (ref 39–117)
ALT: 25 U/L (ref 0–35)
AST: 19 U/L (ref 0–37)
BUN: 7 mg/dL (ref 6–23)
CALCIUM: 8.6 mg/dL (ref 8.4–10.5)
CO2: 27 meq/L (ref 19–32)
Chloride: 102 mEq/L (ref 96–112)
Creatinine, Ser: 0.72 mg/dL (ref 0.40–1.20)
GFR: 112.88 mL/min (ref >60.00)
Glucose, Bld: 147 mg/dL — ABNORMAL HIGH (ref 70–99)
Potassium: 3.3 mEq/L — ABNORMAL LOW (ref 3.5–5.1)
Sodium: 137 mEq/L (ref 135–145)
Total Bilirubin: 0.3 mg/dL (ref 0.2–1.2)
Total Protein: 6.4 g/dL (ref 6.0–8.3)

## 2016-04-21 LAB — CBC WITH DIFFERENTIAL/PLATELET
BASOS ABS: 0 10*3/uL (ref 0.0–0.1)
BASOS PCT: 0.2 % (ref 0.0–3.0)
Eosinophils Absolute: 0.8 10*3/uL — ABNORMAL HIGH (ref 0.0–0.7)
Eosinophils Relative: 7.4 % — ABNORMAL HIGH (ref 0.0–5.0)
HEMATOCRIT: 34.5 % — AB (ref 36.0–46.0)
Hemoglobin: 11.2 g/dL — ABNORMAL LOW (ref 12.0–15.0)
LYMPHS PCT: 12 % (ref 12.0–46.0)
Lymphs Abs: 1.3 10*3/uL (ref 0.7–4.0)
MCHC: 32.5 g/dL (ref 30.0–36.0)
MCV: 87.4 fl (ref 78.0–100.0)
MONOS PCT: 5.2 % (ref 3.0–12.0)
Monocytes Absolute: 0.6 10*3/uL (ref 0.1–1.0)
NEUTROS ABS: 8.4 10*3/uL — AB (ref 1.4–7.7)
Neutrophils Relative %: 75.2 % (ref 43.0–77.0)
PLATELETS: 395 10*3/uL (ref 150.0–400.0)
RBC: 3.95 Mil/uL (ref 3.87–5.11)
RDW: 15.3 % (ref 11.5–15.5)
WBC: 11.2 10*3/uL — ABNORMAL HIGH (ref 4.0–10.5)

## 2016-04-21 LAB — RHEUMATOID FACTOR: Rhuematoid fact SerPl-aCnc: <10 IU/mL (ref <=14)

## 2016-04-21 LAB — C-REACTIVE PROTEIN: CRP: 4.1 mg/dL (ref 0.5–20.0)

## 2016-04-21 LAB — SEDIMENTATION RATE: SED RATE: 16 mm/h (ref 0–22)

## 2016-04-21 NOTE — Progress Notes (Signed)
Pre visit review using our clinic review tool, if applicable. No additional management support is needed unless otherwise documented below in the visit note. 

## 2016-04-21 NOTE — Telephone Encounter (Signed)
I called and left message on pt negative ct. Also advised I would my chart her on lab results.

## 2016-04-21 NOTE — Patient Instructions (Addendum)
You have various symptoms that could be post flu residual symptoms. [phone]. But these are many .    So in light of all these I want to get cbc cmp, sed rate, ra factor, ana, c- reactive protein.  Will try to get ct of head without contrast based on history ha and your concerns.  If any neck stiffness recurrent ha or increased extremity weakness then ED evaluation.  Alleve otc 2 tab twice a day  Return to work note for Monday.   Follow up in 7 days or as needed

## 2016-04-21 NOTE — Progress Notes (Signed)
Subjective:    Patient ID: Ann Frederick, female    DOB: 21-Feb-1971, 45 y.o.   MRN: [id number]  HPI   Pt in for follow up. Pt she states since she had flu like illness.   She feels some bilateral hand pains and feels stiffness. Some pain that radiates up her forearms. At times in the morning has some left shoulder pain. In am both arms and shoulder are hurting. More pain in left side. No chest pain. Pt states in am her left arm feels week. She also reports some tingling to her hands in the am. This has been going on for 4 days.   Pt still has some mild nausea. But did vomit one time yesterday.  Pt did have HA on Saturday.  Pt has occasional pnd with normal culture.  Pt had fever when she got to ED.  Pt feels weakness in both arms and both thighs.   Review of Systems  Constitutional: Negative for fever, chills and fatigue.  HENT: Positive for postnasal drip. Negative for congestion, sinus pressure, sneezing and sore throat.   Respiratory: Negative for cough, chest tightness, shortness of breath and wheezing.   Cardiovascular: Negative for chest pain and palpitations.  Gastrointestinal: Positive for nausea and vomiting. Negative for diarrhea, constipation, blood in stool, abdominal distention, anal bleeding and rectal pain.       Vomiting one time yesterday.  Musculoskeletal: Positive for arthralgias. Negative for back pain.       Of hands and both arms feel weak. Thighs feels weak.  Skin: Negative for rash.  Neurological: Positive for dizziness and weakness. Negative for syncope, facial asymmetry, speech difficulty, light-headedness and headaches.  Hematological: Negative for adenopathy. Does not bruise/bleed easily.  Psychiatric/Behavioral: Negative for behavioral problems and confusion.   Past Medical History  Diagnosis Date  . Acne   . Heart murmur     ? heart murmur in past  . GERD (gastroesophageal reflux disease)     worse while pregnant  . Diabetes mellitus     gest  this pregnancy  . Hypertension   . Depression   . Anxiety   . Headache     Migraines  . Anemia     history of  . History of blood transfusion 2013     Social History   Social History  . Marital Status: Married    Spouse Name: N/A  . Number of Children: 2  . Years of Education: N/A   Occupational History  . Not on file.   Social History Main Topics  . Smoking status: Never Smoker   . Smokeless tobacco: Never Used  . Alcohol Use: Yes     Comment: 1 glass of wine per week prior to preg  . Drug Use: No  . Sexual Activity: Yes   Other Topics Concern  . Not on file   Social History Narrative    Past Surgical History  Procedure Laterality Date  . Cesarean section      x 2  . Esophagogastroduodenoscopy  normal     7/12  . Cesarean section  09/29/2012    Procedure: CESAREAN SECTION;  Surgeon: Luz Lex, MD;  Location: [facility];  Service: Obstetrics;  Laterality: N/A;  Repeat edc 10/12/12/REQUEST;Chassity,Dee,Colleen  . Colonoscopy    . Mouth surgery    . Laparoscopic vaginal hysterectomy with salpingectomy Bilateral 02/23/2016    Procedure: LAPAROSCOPIC ASSISTED VAGINAL HYSTERECTOMY WITH bilateral SALPINGECTOMY, right oophorectomy. laporotic repair of incidental cystotomy.;  Surgeon: Shanon Brow  Corinna Capra, MD;  Location: [facility];  Service: Gynecology;  Laterality: Bilateral;    Family History  Problem Relation Age of Onset  . Depression Mother   . Hypertension Mother   . Diabetes Mother   . Obesity Mother   . Thyroid disease Mother   . Cancer Father     ? lung CA  . Kidney disease Father   . Hydrocephalus Sister   . Colon cancer Neg Hx     Allergies  Allergen Reactions  . Azithromycin Nausea And Vomiting    diarrhea    Current Outpatient Prescriptions on File Prior to Visit  Medication Sig Dispense Refill  . ALPRAZolam (XANAX) 0.5 MG tablet Take 0.5 mg by mouth 2 (two) times daily as needed for anxiety.     Marland Kitchen aspirin 81 MG tablet Take 81 mg by mouth daily.    Marland Kitchen b  complex vitamins capsule Take 1 capsule by mouth daily.    . Cholecalciferol (VITAMIN D) 2000 units CAPS Take 2,000 Units by mouth daily.     Marland Kitchen FLUoxetine (PROZAC) 40 MG capsule Take 40 mg by mouth daily.     . hydrochlorothiazide (MICROZIDE) 12.5 MG capsule Take 1 capsule (12.5 mg total) by mouth daily. 90 capsule 1  . Vitamin D, Ergocalciferol, (DRISDOL) 50000 units CAPS capsule Take 1 capsule (50,000 Units total) by mouth every 7 (seven) days. 6 capsule 0  . zolpidem (AMBIEN) 10 MG tablet Take 10 mg by mouth at bedtime as needed for sleep.   5  . ipratropium (ATROVENT) 0.03 % nasal spray Place 2 sprays into the nose 4 (four) times daily. Use as needed (Patient not taking: Reported on 04/21/2016) 30 mL 6  . [DISCONTINUED] pantoprazole (PROTONIX) 40 MG tablet Take 40 mg by mouth daily before breakfast.       No current facility-administered medications on file prior to visit.    BP 112/72 mmHg  Pulse 104  Temp(Src) 98.2 F (36.8 C) (Oral)  Resp 16  Ht 5\' 3"  (1.6 m)  Wt 192 lb (87.091 kg)  BMI 34.02 kg/m2  SpO2 98%  LMP 02/11/2016 (Exact Date)       Objective:   Physical Exam  General Mental Status- Alert. General Appearance- Not in acute distress.   Skin General: Color- Normal Color. Moisture- Normal Moisture.  Neck Carotid Arteries- Normal color. Moisture- Normal Moisture. No carotid bruits. No JVD.Full range of motion of neck. No neck stiffness.   Chest and Lung Exam Auscultation: Breath Sounds:-Normal.  Cardiovascular Auscultation:Rythm- Regular. Murmurs & Other Heart Sounds:Auscultation of the heart reveals- No Murmurs.  Abdomen Inspection:-Inspeection Normal. Palpation/Percussion:Note:No mass. Palpation and Percussion of the abdomen reveal- Non Tender, Non Distended + BS, no rebound or guarding.    Neurologic Cranial Nerve exam:- CN III-XII intact(No nystagmus), symmetric smile. Drift Test:- No drift.  Finger to Nose:- Normal/Intact Strength:- 4/5 equal  and symmetric strength both upper ext extremities  And 5/5  lower extremities.  Hands- mild swollen. Grip strength 4-5/5 both sides.  Both hands- easily induced phalens signs.       Assessment & Plan:  You have various symptoms that could be post flu residual symptoms.  But these are many .    So in light of all these I want to get cbc cmp, sed rate, ra factor, ana, c- reactive protein.  Will try to get ct of head without contrast based on history ha and your concerns.  If any neck stiffness recurrent ha or increased extremity weakness then  ED evaluation.  Alleve otc 2 tab twice a day  Return to work note for Monday.   Will follow labs and determine further treatment if needed.   Rowyn Mustapha, [name], PA-C

## 2016-04-22 ENCOUNTER — Telehealth: Payer: Self-pay | Admitting: Medical

## 2016-04-22 DIAGNOSIS — E876 Hypokalemia: Secondary | ICD-10-CM

## 2016-04-22 LAB — ANA: Anti Nuclear Antibody(ANA): NEGATIVE

## 2016-04-22 MED ORDER — POTASSIUM CHLORIDE CRYS ER 10 MEQ PO TBCR: 10.0000 meq | EXTENDED_RELEASE_TABLET | Freq: Once | ORAL | Status: DC

## 2016-04-22 NOTE — Progress Notes (Signed)
Quick Note:  Pt has seen results on MyChart and message also sent for patient to call back if any questions. ______ 

## 2016-04-22 NOTE — Telephone Encounter (Signed)
Future lab order entered. Pt has seen the results on Mychart and a message has been sent for the patient to call back with any questions.

## 2016-04-27 ENCOUNTER — Other Ambulatory Visit: Payer: Self-pay | Admitting: Medical

## 2016-04-27 ENCOUNTER — Ambulatory Visit (INDEPENDENT_AMBULATORY_CARE_PROVIDER_SITE_OTHER): Payer: 59 | Admitting: Gastroenterology

## 2016-04-27 ENCOUNTER — Encounter: Payer: Self-pay | Admitting: Gastroenterology

## 2016-04-27 VITALS — BP 112/74 | HR 84 | Ht 62.0 in | Wt 189.8 lb

## 2016-04-27 DIAGNOSIS — K219 Gastro-esophageal reflux disease without esophagitis: Secondary | ICD-10-CM | POA: Diagnosis not present

## 2016-04-27 DIAGNOSIS — G441 Vascular headache, not elsewhere classified: Secondary | ICD-10-CM

## 2016-04-27 NOTE — Patient Instructions (Signed)
Restart the omeprazole 20mg  pill, every other day 20-30 min before BF meal. Call in 5-6 weeks to report on any changes. Can try pepcid (at bedtime 20mg ).

## 2016-04-27 NOTE — Progress Notes (Signed)
Review of pertinent gastrointestinal problems: 1. GERD, globus symptoms. EGD Dr. Ardis Hughs 2012 was normal. PPI seemed to help her symptoms. 2. Heme + stool (gyn);  Colonoscopy Dr. Ardis Hughs 04/2015 was normal.  HPI: This is a  very pleasant 45 year old woman whom I last saw about a year ago time of colonoscopy  Chief complaint is GERD  She stopped prilosec; after the March 2017  CT scan said pill capsules in her colon.  She is also concerned about potential long-term side effects of proton pump inhibitors  Will have flare 1-2 times per week of pyrosis  Zantac makes her feel groggy.  This is  A very reliable side effect for her   CT scan abd/pelvis with IV contrast 02/2016: no GI issues.  Past Medical History  Diagnosis Date  . Acne   . Heart murmur     ? heart murmur in past  . GERD (gastroesophageal reflux disease)     worse while pregnant  . Diabetes mellitus     gest this pregnancy  . Hypertension   . Depression   . Anxiety   . Headache     Migraines  . Anemia     history of  . History of blood transfusion 2013    Past Surgical History  Procedure Laterality Date  . Cesarean section      x 2  . Esophagogastroduodenoscopy  normal     7/12  . Cesarean section  09/29/2012    Procedure: CESAREAN SECTION;  Surgeon: Luz Lex, MD;  Location: [facility];  Service: Obstetrics;  Laterality: N/A;  Repeat edc 10/12/12/REQUEST;Chassity,Dee,Colleen  . Colonoscopy    . Mouth surgery    . Laparoscopic vaginal hysterectomy with salpingectomy Bilateral 02/23/2016    Procedure: LAPAROSCOPIC ASSISTED VAGINAL HYSTERECTOMY WITH bilateral SALPINGECTOMY, right oophorectomy. laporotic repair of incidental cystotomy.;  Surgeon: Louretta Shorten, MD;  Location: [facility];  Service: Gynecology;  Laterality: Bilateral;    Current Outpatient Prescriptions  Medication Sig Dispense Refill  . ALPRAZolam (XANAX) 0.5 MG tablet Take 0.5 mg by mouth 2 (two) times daily as needed for anxiety.     Marland Kitchen aspirin 81 MG  tablet Take 81 mg by mouth daily.    Marland Kitchen b complex vitamins capsule Take 1 capsule by mouth daily.    . Cholecalciferol (VITAMIN D) 2000 units CAPS Take 2,000 Units by mouth daily.     Marland Kitchen FLUoxetine (PROZAC) 40 MG capsule Take 40 mg by mouth daily.     . hydrochlorothiazide (MICROZIDE) 12.5 MG capsule Take 1 capsule (12.5 mg total) by mouth daily. 90 capsule 1  . loratadine (CLARITIN) 10 MG tablet Take 10 mg by mouth daily.    . meloxicam (MOBIC) 15 MG tablet Take 15 mg by mouth daily.    . potassium chloride (K-DUR,KLOR-CON) 10 MEQ tablet Take 1 tablet (10 mEq total) by mouth once. 14 tablet 0  . Vitamin D, Ergocalciferol, (DRISDOL) 50000 units CAPS capsule Take 1 capsule (50,000 Units total) by mouth every 7 (seven) days. 6 capsule 0  . zolpidem (AMBIEN) 10 MG tablet Take 10 mg by mouth at bedtime as needed for sleep.   5  . [DISCONTINUED] pantoprazole (PROTONIX) 40 MG tablet Take 40 mg by mouth daily before breakfast.       No current facility-administered medications for this visit.    Allergies as of 04/27/2016 - Review Complete 04/27/2016  Allergen Reaction Noted  . Azithromycin Nausea And Vomiting 02/06/2015    Family History  Problem Relation Age  of Onset  . Depression Mother   . Hypertension Mother   . Diabetes Mother   . Obesity Mother   . Thyroid disease Mother   . Cancer Father     ? lung CA  . Kidney disease Father   . Hydrocephalus Sister   . Colon cancer Neg Hx     Social History   Social History  . Marital Status: Married    Spouse Name: N/A  . Number of Children: 2  . Years of Education: N/A   Occupational History  . Not on file.   Social History Main Topics  . Smoking status: Never Smoker   . Smokeless tobacco: Never Used  . Alcohol Use: Yes     Comment: 1 glass of wine per week prior to preg  . Drug Use: No  . Sexual Activity: Yes   Other Topics Concern  . Not on file   Social History Narrative     Physical Exam: BP 112/74 mmHg  Pulse 84   Ht 5\' 2"  (1.575 m)  Wt 189 lb 12.8 oz (86.093 kg)  BMI 34.71 kg/m2  LMP 02/11/2016 (Exact Date) Constitutional: generally well-appearing Psychiatric: alert and oriented x3 Abdomen: soft, nontender, nondistended, no obvious ascites, no peritoneal signs, normal bowel sounds   Assessment and plan: 45 y.o. female with Chronic GERD  Her fairly classic GERD symptoms do respond to proton pump inhibitor even at 20 mg dosing once daily. Concerned about potential long-term side effects of these and we discussed that. She has very reliable side effect from Zantac nightly which was grogginess and some fatigue in the morning. That is not a very typical side effect but she says it is very reliable for her. She is bright, in tune with her symptoms well enough that I do believe that is true. She is going to try Pepcid at bedtime and see if it causes the same type of side effect for her. If it does she will restart the omeprazole which helps her symptoms but she is going to try taking it every other day shortly before breakfast meal. She will call to report on her response in 5-6 weeks.   Ann Loffler, MD [facility] 04/27/2016, 2:24 PM

## 2016-04-30 ENCOUNTER — Other Ambulatory Visit: Payer: Self-pay | Admitting: Medical

## 2016-04-30 DIAGNOSIS — G441 Vascular headache, not elsewhere classified: Secondary | ICD-10-CM

## 2016-05-04 MED ORDER — VITAMIN D (ERGOCALCIFEROL) 1.25 MG (50000 UNIT) PO CAPS: 50000.0000 [IU] | ORAL_CAPSULE | ORAL | Status: DC

## 2016-05-12 ENCOUNTER — Telehealth: Payer: Self-pay | Admitting: Medical

## 2016-05-12 DIAGNOSIS — R7989 Other specified abnormal findings of blood chemistry: Secondary | ICD-10-CM

## 2016-05-12 DIAGNOSIS — E785 Hyperlipidemia, unspecified: Secondary | ICD-10-CM

## 2016-05-12 NOTE — Telephone Encounter (Signed)
I put lipid panel and vitamin d order for today date. If she needs future order will you place.

## 2016-05-12 NOTE — Telephone Encounter (Signed)
Please advise 

## 2016-05-12 NOTE — Telephone Encounter (Signed)
Relation to pt: self Call back [phone] Pharmacy:  Reason for call:  Requesting orders for cholesterol and vitamin D. Patient scheduled lab appointment for 05/12/16 [facility] (orders placed). Please advise

## 2016-05-13 ENCOUNTER — Other Ambulatory Visit: Payer: 59

## 2016-05-13 NOTE — Telephone Encounter (Signed)
Left detailed message for pt that E. Saguier has put in future orders for a lipid panel and vitamin D blood work as well. Pt was advised to call the office back with any questions.

## 2016-08-11 ENCOUNTER — Other Ambulatory Visit: Payer: Self-pay | Admitting: Medical

## 2016-09-17 ENCOUNTER — Other Ambulatory Visit: Payer: Self-pay | Admitting: Medical

## 2016-09-17 ENCOUNTER — Telehealth: Payer: Self-pay | Admitting: Medical

## 2016-09-17 DIAGNOSIS — G441 Vascular headache, not elsewhere classified: Secondary | ICD-10-CM

## 2016-09-17 NOTE — Telephone Encounter (Signed)
Pt sent my- chart message stating that she would like to schedule a lab. Is this okay to schedule?

## 2016-09-17 NOTE — Telephone Encounter (Signed)
Unable to reach patient. Patients phone has continuous busy signal.

## 2016-09-18 MED ORDER — CYCLOBENZAPRINE HCL 5 MG PO TABS: 5.0000 mg | ORAL_TABLET | Freq: Every day | ORAL | 1 refills | Status: DC

## 2016-09-18 MED ORDER — HYDROCHLOROTHIAZIDE 12.5 MG PO CAPS: 12.5000 mg | ORAL_CAPSULE | Freq: Every day | ORAL | 1 refills | Status: DC

## 2016-09-18 NOTE — Telephone Encounter (Signed)
Pt states sent email I did not find that in epic. This one I could not find? So will you call pt and ask her what is her request regarding labs. It does look like in May she has active labs that appear to never have been done? You could go over those orders with her to see if that is what she is referring to.  Did refill her flexeril and her hctz.

## 2016-09-21 NOTE — Telephone Encounter (Signed)
Called patient. Left message on answering machine fo call back regarding lab orders.

## 2016-10-10 ENCOUNTER — Other Ambulatory Visit: Payer: Self-pay | Admitting: Medical

## 2016-10-13 IMAGING — CT CT HEAD W/O CM
1 series · 16 of 30 positions shown, 20 images · non-contrast
Comparison: None.

CLINICAL DATA: Headache. Left upper extremity weakness. Patient
reports numbness and tingling in both upper extremities for several
days.

EXAM:
CT HEAD WITHOUT CONTRAST
TECHNIQUE: Contiguous axial images were obtained from the base of the skull
through the vertex without intravenous contrast.

[Series 2: head wo · axial · 0.45mm/px · z∈[-111,+19]mm · 16 of 30 slices shown, 20 images]
[im 2/30  brain]
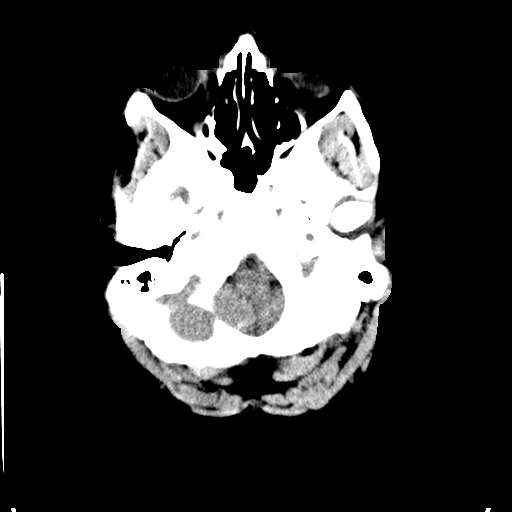
[im 2/30  bone]
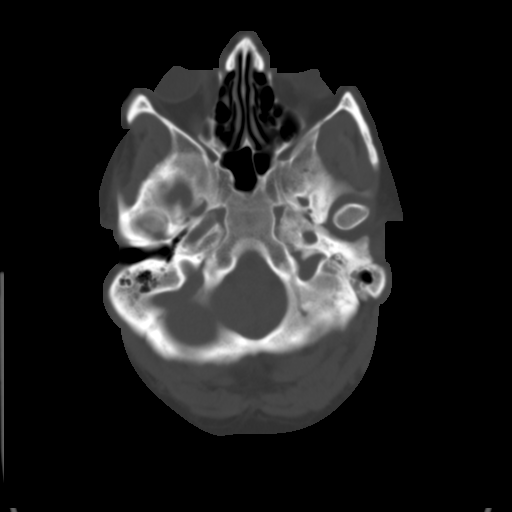
[im 4/30  brain]
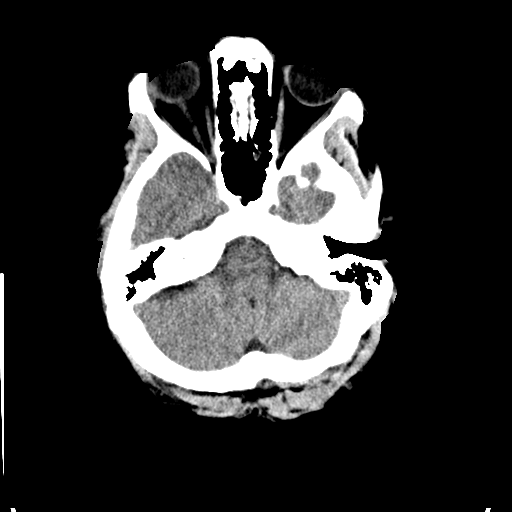
[im 6/30  brain]
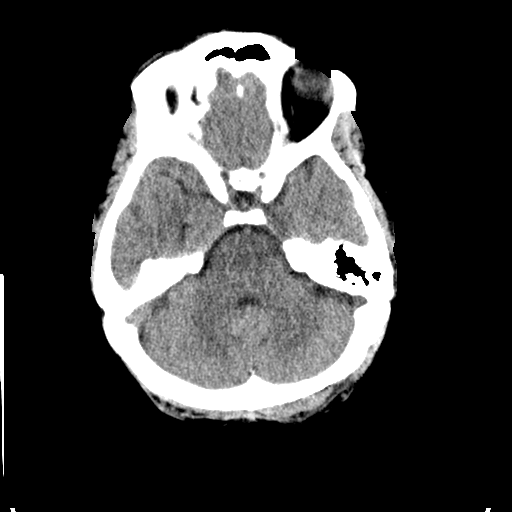
[im 8/30  brain]
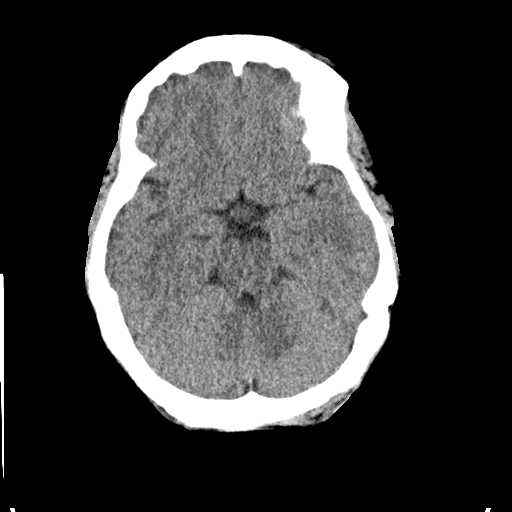
[im 9/30  brain]
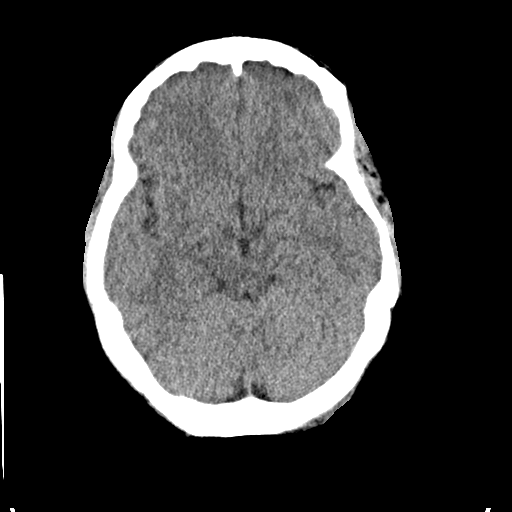
[im 9/30  bone]
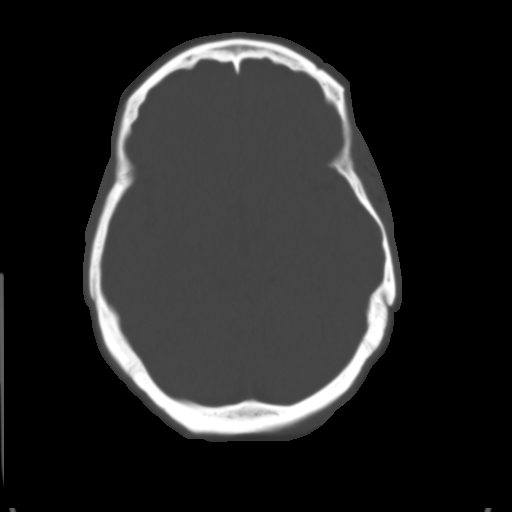
[im 11/30  brain]
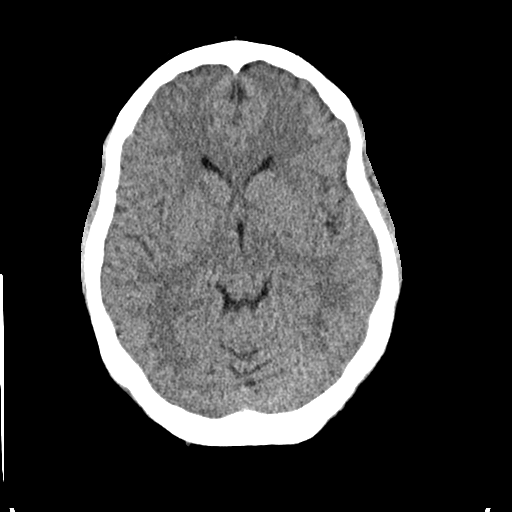
[im 13/30  brain]
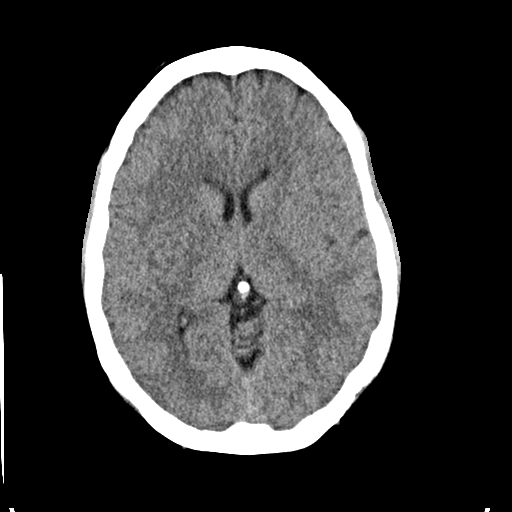
[im 15/30  brain]
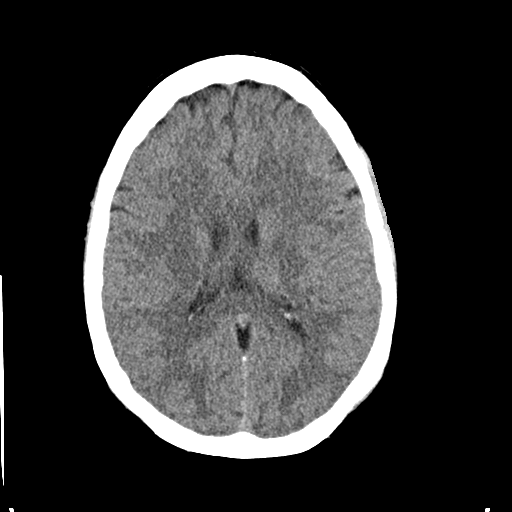
[im 16/30  brain]
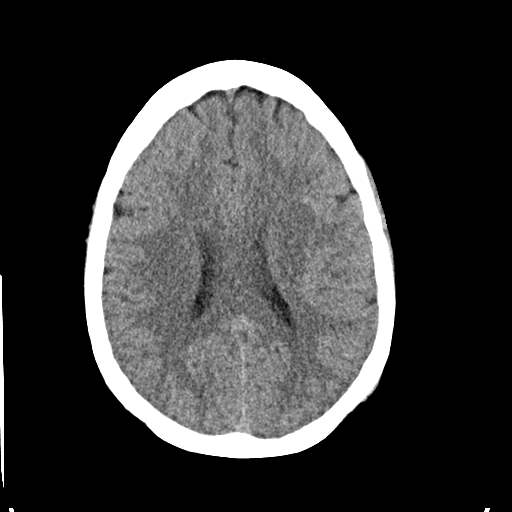
[im 16/30  bone]
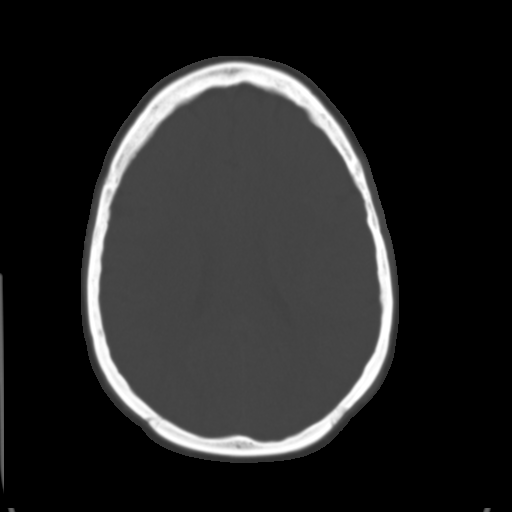
[im 18/30  brain]
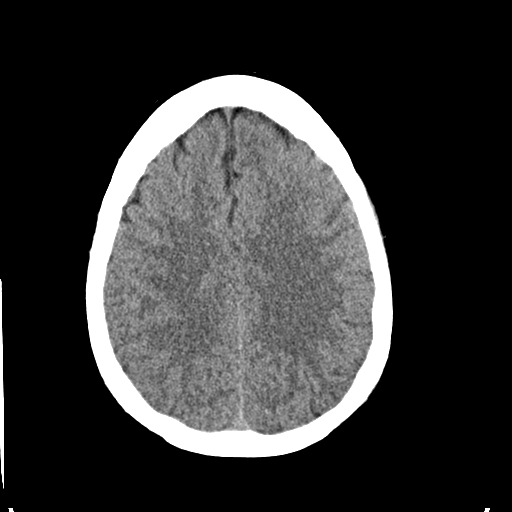
[im 20/30  brain]
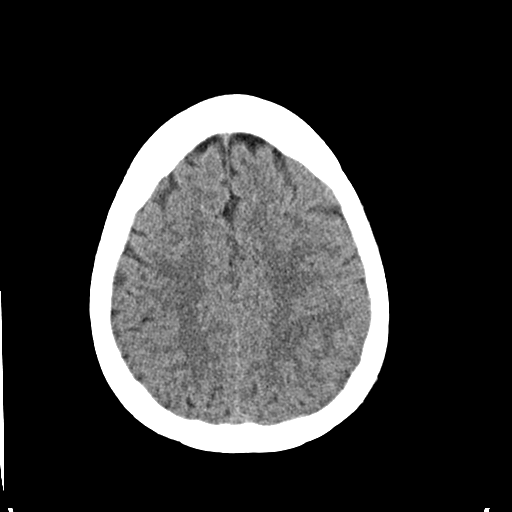
[im 22/30  brain]
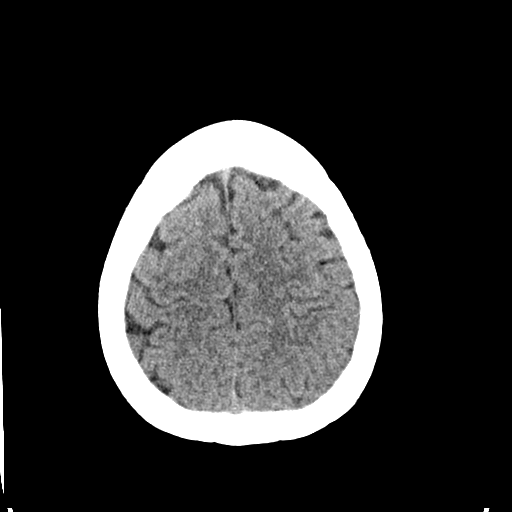
[im 23/30  brain]
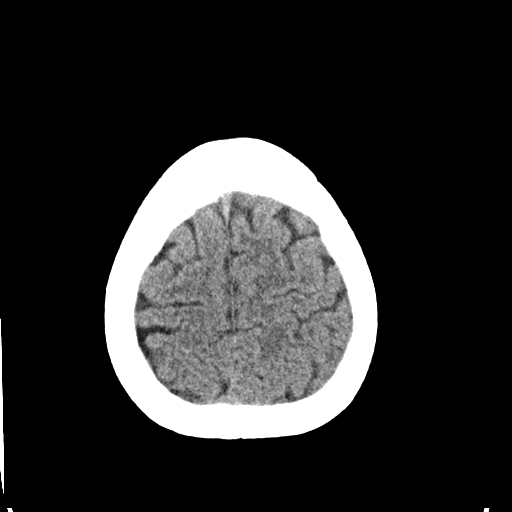
[im 23/30  bone]
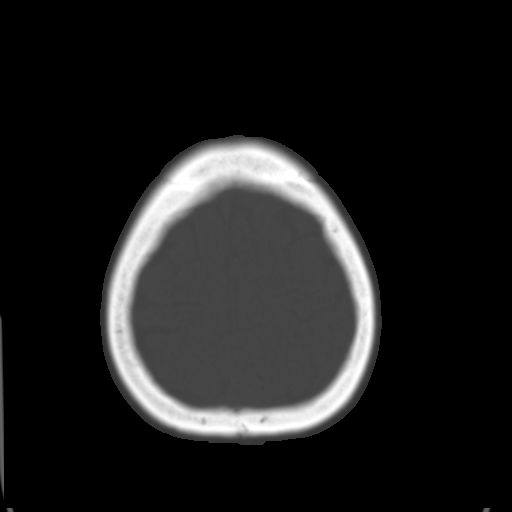
[im 25/30  brain]
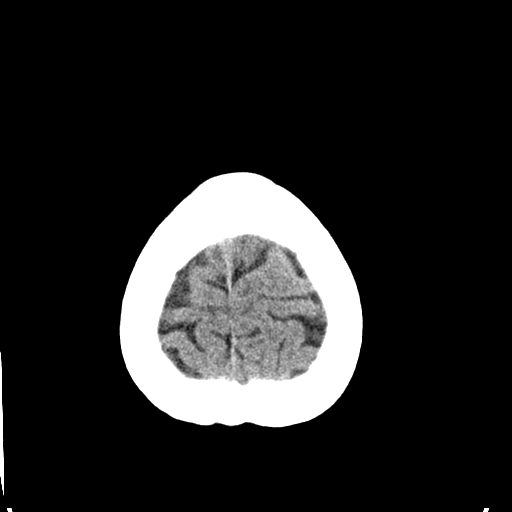
[im 27/30  brain]
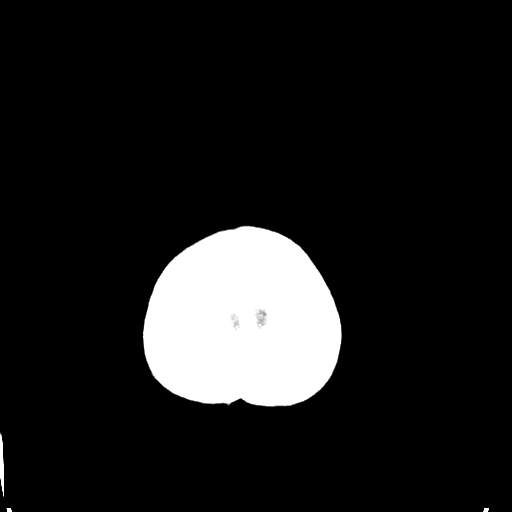
[im 29/30  brain]
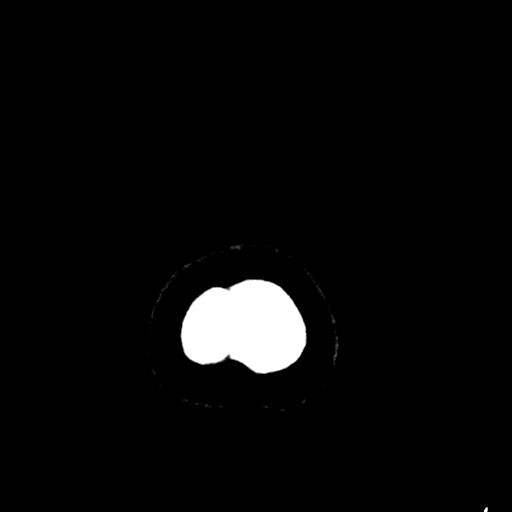

[16 of 30 positions shown; findings below may reference images not displayed]

FINDINGS: No evidence of parenchymal hemorrhage or extra-axial fluid
collection. No mass lesion, mass effect, or midline shift.

No CT evidence of acute infarction.

Cerebral volume is age appropriate. No ventriculomegaly.

The visualized paranasal sinuses are essentially clear. The mastoid
air cells are unopacified. No evidence of calvarial fracture.
IMPRESSION: Negative head CT.  No evidence of acute intracranial abnormality.

## 2016-11-11 ENCOUNTER — Ambulatory Visit (INDEPENDENT_AMBULATORY_CARE_PROVIDER_SITE_OTHER): Payer: 59 | Admitting: Medical

## 2016-11-11 VITALS — BP 133/76 | HR 79 | Temp 98.4°F | Wt 192.2 lb

## 2016-11-11 DIAGNOSIS — R1032 Left lower quadrant pain: Secondary | ICD-10-CM

## 2016-11-11 DIAGNOSIS — R350 Frequency of micturition: Secondary | ICD-10-CM | POA: Diagnosis not present

## 2016-11-11 DIAGNOSIS — Z23 Encounter for immunization: Secondary | ICD-10-CM | POA: Diagnosis not present

## 2016-11-11 LAB — POC URINALSYSI DIPSTICK (AUTOMATED)
Bilirubin, UA: NEGATIVE
Glucose, UA: NEGATIVE
Ketones, UA: NEGATIVE
Leukocytes, UA: NEGATIVE
NITRITE UA: NEGATIVE
PH UA: 5.5
PROTEIN UA: NEGATIVE
RBC UA: NEGATIVE
UROBILINOGEN UA: 2

## 2016-11-11 MED ORDER — DICLOFENAC SODIUM 75 MG PO TBEC: 75.0000 mg | DELAYED_RELEASE_TABLET | Freq: Two times a day (BID) | ORAL | 0 refills | Status: DC

## 2016-11-11 NOTE — Progress Notes (Signed)
Subjective:    Patient ID: Ann Frederick, female    DOB: 16-Feb-1971, 45 y.o.   MRN: [id number]  HPI  Pt in with some lower abd/ llq area pain. She states faint pressure sensation that is constant. Notices more when she holds her urine. She states pain for six weeks.  Pt states had 3 section, and hysterectomy. Pt not having any diarrhea. No constipation. No flank or kidney region pain. Pt states in past with surgery bladder cut/laceration.  No fever, no chills or sweats.   Pt has some pain at time when she urinates and some pain in area when she has bm. She points to area of pain as directly over old scar site from prior surgery. Points to trochar scar site as point of most painful point.  Pt has no blood in stool bright red and black.   Review of Systems  Constitutional: Negative for diaphoresis, fatigue and fever.  Respiratory: Negative for cough, choking, chest tightness, shortness of breath and wheezing.   Cardiovascular: Negative for chest pain and palpitations.  Gastrointestinal: Positive for abdominal pain. Negative for constipation, diarrhea, nausea and vomiting.  Genitourinary: Positive for frequency. Negative for decreased urine volume, difficulty urinating, dysuria, hematuria and urgency.  Musculoskeletal: Negative for back pain.  Neurological: Negative for dizziness.  Hematological: Negative for adenopathy. Does not bruise/bleed easily.  Psychiatric/Behavioral: Negative for agitation and confusion.    Past Medical History:  Diagnosis Date  . Acne   . Anemia    history of  . Anxiety   . Depression   . Diabetes mellitus    gest this pregnancy  . GERD (gastroesophageal reflux disease)    worse while pregnant  . Headache    Migraines  . Heart murmur    ? heart murmur in past  . History of blood transfusion 2013  . Hypertension      Social History   Social History  . Marital status: Married    Spouse name: N/A  . Number of children: 2  . Years of education:  N/A   Occupational History  . Not on file.   Social History Main Topics  . Smoking status: Never Smoker  . Smokeless tobacco: Never Used  . Alcohol use Yes     Comment: 1 glass of wine per week prior to preg  . Drug use: No  . Sexual activity: Yes   Other Topics Concern  . Not on file   Social History Narrative  . No narrative on file    Past Surgical History:  Procedure Laterality Date  . CESAREAN SECTION     x 2  . CESAREAN SECTION  09/29/2012   Procedure: CESAREAN SECTION;  Surgeon: Luz Lex, MD;  Location: [facility];  Service: Obstetrics;  Laterality: N/A;  Repeat edc 10/12/12/REQUEST;Chassity,Dee,Colleen  . COLONOSCOPY    . ESOPHAGOGASTRODUODENOSCOPY  normal    7/12  . LAPAROSCOPIC VAGINAL HYSTERECTOMY WITH SALPINGECTOMY Bilateral 02/23/2016   Procedure: LAPAROSCOPIC ASSISTED VAGINAL HYSTERECTOMY WITH bilateral SALPINGECTOMY, right oophorectomy. laporotic repair of incidental cystotomy.;  Surgeon: Louretta Shorten, MD;  Location: [facility];  Service: Gynecology;  Laterality: Bilateral;  . MOUTH SURGERY      Family History  Problem Relation Age of Onset  . Depression Mother   . Hypertension Mother   . Diabetes Mother   . Obesity Mother   . Thyroid disease Mother   . Cancer Father     ? lung CA  . Kidney disease Father   . Hydrocephalus  Sister   . Colon cancer Neg Hx     Allergies  Allergen Reactions  . Azithromycin Nausea And Vomiting    diarrhea    Current Outpatient Prescriptions on File Prior to Visit  Medication Sig Dispense Refill  . ALPRAZolam (XANAX) 0.5 MG tablet Take 0.5 mg by mouth 2 (two) times daily as needed for anxiety.     Marland Kitchen aspirin 81 MG tablet Take 81 mg by mouth daily.    Marland Kitchen b complex vitamins capsule Take 1 capsule by mouth daily.    . Cholecalciferol (VITAMIN D) 2000 units CAPS Take 2,000 Units by mouth daily.     . cyclobenzaprine (FLEXERIL) 5 MG tablet Take 1 tablet (5 mg total) by mouth at bedtime. 20 tablet 1  . FLUoxetine (PROZAC) 40 MG  capsule Take 40 mg by mouth daily.     . hydrochlorothiazide (MICROZIDE) 12.5 MG capsule Take 1 capsule (12.5 mg total) by mouth daily. 90 capsule 1  . Vitamin D, Ergocalciferol, (DRISDOL) 50000 units CAPS capsule Take 1 capsule (50,000 Units total) by mouth every 7 (seven) days. 6 capsule 0  . zolpidem (AMBIEN) 10 MG tablet Take 10 mg by mouth at bedtime as needed for sleep.   5  . loratadine (CLARITIN) 10 MG tablet Take 10 mg by mouth daily.    . meloxicam (MOBIC) 15 MG tablet Take 15 mg by mouth daily.    . potassium chloride (K-DUR,KLOR-CON) 10 MEQ tablet Take 1 tablet (10 mEq total) by mouth once. (Patient not taking: Reported on 11/11/2016) 14 tablet 0  . [DISCONTINUED] pantoprazole (PROTONIX) 40 MG tablet Take 40 mg by mouth daily before breakfast.       No current facility-administered medications on file prior to visit.     BP 133/76 (BP Location: Right Arm, Patient Position: Sitting, Cuff Size: Normal)   Pulse 79   Temp 98.4 F (36.9 C) (Oral)   Wt 192 lb 3.2 oz (87.2 kg)   LMP 02/11/2016 (Exact Date)   SpO2 99%   BMI 35.15 kg/m       Objective:   Physical Exam  General Appearance- Not in acute distress.  HEENT Eyes- Scleraeral/Conjuntiva-bilat- Not Yellow. Mouth & Throat- Normal.  Chest and Lung Exam Auscultation: Breath sounds:-Normal. Adventitious sounds:- No Adventitious sounds.  Cardiovascular Auscultation:Rythm - Regular. Heart Sounds -Normal heart sounds.  Abdomen Inspection:-Inspection Normal.  Palpation/Perucssion: Palpation and Percussion of the abdomen reveal-  Tender directly over old abd scar site appears to be trochar site.  On direct palpation in this area causes moderate to severe pain. No obvious hernia, No Rebound tenderness, No rigidity(Guarding) and No Palpable abdominal masses.  Liver:-Normal.  Spleen:- Normal.   Back- no cva tenderness.      Assessment & Plan:  For your lower abdomen region pain we got urine dip and culture  today.  For pain over prior surgical site will get limited abd [place] to evaluate if hernia. Also other dx may be adhesions?   For pain use diclofenac. rx today.  If pain worsens or changes let us know. If [place] negative and pain persists consider ct abd/pelvis.  Follow up in 10-14 days or as needed

## 2016-11-11 NOTE — Patient Instructions (Addendum)
For your lower abdomen region pain we got urine dip and culture today.  For pain over prior surgical site will get limited abd [place] to evaluate if hernia. Also other dx may be adhesions?   For pain use diclofenac. rx today.  If pain worsens or changes let us know. If [place] negative and pain persists consider ct abd/pelvis.  Follow up in 10-14 days or as needed

## 2016-11-11 NOTE — Progress Notes (Signed)
Pre visit review using our clinic review tool, if applicable. No additional management support is needed unless otherwise documented below in the visit note. 

## 2016-11-12 ENCOUNTER — Ambulatory Visit (HOSPITAL_BASED_OUTPATIENT_CLINIC_OR_DEPARTMENT_OTHER)
Admission: RE | Admit: 2016-11-12 | Discharge: 2016-11-12 | Disposition: A | Discharge status: Home / Self Care | Payer: 59 | Source: Ambulatory Visit | Attending: Medical | Admitting: Medical

## 2016-11-12 DIAGNOSIS — Z23 Encounter for immunization: Secondary | ICD-10-CM

## 2016-11-12 DIAGNOSIS — R1032 Left lower quadrant pain: Secondary | ICD-10-CM | POA: Diagnosis not present

## 2016-12-27 HISTORY — PX: ANTERIOR FUSION CERVICAL SPINE: SUR626

## 2017-04-13 ENCOUNTER — Encounter: Payer: Self-pay | Admitting: Medical

## 2017-04-17 ENCOUNTER — Other Ambulatory Visit: Payer: Self-pay | Admitting: Medical

## 2017-04-20 ENCOUNTER — Ambulatory Visit (INDEPENDENT_AMBULATORY_CARE_PROVIDER_SITE_OTHER): Payer: 59 | Admitting: Medical

## 2017-04-20 ENCOUNTER — Encounter: Payer: Self-pay | Admitting: Medical

## 2017-04-20 VITALS — BP 132/79 | HR 80 | Temp 98.1°F | Resp 16 | Ht 62.0 in | Wt 185.6 lb

## 2017-04-20 DIAGNOSIS — M79671 Pain in right foot: Secondary | ICD-10-CM | POA: Diagnosis not present

## 2017-04-20 DIAGNOSIS — E669 Obesity, unspecified: Secondary | ICD-10-CM | POA: Diagnosis not present

## 2017-04-20 DIAGNOSIS — R109 Unspecified abdominal pain: Secondary | ICD-10-CM

## 2017-04-20 NOTE — Progress Notes (Signed)
Pre visit review using our clinic review tool, if applicable. No additional management support is needed unless otherwise documented below in the visit note. Issues

## 2017-04-20 NOTE — Progress Notes (Signed)
Subjective:    Patient ID: Ann Frederick, female    DOB: November 28, 1971, 46 y.o.   MRN: [id number]  HPI  Pt in for evaluation.  She still has some umbulical area pain and just below. Pt has hx of hysterectomy and punctured bladder in past. Past 6 weeks irritated sensation int area below umbilicus. Prior US did not show any ultrasound. No fever, no chills, no diarrhea, no constipation and no uti signs or symptoms.   Pt also rt great toe pain  at base has some mild swelling at base. Hurts a little bit when she exercises. Pt ok with getting xray but would not want any surgical intervention since she is trying to loose weight.  Pt has been on wellutrin and has been on for 2 months and will continue. It is helping her loose weight. Pt has lost about 15 pounds over past 2 months.     Review of Systems  Constitutional: Negative for chills, fatigue and fever.  Respiratory: Negative for chest tightness, shortness of breath and wheezing.   Cardiovascular: Negative for chest pain and palpitations.  Gastrointestinal: Positive for abdominal pain. Negative for blood in stool, constipation, diarrhea, nausea, rectal pain and vomiting.       Compliant to MA differed from description given to me.  Musculoskeletal:       Foot pain.  Skin: Negative for rash.  Neurological: Negative for dizziness, tremors, facial asymmetry and headaches.  Hematological: Negative for adenopathy.  Psychiatric/Behavioral: Positive for suicidal ideas. Negative for behavioral problems and confusion.    Past Medical History:  Diagnosis Date  . Acne   . Anemia    history of  . Anxiety   . Depression   . Diabetes mellitus    gest this pregnancy  . GERD (gastroesophageal reflux disease)    worse while pregnant  . Headache    Migraines  . Heart murmur    ? heart murmur in past  . History of blood transfusion 2013  . Hypertension      Social History   Social History  . Marital status: Married    Spouse name: N/A    . Number of children: 2  . Years of education: N/A   Occupational History  . Not on file.   Social History Main Topics  . Smoking status: Never Smoker  . Smokeless tobacco: Never Used  . Alcohol use Yes     Comment: 1 glass of wine per week prior to preg  . Drug use: No  . Sexual activity: Yes   Other Topics Concern  . Not on file   Social History Narrative  . No narrative on file    Past Surgical History:  Procedure Laterality Date  . CESAREAN SECTION     x 2  . CESAREAN SECTION  09/29/2012   Procedure: CESAREAN SECTION;  Surgeon: Luz Lex, MD;  Location: [facility];  Service: Obstetrics;  Laterality: N/A;  Repeat edc 10/12/12/REQUEST;Chassity,Dee,Colleen  . COLONOSCOPY    . ESOPHAGOGASTRODUODENOSCOPY  normal    7/12  . LAPAROSCOPIC VAGINAL HYSTERECTOMY WITH SALPINGECTOMY Bilateral 02/23/2016   Procedure: LAPAROSCOPIC ASSISTED VAGINAL HYSTERECTOMY WITH bilateral SALPINGECTOMY, right oophorectomy. laporotic repair of incidental cystotomy.;  Surgeon: Louretta Shorten, MD;  Location: [facility];  Service: Gynecology;  Laterality: Bilateral;  . MOUTH SURGERY      Family History  Problem Relation Age of Onset  . Depression Mother   . Hypertension Mother   . Diabetes Mother   . Obesity Mother   .  Thyroid disease Mother   . Cancer Father     ? lung CA  . Kidney disease Father   . Hydrocephalus Sister   . Colon cancer Neg Hx     Allergies  Allergen Reactions  . Azithromycin Nausea And Vomiting    diarrhea    Current Outpatient Prescriptions on File Prior to Visit  Medication Sig Dispense Refill  . ALPRAZolam (XANAX) 0.5 MG tablet Take 0.5 mg by mouth 2 (two) times daily as needed for anxiety.     Marland Kitchen aspirin 81 MG tablet Take 81 mg by mouth daily.    Marland Kitchen b complex vitamins capsule Take 1 capsule by mouth daily.    . Cholecalciferol (VITAMIN D) 2000 units CAPS Take 2,000 Units by mouth daily.     . cyclobenzaprine (FLEXERIL) 5 MG tablet Take 1 tablet (5 mg total) by mouth at  bedtime. 20 tablet 1  . FLUoxetine (PROZAC) 40 MG capsule Take 40 mg by mouth daily.     . hydrochlorothiazide (MICROZIDE) 12.5 MG capsule Take 1 capsule (12.5 mg total) by mouth daily. 90 capsule 1  . zolpidem (AMBIEN) 10 MG tablet Take 10 mg by mouth at bedtime as needed for sleep.   5  . [DISCONTINUED] pantoprazole (PROTONIX) 40 MG tablet Take 40 mg by mouth daily before breakfast.       No current facility-administered medications on file prior to visit.     BP 132/79 (BP Location: Left Arm, Patient Position: Sitting, Cuff Size: Normal)   Pulse 80   Temp 98.1 F (36.7 C) (Oral)   Resp 16   Ht 5\' 2"  (1.575 m)   Wt 185 lb 9.6 oz (84.2 kg)   LMP 02/11/2016 (Exact Date)   SpO2 98%   BMI 33.95 kg/m       Objective:   Physical Exam  General- No acute distress. Pleasant patient. Neck- Full range of motion, no jvd Lungs- Clear, even and unlabored. Heart- regular rate and rhythm. Neurologic- CNII- XII grossly intact.  Rt foot- rt great toe deviation inward. At base of great toe had some  mild swelling distal 1st metatarsal area.  Other areas of foot normal. No pain.     Assessment & Plan:  For your transient atypical abdomen pain will watch this area closely. Could repeat US in prior surgical area if any increase pain or bulge.  For foot pain will put in future foot xray. You can choose when it is done. Clinically appears to be hallux valgus.  For weight loss continue wellbutrin. Healthy diety and exercise.  Future labs placed in past. Please schedule to get those done fasting.  Follow up as needed

## 2017-04-20 NOTE — Patient Instructions (Signed)
For your transient atypical abdomen pain will watch this area closely. Could repeat US in prior surgical area if any increase pain or bulge.  For foot pain will put in future foot xray. You can choose when it is done. Clinically appears to be hallux valgus.  For weight loss continue wellbutrin. Healthy diety and exercise.  Future labs placed in past. Please schedule to get those done fasting.  Follow up as needed

## 2017-04-22 ENCOUNTER — Other Ambulatory Visit (INDEPENDENT_AMBULATORY_CARE_PROVIDER_SITE_OTHER): Payer: 59

## 2017-04-22 DIAGNOSIS — R109 Unspecified abdominal pain: Secondary | ICD-10-CM | POA: Diagnosis not present

## 2017-04-22 DIAGNOSIS — E559 Vitamin D deficiency, unspecified: Secondary | ICD-10-CM | POA: Diagnosis not present

## 2017-04-22 DIAGNOSIS — E785 Hyperlipidemia, unspecified: Secondary | ICD-10-CM

## 2017-04-22 LAB — BASIC METABOLIC PANEL
BUN: 6 mg/dL (ref 6–23)
CALCIUM: 9.5 mg/dL (ref 8.4–10.5)
CO2: 27 meq/L (ref 19–32)
CREATININE: 0.84 mg/dL (ref 0.40–1.20)
Chloride: 101 mEq/L (ref 96–112)
GFR: 94.06 mL/min (ref >60.00)
Glucose, Bld: 96 mg/dL (ref 70–99)
Potassium: 3.4 mEq/L — ABNORMAL LOW (ref 3.5–5.1)
Sodium: 137 mEq/L (ref 135–145)

## 2017-04-22 LAB — LIPID PANEL
CHOL/HDL RATIO: 4
CHOLESTEROL: 209 mg/dL — AB (ref 0–200)
HDL: 50 mg/dL (ref >39.00)
LDL CALC: 130 mg/dL — AB (ref 0–99)
NonHDL: 158.53
Triglycerides: 144 mg/dL (ref 0.0–149.0)
VLDL: 28.8 mg/dL (ref 0.0–40.0)

## 2017-04-22 LAB — VITAMIN D 25 HYDROXY (VIT D DEFICIENCY, FRACTURES): VITD: 35.31 ng/mL (ref 30.00–100.00)

## 2017-04-22 NOTE — Addendum Note (Signed)
Addended by: Caffie Pinto on: 04/22/2017 08:58 AM   Modules accepted: Orders

## 2017-04-23 ENCOUNTER — Encounter: Payer: Self-pay | Admitting: Medical

## 2017-04-27 MED ORDER — ATORVASTATIN CALCIUM 10 MG PO TABS: 10.0000 mg | ORAL_TABLET | Freq: Every day | ORAL | 3 refills | Status: DC

## 2017-04-27 MED ORDER — POTASSIUM CHLORIDE ER 10 MEQ PO TBCR: EXTENDED_RELEASE_TABLET | ORAL | 0 refills | Status: DC

## 2017-04-27 NOTE — Telephone Encounter (Signed)
Let pt know decided on low dose atorvastatin. rx of this and potassium sent in.

## 2017-05-18 ENCOUNTER — Telehealth: Payer: 59 | Admitting: Nurse Practitioner

## 2017-05-18 DIAGNOSIS — N3000 Acute cystitis without hematuria: Secondary | ICD-10-CM

## 2017-05-18 MED ORDER — CIPROFLOXACIN HCL 500 MG PO TABS: 500.0000 mg | ORAL_TABLET | Freq: Two times a day (BID) | ORAL | 0 refills | Status: DC

## 2017-05-18 NOTE — Progress Notes (Signed)
*   had to call patient because she put in mail order pharmacy changed to [facility] on [address] as patient requested   We are sorry that you are not feeling well.  Here is how we plan to help!  Based on what you shared with me it looks like you most likely have a simple urinary tract infection.  A UTI (Urinary Tract Infection) is a bacterial infection of the bladder.  Most cases of urinary tract infections are simple to treat but a key part of your care is to encourage you to drink plenty of fluids and watch your symptoms carefully.  I have prescribed Ciprofloxacin 500 mg twice a day for 5 days.  Your symptoms should gradually improve. Call us if the burning in your urine worsens, you develop worsening fever, back pain or pelvic pain or if your symptoms do not resolve after completing the antibiotic.  Urinary tract infections can be prevented by drinking plenty of water to keep your body hydrated.  Also be sure when you wipe, wipe from front to back and don't hold it in!  If possible, empty your bladder every 4 hours.  Your e-visit answers were reviewed by a board certified advanced clinical practitioner to complete your personal care plan.  Depending on the condition, your plan could have included both over the counter or prescription medications.  If there is a problem please reply  once you have received a response from your provider.  Your safety is important to [place].  If you have drug allergies check your prescription carefully.    You can use MyChart to ask questions about today's visit, request a non-urgent call back, or ask for a work or school excuse for 24 hours related to this e-Visit. If it has been greater than 24 hours you will need to follow up with your provider, or enter a new e-Visit to address those concerns.   You will get an e-mail in the next two days asking about your experience.  I hope that your e-visit has been valuable and will speed your recovery. Thank you for using  e-visits.

## 2017-05-19 ENCOUNTER — Ambulatory Visit: Payer: 59 | Admitting: Medical

## 2017-05-19 DIAGNOSIS — Z0289 Encounter for other administrative examinations: Secondary | ICD-10-CM

## 2017-06-08 ENCOUNTER — Other Ambulatory Visit: Payer: Self-pay

## 2017-06-08 MED ORDER — ATORVASTATIN CALCIUM 10 MG PO TABS: 10.0000 mg | ORAL_TABLET | Freq: Every day | ORAL | 0 refills | Status: DC

## 2017-07-10 ENCOUNTER — Other Ambulatory Visit: Payer: Self-pay | Admitting: Medical

## 2017-08-10 ENCOUNTER — Other Ambulatory Visit: Payer: Self-pay | Admitting: Medical

## 2017-08-12 ENCOUNTER — Telehealth: Payer: 59 | Admitting: Family

## 2017-08-12 DIAGNOSIS — N390 Urinary tract infection, site not specified: Secondary | ICD-10-CM

## 2017-08-12 MED ORDER — CEPHALEXIN 500 MG PO CAPS: 500.0000 mg | ORAL_CAPSULE | Freq: Two times a day (BID) | ORAL | 0 refills | Status: DC

## 2017-08-12 NOTE — Progress Notes (Signed)

## 2017-08-18 ENCOUNTER — Other Ambulatory Visit: Payer: Self-pay

## 2017-08-18 MED ORDER — ATORVASTATIN CALCIUM 10 MG PO TABS: 10.0000 mg | ORAL_TABLET | Freq: Every day | ORAL | 0 refills | Status: DC

## 2017-08-18 NOTE — Telephone Encounter (Signed)
Faxed refill of Lipitor to [facility]

## 2017-09-05 ENCOUNTER — Telehealth: Payer: Self-pay | Admitting: Medical

## 2017-09-05 NOTE — Telephone Encounter (Signed)
Pt dropped off a health form for Ann Frederick to complete, forms need to be faxed, documents placed in tray at front office

## 2017-09-09 ENCOUNTER — Telehealth: Payer: 59 | Admitting: Nurse Practitioner

## 2017-09-09 DIAGNOSIS — N3000 Acute cystitis without hematuria: Secondary | ICD-10-CM

## 2017-09-09 MED ORDER — CIPROFLOXACIN HCL 500 MG PO TABS: 500.0000 mg | ORAL_TABLET | Freq: Two times a day (BID) | ORAL | 0 refills | Status: DC

## 2017-09-09 NOTE — Progress Notes (Signed)

## 2017-09-09 NOTE — Telephone Encounter (Signed)
Completed as much as possible; called and spoke with patient about the need for her to sign the form prior to faxing, forwarded to provider for signature, then to front desk for patient to come in and sign before faxing/SLS 09/14

## 2017-09-12 NOTE — Telephone Encounter (Signed)
Pt came in office signed document and gave pt a copy, document was faxed with confirmation to fax number [phone]. Document sent to be scanned.

## 2017-10-01 ENCOUNTER — Other Ambulatory Visit: Payer: Self-pay | Admitting: Medical

## 2017-11-06 ENCOUNTER — Other Ambulatory Visit: Payer: Self-pay | Admitting: Medical

## 2017-11-06 DIAGNOSIS — E785 Hyperlipidemia, unspecified: Secondary | ICD-10-CM

## 2017-11-08 NOTE — Telephone Encounter (Signed)
Left pt a message notifying her lab orders are in and she just need to be put on lab schedule.

## 2017-11-08 NOTE — Telephone Encounter (Signed)
I am not sure that I want to refill her Lipitor just yet.  There is a slight chance I might increase the dose if her cholesterol has worsened.  Did you go ahead and send a prescription in?  Was the patient completely out of the Lipitor?

## 2017-11-08 NOTE — Telephone Encounter (Signed)
Patient scheduled for follow up for Friday 11/11/17 for follow up, patient would like pre visit labs prior to appointment. Patient would like to discuss results at appointment.

## 2017-11-08 NOTE — Telephone Encounter (Signed)
Pt is due for follow up please call and schedule appointment.  

## 2017-11-10 ENCOUNTER — Other Ambulatory Visit (INDEPENDENT_AMBULATORY_CARE_PROVIDER_SITE_OTHER): Payer: 59

## 2017-11-10 DIAGNOSIS — E785 Hyperlipidemia, unspecified: Secondary | ICD-10-CM

## 2017-11-10 LAB — LDL CHOLESTEROL, DIRECT: Direct LDL: 101 mg/dL

## 2017-11-10 LAB — LIPID PANEL
CHOL/HDL RATIO: 4
CHOLESTEROL: 193 mg/dL (ref 0–200)
HDL: 45.7 mg/dL (ref >39.00)
NONHDL: 147
TRIGLYCERIDES: 231 mg/dL — AB (ref 0.0–149.0)
VLDL: 46.2 mg/dL — AB (ref 0.0–40.0)

## 2017-11-10 LAB — COMPREHENSIVE METABOLIC PANEL
ALT: 14 U/L (ref 0–35)
AST: 15 U/L (ref 0–37)
Albumin: 3.9 g/dL (ref 3.5–5.2)
Alkaline Phosphatase: 53 U/L (ref 39–117)
BUN: 7 mg/dL (ref 6–23)
CALCIUM: 9.3 mg/dL (ref 8.4–10.5)
CHLORIDE: 101 meq/L (ref 96–112)
CO2: 29 meq/L (ref 19–32)
Creatinine, Ser: 0.9 mg/dL (ref 0.40–1.20)
GFR: 86.65 mL/min (ref >60.00)
Glucose, Bld: 107 mg/dL — ABNORMAL HIGH (ref 70–99)
POTASSIUM: 3.4 meq/L — AB (ref 3.5–5.1)
Sodium: 139 mEq/L (ref 135–145)
Total Bilirubin: 0.5 mg/dL (ref 0.2–1.2)
Total Protein: 6.7 g/dL (ref 6.0–8.3)

## 2017-11-11 ENCOUNTER — Encounter: Payer: Self-pay | Admitting: Medical

## 2017-11-11 ENCOUNTER — Ambulatory Visit (INDEPENDENT_AMBULATORY_CARE_PROVIDER_SITE_OTHER): Payer: 59 | Admitting: Medical

## 2017-11-11 VITALS — BP 136/89 | HR 98 | Temp 97.5°F | Resp 16 | Ht 62.0 in | Wt 182.8 lb

## 2017-11-11 DIAGNOSIS — E876 Hypokalemia: Secondary | ICD-10-CM | POA: Diagnosis not present

## 2017-11-11 DIAGNOSIS — Z23 Encounter for immunization: Secondary | ICD-10-CM | POA: Diagnosis not present

## 2017-11-11 DIAGNOSIS — E785 Hyperlipidemia, unspecified: Secondary | ICD-10-CM | POA: Diagnosis not present

## 2017-11-11 MED ORDER — FENOFIBRATE 48 MG PO TABS: 48.0000 mg | ORAL_TABLET | Freq: Every day | ORAL | 3 refills | Status: DC

## 2017-11-11 NOTE — Patient Instructions (Addendum)
For your high cholesterol with high triglycerides, I recommend low-dose fenofibrate.  Continue low-cholesterol diet and exercise.  Unfortunately the statin caused some muscle aches so DC Lipitor.  I do not think you have side effects with fenofibrate.  For very minimal borderline low potassium, I recommend she supplement her diet with high potassium foods.  We gave flu vaccine today.  Sugar was mildly elevated recently on labs so  recommend low sugar diet.  I would keep an eye on your pulse and blood pressure while you are on Adipex prescribed by your gynecologist.  Next time he sees gynecologist please have them fax over your most recent mammogram.  To follow-up in 3 months for fasting lipid panel or as needed.

## 2017-11-11 NOTE — Progress Notes (Signed)
Subjective:    Patient ID: Ann Frederick, female    DOB: 09-05-1971, 46 y.o.   MRN: [id number]  HPI  Pt in for follow up.  Pt triglycerides elevated recently. Total cholesterol lower recently and ldl. Pt willing to use fenofibrate.   Pt has mild low k but no cramps. Hx of mild low k.  Pt recent sugar mild elevated about 8 points but a1c last year 5.2.   Some recent working out.  Pt needs flu vaccine.  Pt had mammogram with Dr. Corinna Capra GYN.  Pt is on adipex for weight loss rx by her gyn    Review of Systems  Constitutional: Negative for chills, fatigue and fever.  HENT: Negative for congestion, ear pain, postnasal drip, rhinorrhea, sinus pressure and sinus pain.   Respiratory: Negative for cough, chest tightness, shortness of breath and wheezing.   Cardiovascular: Negative for chest pain and palpitations.  Gastrointestinal: Negative for abdominal pain and constipation.  Musculoskeletal: Negative for back pain.  Skin: Negative for color change and rash.  Neurological: Negative for dizziness and headaches.  Hematological: Negative for adenopathy. Does not bruise/bleed easily.  Psychiatric/Behavioral: Negative for agitation, confusion, dysphoric mood and suicidal ideas. The patient is not nervous/anxious.    Past Medical History:  Diagnosis Date  . Acne   . Anemia    history of  . Anxiety   . Depression   . Diabetes mellitus    gest this pregnancy  . GERD (gastroesophageal reflux disease)    worse while pregnant  . Headache    Migraines  . Heart murmur    ? heart murmur in past  . History of blood transfusion 2013  . Hypertension      Social History   Socioeconomic History  . Marital status: Married    Spouse name: Not on file  . Number of children: 2  . Years of education: Not on file  . Highest education level: Not on file  Social Needs  . Financial resource strain: Not on file  . Food insecurity - worry: Not on file  . Food insecurity - inability: Not  on file  . Transportation needs - medical: Not on file  . Transportation needs - non-medical: Not on file  Occupational History  . Not on file  Tobacco Use  . Smoking status: Never Smoker  . Smokeless tobacco: Never Used  Substance and Sexual Activity  . Alcohol use: Yes    Comment: 1 glass of wine per week prior to preg  . Drug use: No  . Sexual activity: Yes  Other Topics Concern  . Not on file  Social History Narrative  . Not on file    Past Surgical History:  Procedure Laterality Date  . CESAREAN SECTION     x 2  . CESAREAN SECTION  09/29/2012   Procedure: CESAREAN SECTION;  Surgeon: Luz Lex, MD;  Location: [facility];  Service: Obstetrics;  Laterality: N/A;  Repeat edc 10/12/12/REQUEST;Chassity,Dee,Colleen  . COLONOSCOPY    . ESOPHAGOGASTRODUODENOSCOPY  normal    7/12  . LAPAROSCOPIC VAGINAL HYSTERECTOMY WITH SALPINGECTOMY Bilateral 02/23/2016   Procedure: LAPAROSCOPIC ASSISTED VAGINAL HYSTERECTOMY WITH bilateral SALPINGECTOMY, right oophorectomy. laporotic repair of incidental cystotomy.;  Surgeon: Louretta Shorten, MD;  Location: [facility];  Service: Gynecology;  Laterality: Bilateral;  . MOUTH SURGERY      Family History  Problem Relation Age of Onset  . Depression Mother   . Hypertension Mother   . Diabetes Mother   . Obesity  Mother   . Thyroid disease Mother   . Cancer Father        ? lung CA  . Kidney disease Father   . Hydrocephalus Sister   . Colon cancer Neg Hx     Allergies  Allergen Reactions  . Azithromycin Nausea And Vomiting    diarrhea    Current Outpatient Medications on File Prior to Visit  Medication Sig Dispense Refill  . ALPRAZolam (XANAX) 0.5 MG tablet Take 0.5 mg by mouth 2 (two) times daily as needed for anxiety.     Marland Kitchen aspirin 81 MG tablet Take 81 mg by mouth daily.    Marland Kitchen b complex vitamins capsule Take 1 capsule by mouth daily.    Marland Kitchen buPROPion (WELLBUTRIN XL) 300 MG 24 hr tablet Take 1 tablet by mouth daily.  12  . Cholecalciferol (VITAMIN  D) 2000 units CAPS Take 2,000 Units by mouth daily.     . cyclobenzaprine (FLEXERIL) 5 MG tablet Take 1 tablet (5 mg total) by mouth at bedtime. 20 tablet 1  . FLUoxetine (PROZAC) 40 MG capsule Take 40 mg by mouth daily.     . hydrochlorothiazide (MICROZIDE) 12.5 MG capsule TAKE 1 CAPSULE BY MOUTH  DAILY 90 capsule 0  . phentermine (ADIPEX-P) 37.5 MG capsule Take 37.5 mg daily by mouth.    . potassium chloride (K-DUR) 10 MEQ tablet 1 tab po every other day 45 tablet 0  . zolpidem (AMBIEN) 10 MG tablet Take 10 mg by mouth at bedtime as needed for sleep.   5  . atorvastatin (LIPITOR) 10 MG tablet TAKE 1 TABLET BY MOUTH  DAILY (Patient not taking: Reported on 11/11/2017) 90 tablet 0  . [DISCONTINUED] pantoprazole (PROTONIX) 40 MG tablet Take 40 mg by mouth daily before breakfast.       No current facility-administered medications on file prior to visit.     BP 136/89   Pulse 98   Temp (!) 97.5 F (36.4 C) (Oral)   Resp 16   Ht 5\' 2"  (1.575 m)   Wt 182 lb 12.8 oz (82.9 kg)   LMP 02/11/2016 (Exact Date)   SpO2 100%   BMI 33.43 kg/m       Objective:   Physical Exam  General Mental Status- Alert. General Appearance- Not in acute distress.   Skin General: Color- Normal Color. Moisture- Normal Moisture.  Neck Carotid Arteries- Normal color. Moisture- Normal Moisture. No carotid bruits. No JVD.  Chest and Lung Exam Auscultation: Breath Sounds:-Normal.  Cardiovascular Auscultation:Rythm- Regular. Murmurs & Other Heart Sounds:Auscultation of the heart reveals- No Murmurs.  Abdomen Inspection:-Inspeection Normal. Palpation/Percussion:Note:No mass. Palpation and Percussion of the abdomen reveal- Non Tender, Non Distended + BS, no rebound or guarding.   Neurologic Cranial Nerve exam:- CN III-XII intact(No nystagmus), symmetric smile. Strength:- 5/5 equal and symmetric strength both upper and lower extremities.      Assessment & Plan:  For your high cholesterol with high  triglycerides, I recommend low-dose fenofibrate.  Continue low-cholesterol diet and exercise.  Unfortunately the statin caused some muscle aches so DC Lipitor.  I do not think you have side effects with fenofibrate.  For very minimal borderline low potassium, I recommend she supplement her diet with high potassium foods.  We gave flu vaccine today.  Sugar was mildly elevated recently on labs so  recommend low sugar diet.  I would keep an eye on your pulse and blood pressure while you are on Adipex prescribed by your gynecologist.  Next time he  sees gynecologist please have them fax over your most recent mammogram.  To follow-up in 3 months for fasting lipid panel or as needed.  Tillie Viverette, [name], PA-C

## 2017-12-22 ENCOUNTER — Other Ambulatory Visit: Payer: Self-pay | Admitting: Medical

## 2018-01-13 ENCOUNTER — Telehealth: Payer: 59 | Admitting: Family

## 2018-01-13 DIAGNOSIS — R399 Unspecified symptoms and signs involving the genitourinary system: Secondary | ICD-10-CM

## 2018-01-13 MED ORDER — CEPHALEXIN 500 MG PO CAPS: 500.0000 mg | ORAL_CAPSULE | Freq: Two times a day (BID) | ORAL | 0 refills | Status: DC

## 2018-01-13 NOTE — Progress Notes (Signed)

## 2018-01-23 ENCOUNTER — Other Ambulatory Visit: Payer: Self-pay | Admitting: Medical

## 2018-02-07 ENCOUNTER — Ambulatory Visit (HOSPITAL_BASED_OUTPATIENT_CLINIC_OR_DEPARTMENT_OTHER)
Admission: RE | Admit: 2018-02-07 | Discharge: 2018-02-07 | Disposition: A | Discharge status: Home / Self Care | Payer: BLUE CROSS/BLUE SHIELD | Source: Ambulatory Visit | Attending: Medical | Admitting: Medical

## 2018-02-07 ENCOUNTER — Ambulatory Visit (INDEPENDENT_AMBULATORY_CARE_PROVIDER_SITE_OTHER): Payer: BLUE CROSS/BLUE SHIELD | Admitting: Medical

## 2018-02-07 ENCOUNTER — Ambulatory Visit: Payer: Self-pay | Admitting: Medical

## 2018-02-07 ENCOUNTER — Telehealth: Payer: Self-pay | Admitting: Medical

## 2018-02-07 ENCOUNTER — Encounter: Payer: Self-pay | Admitting: Medical

## 2018-02-07 VITALS — BP 128/89 | HR 104 | Temp 98.4°F | Resp 16 | Ht 62.0 in | Wt 187.8 lb

## 2018-02-07 DIAGNOSIS — R109 Unspecified abdominal pain: Secondary | ICD-10-CM | POA: Diagnosis not present

## 2018-02-07 DIAGNOSIS — R739 Hyperglycemia, unspecified: Secondary | ICD-10-CM

## 2018-02-07 DIAGNOSIS — M4807 Spinal stenosis, lumbosacral region: Secondary | ICD-10-CM | POA: Insufficient documentation

## 2018-02-07 DIAGNOSIS — R1032 Left lower quadrant pain: Secondary | ICD-10-CM

## 2018-02-07 LAB — COMPREHENSIVE METABOLIC PANEL
ALBUMIN: 3.9 g/dL (ref 3.5–5.2)
ALT: 20 U/L (ref 0–35)
AST: 17 U/L (ref 0–37)
Alkaline Phosphatase: 53 U/L (ref 39–117)
BILIRUBIN TOTAL: 0.5 mg/dL (ref 0.2–1.2)
BUN: 8 mg/dL (ref 6–23)
CALCIUM: 9 mg/dL (ref 8.4–10.5)
CHLORIDE: 103 meq/L (ref 96–112)
CO2: 28 meq/L (ref 19–32)
CREATININE: 0.8 mg/dL (ref 0.40–1.20)
GFR: 99.16 mL/min (ref >60.00)
Glucose, Bld: 122 mg/dL — ABNORMAL HIGH (ref 70–99)
Potassium: 4 mEq/L (ref 3.5–5.1)
SODIUM: 139 meq/L (ref 135–145)
Total Protein: 6.6 g/dL (ref 6.0–8.3)

## 2018-02-07 LAB — CBC WITH DIFFERENTIAL/PLATELET
BASOS ABS: 0.1 10*3/uL (ref 0.0–0.1)
Basophils Relative: 0.7 % (ref 0.0–3.0)
EOS ABS: 0.2 10*3/uL (ref 0.0–0.7)
Eosinophils Relative: 1.9 % (ref 0.0–5.0)
HEMATOCRIT: 38.2 % (ref 36.0–46.0)
HEMOGLOBIN: 12.7 g/dL (ref 12.0–15.0)
LYMPHS PCT: 24.7 % (ref 12.0–46.0)
Lymphs Abs: 2.8 10*3/uL (ref 0.7–4.0)
MCHC: 33.3 g/dL (ref 30.0–36.0)
MCV: 94.6 fl (ref 78.0–100.0)
MONO ABS: 0.6 10*3/uL (ref 0.1–1.0)
Monocytes Relative: 4.9 % (ref 3.0–12.0)
Neutro Abs: 7.7 10*3/uL (ref 1.4–7.7)
Neutrophils Relative %: 67.8 % (ref 43.0–77.0)
PLATELETS: 341 10*3/uL (ref 150.0–400.0)
RBC: 4.04 Mil/uL (ref 3.87–5.11)
RDW: 13.1 % (ref 11.5–15.5)
WBC: 11.4 10*3/uL — AB (ref 4.0–10.5)

## 2018-02-07 LAB — POC URINALSYSI DIPSTICK (AUTOMATED)
Bilirubin, UA: NEGATIVE
Ketones, UA: NEGATIVE
Leukocytes, UA: NEGATIVE
NITRITE UA: NEGATIVE
PH UA: 6.5 (ref 5.0–8.0)
Protein, UA: NEGATIVE
RBC UA: NEGATIVE
SPEC GRAV UA: 1.015 (ref 1.010–1.025)
UROBILINOGEN UA: 0.2 U/dL

## 2018-02-07 MED ORDER — DICLOFENAC SODIUM 75 MG PO TBEC: 75.0000 mg | DELAYED_RELEASE_TABLET | Freq: Two times a day (BID) | ORAL | 0 refills | Status: DC

## 2018-02-07 MED ORDER — CLINDAMYCIN HCL 150 MG PO CAPS: 150.0000 mg | ORAL_CAPSULE | Freq: Three times a day (TID) | ORAL | 0 refills | Status: DC

## 2018-02-07 MED ORDER — IOPAMIDOL (ISOVUE-300) INJECTION 61%
100.0000 mL | Freq: Once | INTRAVENOUS | Status: AC | PRN
  Administered 2018-02-07: 100 mL via INTRAVENOUS

## 2018-02-07 MED ORDER — KETOROLAC TROMETHAMINE 60 MG/2ML IM SOLN
60.0000 mg | Freq: Once | INTRAMUSCULAR | Status: AC
  Administered 2018-02-07: 60 mg via INTRAMUSCULAR

## 2018-02-07 MED ORDER — KETOROLAC TROMETHAMINE 60 MG/2ML IM SOLN: 60.0000 mg | Freq: Once | INTRAMUSCULAR | Status: DC

## 2018-02-07 NOTE — Telephone Encounter (Signed)
Future A1c place.

## 2018-02-07 NOTE — Patient Instructions (Addendum)
You had intermittent episodes of moderate to severe pain in the left side abdomen region.  Pain most prominent and constant in the left lower quadrant.  Due to the high level of pain today approximately 7 out of 10 we will get CBC and metabolic panel.  In addition I placed an order for CT of the abdomen and pelvis stat.  Since your urine is clear showing no blood will evaluate abdomen pelvis for diverticulitis.  This may give [place] some information regarding your left ovary as well.  Hopefully your insurance will authorize a CT and you can get the test done today. Consider getting prescription of Cipro and Flagyl but want to evaluate CT results first.   We did give Toradol 60 mg IM in the office with the hopes of giving you pain.  If no diagnosis found but pain persists then I would recommend you getting the appointment bumped up with gastroenterology.  Also CT does not reveal cause and ovary is not well visualized and would consider getting pelvic ultrasound.  Also keep him on his you mention pain could be an adhesion type pain but we need to do the above workup before considering that as possible.  Follow-up in 3-5 days or as needed.

## 2018-02-07 NOTE — Progress Notes (Signed)
Subjective:    Patient ID: Ann Frederick, female    DOB: September 08, 1971, 47 y.o.   MRN: [id number]  HPI  Pt in with on and off pain lower abdomen area for 6 months. Pain has been constant for 2 weeks.   In the past at onset of pain she describes pain as if she can feel food moving through her digestive tract.(describes pain more left flank area but worse in llq region)  She point left upper quadrant region, left flank area and pain in left lower quadrant. The pain in let lower quadrant is the area of worse pain. In this area feels sharp, poking pain.   Pt does not report change of stool pattern. Some time will have rabbit pellet sized stools and at other times loose stools.   Pt had 3 c-sections, and had hysterectomy. Bladder cut at time of hysterectomy. But bladder repaired at that time with using trochanter. Pt still has ovary on left side.  Last 3 days pain increased. Pain level 7/10.  At times over the last 3 days some pain that increases to 10 out of 10.   Pt had colonoscopy may 2016. Study was negative.  Pt GI MD Dr. Ardis Hughs. Pt called and told can see him in April 2019.    Review of Systems  Constitutional: Negative for chills, fatigue and fever.  Respiratory: Negative for cough, chest tightness, shortness of breath and wheezing.   Cardiovascular: Negative for chest pain and palpitations.  Gastrointestinal: Positive for abdominal pain. Negative for anal bleeding, blood in stool, nausea and vomiting.  Musculoskeletal: Negative for arthralgias, back pain, joint swelling, myalgias and neck stiffness.  Skin: Negative for rash.  Neurological: Negative for dizziness, speech difficulty, weakness, light-headedness, numbness and headaches.  Hematological: Negative for adenopathy. Does not bruise/bleed easily.  Psychiatric/Behavioral: Negative for behavioral problems and confusion.   Past Medical History:  Diagnosis Date  . Acne   . Anemia    history of  . Anxiety   . Depression   .  Diabetes mellitus    gest this pregnancy  . GERD (gastroesophageal reflux disease)    worse while pregnant  . Headache    Migraines  . Heart murmur    ? heart murmur in past  . History of blood transfusion 2013  . Hypertension      Social History   Socioeconomic History  . Marital status: Married    Spouse name: Not on file  . Number of children: 2  . Years of education: Not on file  . Highest education level: Not on file  Social Needs  . Financial resource strain: Not on file  . Food insecurity - worry: Not on file  . Food insecurity - inability: Not on file  . Transportation needs - medical: Not on file  . Transportation needs - non-medical: Not on file  Occupational History  . Not on file  Tobacco Use  . Smoking status: Never Smoker  . Smokeless tobacco: Never Used  Substance and Sexual Activity  . Alcohol use: Yes    Comment: 1 glass of wine per week prior to preg  . Drug use: No  . Sexual activity: Yes  Other Topics Concern  . Not on file  Social History Narrative  . Not on file    Past Surgical History:  Procedure Laterality Date  . CESAREAN SECTION     x 2  . CESAREAN SECTION  09/29/2012   Procedure: CESAREAN SECTION;  Surgeon: Monia Sabal  Corinna Capra, MD;  Location: [facility];  Service: Obstetrics;  Laterality: N/A;  Repeat edc 10/12/12/REQUEST;Chassity,Dee,Colleen  . COLONOSCOPY    . ESOPHAGOGASTRODUODENOSCOPY  normal    7/12  . LAPAROSCOPIC VAGINAL HYSTERECTOMY WITH SALPINGECTOMY Bilateral 02/23/2016   Procedure: LAPAROSCOPIC ASSISTED VAGINAL HYSTERECTOMY WITH bilateral SALPINGECTOMY, right oophorectomy. laporotic repair of incidental cystotomy.;  Surgeon: Louretta Shorten, MD;  Location: [facility];  Service: Gynecology;  Laterality: Bilateral;  . MOUTH SURGERY      Family History  Problem Relation Age of Onset  . Depression Mother   . Hypertension Mother   . Diabetes Mother   . Obesity Mother   . Thyroid disease Mother   . Cancer Father        ? lung CA  . Kidney  disease Father   . Hydrocephalus Sister   . Colon cancer Neg Hx     Allergies  Allergen Reactions  . Azithromycin Nausea And Vomiting    diarrhea    Current Outpatient Medications on File Prior to Visit  Medication Sig Dispense Refill  . ALPRAZolam (XANAX) 0.5 MG tablet Take 0.5 mg by mouth 2 (two) times daily as needed for anxiety.     Marland Kitchen aspirin 81 MG tablet Take 81 mg by mouth daily.    Marland Kitchen b complex vitamins capsule Take 1 capsule by mouth daily.    Marland Kitchen buPROPion (WELLBUTRIN XL) 300 MG 24 hr tablet Take 1 tablet by mouth daily.  12  . cephALEXin (KEFLEX) 500 MG capsule Take 1 capsule (500 mg total) by mouth 2 (two) times daily. 14 capsule 0  . Cholecalciferol (VITAMIN D) 2000 units CAPS Take 2,000 Units by mouth daily.     . cyclobenzaprine (FLEXERIL) 5 MG tablet Take 1 tablet (5 mg total) by mouth at bedtime. 20 tablet 1  . FLUoxetine (PROZAC) 40 MG capsule Take 40 mg by mouth daily.     . hydrochlorothiazide (MICROZIDE) 12.5 MG capsule TAKE 1 CAPSULE BY MOUTH  DAILY 90 capsule 0  . potassium chloride (K-DUR) 10 MEQ tablet 1 tab po every other day 45 tablet 0  . zolpidem (AMBIEN) 10 MG tablet Take 10 mg by mouth at bedtime as needed for sleep.   5  . atorvastatin (LIPITOR) 10 MG tablet TAKE 1 TABLET BY MOUTH  DAILY (Patient not taking: Reported on 02/07/2018) 90 tablet 0  . fenofibrate (TRICOR) 48 MG tablet Take 1 tablet (48 mg total) daily by mouth. (Patient not taking: Reported on 02/07/2018) 90 tablet 3  . [DISCONTINUED] pantoprazole (PROTONIX) 40 MG tablet Take 40 mg by mouth daily before breakfast.       No current facility-administered medications on file prior to visit.     BP 128/89   Pulse (!) 104   Temp 98.4 F (36.9 C) (Oral)   Resp 16   Ht 5\' 2"  (1.575 m)   Wt 187 lb 12.8 oz (85.2 kg)   LMP 02/11/2016 (Exact Date)   SpO2 99%   BMI 34.35 kg/m       Objective:   Physical Exam  General Appearance- Not in acute distress.  HEENT Eyes-  Scleraeral/Conjuntiva-bilat- Not Yellow. Mouth & Throat- Normal.  Chest and Lung Exam Auscultation: Breath sounds:-Normal. Adventitious sounds:- No Adventitious sounds.  Cardiovascular Auscultation:Rythm - Regular. Heart Sounds -Normal heart sounds.  Abdomen Inspection:-Inspection Normal.  Palpation/Perucssion: Palpation and Percussion of the abdomen reveal-  Left lower quadrant Tender with some  Rebound tenderness on deep palpation(checked twice second time severe pain), No rigidity(Guarding) and No Palpable  abdominal masses.  Liver:-Normal.  Spleen:- Normal.   Back-no cva tenderness      Assessment & Plan:  You had intermittent episodes of moderate to severe pain in the left side abdomen region.  Pain most prominent and constant in the left lower quadrant.  Due to the high level of pain today approximately 7 out of 10 we will get CBC and metabolic panel.  In addition I placed an order for CT of the abdomen and pelvis stat.  Since your urine is clear showing no blood will evaluate abdomen pelvis for diverticulitis.  This may give [place] some information regarding your left ovary as well.  Hopefully your insurance will authorize a CT and you can get the test done today. Consider getting prescription of Cipro and Flagyl but want to evaluate CT results first.   We did give Toradol 60 mg IM in the office with the hopes of giving you pain.  If no diagnosis found but pain persists then I would recommend you getting the appointment bumped up with gastroenterology.  Also CT does not reveal cause and ovary is not well visualized and would consider getting pelvic ultrasound.  Also keep him on his you mention pain could be an adhesion type pain but we need to do the above workup before considering that as possible.  Follow-up in 3-5 days or as needed.  Mackie Pai, PA-C

## 2018-02-07 NOTE — Telephone Encounter (Signed)
Prescription of diclofenac and clindamycin sent to patient's pharmacy

## 2018-02-08 ENCOUNTER — Telehealth: Payer: Self-pay | Admitting: Medical

## 2018-02-08 ENCOUNTER — Other Ambulatory Visit (INDEPENDENT_AMBULATORY_CARE_PROVIDER_SITE_OTHER): Payer: BLUE CROSS/BLUE SHIELD

## 2018-02-08 ENCOUNTER — Telehealth: Payer: Self-pay | Admitting: *Deleted

## 2018-02-08 DIAGNOSIS — R739 Hyperglycemia, unspecified: Secondary | ICD-10-CM | POA: Diagnosis not present

## 2018-02-08 DIAGNOSIS — R102 Pelvic and perineal pain: Secondary | ICD-10-CM

## 2018-02-08 DIAGNOSIS — N83209 Unspecified ovarian cyst, unspecified side: Secondary | ICD-10-CM

## 2018-02-08 LAB — HEMOGLOBIN A1C: Hgb A1c MFr Bld: 5.1 % (ref 4.6–6.5)

## 2018-02-08 NOTE — Telephone Encounter (Signed)
Faxed add on for HgbA1c.  Confirmation received.//AB/CMA

## 2018-02-08 NOTE — Telephone Encounter (Signed)
Called pt to confirm lab appt and she stated that she was here yesterday and had labs done, but requested a call from the [facility] about antibiotic prescriptions being called in.    CB: [phone]

## 2018-02-08 NOTE — Telephone Encounter (Signed)
Referral to gynecologist placed.  Please see if you can get patient in with her gynecologist.

## 2018-02-08 NOTE — Addendum Note (Signed)
Addended by: Harl Bowie on: 02/08/2018 01:57 PM   Modules accepted: Orders

## 2018-02-08 NOTE — Telephone Encounter (Signed)
I called in the antibiotic late last night.  It was clindamycin.  However she does have appointment with gynecologist tomorrow morning.  So by the time she sees them they might have different opinion on course of treatment.

## 2018-02-08 NOTE — Telephone Encounter (Signed)
-----   Message from Mackie Pai, PA-C sent at 02/07/2018  5:39 PM EST ----- I placed an A1c today as future after I saw her high sugar levels metabolic panel.  Can you pull that and contact the lab.  She also had CBC done yesterday thanks

## 2018-02-09 ENCOUNTER — Other Ambulatory Visit: Payer: 59

## 2018-02-09 DIAGNOSIS — N83202 Unspecified ovarian cyst, left side: Secondary | ICD-10-CM | POA: Diagnosis not present

## 2018-02-09 DIAGNOSIS — N739 Female pelvic inflammatory disease, unspecified: Secondary | ICD-10-CM | POA: Diagnosis not present

## 2018-02-13 ENCOUNTER — Ambulatory Visit (INDEPENDENT_AMBULATORY_CARE_PROVIDER_SITE_OTHER): Payer: BLUE CROSS/BLUE SHIELD | Admitting: Medical

## 2018-02-13 ENCOUNTER — Ambulatory Visit (HOSPITAL_BASED_OUTPATIENT_CLINIC_OR_DEPARTMENT_OTHER)
Admission: RE | Admit: 2018-02-13 | Discharge: 2018-02-13 | Disposition: A | Discharge status: Home / Self Care | Payer: BLUE CROSS/BLUE SHIELD | Source: Ambulatory Visit | Attending: Medical | Admitting: Medical

## 2018-02-13 ENCOUNTER — Encounter (HOSPITAL_BASED_OUTPATIENT_CLINIC_OR_DEPARTMENT_OTHER): Payer: Self-pay

## 2018-02-13 ENCOUNTER — Encounter: Payer: Self-pay | Admitting: Medical

## 2018-02-13 VITALS — BP 138/90 | HR 94 | Temp 98.1°F | Resp 16 | Ht 62.0 in | Wt 191.6 lb

## 2018-02-13 DIAGNOSIS — N83292 Other ovarian cyst, left side: Secondary | ICD-10-CM | POA: Diagnosis not present

## 2018-02-13 DIAGNOSIS — E669 Obesity, unspecified: Secondary | ICD-10-CM

## 2018-02-13 DIAGNOSIS — Z90710 Acquired absence of both cervix and uterus: Secondary | ICD-10-CM | POA: Diagnosis not present

## 2018-02-13 DIAGNOSIS — R188 Other ascites: Secondary | ICD-10-CM | POA: Insufficient documentation

## 2018-02-13 DIAGNOSIS — R102 Pelvic and perineal pain: Secondary | ICD-10-CM | POA: Diagnosis not present

## 2018-02-13 DIAGNOSIS — N732 Unspecified parametritis and pelvic cellulitis: Secondary | ICD-10-CM | POA: Insufficient documentation

## 2018-02-13 LAB — CBC WITH DIFFERENTIAL/PLATELET
Basophils Absolute: 0.1 10*3/uL (ref 0.0–0.1)
Basophils Relative: 0.7 % (ref 0.0–3.0)
EOS PCT: 2 % (ref 0.0–5.0)
Eosinophils Absolute: 0.2 10*3/uL (ref 0.0–0.7)
HEMATOCRIT: 38.2 % (ref 36.0–46.0)
HEMOGLOBIN: 12.6 g/dL (ref 12.0–15.0)
LYMPHS PCT: 21.9 % (ref 12.0–46.0)
Lymphs Abs: 2.2 10*3/uL (ref 0.7–4.0)
MCHC: 33.1 g/dL (ref 30.0–36.0)
MCV: 95.3 fl (ref 78.0–100.0)
MONOS PCT: 4.6 % (ref 3.0–12.0)
Monocytes Absolute: 0.5 10*3/uL (ref 0.1–1.0)
Neutro Abs: 7.1 10*3/uL (ref 1.4–7.7)
Neutrophils Relative %: 70.8 % (ref 43.0–77.0)
Platelets: 327 10*3/uL (ref 150.0–400.0)
RBC: 4.01 Mil/uL (ref 3.87–5.11)
RDW: 13.6 % (ref 11.5–15.5)
WBC: 10 10*3/uL (ref 4.0–10.5)

## 2018-02-13 MED ORDER — LIRAGLUTIDE -WEIGHT MANAGEMENT 18 MG/3ML ~~LOC~~ SOPN: 0.6000 mg | PEN_INJECTOR | Freq: Every day | SUBCUTANEOUS | 1 refills | Status: DC

## 2018-02-13 NOTE — Progress Notes (Signed)
Subjective:    Patient ID: Ann Frederick, female    DOB: June 09, 1971, 47 y.o.   MRN: [id number]  HPI  Pt in for follow up.  Pt did see the gynecologist. Pt states gyn did not do pelvic ultrasound. Pt was given doxycycline rather than the clindamycin I gave. Pt was treated for PID.(she denies any extramarital relations). Told NP at gyn office. No testing. Pt was also given rocephin that day.  Pt not having any fevers chills or sweats. She feels run down and tired. She still has tenderness left lower quadrant region. Pt has been on doxy since Thursday.  Pt will see Dr Corinna Capra in 2 weeks for follow up.    Review of Systems  Constitutional: Positive for fatigue. Negative for chills and fever.  HENT: Negative for congestion, ear discharge and facial swelling.   Respiratory: Negative for cough, chest tightness, shortness of breath and wheezing.   Gastrointestinal: Positive for abdominal pain. Negative for abdominal distention.  Musculoskeletal: Negative for back pain, gait problem and joint swelling.  Skin: Negative for rash.  Neurological: Negative for dizziness, seizures, speech difficulty, weakness, light-headedness and headaches.  Hematological: Negative for adenopathy. Does not bruise/bleed easily.  Psychiatric/Behavioral: Negative for behavioral problems and confusion.     Past Medical History:  Diagnosis Date  . Acne   . Anemia    history of  . Anxiety   . Depression   . Diabetes mellitus    gest this pregnancy  . GERD (gastroesophageal reflux disease)    worse while pregnant  . Headache    Migraines  . Heart murmur    ? heart murmur in past  . History of blood transfusion 2013  . Hypertension      Social History   Socioeconomic History  . Marital status: Married    Spouse name: Not on file  . Number of children: 2  . Years of education: Not on file  . Highest education level: Not on file  Social Needs  . Financial resource strain: Not on file  . Food  insecurity - worry: Not on file  . Food insecurity - inability: Not on file  . Transportation needs - medical: Not on file  . Transportation needs - non-medical: Not on file  Occupational History  . Not on file  Tobacco Use  . Smoking status: Never Smoker  . Smokeless tobacco: Never Used  Substance and Sexual Activity  . Alcohol use: Yes    Comment: 1 glass of wine per week prior to preg  . Drug use: No  . Sexual activity: Yes  Other Topics Concern  . Not on file  Social History Narrative  . Not on file    Past Surgical History:  Procedure Laterality Date  . CESAREAN SECTION     x 2  . CESAREAN SECTION  09/29/2012   Procedure: CESAREAN SECTION;  Surgeon: Luz Lex, MD;  Location: [facility];  Service: Obstetrics;  Laterality: N/A;  Repeat edc 10/12/12/REQUEST;Chassity,Dee,Colleen  . COLONOSCOPY    . ESOPHAGOGASTRODUODENOSCOPY  normal    7/12  . LAPAROSCOPIC VAGINAL HYSTERECTOMY WITH SALPINGECTOMY Bilateral 02/23/2016   Procedure: LAPAROSCOPIC ASSISTED VAGINAL HYSTERECTOMY WITH bilateral SALPINGECTOMY, right oophorectomy. laporotic repair of incidental cystotomy.;  Surgeon: Louretta Shorten, MD;  Location: [facility];  Service: Gynecology;  Laterality: Bilateral;  . MOUTH SURGERY      Family History  Problem Relation Age of Onset  . Depression Mother   . Hypertension Mother   . Diabetes Mother   .  Obesity Mother   . Thyroid disease Mother   . Cancer Father        ? lung CA  . Kidney disease Father   . Hydrocephalus Sister   . Colon cancer Neg Hx     Allergies  Allergen Reactions  . Azithromycin Nausea And Vomiting    diarrhea    Current Outpatient Medications on File Prior to Visit  Medication Sig Dispense Refill  . ALPRAZolam (XANAX) 0.5 MG tablet Take 0.5 mg by mouth 2 (two) times daily as needed for anxiety.     Marland Kitchen aspirin 81 MG tablet Take 81 mg by mouth daily.    Marland Kitchen atorvastatin (LIPITOR) 10 MG tablet TAKE 1 TABLET BY MOUTH  DAILY 90 tablet 0  . b complex vitamins  capsule Take 1 capsule by mouth daily.    Marland Kitchen buPROPion (WELLBUTRIN XL) 300 MG 24 hr tablet Take 1 tablet by mouth daily.  12  . cephALEXin (KEFLEX) 500 MG capsule Take 1 capsule (500 mg total) by mouth 2 (two) times daily. 14 capsule 0  . Cholecalciferol (VITAMIN D) 2000 units CAPS Take 2,000 Units by mouth daily.     . clindamycin (CLEOCIN) 150 MG capsule Take 1 capsule (150 mg total) by mouth 3 (three) times daily. 21 capsule 0  . cyclobenzaprine (FLEXERIL) 5 MG tablet Take 1 tablet (5 mg total) by mouth at bedtime. 20 tablet 1  . diclofenac (VOLTAREN) 75 MG EC tablet Take 1 tablet (75 mg total) by mouth 2 (two) times daily. 20 tablet 0  . fenofibrate (TRICOR) 48 MG tablet Take 1 tablet (48 mg total) daily by mouth. 90 tablet 3  . FLUoxetine (PROZAC) 40 MG capsule Take 40 mg by mouth daily.     . hydrochlorothiazide (MICROZIDE) 12.5 MG capsule TAKE 1 CAPSULE BY MOUTH  DAILY 90 capsule 0  . potassium chloride (K-DUR) 10 MEQ tablet 1 tab po every other day 45 tablet 0  . zolpidem (AMBIEN) 10 MG tablet Take 10 mg by mouth at bedtime as needed for sleep.   5  . [DISCONTINUED] pantoprazole (PROTONIX) 40 MG tablet Take 40 mg by mouth daily before breakfast.       No current facility-administered medications on file prior to visit.     BP 138/90   Pulse 94   Temp 98.1 F (36.7 C) (Oral)   Resp 16   Ht 5\' 2"  (1.575 m)   Wt 191 lb 9.6 oz (86.9 kg)   LMP 02/11/2016 (Exact Date)   SpO2 99%   BMI 35.04 kg/m       Objective:   Physical Exam  General Appearance- Not in acute distress.  HEENT Eyes- Scleraeral/Conjuntiva-bilat- Not Yellow. Mouth & Throat- Normal.  Chest and Lung Exam Auscultation: Breath sounds:-Normal. Adventitious sounds:- No Adventitious sounds.  Cardiovascular Auscultation:Rythm - Regular. Heart Sounds -Normal heart sounds.  Abdomen Inspection:-Inspection Normal.  Palpation/Perucssion: Palpation and Percussion of the abdomen reveal- suprapubic and left adnexal   Tender, No Rebound tenderness, No rigidity(Guarding) and No Palpable abdominal masses.  Liver:-Normal.  Spleen:- Normal.   Back- no cva tenderness.        Assessment & Plan:  Your pelvic ultrasound is scheduled for 1:30 today.  Make sure you have a full bladder. Also get cbc today.  Continue doxycycline antibiotic.   For weight loss saxenda.Rx given today.  Follow up in one month or sooner depending on [place] results and how you do clinically.  Mackie Pai, PA-C

## 2018-02-13 NOTE — Patient Instructions (Addendum)
Your pelvic ultrasound is scheduled for 1:30 today.  Make sure you have a full bladder. Also get cbc today.  Continue doxycycline antibiotic.   For weight loss saxenda.Rx given today.  Follow up in one month or sooner depending on [place] results and how you do clinically.

## 2018-02-14 ENCOUNTER — Telehealth: Payer: Self-pay

## 2018-02-14 NOTE — Telephone Encounter (Signed)
Let patient know that the saxenda was not covered/denied.  So in light of that she could see the weight loss specialist in [facility] or I could refer to [place] weight loss specialist.  Let me know when she wants.

## 2018-02-14 NOTE — Telephone Encounter (Signed)
P.A. denied, plan exclusion  

## 2018-02-14 NOTE — Telephone Encounter (Signed)
PA initiated via Covermymeds; KEY: [id number]. Awaiting determination.

## 2018-02-15 NOTE — Telephone Encounter (Signed)
Left pt a message to call back. 

## 2018-02-16 ENCOUNTER — Ambulatory Visit: Payer: Self-pay | Admitting: Physician Assistant

## 2018-02-20 ENCOUNTER — Encounter: Payer: Self-pay | Admitting: Medical

## 2018-02-20 DIAGNOSIS — R1032 Left lower quadrant pain: Secondary | ICD-10-CM | POA: Diagnosis not present

## 2018-02-20 DIAGNOSIS — N83202 Unspecified ovarian cyst, left side: Secondary | ICD-10-CM | POA: Diagnosis not present

## 2018-02-20 NOTE — Telephone Encounter (Signed)
Patient returning a call from [place] about [place] for Saxenda.

## 2018-02-22 ENCOUNTER — Encounter: Payer: Self-pay | Admitting: Medical

## 2018-02-22 ENCOUNTER — Ambulatory Visit (INDEPENDENT_AMBULATORY_CARE_PROVIDER_SITE_OTHER): Payer: BLUE CROSS/BLUE SHIELD | Admitting: Medical

## 2018-02-22 VITALS — BP 151/91 | HR 99 | Temp 97.6°F | Resp 16 | Ht 62.0 in | Wt 184.8 lb

## 2018-02-22 DIAGNOSIS — R1032 Left lower quadrant pain: Secondary | ICD-10-CM | POA: Diagnosis not present

## 2018-02-22 LAB — POC URINALSYSI DIPSTICK (AUTOMATED)
BILIRUBIN UA: NEGATIVE
GLUCOSE UA: NEGATIVE
Ketones, UA: NEGATIVE
Leukocytes, UA: NEGATIVE
NITRITE UA: NEGATIVE
PH UA: 6 (ref 5.0–8.0)
Protein, UA: NEGATIVE
RBC UA: NEGATIVE
Spec Grav, UA: >=1.03 — AB (ref 1.010–1.025)
Urobilinogen, UA: NEGATIVE E.U./dL — AB

## 2018-02-22 MED ORDER — HYDROCODONE-ACETAMINOPHEN 5-325 MG PO TABS: 1.0000 | ORAL_TABLET | Freq: Four times a day (QID) | ORAL | 0 refills | Status: DC | PRN

## 2018-02-22 NOTE — Patient Instructions (Signed)
Your persisting left lower quadrant/adnexal region pain may be related to ovarian cyst and a recent rupture.  However we do have the CT recently that mentioned abdominal wall abscess appearance.  You have also been on recent antibiotics and still have some symptoms.  Your urine was clear today.  No blood seen.  I want to get CBC to assess your WBC count and get metabolic panel as well.  Please return in Ifob test tomorrow.  If that test were to show blood then would likely refer you to gastroenterologist.  If that test is negative will discuss your recent presentation with MD in our office.  Might even reach out again to radiologist to see if they recommend further imaging such as repeat CT or may be MRI.  Presently prescribing Norco to help with the intermittent high level pain you described.  You can continue either ibuprofen or Aleve as well.  Follow-up in 7 days or as needed.

## 2018-02-22 NOTE — Telephone Encounter (Signed)
Duplicate MyChart message- see 02/21/2018.

## 2018-02-22 NOTE — Progress Notes (Signed)
Subjective:    Patient ID: Ann Frederick, female    DOB: 1971/08/18, 47 y.o.   MRN: [id number]  HPI  Pt in for follow up.  She had probable rupture ovarian cyst as partial cause of her lower abdomen pain/pelvic area pain on recent US. CT abd/pelvis noted possible pelvic wall/abdomen wall abscess. Her NP GYN gave rocephin and doxycyclne for possible PID.  Pt wonders if pain is gas.  Pt saw Dr. Budd Palmer on Monday. Recently on Sunday and Monday no pain but Tuesday pain returned. Pain about 7-8/10.  Pt has hx of fibroid after she had hysterectomy.   Prior CT report stated. Subcutaneous thickening bilaterally along the lower abdomen and upper pelvic wall, likely due to lower abdominal wall cellulitis. No abscess or fluid seen in this area.  She has no black or bloody stools.  Pt has tried ibuprofen and helped. Pt did not take clindamycin.   Pt had colonoscopy in 2016.     Review of Systems  Constitutional: Negative for chills, fatigue and fever.  Respiratory: Negative for cough, choking, chest tightness and wheezing.   Cardiovascular: Negative for palpitations.  Gastrointestinal: Positive for abdominal pain. Negative for abdominal distention, constipation, diarrhea, nausea and rectal pain.       See hpi.  Genitourinary: Negative for difficulty urinating, dysuria, frequency, hematuria and urgency.  Musculoskeletal: Negative for back pain.  Skin: Negative for rash.  Neurological: Negative for dizziness and headaches.  Hematological: Negative for adenopathy. Does not bruise/bleed easily.  Psychiatric/Behavioral: Negative for behavioral problems and confusion.    Past Medical History:  Diagnosis Date  . Acne   . Anemia    history of  . Anxiety   . Depression   . Diabetes mellitus    gest this pregnancy  . GERD (gastroesophageal reflux disease)    worse while pregnant  . Headache    Migraines  . Heart murmur    ? heart murmur in past  . History of blood transfusion  2013  . Hypertension      Social History   Socioeconomic History  . Marital status: Married    Spouse name: Not on file  . Number of children: 2  . Years of education: Not on file  . Highest education level: Not on file  Social Needs  . Financial resource strain: Not on file  . Food insecurity - worry: Not on file  . Food insecurity - inability: Not on file  . Transportation needs - medical: Not on file  . Transportation needs - non-medical: Not on file  Occupational History  . Not on file  Tobacco Use  . Smoking status: Never Smoker  . Smokeless tobacco: Never Used  Substance and Sexual Activity  . Alcohol use: Yes    Comment: 1 glass of wine per week prior to preg  . Drug use: No  . Sexual activity: Yes  Other Topics Concern  . Not on file  Social History Narrative  . Not on file    Past Surgical History:  Procedure Laterality Date  . CESAREAN SECTION     x 2  . CESAREAN SECTION  09/29/2012   Procedure: CESAREAN SECTION;  Surgeon: Luz Lex, MD;  Location: [facility];  Service: Obstetrics;  Laterality: N/A;  Repeat edc 10/12/12/REQUEST;Chassity,Dee,Colleen  . COLONOSCOPY    . ESOPHAGOGASTRODUODENOSCOPY  normal    7/12  . LAPAROSCOPIC VAGINAL HYSTERECTOMY WITH SALPINGECTOMY Bilateral 02/23/2016   Procedure: LAPAROSCOPIC ASSISTED VAGINAL HYSTERECTOMY WITH bilateral SALPINGECTOMY, right  oophorectomy. laporotic repair of incidental cystotomy.;  Surgeon: Louretta Shorten, MD;  Location: [facility];  Service: Gynecology;  Laterality: Bilateral;  . MOUTH SURGERY      Family History  Problem Relation Age of Onset  . Depression Mother   . Hypertension Mother   . Diabetes Mother   . Obesity Mother   . Thyroid disease Mother   . Cancer Father        ? lung CA  . Kidney disease Father   . Hydrocephalus Sister   . Colon cancer Neg Hx     Allergies  Allergen Reactions  . Azithromycin Nausea And Vomiting    diarrhea    Current Outpatient Medications on File Prior to Visit    Medication Sig Dispense Refill  . ALPRAZolam (XANAX) 0.5 MG tablet Take 0.5 mg by mouth 2 (two) times daily as needed for anxiety.     Marland Kitchen aspirin 81 MG tablet Take 81 mg by mouth daily.    Marland Kitchen atorvastatin (LIPITOR) 10 MG tablet TAKE 1 TABLET BY MOUTH  DAILY 90 tablet 0  . b complex vitamins capsule Take 1 capsule by mouth daily.    Marland Kitchen buPROPion (WELLBUTRIN XL) 300 MG 24 hr tablet Take 1 tablet by mouth daily.  12  . Cholecalciferol (VITAMIN D) 2000 units CAPS Take 2,000 Units by mouth daily.     . clindamycin (CLEOCIN) 150 MG capsule Take 1 capsule (150 mg total) by mouth 3 (three) times daily. 21 capsule 0  . cyclobenzaprine (FLEXERIL) 5 MG tablet Take 1 tablet (5 mg total) by mouth at bedtime. 20 tablet 1  . diclofenac (VOLTAREN) 75 MG EC tablet Take 1 tablet (75 mg total) by mouth 2 (two) times daily. 20 tablet 0  . fenofibrate (TRICOR) 48 MG tablet Take 1 tablet (48 mg total) daily by mouth. 90 tablet 3  . FLUoxetine (PROZAC) 40 MG capsule Take 40 mg by mouth daily.     . hydrochlorothiazide (MICROZIDE) 12.5 MG capsule TAKE 1 CAPSULE BY MOUTH  DAILY 90 capsule 0  . potassium chloride (K-DUR) 10 MEQ tablet 1 tab po every other day 45 tablet 0  . zolpidem (AMBIEN) 10 MG tablet Take 10 mg by mouth at bedtime as needed for sleep.   5  . [DISCONTINUED] pantoprazole (PROTONIX) 40 MG tablet Take 40 mg by mouth daily before breakfast.       No current facility-administered medications on file prior to visit.     BP (!) 151/91 (BP Location: Right Arm, Patient Position: Supine, Cuff Size: Normal)   Pulse 99   Temp 97.6 F (36.4 C) (Oral)   Resp 16   Ht 5\' 2"  (1.575 m)   Wt 184 lb 12.8 oz (83.8 kg)   LMP 02/11/2016 (Exact Date)   SpO2 97%   BMI 33.80 kg/m       Objective:   Physical Exam  General- No acute distress. Pleasant patient. Neck- Full range of motion, no jvd Lungs- Clear, even and unlabored. Heart- regular rate and rhythm. Neurologic- CNII- XII grossly  intact.  Abdomen Inspection:-Inspection Normal.  Palpation/Perucssion: Palpation and Percussion of the abdomen reveal- left lower quadrant  Tenderness, No Rebound tenderness, No rigidity(Guarding) and No Palpable abdominal masses.  Liver:-Normal.  Spleen:- Normal.   Back- no cva tenderness      Assessment & Plan:  Your persisting left lower quadrant/adnexal region pain may be related to ovarian cyst and a recent rupture.  However we do have the CT recently that mentioned  abdominal wall abscess appearance.  You have also been on recent antibiotics and still have some symptoms.  Your urine was clear today.  No blood seen.  I want to get CBC to assess your WBC count and get metabolic panel as well.  Please return in Ifob test tomorrow.  If that test were to show blood then would likely refer you to gastroenterologist.  If that test is negative will discuss your recent presentation with MD in our office.  Might even reach out again to radiologist to see if they recommend further imaging such as repeat CT or may be MRI.  Presently prescribing Norco to help with the intermittent high level pain you described.  You can continue either ibuprofen or Aleve as well.  Follow-up in 7 days or as needed.  Mackie Pai, PA-C

## 2018-02-22 NOTE — Telephone Encounter (Signed)
Pt has appointment today.  

## 2018-02-23 LAB — CBC WITH DIFFERENTIAL/PLATELET
BASOS ABS: 0.1 10*3/uL (ref 0.0–0.1)
Basophils Relative: 0.7 % (ref 0.0–3.0)
EOS ABS: 0.1 10*3/uL (ref 0.0–0.7)
Eosinophils Relative: 1.9 % (ref 0.0–5.0)
HCT: 41.3 % (ref 36.0–46.0)
Hemoglobin: 13.9 g/dL (ref 12.0–15.0)
Lymphocytes Relative: 34.5 % (ref 12.0–46.0)
Lymphs Abs: 2.7 10*3/uL (ref 0.7–4.0)
MCHC: 33.5 g/dL (ref 30.0–36.0)
MCV: 94.8 fl (ref 78.0–100.0)
MONO ABS: 0.5 10*3/uL (ref 0.1–1.0)
Monocytes Relative: 6.7 % (ref 3.0–12.0)
NEUTROS ABS: 4.4 10*3/uL (ref 1.4–7.7)
NEUTROS PCT: 56.2 % (ref 43.0–77.0)
PLATELETS: 304 10*3/uL (ref 150.0–400.0)
RBC: 4.36 Mil/uL (ref 3.87–5.11)
RDW: 13.3 % (ref 11.5–15.5)
WBC: 7.9 10*3/uL (ref 4.0–10.5)

## 2018-02-23 LAB — COMPREHENSIVE METABOLIC PANEL
ALK PHOS: 51 U/L (ref 39–117)
ALT: 18 U/L (ref 0–35)
AST: 18 U/L (ref 0–37)
Albumin: 4.1 g/dL (ref 3.5–5.2)
BILIRUBIN TOTAL: 0.3 mg/dL (ref 0.2–1.2)
BUN: 7 mg/dL (ref 6–23)
CO2: 29 meq/L (ref 19–32)
Calcium: 9.9 mg/dL (ref 8.4–10.5)
Chloride: 100 mEq/L (ref 96–112)
Creatinine, Ser: 0.81 mg/dL (ref 0.40–1.20)
GFR: 97.73 mL/min (ref >60.00)
GLUCOSE: 79 mg/dL (ref 70–99)
Potassium: 3.6 mEq/L (ref 3.5–5.1)
SODIUM: 138 meq/L (ref 135–145)
TOTAL PROTEIN: 7.5 g/dL (ref 6.0–8.3)

## 2018-02-24 ENCOUNTER — Telehealth: Payer: Self-pay | Admitting: *Deleted

## 2018-02-24 ENCOUNTER — Encounter: Payer: Self-pay | Admitting: Medical

## 2018-02-24 NOTE — Telephone Encounter (Signed)
I talk with patient today after hours and she was describing constant increasing severe left lower quadrant pain.  Work up so far it did not reveal a cause.  So I did advise to go to the emergency department as workup and further imaging may be needed.  In light of her increasing severe pain I did not just want her to take pain medications without the workup.

## 2018-02-24 NOTE — Telephone Encounter (Signed)
Copied from [place] [id number]. Topic: Inquiry >> Feb 24, 2018  3:26 PM Ahmed Prima L wrote: Patient wants to know what she needs to do as far as her pain. She would not disclose her pain, she said [name] will know. She said does she need to go to ER or take a bunch of pills?  Please advise [phone]

## 2018-02-25 ENCOUNTER — Other Ambulatory Visit: Payer: Self-pay

## 2018-02-25 ENCOUNTER — Encounter (HOSPITAL_BASED_OUTPATIENT_CLINIC_OR_DEPARTMENT_OTHER): Payer: Self-pay | Admitting: Emergency Medicine

## 2018-02-25 ENCOUNTER — Emergency Department (HOSPITAL_BASED_OUTPATIENT_CLINIC_OR_DEPARTMENT_OTHER)
Admission: EM | Admit: 2018-02-25 | Discharge: 2018-02-25 | Disposition: A | Discharge status: Home / Self Care | Payer: BLUE CROSS/BLUE SHIELD | Attending: Emergency Medicine | Admitting: Emergency Medicine

## 2018-02-25 DIAGNOSIS — Z7982 Long term (current) use of aspirin: Secondary | ICD-10-CM | POA: Insufficient documentation

## 2018-02-25 DIAGNOSIS — E119 Type 2 diabetes mellitus without complications: Secondary | ICD-10-CM | POA: Diagnosis not present

## 2018-02-25 DIAGNOSIS — Z79899 Other long term (current) drug therapy: Secondary | ICD-10-CM | POA: Diagnosis not present

## 2018-02-25 DIAGNOSIS — N83202 Unspecified ovarian cyst, left side: Secondary | ICD-10-CM | POA: Insufficient documentation

## 2018-02-25 DIAGNOSIS — R102 Pelvic and perineal pain: Secondary | ICD-10-CM | POA: Insufficient documentation

## 2018-02-25 DIAGNOSIS — R1032 Left lower quadrant pain: Secondary | ICD-10-CM | POA: Diagnosis not present

## 2018-02-25 DIAGNOSIS — I10 Essential (primary) hypertension: Secondary | ICD-10-CM | POA: Diagnosis not present

## 2018-02-25 LAB — CBC WITH DIFFERENTIAL/PLATELET
BASOS ABS: 0.1 10*3/uL (ref 0.0–0.1)
BASOS PCT: 1 %
EOS PCT: 3 %
Eosinophils Absolute: 0.3 10*3/uL (ref 0.0–0.7)
HCT: 37.3 % (ref 36.0–46.0)
Hemoglobin: 12.4 g/dL (ref 12.0–15.0)
Lymphocytes Relative: 34 %
Lymphs Abs: 3.6 10*3/uL (ref 0.7–4.0)
MCH: 31.6 pg (ref 26.0–34.0)
MCHC: 33.2 g/dL (ref 30.0–36.0)
MCV: 95.2 fL (ref 78.0–100.0)
MONO ABS: 0.7 10*3/uL (ref 0.1–1.0)
Monocytes Relative: 7 %
NEUTROS ABS: 5.9 10*3/uL (ref 1.7–7.7)
Neutrophils Relative %: 55 %
PLATELETS: 281 10*3/uL (ref 150–400)
RBC: 3.92 MIL/uL (ref 3.87–5.11)
RDW: 12.3 % (ref 11.5–15.5)
WBC: 10.5 10*3/uL (ref 4.0–10.5)

## 2018-02-25 LAB — URINALYSIS, ROUTINE W REFLEX MICROSCOPIC
Bilirubin Urine: NEGATIVE
Glucose, UA: NEGATIVE mg/dL
HGB URINE DIPSTICK: NEGATIVE
KETONES UR: 15 mg/dL — AB
LEUKOCYTES UA: NEGATIVE
Nitrite: NEGATIVE
PROTEIN: NEGATIVE mg/dL
Specific Gravity, Urine: >1.03 — ABNORMAL HIGH (ref 1.005–1.030)
pH: 6 (ref 5.0–8.0)

## 2018-02-25 LAB — COMPREHENSIVE METABOLIC PANEL
ALBUMIN: 3.6 g/dL (ref 3.5–5.0)
ALT: 19 U/L (ref 14–54)
AST: 21 U/L (ref 15–41)
Alkaline Phosphatase: 49 U/L (ref 38–126)
Anion gap: 7 (ref 5–15)
BUN: 9 mg/dL (ref 6–20)
CO2: 26 mmol/L (ref 22–32)
CREATININE: 0.79 mg/dL (ref 0.44–1.00)
Calcium: 8.9 mg/dL (ref 8.9–10.3)
Chloride: 106 mmol/L (ref 101–111)
GFR calc Af Amer: >60 mL/min (ref >60)
GLUCOSE: 130 mg/dL — AB (ref 65–99)
POTASSIUM: 3.5 mmol/L (ref 3.5–5.1)
SODIUM: 139 mmol/L (ref 135–145)
Total Bilirubin: 0.3 mg/dL (ref 0.3–1.2)
Total Protein: 6.7 g/dL (ref 6.5–8.1)

## 2018-02-25 LAB — WET PREP, GENITAL
Clue Cells Wet Prep HPF POC: NONE SEEN
Sperm: NONE SEEN
Trich, Wet Prep: NONE SEEN
YEAST WET PREP: NONE SEEN

## 2018-02-25 MED ORDER — ONDANSETRON 4 MG PO TBDP: 4.0000 mg | ORAL_TABLET | Freq: Four times a day (QID) | ORAL | 0 refills | Status: DC | PRN

## 2018-02-25 MED ORDER — OXYCODONE-ACETAMINOPHEN 5-325 MG PO TABS: 1.0000 | ORAL_TABLET | ORAL | 0 refills | Status: DC | PRN

## 2018-02-25 NOTE — ED Provider Notes (Signed)
TIME SEEN: 3:21 AM  CHIEF COMPLAINT: Abdominal pain  HPI: Patient is a 47 year old female with history of migraines, hypertension who presents to the emergency department with complaints of left lower abdominal pain.  Patient reports that she has had this sharp pain for the past 4 months.  States it started off intermittently mostly with having bowel movements and then with persistent.  She was seen by her primary care provider with Pola Corn, PA.  CT of her abdomen pelvis was obtained as an 12/19 which showed possible left sided abdominal wall cellulitis without abscess.  Also question a possible left ruptured ovarian cyst as there was free fluid in the left pelvis.  Was given clindamycin which patient never filled.  She denies ever having any redness, warmth or skin changes to this area.  States 4 days later she saw her OB/GYN.  States she saw the nurse practitioner who performed a pelvic exam and thought that she could have PID.  Was given IM Rocephin and 10 days of doxycycline which she completed.  States then she later followed up with her OB/GYN Dr. Corinna Capra who thought this was less likely PID given patient is status post hysterectomy.  She is also status post right oophorectomy.  Patient reports no improvement of symptoms after antibiotics.  She had a vaginal ultrasound on 02/13/18 which showed 2 left benign ovarian cyst.  States she followed up with her primary care physician and was placed on Vicodin for pain control.  States that this will control her pain for a couple of hours and then it comes back.  States pain is sharp in nature and radiates throughout her lower abdomen but originates in the pelvic region.  She denies any fevers, vomiting, diarrhea, bloody stool, melena, hematuria, vaginal bleeding or discharge.  Has had some intermittent nausea.  Reports her last colonoscopy was approximately 2 years ago with Dr. Ardis Hughs with [facility] and was normal.  She has never had symptoms like this  before.  She states she was told by her primary care provider to come to the ED for further evaluation.  ROS: See HPI Constitutional: no fever  Eyes: no drainage  ENT: no runny nose   Cardiovascular:  no chest pain  Resp: no SOB  GI: no vomiting GU: no dysuria Integumentary: no rash  Allergy: no hives  Musculoskeletal: no leg swelling  Neurological: no slurred speech ROS otherwise negative  PAST MEDICAL HISTORY/PAST SURGICAL HISTORY:  Past Medical History:  Diagnosis Date  . Acne   . Anemia    history of  . Anxiety   . Depression   . Diabetes mellitus    gest this pregnancy  . GERD (gastroesophageal reflux disease)    worse while pregnant  . Headache    Migraines  . Heart murmur    ? heart murmur in past  . History of blood transfusion 2013  . Hypertension     MEDICATIONS:  Prior to Admission medications   Medication Sig Start Date End Date Taking? Authorizing Provider  ALPRAZolam Duanne Moron) 0.5 MG tablet Take 0.5 mg by mouth 2 (two) times daily as needed for anxiety.     [provider]  aspirin 81 MG tablet Take 81 mg by mouth daily.    [provider]  atorvastatin (LIPITOR) 10 MG tablet TAKE 1 TABLET BY MOUTH  DAILY 01/24/18   Saguier, [name], PA-C  b complex vitamins capsule Take 1 capsule by mouth daily.    [provider]  buPROPion (  WELLBUTRIN XL) 300 MG 24 hr tablet Take 1 tablet by mouth daily. 03/20/17   [provider]  Cholecalciferol (VITAMIN D) 2000 units CAPS Take 2,000 Units by mouth daily.     [provider]  clindamycin (CLEOCIN) 150 MG capsule Take 1 capsule (150 mg total) by mouth 3 (three) times daily. 02/07/18   Saguier, [name], PA-C  cyclobenzaprine (FLEXERIL) 5 MG tablet Take 1 tablet (5 mg total) by mouth at bedtime. 09/18/16   Saguier, [name], PA-C  diclofenac (VOLTAREN) 75 MG EC tablet Take 1 tablet (75 mg total) by mouth 2 (two) times daily. 02/07/18   Saguier, [name], PA-C  fenofibrate (TRICOR) 48 MG  tablet Take 1 tablet (48 mg total) daily by mouth. 11/11/17   Saguier, [name], PA-C  FLUoxetine (PROZAC) 40 MG capsule Take 40 mg by mouth daily.     [provider]  hydrochlorothiazide (MICROZIDE) 12.5 MG capsule TAKE 1 CAPSULE BY MOUTH  DAILY 12/23/17   Saguier, [name], PA-C  HYDROcodone-acetaminophen (NORCO) 5-325 MG tablet Take 1 tablet by mouth every 6 (six) hours as needed for moderate pain. 02/22/18   Saguier, [name], PA-C  potassium chloride (K-DUR) 10 MEQ tablet 1 tab po every other day 04/27/17   Saguier, [name], PA-C  zolpidem (AMBIEN) 10 MG tablet Take 10 mg by mouth at bedtime as needed for sleep.  03/24/15   [provider]  pantoprazole (PROTONIX) 40 MG tablet Take 40 mg by mouth daily before breakfast.   04/20/11 03/03/12  Tower, Wynelle Fanny, MD    ALLERGIES:  Allergies  Allergen Reactions  . Azithromycin Nausea And Vomiting    diarrhea    SOCIAL HISTORY:  Social History   Tobacco Use  . Smoking status: Never Smoker  . Smokeless tobacco: Never Used  Substance Use Topics  . Alcohol use: Yes    Comment: 1 glass of wine per week prior to preg    FAMILY HISTORY: Family History  Problem Relation Age of Onset  . Cancer Father        ? lung CA  . Kidney disease Father   . Depression Mother   . Hypertension Mother   . Diabetes Mother   . Obesity Mother   . Thyroid disease Mother   . Hydrocephalus Sister   . Colon cancer Neg Hx     EXAM: BP (!) 152/101 (BP Location: Right Arm)   Pulse 89   Temp 97.9 F (36.6 C) (Oral)   Resp 16   Ht 5\' 2"  (1.575 m)   Wt 83.5 kg (184 lb)   LMP 02/11/2016 (Exact Date)   SpO2 100%   BMI 33.65 kg/m  CONSTITUTIONAL: Alert and oriented and responds appropriately to questions. Well-appearing; well-nourished HEAD: Normocephalic EYES: Conjunctivae clear, pupils appear equal, EOMI ENT: normal nose; moist mucous membranes NECK: Supple, no meningismus, no nuchal rigidity, no LAD  CARD: RRR; S1 and S2 appreciated; no  murmurs, no clicks, no rubs, no gallops RESP: Normal chest excursion without splinting or tachypnea; breath sounds clear and equal bilaterally; no wheezes, no rhonchi, no rales, no hypoxia or respiratory distress, speaking full sentences ABD/GI: Normal bowel sounds; non-distended; soft, tender in the left pelvic region, no tenderness at McBurney's point, negative Murphy sign, no rebound, no guarding, no peritoneal signs, no hepatosplenomegaly GU:  Normal external genitalia. No lesions, rashes noted. Patient has no vaginal bleeding on exam. No vaginal discharge.  Cervix is surgically absent.  Patient is status post right oophorectomy.  She does have left adnexal  tenderness on exam but it is not severe but she states this is where her pain originates.  There is no mass or fullness appreciated in this area.  Chaperone present for exam. BACK:  The back appears normal and is non-tender to palpation, there is no CVA tenderness EXT: Normal ROM in all joints; non-tender to palpation; no edema; normal capillary refill; no cyanosis, no calf tenderness or swelling    SKIN: Normal color for age and race; warm; no rash NEURO: Moves all extremities equally PSYCH: The patient's mood and manner are appropriate. Grooming and personal hygiene are appropriate.  MEDICAL DECISION MAKING: Patient here with persistent left pelvic pain.  I suspect that this still could be ovarian cyst.  She has had a CT scan which did not show diverticuli and no sign of diverticulitis.  No colitis and she has not had diarrhea, bloody stools or melena.  She does have some pain with bowel movements and therefore have recommended she follow-up with her gastroenterologist but reports a normal colonoscopy several years ago.  No tenderness at McBurney's point and a negative Murphy sign.  Labs here are reassuring including no leukocytosis, normal LFTs, creatinine.  Urine shows no sign of infection.  I doubt that this is a kidney stone or pyelonephritis.   I doubt that this is a bowel obstruction.  Nothing on her exam to suggest abdominal wall cellulitis and there is no abscess seen her pelvic exam reveals left adnexal tenderness but no mass and no significant pain to suggest torsion.  Unfortunately at this time I am unable to obtain a transvaginal ultrasound here in the emergency department at [facility].  I have offered to transfer her to [facility] for this imaging.  She states that she does not feel this needs to be done emergently and I agree especially given I do not think this is a torsion.  I feel this can be done as an outpatient.  I do not feel that she needs another CT of her abdomen pelvis emergently.  We have discussed risk of radiation exposure versus benefit.  I do not think that the CT is going to give [place] much information at this time especially with normal labs, normal vitals.  I have recommended close follow-up with her OB/GYN as well as her PCP.  I will give her prescription of Percocet she states that she does not feel like the hydrocodone is helping her much.  I have recommended that she continue to eat a high-fiber diet, drink plenty of water and add probiotics to her diet as well.  I do not feel she needs antibiotics currently.  We have discussed at length return precautions.  Patient is comfortable with this plan.   At this time, I do not feel there is any life-threatening condition present. I have reviewed and discussed all results (EKG, imaging, lab, urine as appropriate) and exam findings with patient/family. I have reviewed nursing notes and appropriate previous records.  I feel the patient is safe to be discharged home without further emergent workup and can continue workup as an outpatient as needed. Discussed usual and customary return precautions. Patient/family verbalize understanding and are comfortable with this plan.  Outpatient follow-up has been provided if needed. All questions have been answered.       Ward, Delice Bison, DO 02/25/18 [phone]

## 2018-02-25 NOTE — ED Triage Notes (Signed)
Pt reports LLQ abd pain x 3 weeks pt has been seen by her GYN and PMD with dx of ruptured ovarian cyst. Pt states pain is not improving.

## 2018-02-25 NOTE — Discharge Instructions (Signed)
Your labs, urine, pelvic swabs were reassuring today.  I recommend close follow-up with your primary care physician and your OB/GYN.  They may recommend a repeat transvaginal ultrasound to further evaluate the 2 left ovarian cyst seen on previous ultrasound.  Today I do not feel you need an emergent repeat CT scan of your abdomen and pelvis.  Your pain seems to be localized to the left ovary.  I also recommend close follow-up with your gastroenterologist given you are having some pain with bowel movements.   I recommend that she use MiraLAX and Colace over-the-counter to help keep your stool soft while taking narcotic pain medication.  Please do not take hydrocodone and oxycodone together.  I also recommend that you add a probiotic to your regimen daily.

## 2018-02-27 ENCOUNTER — Telehealth: Payer: Self-pay | Admitting: Medical

## 2018-02-27 LAB — GC/CHLAMYDIA PROBE AMP (~~LOC~~) NOT AT ARMC
Chlamydia: NEGATIVE
Neisseria Gonorrhea: NEGATIVE

## 2018-02-27 MED ORDER — LORCASERIN HCL 10 MG PO TABS: ORAL_TABLET | ORAL | 0 refills | Status: DC

## 2018-02-27 NOTE — Telephone Encounter (Signed)
rx belviq sent in.

## 2018-03-06 ENCOUNTER — Other Ambulatory Visit: Payer: Self-pay | Admitting: Medical

## 2018-03-07 ENCOUNTER — Encounter: Payer: Self-pay | Admitting: Medical

## 2018-03-13 DIAGNOSIS — N83202 Unspecified ovarian cyst, left side: Secondary | ICD-10-CM | POA: Diagnosis not present

## 2018-03-13 DIAGNOSIS — Z01419 Encounter for gynecological examination (general) (routine) without abnormal findings: Secondary | ICD-10-CM | POA: Diagnosis not present

## 2018-03-13 DIAGNOSIS — R1032 Left lower quadrant pain: Secondary | ICD-10-CM | POA: Diagnosis not present

## 2018-03-13 DIAGNOSIS — Z6834 Body mass index (BMI) 34.0-34.9, adult: Secondary | ICD-10-CM | POA: Diagnosis not present

## 2018-03-13 DIAGNOSIS — Z1231 Encounter for screening mammogram for malignant neoplasm of breast: Secondary | ICD-10-CM | POA: Diagnosis not present

## 2018-03-14 ENCOUNTER — Ambulatory Visit: Payer: Self-pay | Admitting: Physician Assistant

## 2018-04-07 ENCOUNTER — Other Ambulatory Visit: Payer: Self-pay | Admitting: Medical

## 2018-04-10 DIAGNOSIS — M9903 Segmental and somatic dysfunction of lumbar region: Secondary | ICD-10-CM | POA: Diagnosis not present

## 2018-04-10 DIAGNOSIS — M62830 Muscle spasm of back: Secondary | ICD-10-CM | POA: Diagnosis not present

## 2018-04-10 DIAGNOSIS — M9901 Segmental and somatic dysfunction of cervical region: Secondary | ICD-10-CM | POA: Diagnosis not present

## 2018-04-10 DIAGNOSIS — M9902 Segmental and somatic dysfunction of thoracic region: Secondary | ICD-10-CM | POA: Diagnosis not present

## 2018-04-13 DIAGNOSIS — M9901 Segmental and somatic dysfunction of cervical region: Secondary | ICD-10-CM | POA: Diagnosis not present

## 2018-04-13 DIAGNOSIS — M62830 Muscle spasm of back: Secondary | ICD-10-CM | POA: Diagnosis not present

## 2018-04-13 DIAGNOSIS — M9902 Segmental and somatic dysfunction of thoracic region: Secondary | ICD-10-CM | POA: Diagnosis not present

## 2018-04-13 DIAGNOSIS — M9903 Segmental and somatic dysfunction of lumbar region: Secondary | ICD-10-CM | POA: Diagnosis not present

## 2018-04-14 ENCOUNTER — Ambulatory Visit: Payer: Self-pay | Admitting: *Deleted

## 2018-04-14 ENCOUNTER — Emergency Department: Payer: BLUE CROSS/BLUE SHIELD

## 2018-04-14 ENCOUNTER — Emergency Department (INDEPENDENT_AMBULATORY_CARE_PROVIDER_SITE_OTHER): Payer: BLUE CROSS/BLUE SHIELD

## 2018-04-14 ENCOUNTER — Encounter: Payer: Self-pay | Admitting: Emergency Medicine

## 2018-04-14 ENCOUNTER — Emergency Department
Admission: EM | Admit: 2018-04-14 | Discharge: 2018-04-14 | Disposition: A | Discharge status: Home / Self Care | Payer: BLUE CROSS/BLUE SHIELD | Source: Home / Self Care | Attending: Family Medicine | Admitting: Family Medicine

## 2018-04-14 ENCOUNTER — Other Ambulatory Visit: Payer: Self-pay

## 2018-04-14 DIAGNOSIS — M542 Cervicalgia: Secondary | ICD-10-CM

## 2018-04-14 DIAGNOSIS — M5412 Radiculopathy, cervical region: Secondary | ICD-10-CM

## 2018-04-14 MED ORDER — PREGABALIN 50 MG PO CAPS: ORAL_CAPSULE | ORAL | 1 refills | Status: DC

## 2018-04-14 NOTE — Telephone Encounter (Signed)
Pt called because she is having nerve pain and intermittent numbness going down her right arm for 3 weeks; it has been worsening for 1 week; the pt states that she has been taking anti-inflammatories with no relief; it has been keeping her awake for the past couple of nights; recommendations made per protocol to include going to ED; she verbalizes understanding; the pt would also like to schedule a follow up appointment with Mackie Pai, [place], she is offered and accepted appointment on 04/17/18 at 1600; the pt verbalizes understanding; will route to office for notification of this upcoming appointment,  Reason for Disposition . [1] SEVERE pain AND [2] not improved 2 hours after pain medicine . Numbness (i.e., loss of sensation) in hand or fingers  Answer Assessment - Initial Assessment Questions 1. ONSET: "When did the pain start?"     3 weeks ago 2. LOCATION: "Where is the pain located?"     arm 3. PAIN: "How bad is the pain?" (Scale 1-10; or mild, moderate, severe)   - MILD (1-3): doesn't interfere with normal activities   - MODERATE (4-7): interferes with normal activities (e.g., work or school) or awakens from sleep   - SEVERE (8-10): excruciating pain, unable to do any normal activities, unable to hold a cup of water     Rated 7-8 out of 10 4. WORK OR EXERCISE: "Has there been any recent work or exercise that involved this part of the body?"     no 5. CAUSE: "What do you think is causing the arm pain?"     Nerve pain 6. OTHER SYMPTOMS: "Do you have any other symptoms?" (e.g., neck pain, swelling, rash, fever, numbness, weakness)     Numbness and weakness 7. PREGNANCY: "Is there any chance you are pregnant?" "When was your last menstrual period?"     No hysterectomy  Protocols used: ARM PAIN-A-AH

## 2018-04-14 NOTE — ED Provider Notes (Addendum)
[facility]    CSN: [id number] Arrival date & time: 04/14/18  1235     History   Chief Complaint Chief Complaint  Patient presents with  . Arm Pain    HPI Ann Frederick is a 47 y.o. female.   Patient complains of right arm pain and numbness for 5 days.  The pain is worse at night.  She has had no improvement with ibuprofen, Aleve, and ice or heat packs.  Flexing her neck to the right increases her symptoms.  She recalls no injury.  The history is provided by the patient.    Past Medical History:  Diagnosis Date  . Acne   . Anemia    history of  . Anxiety   . Depression   . Diabetes mellitus    gest this pregnancy  . GERD (gastroesophageal reflux disease)    worse while pregnant  . Headache    Migraines  . Heart murmur    ? heart murmur in past  . History of blood transfusion 2013  . Hypertension     Patient Active Problem List   Diagnosis Date Noted  . Wellness examination 03/08/2016  . S/P laparoscopic assisted vaginal hysterectomy (LAVH) 02/23/2016  . Pink eye 06/19/2015  . Pain of right thumb 06/19/2015  . Sinusitis, acute frontal 02/05/2015  . Headache around the eyes 02/05/2015  . Back pain 11/01/2014  . Substernal chest pain 12/19/2013  . URI (upper respiratory infection) 12/19/2013  . Routine general medical examination at a health care facility 10/30/2013  . Vesicular rash 09/07/2013  . Sinusitis 02/02/2013  . Viral pharyngitis 03/03/2012  . Flank pain 08/13/2011  . Inguinal adenopathy 08/13/2011  . Dysphagia 04/20/2011  . GERD 01/29/2011  . FATIGUE 01/29/2011  . 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 LEG PAIN, BILATERAL 06/06/2010  . HIP PAIN, LEFT 04/01/2010  . MASS, LOCALIZED, SUPERFICIAL 03/05/2009  . ACNE VULGARIS 07/11/2007  . MURMUR 07/11/2007    Past Surgical History:  Procedure Laterality Date  . CESAREAN SECTION     x 2  . CESAREAN SECTION  09/29/2012   Procedure: CESAREAN SECTION;  Surgeon: Luz Lex, MD;   Location: [facility];  Service: Obstetrics;  Laterality: N/A;  Repeat edc 10/12/12/REQUEST;Chassity,Dee,Colleen  . COLONOSCOPY    . ESOPHAGOGASTRODUODENOSCOPY  normal    7/12  . LAPAROSCOPIC VAGINAL HYSTERECTOMY WITH SALPINGECTOMY Bilateral 02/23/2016   Procedure: LAPAROSCOPIC ASSISTED VAGINAL HYSTERECTOMY WITH bilateral SALPINGECTOMY, right oophorectomy. laporotic repair of incidental cystotomy.;  Surgeon: Louretta Shorten, MD;  Location: [facility];  Service: Gynecology;  Laterality: Bilateral;  . MOUTH SURGERY      OB History    Gravida  8   Para  3   Term  3   Preterm  0   AB  5   Living  3     SAB  4   TAB  1   Ectopic  0   Multiple  0   Live Births  1            Home Medications    Prior to Admission medications   Medication Sig Start Date End Date Taking? Authorizing Provider  Liraglutide -Weight Management (SAXENDA [place]) Inject 6 mg into the skin every morning.   Yes [provider]  ALPRAZolam Duanne Moron) 0.5 MG tablet Take 0.5 mg by mouth 2 (two) times daily as needed for anxiety.     [provider]  aspirin 81 MG tablet Take 81 mg by mouth daily.  [provider]  atorvastatin (LIPITOR) 10 MG tablet TAKE 1 TABLET BY MOUTH  DAILY 04/10/18   Saguier, [name], PA-C  b complex vitamins capsule Take 1 capsule by mouth daily.    [provider]  buPROPion (WELLBUTRIN XL) 300 MG 24 hr tablet Take 1 tablet by mouth daily. 03/20/17   [provider]  Cholecalciferol (VITAMIN D) 2000 units CAPS Take 2,000 Units by mouth daily.     [provider]  clindamycin (CLEOCIN) 150 MG capsule Take 1 capsule (150 mg total) by mouth 3 (three) times daily. 02/07/18   Saguier, [name], PA-C  cyclobenzaprine (FLEXERIL) 5 MG tablet Take 1 tablet (5 mg total) by mouth at bedtime. 09/18/16   Saguier, [name], PA-C  diclofenac (VOLTAREN) 75 MG EC tablet Take 1 tablet (75 mg total) by mouth 2 (two) times daily. 02/07/18   Saguier, [name], PA-C    fenofibrate (TRICOR) 48 MG tablet Take 1 tablet (48 mg total) daily by mouth. 11/11/17   Saguier, [name], PA-C  FLUoxetine (PROZAC) 40 MG capsule Take 40 mg by mouth daily.     [provider]  hydrochlorothiazide (MICROZIDE) 12.5 MG capsule TAKE 1 CAPSULE BY MOUTH  DAILY 03/08/18   Saguier, [name], PA-C  HYDROcodone-acetaminophen (NORCO) 5-325 MG tablet Take 1 tablet by mouth every 6 (six) hours as needed for moderate pain. 02/22/18   Saguier, [name], PA-C  Lorcaserin HCl 10 MG TABS 1 tablet p.o. twice daily 02/27/18   Saguier, [name], PA-C  ondansetron (ZOFRAN ODT) 4 MG disintegrating tablet Take 1 tablet (4 mg total) by mouth every 6 (six) hours as needed for nausea or vomiting. 02/25/18   Ward, Delice Bison, DO  oxyCODONE-acetaminophen (PERCOCET/ROXICET) 5-325 MG tablet Take 1 tablet by mouth every 4 (four) hours as needed for severe pain. 02/25/18   Ward, Delice Bison, DO  potassium chloride (K-DUR) 10 MEQ tablet 1 tab po every other day 04/27/17   Saguier, [name], PA-C  pregabalin (LYRICA) 50 MG capsule Take one cap PO TID 04/14/18   Kandra Nicolas, MD  zolpidem (AMBIEN) 10 MG tablet Take 10 mg by mouth at bedtime as needed for sleep.  03/24/15   [provider]  pantoprazole (PROTONIX) 40 MG tablet Take 40 mg by mouth daily before breakfast.   04/20/11 03/03/12  Tower, Wynelle Fanny, MD    Family History Family History  Problem Relation Age of Onset  . Cancer Father        ? lung CA  . Kidney disease Father   . Depression Mother   . Hypertension Mother   . Diabetes Mother   . Obesity Mother   . Thyroid disease Mother   . Hydrocephalus Sister   . Colon cancer Neg Hx     Social History Social History   Tobacco Use  . Smoking status: Never Smoker  . Smokeless tobacco: Never Used  Substance Use Topics  . Alcohol use: Yes    Comment: 1 glass of wine per week prior to preg  . Drug use: No     Allergies   Azithromycin   Review of Systems Review of Systems  Constitutional:  Negative.   HENT: Negative.   Eyes: Negative.   Respiratory: Negative.   Cardiovascular: Negative.   Gastrointestinal: Negative.   Genitourinary: Negative.   Musculoskeletal: Negative for arthralgias and myalgias.  Skin: Negative.   Neurological: Positive for numbness. Negative for weakness and headaches.     Physical Exam Triage Vital Signs ED Triage Vitals  Enc  Vitals Group     BP 04/14/18 1300 119/80     Pulse Rate 04/14/18 1300 (!) 102     Resp 04/14/18 1300 16     Temp 04/14/18 1300 97.6 F (36.4 C)     Temp Source 04/14/18 1300 Oral     SpO2 04/14/18 1300 97 %     Weight 04/14/18 1301 181 lb (82.1 kg)     Height 04/14/18 1301 5\' 2"  (1.575 m)     Head Circumference --      Peak Flow --      Pain Score 04/14/18 1301 7     Pain Loc --      Pain Edu? --      Excl. in [place]? --    No data found.  Updated Vital Signs BP 119/80 (BP Location: Left Arm)   Pulse (!) 102   Temp 97.6 F (36.4 C) (Oral)   Resp 16   Ht 5\' 2"  (1.575 m)   Wt 181 lb (82.1 kg)   LMP 02/11/2016 (Exact Date)   SpO2 97%   BMI 33.11 kg/m   Visual Acuity Right Eye Distance:   Left Eye Distance:   Bilateral Distance:    Right Eye Near:   Left Eye Near:    Bilateral Near:     Physical Exam  Constitutional: She appears well-developed and well-nourished. No distress.  HENT:  Head: Normocephalic.  Right Ear: External ear normal.  Left Ear: External ear normal.  Nose: Nose normal.  Mouth/Throat: Oropharynx is clear and moist.  Eyes: Pupils are equal, round, and reactive to light. Conjunctivae are normal.  Neck: Muscular tenderness present. No spinous process tenderness present. Neck rigidity present. Decreased range of motion present. No thyromegaly present.    Tenderness right shoulder area as noted on diagram.   Cardiovascular: Regular rhythm and normal heart sounds.  Rate 102  Pulmonary/Chest: Breath sounds normal.  Abdominal: There is no tenderness.  Musculoskeletal: She exhibits  no edema or tenderness.  Lymphadenopathy:    She has no cervical adenopathy.  Neurological: She displays normal reflexes. No cranial nerve deficit or sensory deficit. She exhibits normal muscle tone. Coordination normal.  Skin: Skin is warm and dry. No rash noted.     Nursing note and vitals reviewed.    UC Treatments / Results  Labs (all labs ordered are listed, but only abnormal results are displayed) Labs Reviewed - No data to display  EKG None Radiology Dg Cervical Spine Complete  Result Date: 04/14/2018 CLINICAL DATA:  Neck pain for several weeks, no known injury, initial encounter EXAM: CERVICAL SPINE - COMPLETE 4+ VIEW COMPARISON:  None. FINDINGS: Seven cervical segments are well visualized. There is loss of the normal cervical lordosis which may be related to muscular spasm. Osteophytic changes are noted from C3-C7. Mild neural foraminal narrowing is noted bilaterally. The odontoid is within normal limits. No acute soft tissue abnormality is noted. IMPRESSION: Mild degenerative change without acute abnormality. Electronically Signed   By: Inez Catalina M.D.   On: 04/14/2018 14:11    Procedures Procedures (including critical care time)  Medications Ordered in UC Medications - No data to display   Initial Impression / Assessment and Plan / UC Course  I have reviewed the triage vital signs and the nursing notes.  Pertinent labs & imaging results that were available during my care of the patient were reviewed by me and considered in my medical decision making (see chart for details).    Patient declines  prednisone or corticosteroid injection. Trial of Lyrica 50mg  TID. Apply ice pack for 20 to 30 minutes, 3 to 4 times daily  Continue until pain decreases.  May take Ibuprofen 200mg , 4 tabs every 8 hours with food.   Followup with sports medicine physician as soon as possible for MRI and further evaluation.    Final Clinical Impressions(s) / UC Diagnoses   Final  diagnoses:  Cervical radiculopathy at C7    ED Discharge Orders        Ordered    pregabalin (LYRICA) 50 MG capsule     04/14/18 1440         Kandra Nicolas, MD 04/19/18 2216    Kandra Nicolas, MD 04/19/18 2243

## 2018-04-14 NOTE — Discharge Instructions (Addendum)
Apply ice pack for 20 to 30 minutes, 3 to 4 times daily  Continue until pain decreases.  May take Ibuprofen 200mg, 4 tabs every 8 hours with food.  

## 2018-04-14 NOTE — ED Triage Notes (Signed)
Patient reports right arm numbness intermittently for past 5 days; alternates with pain which is shooting. Denies shortness of breath and chest pain; can grasp and pick things up; feels it is nerve based. Took Aleve at 1100 today.

## 2018-04-17 ENCOUNTER — Ambulatory Visit (INDEPENDENT_AMBULATORY_CARE_PROVIDER_SITE_OTHER): Payer: BLUE CROSS/BLUE SHIELD | Admitting: Medical

## 2018-04-17 ENCOUNTER — Telehealth: Payer: Self-pay | Admitting: Medical

## 2018-04-17 ENCOUNTER — Other Ambulatory Visit: Payer: Self-pay

## 2018-04-17 ENCOUNTER — Encounter (HOSPITAL_BASED_OUTPATIENT_CLINIC_OR_DEPARTMENT_OTHER): Payer: Self-pay | Admitting: *Deleted

## 2018-04-17 ENCOUNTER — Emergency Department (HOSPITAL_BASED_OUTPATIENT_CLINIC_OR_DEPARTMENT_OTHER)
Admission: EM | Admit: 2018-04-17 | Discharge: 2018-04-17 | Disposition: A | Discharge status: Home / Self Care | Payer: BLUE CROSS/BLUE SHIELD | Attending: Emergency Medicine | Admitting: Emergency Medicine

## 2018-04-17 ENCOUNTER — Encounter: Payer: Self-pay | Admitting: Medical

## 2018-04-17 VITALS — BP 155/85 | HR 87 | Temp 97.8°F | Resp 16 | Ht 62.0 in | Wt 184.4 lb

## 2018-04-17 DIAGNOSIS — M542 Cervicalgia: Secondary | ICD-10-CM

## 2018-04-17 DIAGNOSIS — E119 Type 2 diabetes mellitus without complications: Secondary | ICD-10-CM | POA: Insufficient documentation

## 2018-04-17 DIAGNOSIS — Z79899 Other long term (current) drug therapy: Secondary | ICD-10-CM | POA: Diagnosis not present

## 2018-04-17 DIAGNOSIS — I10 Essential (primary) hypertension: Secondary | ICD-10-CM | POA: Insufficient documentation

## 2018-04-17 DIAGNOSIS — M5410 Radiculopathy, site unspecified: Secondary | ICD-10-CM | POA: Diagnosis not present

## 2018-04-17 DIAGNOSIS — Z5181 Encounter for therapeutic drug level monitoring: Secondary | ICD-10-CM | POA: Diagnosis not present

## 2018-04-17 DIAGNOSIS — Z7982 Long term (current) use of aspirin: Secondary | ICD-10-CM | POA: Insufficient documentation

## 2018-04-17 DIAGNOSIS — R110 Nausea: Secondary | ICD-10-CM

## 2018-04-17 MED ORDER — CYCLOBENZAPRINE HCL 10 MG PO TABS: 10.0000 mg | ORAL_TABLET | Freq: Every day | ORAL | 0 refills | Status: DC

## 2018-04-17 MED ORDER — METHOCARBAMOL 500 MG PO TABS: 500.0000 mg | ORAL_TABLET | Freq: Two times a day (BID) | ORAL | 0 refills | Status: DC

## 2018-04-17 MED ORDER — METHOCARBAMOL 500 MG PO TABS
ORAL_TABLET | ORAL | Status: AC
  Filled 2018-04-17: qty 2

## 2018-04-17 MED ORDER — HYDROCODONE-ACETAMINOPHEN 5-325 MG PO TABS: 1.0000 | ORAL_TABLET | Freq: Four times a day (QID) | ORAL | 0 refills | Status: DC | PRN

## 2018-04-17 MED ORDER — HYDROCODONE-ACETAMINOPHEN 5-325 MG PO TABS
1.0000 | ORAL_TABLET | Freq: Once | ORAL | Status: DC
  Filled 2018-04-17: qty 1

## 2018-04-17 MED ORDER — METHOCARBAMOL 500 MG PO TABS
1000.0000 mg | ORAL_TABLET | Freq: Once | ORAL | Status: AC
  Administered 2018-04-17: 1000 mg via ORAL

## 2018-04-17 MED ORDER — KETOROLAC TROMETHAMINE 30 MG/ML IJ SOLN
30.0000 mg | Freq: Once | INTRAMUSCULAR | Status: AC
Start: 2018-04-17 — End: 2018-04-17
  Administered 2018-04-17: 30 mg via INTRAMUSCULAR
  Filled 2018-04-17: qty 1

## 2018-04-17 MED FILL — METHOCARBAMOL 500 MG TABLET: 500 | 10 days supply | Qty: 20 | Fill #0

## 2018-04-17 NOTE — ED Triage Notes (Signed)
Pt c/o right neck pain x 1 week, seen by PMD x 3 days ago for same. Increased pain with movt

## 2018-04-17 NOTE — ED Notes (Signed)
ED Provider at bedside. 

## 2018-04-17 NOTE — ED Notes (Signed)
MD Provider at bedside. 

## 2018-04-17 NOTE — Telephone Encounter (Signed)
Copied from [place] [id number]. Topic: Quick Communication - See Telephone Encounter >> Apr 17, 2018 11:21 AM Rosalin Hawking wrote: CRM for notification. See Telephone encounter for: 04/17/18.   PT came in office stating has an appt with Mackie Pai at 4:00 today, pt would like to know if she can have lab work done today before having her appt this afternoon, and would like to know if she can have a MRI order in also, since pt took the day off and would like to have this done before seeing provider. Please advise ASAP.

## 2018-04-17 NOTE — Progress Notes (Signed)
Subjective:    Patient ID: Ann Frederick, female    DOB: 1971/03/11, 47 y.o.   MRN: [id number]  HPI   Pt in with some severe neck pain with pain that radiates down her arm. Some upper thoracic area pain as well.  She has severe pain that is constant. She can't find relief.   At times her rt arm feels weak.Some numbness to finger tips.  Pt present for 3 weeks. Pain since last Monday severe and constant.  Pt was given toradol injection. Pt has some muscle spasms. They gave robaxin muscle relaxant.  Pt does have some lyrica by MD at [facility]. Pt took 2 dose of lyrica.  Pain is better since using lyrica.   The xray done recently showed some degeneration and foraminal narrowing.    Review of Systems  Constitutional: Negative for chills, fatigue and fever.  Respiratory: Negative for cough, choking, chest tightness and wheezing.   Cardiovascular: Negative for chest pain and palpitations.  Musculoskeletal: Positive for neck pain.  Skin: Negative for rash.  Neurological: Negative for dizziness, speech difficulty, weakness, numbness and headaches.       Radicular pain.  Hematological: Negative for adenopathy. Does not bruise/bleed easily.  Psychiatric/Behavioral: Negative for behavioral problems, confusion and dysphoric mood.    Past Medical History:  Diagnosis Date  . Acne   . Anemia    history of  . Anxiety   . Depression   . Diabetes mellitus    gest this pregnancy  . GERD (gastroesophageal reflux disease)    worse while pregnant  . Headache    Migraines  . Heart murmur    ? heart murmur in past  . History of blood transfusion 2013  . Hypertension      Social History   Socioeconomic History  . Marital status: Married    Spouse name: Not on file  . Number of children: 2  . Years of education: Not on file  . Highest education level: Not on file  Occupational History  . Not on file  Social Needs  . Financial resource strain: Not on file  . Food insecurity:   Worry: Not on file    Inability: Not on file  . Transportation needs:    Medical: Not on file    Non-medical: Not on file  Tobacco Use  . Smoking status: Never Smoker  . Smokeless tobacco: Never Used  Substance and Sexual Activity  . Alcohol use: Yes    Comment: 1 glass of wine per week prior to preg  . Drug use: No  . Sexual activity: Yes  Lifestyle  . Physical activity:    Days per week: Not on file    Minutes per session: Not on file  . Stress: Not on file  Relationships  . Social connections:    Talks on phone: Not on file    Gets together: Not on file    Attends religious service: Not on file    Active member of club or organization: Not on file    Attends meetings of clubs or organizations: Not on file    Relationship status: Not on file  . Intimate partner violence:    Fear of current or ex partner: Not on file    Emotionally abused: Not on file    Physically abused: Not on file    Forced sexual activity: Not on file  Other Topics Concern  . Not on file  Social History Narrative  . Not on file  Past Surgical History:  Procedure Laterality Date  . CESAREAN SECTION     x 2  . CESAREAN SECTION  09/29/2012   Procedure: CESAREAN SECTION;  Surgeon: Luz Lex, MD;  Location: [facility];  Service: Obstetrics;  Laterality: N/A;  Repeat edc 10/12/12/REQUEST;Chassity,Dee,Colleen  . COLONOSCOPY    . ESOPHAGOGASTRODUODENOSCOPY  normal    7/12  . LAPAROSCOPIC VAGINAL HYSTERECTOMY WITH SALPINGECTOMY Bilateral 02/23/2016   Procedure: LAPAROSCOPIC ASSISTED VAGINAL HYSTERECTOMY WITH bilateral SALPINGECTOMY, right oophorectomy. laporotic repair of incidental cystotomy.;  Surgeon: Louretta Shorten, MD;  Location: [facility];  Service: Gynecology;  Laterality: Bilateral;  . MOUTH SURGERY      Family History  Problem Relation Age of Onset  . Cancer Father        ? lung CA  . Kidney disease Father   . Depression Mother   . Hypertension Mother   . Diabetes Mother   . Obesity Mother     . Thyroid disease Mother   . Hydrocephalus Sister   . Colon cancer Neg Hx     Allergies  Allergen Reactions  . Azithromycin Nausea And Vomiting    diarrhea    Current Outpatient Medications on File Prior to Visit  Medication Sig Dispense Refill  . ALPRAZolam (XANAX) 0.5 MG tablet Take 0.5 mg by mouth 2 (two) times daily as needed for anxiety.     Marland Kitchen aspirin 81 MG tablet Take 81 mg by mouth daily.    Marland Kitchen atorvastatin (LIPITOR) 10 MG tablet TAKE 1 TABLET BY MOUTH  DAILY 90 tablet 0  . b complex vitamins capsule Take 1 capsule by mouth daily.    Marland Kitchen buPROPion (WELLBUTRIN XL) 300 MG 24 hr tablet Take 1 tablet by mouth daily.  12  . Cholecalciferol (VITAMIN D) 2000 units CAPS Take 2,000 Units by mouth daily.     . clindamycin (CLEOCIN) 150 MG capsule Take 1 capsule (150 mg total) by mouth 3 (three) times daily. 21 capsule 0  . cyclobenzaprine (FLEXERIL) 5 MG tablet Take 1 tablet (5 mg total) by mouth at bedtime. 20 tablet 1  . diclofenac (VOLTAREN) 75 MG EC tablet Take 1 tablet (75 mg total) by mouth 2 (two) times daily. 20 tablet 0  . fenofibrate (TRICOR) 48 MG tablet Take 1 tablet (48 mg total) daily by mouth. 90 tablet 3  . FLUoxetine (PROZAC) 40 MG capsule Take 40 mg by mouth daily.     . hydrochlorothiazide (MICROZIDE) 12.5 MG capsule TAKE 1 CAPSULE BY MOUTH  DAILY 90 capsule 0  . HYDROcodone-acetaminophen (NORCO) 5-325 MG tablet Take 1 tablet by mouth every 6 (six) hours as needed for moderate pain. 12 tablet 0  . Liraglutide -Weight Management (SAXENDA [place]) Inject 6 mg into the skin every morning.    . Lorcaserin HCl 10 MG TABS 1 tablet p.o. twice daily 60 tablet 0  . ondansetron (ZOFRAN ODT) 4 MG disintegrating tablet Take 1 tablet (4 mg total) by mouth every 6 (six) hours as needed for nausea or vomiting. 20 tablet 0  . oxyCODONE-acetaminophen (PERCOCET/ROXICET) 5-325 MG tablet Take 1 tablet by mouth every 4 (four) hours as needed for severe pain. 15 tablet 0  . potassium chloride  (K-DUR) 10 MEQ tablet 1 tab po every other day 45 tablet 0  . pregabalin (LYRICA) 50 MG capsule Take one cap PO TID 21 capsule 1  . zolpidem (AMBIEN) 10 MG tablet Take 10 mg by mouth at bedtime as needed for sleep.   5  . [DISCONTINUED]  pantoprazole (PROTONIX) 40 MG tablet Take 40 mg by mouth daily before breakfast.       No current facility-administered medications on file prior to visit.     BP (!) 155/85 (BP Location: Left Arm, Patient Position: Sitting, Cuff Size: Small)   Pulse 87   Temp 97.8 F (36.6 C) (Oral)   Resp 16   Ht 5\' 2"  (1.575 m)   Wt 184 lb 6.4 oz (83.6 kg)   LMP 02/11/2016 (Exact Date)   SpO2 100%   BMI 33.73 kg/m       Objective:   Physical Exam  General Mental Status- Alert. General Appearance- Not in acute distress.   Skin General: Color- Normal Color. Moisture- Normal Moisture.  Neck Carotid Arteries- Normal color. Moisture- Normal Moisture. No carotid bruits. No JVD. Mid cervical spine pain.  Chest and Lung Exam Auscultation: Breath Sounds:-Normal.  Cardiovascular Auscultation:Rythm- Regular. Murmurs & Other Heart Sounds:Auscultation of the heart reveals- No Murmurs.  Abdomen Inspection:-Inspeection Normal. Palpation/Percussion:Note:No mass. Palpation and Percussion of the abdomen reveal- Non Tender, Non Distended + BS, no rebound or guarding.  Neurologic Cranial Nerve exam:- CN III-XII intact(No nystagmus), symmetric smile. Strength:- rt upper ext grip 4/5. Left upper ext grip 5/5. Sharp and dull discrimination intact on rt hand.  Thoracic- mild upper T-spine tender.    Assessment & Plan:  For neck pain with radicular pain, I want you to use the lyrica. I will also will add flexeril muscle relaxant. Please stop robaxin. Will rx norco for severe pain.  Will place order for mri and may refer you to sports medicine MD.  For rare intermittent nauseau and saxenda use will order cmp, amylase and lipase.  Follow up in 7-10 days or as  needed  [employer]

## 2018-04-17 NOTE — Discharge Instructions (Signed)
Please keep your appointment today with your primary care doctor.  Robaxin is your muscle relaxer to take as needed.   Return to ER for new or worsening symptoms, any additional concerns.

## 2018-04-17 NOTE — Patient Instructions (Signed)
For neck pain with radicular pain, I want you to use the lyrica. I will also will add flexeril muscle relaxant. Please stop robaxin. Will rx norco for severe pain.  Will place order for mri and may refer you to sports medicine MD.  For rare intermittent nauseau and saxenda use will order cmp, amylase and lipase.  Follow up in 7-10 days or as needed

## 2018-04-17 NOTE — Telephone Encounter (Signed)
FYI to PCP

## 2018-04-17 NOTE — ED Provider Notes (Signed)
[facility] Provider Note   CSN: [id number] Arrival date & time: 04/17/18  7829     History   Chief Complaint Chief Complaint  Patient presents with  . Neck Pain    HPI Ann Frederick is a 47 y.o. female.  The history is provided by the patient and medical records. No language interpreter was used.  Neck Pain   Associated symptoms include numbness. Pertinent negatives include no headaches and no weakness.   Ann Frederick is a 47 y.o. female  with a PMH as listed below who presents to the Emergency Department complaining of one-week history of right-sided neck pain which radiates down the arm.  Pain is aching.  She also reports an additional shooting type sensation which moves from neck down to her wrist.  Where per chart review, she declined steroid injection, but did get prescribed Lyrica.  She reports taking Lyrica as directed along with Motrin with no improvement in her symptoms.  She denies any fever, chills, chest pain, shortness of breath.  Does not feel as if the arm is weak.  She has dropped any items trouble with her grip.  States that her mother and sister both had similar constellation of symptoms and required neck surgery.  She has never been evaluated by orthopedist or spine specialist.   Past Medical History:  Diagnosis Date  . Acne   . Anemia    history of  . Anxiety   . Depression   . Diabetes mellitus    gest this pregnancy  . GERD (gastroesophageal reflux disease)    worse while pregnant  . Headache    Migraines  . Heart murmur    ? heart murmur in past  . History of blood transfusion 2013  . Hypertension     Patient Active Problem List   Diagnosis Date Noted  . Wellness examination 03/08/2016  . S/P laparoscopic assisted vaginal hysterectomy (LAVH) 02/23/2016  . Pink eye 06/19/2015  . Pain of right thumb 06/19/2015  . Sinusitis, acute frontal 02/05/2015  . Headache around the eyes 02/05/2015  . Back pain 11/01/2014    . Substernal chest pain 12/19/2013  . URI (upper respiratory infection) 12/19/2013  . Routine general medical examination at a health care facility 10/30/2013  . Vesicular rash 09/07/2013  . Sinusitis 02/02/2013  . Viral pharyngitis 03/03/2012  . Flank pain 08/13/2011  . Inguinal adenopathy 08/13/2011  . Dysphagia 04/20/2011  . GERD 01/29/2011  . FATIGUE 01/29/2011  . 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 LEG PAIN, BILATERAL 06/06/2010  . HIP PAIN, LEFT 04/01/2010  . MASS, LOCALIZED, SUPERFICIAL 03/05/2009  . ACNE VULGARIS 07/11/2007  . MURMUR 07/11/2007    Past Surgical History:  Procedure Laterality Date  . CESAREAN SECTION     x 2  . CESAREAN SECTION  09/29/2012   Procedure: CESAREAN SECTION;  Surgeon: Luz Lex, MD;  Location: [facility];  Service: Obstetrics;  Laterality: N/A;  Repeat edc 10/12/12/REQUEST;Chassity,Dee,Colleen  . COLONOSCOPY    . ESOPHAGOGASTRODUODENOSCOPY  normal    7/12  . LAPAROSCOPIC VAGINAL HYSTERECTOMY WITH SALPINGECTOMY Bilateral 02/23/2016   Procedure: LAPAROSCOPIC ASSISTED VAGINAL HYSTERECTOMY WITH bilateral SALPINGECTOMY, right oophorectomy. laporotic repair of incidental cystotomy.;  Surgeon: Louretta Shorten, MD;  Location: [facility];  Service: Gynecology;  Laterality: Bilateral;  . MOUTH SURGERY       OB History    Gravida  8   Para  3   Term  3   Preterm  0  AB  5   Living  3     SAB  4   TAB  1   Ectopic  0   Multiple  0   Live Births  1            Home Medications    Prior to Admission medications   Medication Sig Start Date End Date Taking? Authorizing Provider  ALPRAZolam Duanne Moron) 0.5 MG tablet Take 0.5 mg by mouth 2 (two) times daily as needed for anxiety.     [provider]  aspirin 81 MG tablet Take 81 mg by mouth daily.    [provider]  atorvastatin (LIPITOR) 10 MG tablet TAKE 1 TABLET BY MOUTH  DAILY 04/10/18   Saguier, [name], PA-C  b complex vitamins capsule Take 1 capsule by mouth daily.     [provider]  buPROPion (WELLBUTRIN XL) 300 MG 24 hr tablet Take 1 tablet by mouth daily. 03/20/17   [provider]  Cholecalciferol (VITAMIN D) 2000 units CAPS Take 2,000 Units by mouth daily.     [provider]  clindamycin (CLEOCIN) 150 MG capsule Take 1 capsule (150 mg total) by mouth 3 (three) times daily. 02/07/18   Saguier, [name], PA-C  cyclobenzaprine (FLEXERIL) 5 MG tablet Take 1 tablet (5 mg total) by mouth at bedtime. 09/18/16   Saguier, [name], PA-C  diclofenac (VOLTAREN) 75 MG EC tablet Take 1 tablet (75 mg total) by mouth 2 (two) times daily. 02/07/18   Saguier, [name], PA-C  fenofibrate (TRICOR) 48 MG tablet Take 1 tablet (48 mg total) daily by mouth. 11/11/17   Saguier, [name], PA-C  FLUoxetine (PROZAC) 40 MG capsule Take 40 mg by mouth daily.     [provider]  hydrochlorothiazide (MICROZIDE) 12.5 MG capsule TAKE 1 CAPSULE BY MOUTH  DAILY 03/08/18   Saguier, [name], PA-C  HYDROcodone-acetaminophen (NORCO) 5-325 MG tablet Take 1 tablet by mouth every 6 (six) hours as needed for moderate pain. 02/22/18   Saguier, [name], PA-C  Liraglutide -Weight Management (SAXENDA ) Inject 6 mg into the skin every morning.    [provider]  Lorcaserin HCl 10 MG TABS 1 tablet p.o. twice daily 02/27/18   Saguier, [name], PA-C  methocarbamol (ROBAXIN) 500 MG tablet Take 1 tablet (500 mg total) by mouth 2 (two) times daily. 04/17/18   Jenai Scaletta, Ozella Almond, PA-C  ondansetron (ZOFRAN ODT) 4 MG disintegrating tablet Take 1 tablet (4 mg total) by mouth every 6 (six) hours as needed for nausea or vomiting. 02/25/18   Lyly Canizales, Delice Bison, DO  oxyCODONE-acetaminophen (PERCOCET/ROXICET) 5-325 MG tablet Take 1 tablet by mouth every 4 (four) hours as needed for severe pain. 02/25/18   Jaece Ducharme, Delice Bison, DO  potassium chloride (K-DUR) 10 MEQ tablet 1 tab po every other day 04/27/17   Saguier, [name], PA-C  pregabalin (LYRICA) 50 MG capsule Take one cap PO TID 04/14/18   Kandra Nicolas, MD  zolpidem (AMBIEN) 10 MG tablet Take 10 mg by mouth at bedtime as needed for sleep.  03/24/15   [provider]  pantoprazole (PROTONIX) 40 MG tablet Take 40 mg by mouth daily before breakfast.   04/20/11 03/03/12  Tower, Wynelle Fanny, MD    Family History Family History  Problem Relation Age of Onset  . Cancer Father        ? lung CA  . Kidney disease Father   . Depression Mother   . Hypertension Mother   . Diabetes Mother   .  Obesity Mother   . Thyroid disease Mother   . Hydrocephalus Sister   . Colon cancer Neg Hx     Social History Social History   Tobacco Use  . Smoking status: Never Smoker  . Smokeless tobacco: Never Used  Substance Use Topics  . Alcohol use: Yes    Comment: 1 glass of wine per week prior to preg  . Drug use: No     Allergies   Azithromycin   Review of Systems Review of Systems  Constitutional: Negative for chills and fever.  Musculoskeletal: Positive for neck pain.  Neurological: Positive for numbness. Negative for dizziness, weakness and headaches.     Physical Exam Updated Vital Signs BP (!) 145/91   Pulse 99   Temp 97.6 F (36.4 C) (Oral)   Resp 16   Ht 5\' 2"  (1.575 m)   Wt 82.1 kg (181 lb)   LMP 02/11/2016 (Exact Date)   SpO2 99%   BMI 33.11 kg/m   Physical Exam  Constitutional: She appears well-developed and well-nourished. No distress.  HENT:  Head: Normocephalic and atraumatic.  Neck: Neck supple.  Tenderness to palpation of midline and right lateral neck musculature; most significantly to paraspinal musculature. Full ROM intact. No bony stepoffs or deformities. No rigidity or meningeal signs. No bruising, erythema, or swelling.  Cardiovascular: Normal rate, regular rhythm and normal heart sounds.  No murmur heard. Pulmonary/Chest: Effort normal and breath sounds normal. No respiratory distress. She has no wheezes. She has no rales.  Musculoskeletal:  Full ROM and 5/5 muscle strength of the right upper  extremity.  Good grip strength.  Negative Neer's. 2+ radial pulse.  Sensation intact to radial, ulnar and median nerve distributions.  Neurological: She is alert.  Skin: Skin is warm and dry.  Nursing note and vitals reviewed.    ED Treatments / Results  Labs (all labs ordered are listed, but only abnormal results are displayed) Labs Reviewed - No data to display  EKG None  Radiology No results found.  Procedures Procedures (including critical care time)  Medications Ordered in ED Medications  HYDROcodone-acetaminophen (NORCO/VICODIN) 5-325 MG per tablet 1 tablet (1 tablet Oral Refused 04/17/18 1100)  methocarbamol (ROBAXIN) 500 MG tablet (has no administration in time range)  ketorolac (TORADOL) 30 MG/ML injection 30 mg (30 mg Intramuscular Given 04/17/18 1100)  methocarbamol (ROBAXIN) tablet 1,000 mg (1,000 mg Oral Given 04/17/18 1110)     Initial Impression / Assessment and Plan / ED Course  I have reviewed the triage vital signs and the nursing notes.  Pertinent labs & imaging results that were available during my care of the patient were reviewed by me and considered in my medical decision making (see chart for details).    Ann Frederick is a 47 y.o. female who presents to ED for one week history of right sided neck pain and right arm numbness.  She has been evaluated on 4/19 care.  Chart reviewed from this encounter.  She was started on Lyrica and encouraged to take Motrin.  She has been doing this with no improvement in her symptoms.  On exam, patient afebrile, hemodynamically stable. No meningeal signs. RUE is NVI. She has an appointment with her primary care doctor TODAY at 4pm. I instructed patient that she will likely need MRI for further evaluation, however this should be ordered by her PCP, especially given that she has an appointment later today.  She was agreeable to this plan.  She asked me what I was  going to do for her pain.  I offered her Toradol shot, steroid shot  and/or 1 dose of PO pain medication here in the emergency department.  Informed her that I would not be giving any prescriptions for home.  Patient asked if I was the PA or Dr.  I informed her I was the PA.  She requested to speak with the physician.  Dr. Lita Mains evaluated and spoke with patient.  Offered muscle relaxants as well ER and can give prescription for muscle relaxer at home.  No IM/IV narcotic or narcotic prescription for home.  Again encouraged to follow-up with PCP at appointment today to further discuss her symptoms.    Final Clinical Impressions(s) / ED Diagnoses   Final diagnoses:  Neck pain    ED Discharge Orders        Ordered    methocarbamol (ROBAXIN) 500 MG tablet  2 times daily     04/17/18 1109       Nasiah Lehenbauer, Ozella Almond, PA-C 04/17/18 1146    Julianne Rice, MD 04/17/18 [phone]

## 2018-04-17 NOTE — ED Notes (Signed)
PT refused viciodin MD made aware

## 2018-04-18 LAB — COMPREHENSIVE METABOLIC PANEL
ALBUMIN: 3.9 g/dL (ref 3.5–5.2)
ALT: 24 U/L (ref 0–35)
AST: 25 U/L (ref 0–37)
Alkaline Phosphatase: 51 U/L (ref 39–117)
BUN: 7 mg/dL (ref 6–23)
CALCIUM: 9.3 mg/dL (ref 8.4–10.5)
CO2: 29 mEq/L (ref 19–32)
Chloride: 103 mEq/L (ref 96–112)
Creatinine, Ser: 0.73 mg/dL (ref 0.40–1.20)
GFR: 110.12 mL/min (ref >60.00)
Glucose, Bld: 85 mg/dL (ref 70–99)
POTASSIUM: 3.9 meq/L (ref 3.5–5.1)
SODIUM: 138 meq/L (ref 135–145)
Total Bilirubin: 0.3 mg/dL (ref 0.2–1.2)
Total Protein: 6.6 g/dL (ref 6.0–8.3)

## 2018-04-18 LAB — AMYLASE: AMYLASE: 43 U/L (ref 27–131)

## 2018-04-18 LAB — LIPASE: LIPASE: 17 U/L (ref 11.0–59.0)

## 2018-04-19 ENCOUNTER — Telehealth: Payer: Self-pay | Admitting: Medical

## 2018-04-19 ENCOUNTER — Emergency Department (HOSPITAL_COMMUNITY)
Admission: EM | Admit: 2018-04-19 | Discharge: 2018-04-19 | Disposition: A | Discharge status: Home / Self Care | Payer: BLUE CROSS/BLUE SHIELD | Attending: Emergency Medicine | Admitting: Emergency Medicine

## 2018-04-19 ENCOUNTER — Other Ambulatory Visit: Payer: Self-pay

## 2018-04-19 ENCOUNTER — Emergency Department (HOSPITAL_COMMUNITY): Payer: BLUE CROSS/BLUE SHIELD

## 2018-04-19 ENCOUNTER — Encounter (HOSPITAL_COMMUNITY): Payer: Self-pay

## 2018-04-19 DIAGNOSIS — M542 Cervicalgia: Secondary | ICD-10-CM

## 2018-04-19 DIAGNOSIS — Z7982 Long term (current) use of aspirin: Secondary | ICD-10-CM | POA: Diagnosis not present

## 2018-04-19 DIAGNOSIS — I10 Essential (primary) hypertension: Secondary | ICD-10-CM | POA: Diagnosis not present

## 2018-04-19 DIAGNOSIS — E119 Type 2 diabetes mellitus without complications: Secondary | ICD-10-CM | POA: Insufficient documentation

## 2018-04-19 DIAGNOSIS — Z79899 Other long term (current) drug therapy: Secondary | ICD-10-CM | POA: Insufficient documentation

## 2018-04-19 DIAGNOSIS — R0789 Other chest pain: Secondary | ICD-10-CM | POA: Diagnosis not present

## 2018-04-19 DIAGNOSIS — R079 Chest pain, unspecified: Secondary | ICD-10-CM | POA: Diagnosis not present

## 2018-04-19 DIAGNOSIS — M5410 Radiculopathy, site unspecified: Secondary | ICD-10-CM

## 2018-04-19 LAB — CBC
HEMATOCRIT: 41 % (ref 36.0–46.0)
Hemoglobin: 14 g/dL (ref 12.0–15.0)
MCH: 31.8 pg (ref 26.0–34.0)
MCHC: 34.1 g/dL (ref 30.0–36.0)
MCV: 93.2 fL (ref 78.0–100.0)
Platelets: 338 10*3/uL (ref 150–400)
RBC: 4.4 MIL/uL (ref 3.87–5.11)
RDW: 13.3 % (ref 11.5–15.5)
WBC: 10.7 10*3/uL — AB (ref 4.0–10.5)

## 2018-04-19 LAB — BASIC METABOLIC PANEL
Anion gap: 9 (ref 5–15)
BUN: <5 mg/dL — ABNORMAL LOW (ref 6–20)
CHLORIDE: 102 mmol/L (ref 101–111)
CO2: 25 mmol/L (ref 22–32)
Calcium: 9 mg/dL (ref 8.9–10.3)
Creatinine, Ser: 0.85 mg/dL (ref 0.44–1.00)
GFR calc Af Amer: >60 mL/min (ref >60)
GFR calc non Af Amer: >60 mL/min (ref >60)
GLUCOSE: 109 mg/dL — AB (ref 65–99)
POTASSIUM: 3.8 mmol/L (ref 3.5–5.1)
Sodium: 136 mmol/L (ref 135–145)

## 2018-04-19 LAB — D-DIMER, QUANTITATIVE (NOT AT ARMC): D DIMER QUANT: 0.36 ug{FEU}/mL (ref 0.00–0.50)

## 2018-04-19 LAB — I-STAT TROPONIN, ED
TROPONIN I, POC: 0 ng/mL (ref 0.00–0.08)
Troponin i, poc: 0.01 ng/mL (ref 0.00–0.08)

## 2018-04-19 MED ORDER — HYDROMORPHONE HCL 2 MG/ML IJ SOLN
1.0000 mg | Freq: Once | INTRAMUSCULAR | Status: AC
  Administered 2018-04-19: 1 mg via INTRAMUSCULAR
  Filled 2018-04-19: qty 1

## 2018-04-19 MED ORDER — PREDNISONE 20 MG PO TABS
40.0000 mg | ORAL_TABLET | Freq: Once | ORAL | Status: AC
  Administered 2018-04-19: 40 mg via ORAL
  Filled 2018-04-19: qty 2

## 2018-04-19 MED ORDER — PREDNISONE 10 MG PO TABS: 40.0000 mg | ORAL_TABLET | Freq: Every day | ORAL | 0 refills | Status: AC

## 2018-04-19 NOTE — ED Notes (Signed)
Pt stable, ambulatory, states understanding of discharge instructions, pt states dilaudid IM made nerve pain a little worse, PA claudia notified

## 2018-04-19 NOTE — ED Notes (Signed)
Results reviewed, no changes in acuity at this time 

## 2018-04-19 NOTE — Telephone Encounter (Signed)
You can take out mri neck order. Will get sports med/Dr.hudnall opionion see if he can get that ordered if necessary.

## 2018-04-19 NOTE — Discharge Instructions (Signed)
Chest pain work up today was reassuring. Given your risk factors it is unlikely you will have a major cardiac event in the next 30 days given reassuring labs, EKG, chest xray and heart enzymes.  The cause of the chest discomfort is unclear, there may be some referred pain from your neck.   Call [facility] and/or primary care doctor to make appointment for MRI.  Return to the emergency department for fevers, chills, loss of sensation or function of the arm. Your declined steroid taper which is used by neurosurgery for nerve inflammation. Continue flexeril, norco, tylenol, ibuprofen as prescribed previously.

## 2018-04-19 NOTE — ED Notes (Signed)
PA claudia notified chest pain not relieved with dilaudid shot and made arm pain worsen

## 2018-04-19 NOTE — ED Triage Notes (Signed)
Pt presents for evaluation of R sided chest pain and radiation down arm. States she was started on lyrica recently for pain by pcp for muscle spasms and stiffness to R side. Pt was seen Monday at [facility] for neck pain and muscle spasms.

## 2018-04-19 NOTE — ED Notes (Signed)
Pt requesting to talk to Dr. Alvino Chapel, states "he is supposed to sign off on my chart and hasn't even seen me, I would like to talk to him".  Dr Alvino Chapel notified and agreed

## 2018-04-19 NOTE — Telephone Encounter (Signed)
Pt went to ED today for neck pain and radicular pain. Will you see if she can get in with Dr. Timothy Lasso tomorrow or Friday.
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
## 2018-04-20 NOTE — Telephone Encounter (Signed)
Ann Frederick with [facility] calling back and states the request for the MRI has been denied. He states the provider has the right to do a peer to peer review or appeal the case. Peer to peer phone number#: [phone] Ref#: [id number]

## 2018-04-20 NOTE — Telephone Encounter (Signed)
Juin G with [facility] states that they need additional clinicals or a peer to peer review in order to approve pt for the MRI. CB#: [phone] Ref#: [id number]

## 2018-04-20 NOTE — Telephone Encounter (Signed)
Ann Frederick, patient called yesterday to follow up. Please advise on care plan to refer her to ortho & they will order MRI. Thanks   Copied from [place]. Topic: Complaint - Care >> Apr 19, 2018  4:14 PM Ivar Drape wrote: Date of Incident:   04/19/28 Details of complaint:   Patient would like to know when they will get a referral for the MRI How would the patient like to see it resolved?   They want more information about the referral and when it is scheduled On a scale of 1-10, how was your experience?  What would it take to bring it to a 10?   Route to Engineer, building services.

## 2018-04-20 NOTE — Telephone Encounter (Signed)
Sent Gwenn a message. Referred to sports med. Want her to be seen today or tomorrow if possible

## 2018-04-25 ENCOUNTER — Telehealth: Payer: Self-pay | Admitting: Medical

## 2018-04-25 ENCOUNTER — Encounter: Payer: Self-pay | Admitting: Family Medicine

## 2018-04-25 ENCOUNTER — Ambulatory Visit (INDEPENDENT_AMBULATORY_CARE_PROVIDER_SITE_OTHER): Payer: BLUE CROSS/BLUE SHIELD | Admitting: Family Medicine

## 2018-04-25 DIAGNOSIS — M542 Cervicalgia: Secondary | ICD-10-CM

## 2018-04-25 MED ORDER — PREDNISONE 10 MG PO TABS: ORAL_TABLET | ORAL | 0 refills | Status: DC

## 2018-04-25 NOTE — Patient Instructions (Signed)
You have cervical radiculopathy (a pinched nerve in the neck). Prednisone 6 day dose pack to relieve irritation/inflammation of the nerve. Don't take your voltaren again until the day AFTER finishing the prednisone Ok to take robaxin during the day as you have been with flexeril OR zanaflex at night. Percocet as needed for severe pain (no driving or working on this medicine). Consider cervical collar if severely painful. Simple range of motion exercises within limits of pain to prevent further stiffness. Consider physical therapy for stretching, exercises, traction, and modalities. Heat 15 minutes at a time 3-4 times a day to help with spasms. Watch head position when on computers, texting, when sleeping in bed - should in line with back to prevent further nerve traction and irritation. We will go ahead with an MRI of your cervical spine as you haven't improved with conservative treatment and you have triceps weakness.

## 2018-04-26 ENCOUNTER — Encounter

## 2018-04-26 ENCOUNTER — Ambulatory Visit: Payer: BLUE CROSS/BLUE SHIELD | Admitting: Family Medicine

## 2018-04-26 ENCOUNTER — Encounter: Payer: Self-pay | Admitting: Family Medicine

## 2018-04-26 DIAGNOSIS — M542 Cervicalgia: Secondary | ICD-10-CM | POA: Insufficient documentation

## 2018-04-26 MED ORDER — METHOCARBAMOL 750 MG PO TABS: 750.0000 mg | ORAL_TABLET | Freq: Three times a day (TID) | ORAL | 0 refills | Status: DC | PRN

## 2018-04-26 NOTE — Telephone Encounter (Signed)
Refilled robaxin and DC'D flexeril.

## 2018-04-26 NOTE — Telephone Encounter (Signed)
Pt requesting flexeril to be changed to Robaxin. See message below.   Patient Comment: Ann Frederick, I dont see it in my med list but I would prefer to request a refill for the robaxin 500mg , this is less sedating than the flexeril.

## 2018-04-26 NOTE — Progress Notes (Signed)
PCP and consultation requested by: Mackie Pai, PA-C  Subjective:   HPI: Patient is a 47 y.o. female here for neck pain.  Patient reports for about 3 weeks she has had right-sided neck pain radiating down to about the dorsal wrist. She states she did not have any injury or trauma but just woke up with the pain and could not move her head to the side. Pain feels like a fire and a soreness through this distribution. She has tried prednisone, Dilaudid, Percocet, Voltaren, and Lyrica. She takes Robaxin during the day with Zanaflex or Flexeril at nighttime. She did see the chiropractor once which helped. Pain level 7 out of 10 and sharp currently. No bowel or bladder dysfunction. No skin changes.  Past Medical History:  Diagnosis Date  . Acne   . Anemia    history of  . Anxiety   . Depression   . Diabetes mellitus    gest this pregnancy  . GERD (gastroesophageal reflux disease)    worse while pregnant  . Headache    Migraines  . Heart murmur    ? heart murmur in past  . History of blood transfusion 2013  . Hypertension     Current Outpatient Medications on File Prior to Visit  Medication Sig Dispense Refill  . aspirin 81 MG tablet Take 81 mg by mouth daily.    Marland Kitchen atorvastatin (LIPITOR) 10 MG tablet TAKE 1 TABLET BY MOUTH  DAILY 90 tablet 0  . b complex vitamins capsule Take 1 capsule by mouth daily.    Marland Kitchen buPROPion (WELLBUTRIN XL) 300 MG 24 hr tablet Take 1 tablet by mouth daily.  12  . clindamycin (CLEOCIN) 150 MG capsule Take 1 capsule (150 mg total) by mouth 3 (three) times daily. 21 capsule 0  . diclofenac (VOLTAREN) 75 MG EC tablet Take 1 tablet (75 mg total) by mouth 2 (two) times daily. 20 tablet 0  . hydrochlorothiazide (MICROZIDE) 12.5 MG capsule TAKE 1 CAPSULE BY MOUTH  DAILY 90 capsule 0  . HYDROcodone-acetaminophen (NORCO) 5-325 MG tablet Take 1 tablet by mouth every 6 (six) hours as needed for moderate pain. 16 tablet 0  . Lorcaserin HCl 10 MG TABS 1 tablet  p.o. twice daily 60 tablet 0  . ondansetron (ZOFRAN ODT) 4 MG disintegrating tablet Take 1 tablet (4 mg total) by mouth every 6 (six) hours as needed for nausea or vomiting. 20 tablet 0  . oxyCODONE-acetaminophen (PERCOCET/ROXICET) 5-325 MG tablet Take 1 tablet by mouth every 4 (four) hours as needed for severe pain. 15 tablet 0  . potassium chloride (K-DUR) 10 MEQ tablet 1 tab po every other day 45 tablet 0  . pregabalin (LYRICA) 50 MG capsule Take one cap PO TID (Patient taking differently: Take 50 mg by mouth 3 (three) times daily. ) 21 capsule 1  . zolpidem (AMBIEN) 10 MG tablet Take 10 mg by mouth at bedtime as needed for sleep.   5  . [DISCONTINUED] pantoprazole (PROTONIX) 40 MG tablet Take 40 mg by mouth daily before breakfast.       No current facility-administered medications on file prior to visit.     Past Surgical History:  Procedure Laterality Date  . CESAREAN SECTION     x 2  . CESAREAN SECTION  09/29/2012   Procedure: CESAREAN SECTION;  Surgeon: Luz Lex, MD;  Location: [facility];  Service: Obstetrics;  Laterality: N/A;  Repeat edc 10/12/12/REQUEST;Chassity,Dee,Colleen  . COLONOSCOPY    . ESOPHAGOGASTRODUODENOSCOPY  normal  7/12  . LAPAROSCOPIC VAGINAL HYSTERECTOMY WITH SALPINGECTOMY Bilateral 02/23/2016   Procedure: LAPAROSCOPIC ASSISTED VAGINAL HYSTERECTOMY WITH bilateral SALPINGECTOMY, right oophorectomy. laporotic repair of incidental cystotomy.;  Surgeon: Louretta Shorten, MD;  Location: [facility];  Service: Gynecology;  Laterality: Bilateral;  . MOUTH SURGERY      Allergies  Allergen Reactions  . Azithromycin Nausea And Vomiting    diarrhea    Social History   Socioeconomic History  . Marital status: Married    Spouse name: Not on file  . Number of children: 2  . Years of education: Not on file  . Highest education level: Not on file  Occupational History  . Not on file  Social Needs  . Financial resource strain: Not on file  . Food insecurity:    Worry: Not  on file    Inability: Not on file  . Transportation needs:    Medical: Not on file    Non-medical: Not on file  Tobacco Use  . Smoking status: Never Smoker  . Smokeless tobacco: Never Used  Substance and Sexual Activity  . Alcohol use: Yes    Comment: 1 glass of wine per week prior to preg  . Drug use: No  . Sexual activity: Yes  Lifestyle  . Physical activity:    Days per week: Not on file    Minutes per session: Not on file  . Stress: Not on file  Relationships  . Social connections:    Talks on phone: Not on file    Gets together: Not on file    Attends religious service: Not on file    Active member of club or organization: Not on file    Attends meetings of clubs or organizations: Not on file    Relationship status: Not on file  . Intimate partner violence:    Fear of current or ex partner: Not on file    Emotionally abused: Not on file    Physically abused: Not on file    Forced sexual activity: Not on file  Other Topics Concern  . Not on file  Social History Narrative  . Not on file    Family History  Problem Relation Age of Onset  . Cancer Father        ? lung CA  . Kidney disease Father   . Depression Mother   . Hypertension Mother   . Diabetes Mother   . Obesity Mother   . Thyroid disease Mother   . Hydrocephalus Sister   . Colon cancer Neg Hx     BP 130/83   Pulse (!) 114   Ht 5\' 2"  (1.575 m)   Wt 182 lb (82.6 kg)   LMP 02/11/2016 (Exact Date)   BMI 33.29 kg/m   Review of Systems: See HPI above.     Objective:  Physical Exam:  Gen: NAD, comfortable in exam room  Neck: No gross deformity, swelling, bruising. TTP right cervical paraspinal region.  No midline/bony TTP. Full flexion, extension, left lateral rotation.  Right lateral rotation 20 degrees. BUE strength 5/5 except 3/5 right elbow extension.   Sensation intact to light touch.   Trace right triceps reflex.  2+ left tricep reflex, equal reflexes in bilateral biceps,  brachioradialis tendons. Positive spurlings on right. 2+ radial pulse.  Right shoulder: No swelling, ecchymoses.  No gross deformity. No TTP. FROM. Strength 5/5 with empty can and resisted internal/external rotation. 2+ radial pulse.   Assessment & Plan:  1. Neck pain - with radiation  into right upper extremity.  Associated triceps weakness and diminished reflex.  Will go ahead with MRI as not improving with conservative treatment.  She only took 3 days of prednisone - will do standard 6 day dose pack.  Percocet as needed for severe pain.  Robaxin for spasms.  Motion exercises, consider physical therapy.

## 2018-04-26 NOTE — Assessment & Plan Note (Signed)
with radiation into right upper extremity.  Associated triceps weakness and diminished reflex.  Will go ahead with MRI as not improving with conservative treatment.  She only took 3 days of prednisone - will do standard 6 day dose pack.  Percocet as needed for severe pain.  Robaxin for spasms.  Motion exercises, consider physical therapy.

## 2018-05-02 NOTE — Addendum Note (Signed)
Addended by: Sherrie George F on: 05/02/2018 07:57 AM   Modules accepted: Orders

## 2018-05-18 ENCOUNTER — Other Ambulatory Visit: Payer: Self-pay | Admitting: Medical

## 2018-05-23 ENCOUNTER — Other Ambulatory Visit: Payer: Self-pay | Admitting: Medical

## 2018-05-23 NOTE — Telephone Encounter (Signed)
I did send  prescription diclofenac and robaxin  to patient pharmacy.

## 2018-05-23 NOTE — Telephone Encounter (Signed)
Pt requesting refill on diclofenac and robaxin for neck pain . Please advise.

## 2018-05-25 ENCOUNTER — Ambulatory Visit (INDEPENDENT_AMBULATORY_CARE_PROVIDER_SITE_OTHER): Payer: BLUE CROSS/BLUE SHIELD | Admitting: Medical

## 2018-05-25 ENCOUNTER — Encounter: Payer: Self-pay | Admitting: Medical

## 2018-05-25 VITALS — BP 127/88 | HR 133 | Temp 97.7°F | Resp 16 | Ht 62.0 in | Wt 181.8 lb

## 2018-05-25 DIAGNOSIS — M542 Cervicalgia: Secondary | ICD-10-CM

## 2018-05-25 DIAGNOSIS — M5410 Radiculopathy, site unspecified: Secondary | ICD-10-CM

## 2018-05-25 DIAGNOSIS — R000 Tachycardia, unspecified: Secondary | ICD-10-CM | POA: Diagnosis not present

## 2018-05-25 MED ORDER — GABAPENTIN 100 MG PO CAPS
100.0000 mg | ORAL_CAPSULE | Freq: Three times a day (TID) | ORAL | 0 refills | Status: DC
Start: 2018-05-25 — End: 2018-07-28

## 2018-05-25 MED FILL — GABAPENTIN 100 MG CAPSULE: 100 | 30 days supply | Qty: 90 | Fill #0

## 2018-05-25 NOTE — Progress Notes (Signed)
Subjective:    Patient ID: Ann Frederick, female    DOB: [birth year], 47 y.o.   MRN: [id number]  HPI   Pt in for some persisting rt upper extremity  pain that radiates down from the  rt side of neck.  Pt states robaxin does help along with voltaren.   Pt had mri approved but then was not scheduled. But by time she got letter the prior authorization had expired. She has appointment with Dr. Barbaraann Barthel on this coming Monday.  Pt is not any narcotic now.  Pt willing to try gabapentin. Lyrica cost her $100 dollars.  Pt states pain level varies in neck varies 5-7/10.   Pt pulse high today. No caffeine today. No decongestants. Pt states occasional heart beats fast in past but did not feel like heart beating fast recently. No recent sensation of fast heart. No chest pain or shortness of breath.      Review of Systems  Constitutional: Negative for chills, fatigue and fever.  Respiratory: Negative for cough, chest tightness, shortness of breath and wheezing.   Cardiovascular: Negative for chest pain and palpitations.  Gastrointestinal: Negative for abdominal pain, blood in stool, constipation, diarrhea and vomiting.  Musculoskeletal: Positive for neck pain. Negative for back pain.  Skin: Negative for rash.  Neurological: Negative for dizziness, weakness and headaches.       Shooting pain stops at her wrist. From her neck.  Hematological: Negative for adenopathy. Does not bruise/bleed easily.  Psychiatric/Behavioral: Negative for behavioral problems and confusion.    Past Medical History:  Diagnosis Date  . Acne   . Anemia    history of  . Anxiety   . Depression   . Diabetes mellitus    gest this pregnancy  . GERD (gastroesophageal reflux disease)    worse while pregnant  . Headache    Migraines  . Heart murmur    ? heart murmur in past  . History of blood transfusion 2013  . Hypertension      Social History   Socioeconomic History  . Marital status: Married    Spouse  name: Not on file  . Number of children: 2  . Years of education: Not on file  . Highest education level: Not on file  Occupational History  . Not on file  Social Needs  . Financial resource strain: Not on file  . Food insecurity:    Worry: Not on file    Inability: Not on file  . Transportation needs:    Medical: Not on file    Non-medical: Not on file  Tobacco Use  . Smoking status: Never Smoker  . Smokeless tobacco: Never Used  Substance and Sexual Activity  . Alcohol use: Yes    Comment: 1 glass of wine per week prior to preg  . Drug use: No  . Sexual activity: Yes  Lifestyle  . Physical activity:    Days per week: Not on file    Minutes per session: Not on file  . Stress: Not on file  Relationships  . Social connections:    Talks on phone: Not on file    Gets together: Not on file    Attends religious service: Not on file    Active member of club or organization: Not on file    Attends meetings of clubs or organizations: Not on file    Relationship status: Not on file  . Intimate partner violence:    Fear of current or ex partner: Not  on file    Emotionally abused: Not on file    Physically abused: Not on file    Forced sexual activity: Not on file  Other Topics Concern  . Not on file  Social History Narrative  . Not on file    Past Surgical History:  Procedure Laterality Date  . CESAREAN SECTION     x 2  . CESAREAN SECTION  09/29/2012   Procedure: CESAREAN SECTION;  Surgeon: Luz Lex, MD;  Location: [facility];  Service: Obstetrics;  Laterality: N/A;  Repeat edc 10/12/12/REQUEST;Chassity,Dee,Colleen  . COLONOSCOPY    . ESOPHAGOGASTRODUODENOSCOPY  normal    7/12  . LAPAROSCOPIC VAGINAL HYSTERECTOMY WITH SALPINGECTOMY Bilateral 02/23/2016   Procedure: LAPAROSCOPIC ASSISTED VAGINAL HYSTERECTOMY WITH bilateral SALPINGECTOMY, right oophorectomy. laporotic repair of incidental cystotomy.;  Surgeon: Louretta Shorten, MD;  Location: [facility];  Service: Gynecology;   Laterality: Bilateral;  . MOUTH SURGERY      Family History  Problem Relation Age of Onset  . Cancer Father        ? lung CA  . Kidney disease Father   . Depression Mother   . Hypertension Mother   . Diabetes Mother   . Obesity Mother   . Thyroid disease Mother   . Hydrocephalus Sister   . Colon cancer Neg Hx     Allergies  Allergen Reactions  . Azithromycin Nausea And Vomiting    diarrhea    Current Outpatient Medications on File Prior to Visit  Medication Sig Dispense Refill  . aspirin 81 MG tablet Take 81 mg by mouth daily.    Marland Kitchen b complex vitamins capsule Take 1 capsule by mouth daily.    Marland Kitchen buPROPion (WELLBUTRIN XL) 300 MG 24 hr tablet Take 1 tablet by mouth daily.  12  . diclofenac (VOLTAREN) 75 MG EC tablet TAKE 1 TABLET BY MOUTH TWICE A DAY 20 tablet 0  . hydrochlorothiazide (MICROZIDE) 12.5 MG capsule TAKE 1 CAPSULE BY MOUTH  DAILY 90 capsule 0  . Lorcaserin HCl 10 MG TABS 1 tablet p.o. twice daily 60 tablet 0  . methocarbamol (ROBAXIN) 750 MG tablet TAKE 1 TABLET (750 MG TOTAL) BY MOUTH EVERY 8 (EIGHT) HOURS AS NEEDED FOR MUSCLE SPASMS. 30 tablet 0  . pregabalin (LYRICA) 50 MG capsule Take one cap PO TID (Patient taking differently: Take 50 mg by mouth 3 (three) times daily. ) 21 capsule 1  . zolpidem (AMBIEN) 10 MG tablet Take 10 mg by mouth at bedtime as needed for sleep.   5  . [DISCONTINUED] pantoprazole (PROTONIX) 40 MG tablet Take 40 mg by mouth daily before breakfast.       No current facility-administered medications on file prior to visit.     BP 127/88   Pulse (!) 138   Temp 97.7 F (36.5 C) (Oral)   Resp 16   Ht 5\' 2"  (1.575 m)   Wt 181 lb 12.8 oz (82.5 kg)   LMP 02/11/2016 (Exact Date)   SpO2 100%   BMI 33.25 kg/m       Objective:   Physical Exam  General Mental Status- Alert. General Appearance- Not in acute distress.   Skin General: Color- Normal Color. Moisture- Normal Moisture.  Neck Carotid Arteries- Normal color. Moisture-  Normal Moisture. No carotid bruits. No JVD. Mid spinal neck pain and rt trapeius pain on palpation.  Chest and Lung Exam Auscultation: Breath Sounds:-Normal.  Cardiovascular Auscultation:Rythm- Regular. Murmurs & Other Heart Sounds:Auscultation of the heart reveals- No Murmurs.  Abdomen  Inspection:-Inspeection Normal. Palpation/Percussion:Note:No mass. Palpation and Percussion of the abdomen reveal- Non Tender, Non Distended + BS, no rebound or guarding.   Neurologic Cranial Nerve exam:- CN III-XII intact(No nystagmus), symmetric smile.  Strength:- 4/5 rt side grip strength. Left side 5/5 grip strength.      Assessment & Plan:  For your neck pain and radicular pain, I want you to continue with Robaxin, diclofenac and I am prescribing gabapentin today.  Please remember instructions on titrating up on nighttime dose of gabapentin.  For your episodes of tachycardia, I think it would be best to go ahead and refer you to cardiologist.  Your EKG after resting eventually did show a rate of 119.  However before we did the EKG your pulse rates were in the 130 range.  This was at rest and you had no caffeine or decongestants today.  Also you do report remote history of feeling tachycardia in the past.  Keep checking your blood pressure and pulse daily.  If you find that your pulse rate is exceeding 150 would recommend ED evaluation.  Follow-up in 3 weeks or as needed.

## 2018-05-25 NOTE — Patient Instructions (Signed)
For your neck pain and radicular pain, I want you to continue with Robaxin, diclofenac and I am prescribing gabapentin today.  Please remember instructions on titrating up on nighttime dose of gabapentin.  For your episodes of tachycardia, I think it would be best to go ahead and refer you to cardiologist.  Your EKG after resting eventually did show a rate of 119.  However before we did the EKG your pulse rates were in the 130 range.  This was at rest and you had no caffeine or decongestants today.  Also you do report remote history of feeling tachycardia in the past.  Keep checking your blood pressure and pulse daily.  If you find that your pulse rate is exceeding 150 would recommend ED evaluation.  Follow-up in 3 weeks or as needed.

## 2018-05-29 ENCOUNTER — Encounter: Payer: Self-pay | Admitting: Family Medicine

## 2018-05-29 ENCOUNTER — Encounter: Payer: Self-pay | Admitting: Medical

## 2018-05-29 ENCOUNTER — Ambulatory Visit (INDEPENDENT_AMBULATORY_CARE_PROVIDER_SITE_OTHER): Payer: BLUE CROSS/BLUE SHIELD | Admitting: Family Medicine

## 2018-05-29 ENCOUNTER — Ambulatory Visit (INDEPENDENT_AMBULATORY_CARE_PROVIDER_SITE_OTHER): Payer: BLUE CROSS/BLUE SHIELD | Admitting: Medical

## 2018-05-29 ENCOUNTER — Ambulatory Visit (INDEPENDENT_AMBULATORY_CARE_PROVIDER_SITE_OTHER): Payer: BLUE CROSS/BLUE SHIELD

## 2018-05-29 VITALS — BP 142/88 | HR 107 | Resp 16 | Ht 62.0 in | Wt 187.8 lb

## 2018-05-29 DIAGNOSIS — M50222 Other cervical disc displacement at C5-C6 level: Secondary | ICD-10-CM | POA: Diagnosis not present

## 2018-05-29 DIAGNOSIS — M4802 Spinal stenosis, cervical region: Secondary | ICD-10-CM | POA: Diagnosis not present

## 2018-05-29 DIAGNOSIS — M50223 Other cervical disc displacement at C6-C7 level: Secondary | ICD-10-CM | POA: Diagnosis not present

## 2018-05-29 DIAGNOSIS — M5410 Radiculopathy, site unspecified: Secondary | ICD-10-CM | POA: Diagnosis not present

## 2018-05-29 DIAGNOSIS — M542 Cervicalgia: Secondary | ICD-10-CM

## 2018-05-29 MED ORDER — OXYCODONE-ACETAMINOPHEN 5-325 MG PREPACK: ORAL_TABLET | ORAL | 0 refills | Status: DC

## 2018-05-29 MED ORDER — TIZANIDINE HCL 6 MG PO CAPS: 6.0000 mg | ORAL_CAPSULE | Freq: Three times a day (TID) | ORAL | 0 refills | Status: DC | PRN

## 2018-05-29 NOTE — Patient Instructions (Signed)
For severe neck pain with radiating features, I am adding percocet since regimen of nsaids, gabapentin and muscle relaxant inadequate to control pan.   Rx zanaflex given today. Stop robaxin.  Will follow your mri of neck that you are getting today. Will see if what [facility]  MD recommends.  Will consider refilling presrciption of percocet if needed(may even place you on short contract temporarily). If you need to see neurosurgeon but if there is a delay.  Follow up date to be determined after reviewing mri report and Dr. Timothy Lasso update on mri result.

## 2018-05-29 NOTE — Progress Notes (Signed)
Subjective:    Patient ID: Ann Frederick, female    DOB: [birth year], 47 y.o.   MRN: [id number]  HPI  Pt in for follow up.  Pt getting MRI today at [facility].  She is still having severe pain in her neck with radiating features. She wants different med regimen.  BP up slightly and mild high pulse.(lilkley related to severe pain)  Pt does not that she got some relief from gabapentin at night.  Muscle relaxant not really helping. She thinks robaxin not helping. She thinks zanaflex would be better.   Pain level is severe and she is getting frustrated.Pain level is 8/10. She states that she feel like about to cry at time but holds back. But loosing temper more easily since in pain and not able to sleep.   Pt thinks she needs level of pain control needed is percocet rather than norco. She had both.  Pt still has neck pain that radiates from her neck down her rt arm.   Review of Systems  Constitutional: Negative for chills, fatigue and fever.  Respiratory: Negative for chest tightness, shortness of breath and wheezing.   Musculoskeletal: Positive for neck pain. Negative for neck stiffness.  Skin: Negative for rash.  Neurological: Negative for dizziness and headaches.  Hematological: Negative for adenopathy. Does not bruise/bleed easily.  Psychiatric/Behavioral: Negative for behavioral problems and confusion.   Past Medical History:  Diagnosis Date  . Acne   . Anemia    history of  . Anxiety   . Depression   . Diabetes mellitus    gest this pregnancy  . GERD (gastroesophageal reflux disease)    worse while pregnant  . Headache    Migraines  . Heart murmur    ? heart murmur in past  . History of blood transfusion 2013  . Hypertension      Social History   Socioeconomic History  . Marital status: Married    Spouse name: Not on file  . Number of children: 2  . Years of education: Not on file  . Highest education level: Not on file  Occupational History  . Not  on file  Social Needs  . Financial resource strain: Not on file  . Food insecurity:    Worry: Not on file    Inability: Not on file  . Transportation needs:    Medical: Not on file    Non-medical: Not on file  Tobacco Use  . Smoking status: Never Smoker  . Smokeless tobacco: Never Used  Substance and Sexual Activity  . Alcohol use: Yes    Comment: 1 glass of wine per week prior to preg  . Drug use: No  . Sexual activity: Yes  Lifestyle  . Physical activity:    Days per week: Not on file    Minutes per session: Not on file  . Stress: Not on file  Relationships  . Social connections:    Talks on phone: Not on file    Gets together: Not on file    Attends religious service: Not on file    Active member of club or organization: Not on file    Attends meetings of clubs or organizations: Not on file    Relationship status: Not on file  . Intimate partner violence:    Fear of current or ex partner: Not on file    Emotionally abused: Not on file    Physically abused: Not on file    Forced sexual activity: Not on  file  Other Topics Concern  . Not on file  Social History Narrative  . Not on file    Past Surgical History:  Procedure Laterality Date  . CESAREAN SECTION     x 2  . CESAREAN SECTION  09/29/2012   Procedure: CESAREAN SECTION;  Surgeon: Luz Lex, MD;  Location: [facility];  Service: Obstetrics;  Laterality: N/A;  Repeat edc 10/12/12/REQUEST;Chassity,Dee,Colleen  . COLONOSCOPY    . ESOPHAGOGASTRODUODENOSCOPY  normal    7/12  . LAPAROSCOPIC VAGINAL HYSTERECTOMY WITH SALPINGECTOMY Bilateral 02/23/2016   Procedure: LAPAROSCOPIC ASSISTED VAGINAL HYSTERECTOMY WITH bilateral SALPINGECTOMY, right oophorectomy. laporotic repair of incidental cystotomy.;  Surgeon: Louretta Shorten, MD;  Location: [facility];  Service: Gynecology;  Laterality: Bilateral;  . MOUTH SURGERY      Family History  Problem Relation Age of Onset  . Cancer Father        ? lung CA  . Kidney disease Father    . Depression Mother   . Hypertension Mother   . Diabetes Mother   . Obesity Mother   . Thyroid disease Mother   . Hydrocephalus Sister   . Colon cancer Neg Hx     Allergies  Allergen Reactions  . Azithromycin Nausea And Vomiting    diarrhea    Current Outpatient Medications on File Prior to Visit  Medication Sig Dispense Refill  . aspirin 81 MG tablet Take 81 mg by mouth daily.    Marland Kitchen b complex vitamins capsule Take 1 capsule by mouth daily.    Marland Kitchen buPROPion (WELLBUTRIN XL) 300 MG 24 hr tablet Take 1 tablet by mouth daily.  12  . diclofenac (VOLTAREN) 75 MG EC tablet TAKE 1 TABLET BY MOUTH TWICE A DAY 20 tablet 0  . gabapentin (NEURONTIN) 100 MG capsule Take 1 capsule (100 mg total) by mouth 3 (three) times daily. 90 capsule 0  . hydrochlorothiazide (MICROZIDE) 12.5 MG capsule TAKE 1 CAPSULE BY MOUTH  DAILY 90 capsule 0  . Lorcaserin HCl 10 MG TABS 1 tablet p.o. twice daily 60 tablet 0  . methocarbamol (ROBAXIN) 750 MG tablet TAKE 1 TABLET (750 MG TOTAL) BY MOUTH EVERY 8 (EIGHT) HOURS AS NEEDED FOR MUSCLE SPASMS. 30 tablet 0  . pregabalin (LYRICA) 50 MG capsule Take one cap PO TID (Patient taking differently: Take 50 mg by mouth 3 (three) times daily. ) 21 capsule 1  . zolpidem (AMBIEN) 10 MG tablet Take 10 mg by mouth at bedtime as needed for sleep.   5  . [DISCONTINUED] pantoprazole (PROTONIX) 40 MG tablet Take 40 mg by mouth daily before breakfast.       No current facility-administered medications on file prior to visit.     BP (!) 142/88   Pulse (!) 107   Resp 16   Ht 5\' 2"  (1.575 m)   Wt 187 lb 12.8 oz (85.2 kg)   LMP 02/11/2016 (Exact Date)   SpO2 98%   BMI 34.35 kg/m       Objective:   Physical Exam   General Mental Status- Alert. General Appearance- Not in acute distress.   Skin General: Color- Normal Color. Moisture- Normal Moisture.  Neck Carotid Arteries- Normal color. Moisture- Normal Moisture. No carotid bruits. No JVD. Mid spinal neck pain and rt  trapeius pain on palpation.(but reports severe pain throughout the day with radiating features to rt upper ext)  Chest and Lung Exam Auscultation: Breath Sounds:-Normal.  Cardiovascular Auscultation:Rythm- Regular. Murmurs & Other Heart Sounds:Auscultation of the heart reveals- No  Murmurs.   Neurologic Cranial Nerve exam:- CN III-XII intact(No nystagmus), symmetric smile.       Assessment & Plan:  For severe neck pain with radiating features, I am adding percocet since regimen of nsaids, gabapentin and muscle relaxant inadequate to control pan.   Rx zanaflex given today. Stop robaxin.  Will follow your mri of neck that you are getting today. Will see if what [facility]  MD recommends.  Will consider refilling presrciption of percocet if needed(may even place you on short contract temporarily). If you need to see neurosurgeon but if there is a delay.  Follow up date to be determined after reviewing mri report and Dr. Timothy Lasso update on mri result.  Mackie Pai, PA-C

## 2018-05-29 NOTE — Patient Instructions (Signed)
You have cervical radiculopathy (a pinched nerve in the neck). Continue the diclofenac, gabapentin, and robaxin. Consider cervical collar if severely painful. Simple range of motion exercises within limits of pain to prevent further stiffness. Heat 15 minutes at a time 3-4 times a day to help with spasms. Watch head position when on computers, texting, when sleeping in bed - should in line with back to prevent further nerve traction and irritation. We will go ahead with an MRI of your cervical spine - I suspect you're going to need either injection(s) or to see a spine surgeon because you haven't improved with conservative treatment but we will discuss after your MRI.

## 2018-05-30 ENCOUNTER — Encounter: Payer: Self-pay | Admitting: Family Medicine

## 2018-05-30 NOTE — Assessment & Plan Note (Signed)
concerning for cervical radiculopathy also with weakness, decreased triceps reflex.  Did not improve with prednisone dose packs, percocet, gabapentin, robaxin, diclofenac.  Will go ahead with MRI cervical spine.

## 2018-05-30 NOTE — Progress Notes (Signed)
PCP and consultation requested by: Ann Pai, PA-C  Subjective:   HPI: Patient is a 47 y.o. female here for neck pain.  4/30: Patient reports for about 3 weeks she has had right-sided neck pain radiating down to about the dorsal wrist. She states she did not have any injury or trauma but just woke up with the pain and could not move her head to the side. Pain feels like a fire and a soreness through this distribution. She has tried prednisone, Dilaudid, Percocet, Voltaren, and Lyrica. She takes Robaxin during the day with Zanaflex or Flexeril at nighttime. She did see the chiropractor once which helped. Pain level 7 out of 10 and sharp currently. No bowel or bladder dysfunction. No skin changes.  6/3: Patient reports she feels worse compared to last visit. Missed window of approval for MRI of her cervical spine. Pain is 8/10 and sharp in her neck on right side into right arm. Still goes into right arm to about the wrist. Also into right side of chest, scapula. Tried gabapentin, robaxin, diclofenac. No new injuries. No skin changes.  Past Medical History:  Diagnosis Date  . Acne   . Anemia    history of  . Anxiety   . Depression   . Diabetes mellitus    gest this pregnancy  . GERD (gastroesophageal reflux disease)    worse while pregnant  . Headache    Migraines  . Heart murmur    ? heart murmur in past  . History of blood transfusion 2013  . Hypertension     Current Outpatient Medications on File Prior to Visit  Medication Sig Dispense Refill  . aspirin 81 MG tablet Take 81 mg by mouth daily.    Marland Kitchen b complex vitamins capsule Take 1 capsule by mouth daily.    Marland Kitchen buPROPion (WELLBUTRIN XL) 300 MG 24 hr tablet Take 1 tablet by mouth daily.  12  . diclofenac (VOLTAREN) 75 MG EC tablet TAKE 1 TABLET BY MOUTH TWICE A DAY 20 tablet 0  . gabapentin (NEURONTIN) 100 MG capsule Take 1 capsule (100 mg total) by mouth 3 (three) times daily. 90 capsule 0  .  hydrochlorothiazide (MICROZIDE) 12.5 MG capsule TAKE 1 CAPSULE BY MOUTH  DAILY 90 capsule 0  . Lorcaserin HCl 10 MG TABS 1 tablet p.o. twice daily 60 tablet 0  . methocarbamol (ROBAXIN) 750 MG tablet TAKE 1 TABLET (750 MG TOTAL) BY MOUTH EVERY 8 (EIGHT) HOURS AS NEEDED FOR MUSCLE SPASMS. 30 tablet 0  . pregabalin (LYRICA) 50 MG capsule Take one cap PO TID (Patient taking differently: Take 50 mg by mouth 3 (three) times daily. ) 21 capsule 1  . zolpidem (AMBIEN) 10 MG tablet Take 10 mg by mouth at bedtime as needed for sleep.   5  . [DISCONTINUED] pantoprazole (PROTONIX) 40 MG tablet Take 40 mg by mouth daily before breakfast.       No current facility-administered medications on file prior to visit.     Past Surgical History:  Procedure Laterality Date  . CESAREAN SECTION     x 2  . CESAREAN SECTION  09/29/2012   Procedure: CESAREAN SECTION;  Surgeon: Ann Lex, MD;  Location: [facility];  Service: Obstetrics;  Laterality: N/A;  Repeat edc 10/12/12/REQUEST;Ann Frederick,Ann Frederick,Ann Frederick  . COLONOSCOPY    . ESOPHAGOGASTRODUODENOSCOPY  normal    7/12  . LAPAROSCOPIC VAGINAL HYSTERECTOMY WITH SALPINGECTOMY Bilateral 02/23/2016   Procedure: LAPAROSCOPIC ASSISTED VAGINAL HYSTERECTOMY WITH bilateral SALPINGECTOMY, right oophorectomy. laporotic repair of  incidental cystotomy.;  Surgeon: Louretta Shorten, MD;  Location: [facility];  Service: Gynecology;  Laterality: Bilateral;  . MOUTH SURGERY      Allergies  Allergen Reactions  . Azithromycin Nausea And Vomiting    diarrhea    Social History   Socioeconomic History  . Marital status: Married    Spouse name: Not on file  . Number of children: 2  . Years of education: Not on file  . Highest education level: Not on file  Occupational History  . Not on file  Social Needs  . Financial resource strain: Not on file  . Food insecurity:    Worry: Not on file    Inability: Not on file  . Transportation needs:    Medical: Not on file    Non-medical: Not on  file  Tobacco Use  . Smoking status: Never Smoker  . Smokeless tobacco: Never Used  Substance and Sexual Activity  . Alcohol use: Yes    Comment: 1 glass of wine per week prior to preg  . Drug use: No  . Sexual activity: Yes  Lifestyle  . Physical activity:    Days per week: Not on file    Minutes per session: Not on file  . Stress: Not on file  Relationships  . Social connections:    Talks on phone: Not on file    Gets together: Not on file    Attends religious service: Not on file    Active member of club or organization: Not on file    Attends meetings of clubs or organizations: Not on file    Relationship status: Not on file  . Intimate partner violence:    Fear of current or ex partner: Not on file    Emotionally abused: Not on file    Physically abused: Not on file    Forced sexual activity: Not on file  Other Topics Concern  . Not on file  Social History Narrative  . Not on file    Family History  Problem Relation Age of Onset  . Cancer Father        ? lung CA  . Kidney disease Father   . Depression Mother   . Hypertension Mother   . Diabetes Mother   . Obesity Mother   . Thyroid disease Mother   . Hydrocephalus Sister   . Colon cancer Neg Hx     BP (!) 143/100   Pulse (!) 109   Ht 5\' 2"  (1.575 m)   Wt 181 lb (82.1 kg)   LMP 02/11/2016 (Exact Date)   BMI 33.11 kg/m   Review of Systems: See HPI above.     Objective:  Physical Exam:  Gen: NAD, comfortable in exam room  Neck: No gross deformity, swelling, bruising. TTP right cervical paraspinal region.  No midline/bony TTP. ROM limited to 5 degrees extension, 20 bilateral lateral rotations, full flexion - all motions painful. BUE strength 5/5 except 3/5 right elbow extension.   Sensation intact to light touch.   Trace right triceps reflex.  2+ left triceps, bilateral biceps, brachioradialis tendon MSRs. 2+ radial pulse.   Assessment & Plan:  1. Neck pain - concerning for cervical  radiculopathy also with weakness, decreased triceps reflex.  Did not improve with prednisone dose packs, percocet, gabapentin, robaxin, diclofenac.  Will go ahead with MRI cervical spine.

## 2018-06-04 ENCOUNTER — Telehealth: Payer: BLUE CROSS/BLUE SHIELD | Admitting: Family

## 2018-06-04 DIAGNOSIS — R399 Unspecified symptoms and signs involving the genitourinary system: Secondary | ICD-10-CM

## 2018-06-04 MED ORDER — CEPHALEXIN 500 MG PO CAPS: 500.0000 mg | ORAL_CAPSULE | Freq: Two times a day (BID) | ORAL | 0 refills | Status: DC

## 2018-06-04 NOTE — Progress Notes (Signed)

## 2018-06-14 DIAGNOSIS — L718 Other rosacea: Secondary | ICD-10-CM | POA: Diagnosis not present

## 2018-06-14 DIAGNOSIS — L819 Disorder of pigmentation, unspecified: Secondary | ICD-10-CM | POA: Diagnosis not present

## 2018-06-20 ENCOUNTER — Other Ambulatory Visit: Payer: Self-pay | Admitting: Medical

## 2018-06-27 DIAGNOSIS — M542 Cervicalgia: Secondary | ICD-10-CM | POA: Diagnosis not present

## 2018-06-27 DIAGNOSIS — I10 Essential (primary) hypertension: Secondary | ICD-10-CM | POA: Diagnosis not present

## 2018-06-27 DIAGNOSIS — Z6834 Body mass index (BMI) 34.0-34.9, adult: Secondary | ICD-10-CM | POA: Diagnosis not present

## 2018-06-27 DIAGNOSIS — G959 Disease of spinal cord, unspecified: Secondary | ICD-10-CM | POA: Diagnosis not present

## 2018-07-04 DIAGNOSIS — Z01812 Encounter for preprocedural laboratory examination: Secondary | ICD-10-CM | POA: Diagnosis not present

## 2018-07-04 DIAGNOSIS — G959 Disease of spinal cord, unspecified: Secondary | ICD-10-CM | POA: Diagnosis not present

## 2018-07-12 DIAGNOSIS — M5001 Cervical disc disorder with myelopathy,  high cervical region: Secondary | ICD-10-CM | POA: Diagnosis not present

## 2018-07-12 DIAGNOSIS — M4802 Spinal stenosis, cervical region: Secondary | ICD-10-CM | POA: Diagnosis not present

## 2018-07-12 DIAGNOSIS — M4722 Other spondylosis with radiculopathy, cervical region: Secondary | ICD-10-CM | POA: Diagnosis not present

## 2018-07-12 DIAGNOSIS — M5011 Cervical disc disorder with radiculopathy,  high cervical region: Secondary | ICD-10-CM | POA: Diagnosis not present

## 2018-07-28 ENCOUNTER — Encounter: Payer: Self-pay | Admitting: Medical

## 2018-07-28 ENCOUNTER — Ambulatory Visit (INDEPENDENT_AMBULATORY_CARE_PROVIDER_SITE_OTHER): Payer: BLUE CROSS/BLUE SHIELD | Admitting: Medical

## 2018-07-28 VITALS — BP 128/83 | HR 118 | Temp 98.7°F | Resp 16 | Ht 62.0 in | Wt 181.4 lb

## 2018-07-28 DIAGNOSIS — I10 Essential (primary) hypertension: Secondary | ICD-10-CM | POA: Diagnosis not present

## 2018-07-28 DIAGNOSIS — R0683 Snoring: Secondary | ICD-10-CM | POA: Diagnosis not present

## 2018-07-28 DIAGNOSIS — M542 Cervicalgia: Secondary | ICD-10-CM | POA: Diagnosis not present

## 2018-07-28 NOTE — Progress Notes (Signed)
Subjective:    Patient ID: Ann Frederick, female    DOB: 12-28-1970, 47 y.o.   MRN: [id number]  HPI  Pt in for follow up.  Pt did have neck surgery on July 12, 2018.   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 Pt states pain appears to be resolved.   Pt states not doing any PT. 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 She will see surgeon on August 20,2019.  Not on any pain meds. She still has flexeril available to use if needed.  Pt has known history of severe snoring and her husband has recorded how severe snoring she has. She thinks she stops breathing. She states she wakes herself up at night.    She does not feel well rested when awakes.  Pt using saxenda for weight loss but just restarted.  Pt blood pressure seems better since surgery/less pain.  Also since I last saw patient she is still running high pulse rate. Today 118 pulse. Last ekg sinus tachycardia. No chest pain or syncope.  Review of Systems  Constitutional: Negative for chills, fatigue and fever.  HENT: Negative for congestion and ear pain.   Respiratory: Negative for cough, shortness of breath and wheezing.   Cardiovascular: Negative for chest pain and palpitations.  Gastrointestinal: Negative for abdominal pain.  Musculoskeletal: Negative for back pain, gait problem, myalgias and neck stiffness.  Skin: Negative for rash.  Neurological: Negative for dizziness, syncope, weakness, numbness and headaches.  Hematological: Negative for adenopathy. Does not bruise/bleed easily.  Psychiatric/Behavioral: Negative for behavioral problems.     Past Medical History:  Diagnosis Date  . Acne   . Anemia    history of  . Anxiety   . Depression   . Diabetes mellitus    gest this pregnancy  . GERD (gastroesophageal reflux disease)    worse while pregnant  . Headache    Migraines  . Heart murmur    ? heart murmur in past  . History of blood transfusion 2013    . Hypertension      Social History   Socioeconomic History  . Marital status: Married    Spouse name: Not on file  . Number of children: 2  . Years of education: Not on file  . Highest education level: Not on file  Occupational History  . Not on file  Social Needs  . Financial resource strain: Not on file  . Food insecurity:    Worry: Not on file    Inability: Not on file  . Transportation needs:    Medical: Not on file    Non-medical: Not on file  Tobacco Use  . Smoking status: Never Smoker  . Smokeless tobacco: Never Used  Substance and Sexual Activity  . Alcohol use: Yes    Comment: 1 glass of wine per week prior to preg  . Drug use: No  . Sexual activity: Yes  Lifestyle  . Physical activity:    Days per week: Not on file    Minutes per session: Not on file  . Stress: Not on file  Relationships  . Social connections:    Talks on phone: Not on file    Gets together: Not on file    Attends religious service: Not on file    Active member of club or organization: Not on file    Attends meetings of clubs or organizations: Not on file    Relationship status: Not  on file  . Intimate partner violence:    Fear of current or ex partner: Not on file    Emotionally abused: Not on file    Physically abused: Not on file    Forced sexual activity: Not on file  Other Topics Concern  . Not on file  Social History Narrative  . Not on file    Past Surgical History:  Procedure Laterality Date  . CESAREAN SECTION     x 2  . CESAREAN SECTION  09/29/2012   Procedure: CESAREAN SECTION;  Surgeon: Luz Lex, MD;  Location: [facility];  Service: Obstetrics;  Laterality: N/A;  Repeat edc 10/12/12/REQUEST;Chassity,Dee,Colleen  . COLONOSCOPY    . ESOPHAGOGASTRODUODENOSCOPY  normal    7/12  . LAPAROSCOPIC VAGINAL HYSTERECTOMY WITH SALPINGECTOMY Bilateral 02/23/2016   Procedure: LAPAROSCOPIC ASSISTED VAGINAL HYSTERECTOMY WITH bilateral SALPINGECTOMY, right oophorectomy. laporotic  repair of incidental cystotomy.;  Surgeon: Louretta Shorten, MD;  Location: [facility];  Service: Gynecology;  Laterality: Bilateral;  . MOUTH SURGERY      Family History  Problem Relation Age of Onset  . Cancer Father        ? lung CA  . Kidney disease Father   . Depression Mother   . Hypertension Mother   . Diabetes Mother   . Obesity Mother   . Thyroid disease Mother   . Hydrocephalus Sister   . Colon cancer Neg Hx     Allergies  Allergen Reactions  . Azithromycin Nausea And Vomiting    diarrhea    Current Outpatient Medications on File Prior to Visit  Medication Sig Dispense Refill  . aspirin 81 MG tablet Take 81 mg by mouth daily.    Marland Kitchen b complex vitamins capsule Take 1 capsule by mouth daily.    Marland Kitchen buPROPion (WELLBUTRIN XL) 300 MG 24 hr tablet Take 1 tablet by mouth daily.  12  . cyclobenzaprine (FLEXERIL) 10 MG tablet Take 10 mg by mouth 3 (three) times daily as needed.  0  . hydrochlorothiazide (MICROZIDE) 12.5 MG capsule TAKE 1 CAPSULE BY MOUTH  DAILY 90 capsule 0  . tizanidine (ZANAFLEX) 6 MG capsule Take 1 capsule (6 mg total) by mouth 3 (three) times daily as needed for muscle spasms. 30 capsule 0  . valACYclovir (VALTREX) 1000 MG tablet 1 TABLET BY MOUTH TWICE A DAY AS NEEDED TAKE TWO AT FIRST SIGN THEN TAKE TWO 12 HOURS LATER  4  . zolpidem (AMBIEN) 10 MG tablet Take 10 mg by mouth at bedtime as needed for sleep.   5  . [DISCONTINUED] pantoprazole (PROTONIX) 40 MG tablet Take 40 mg by mouth daily before breakfast.       No current facility-administered medications on file prior to visit.     BP 128/83   Pulse (!) 118   Temp 98.7 F (37.1 C) (Oral)   Resp 16   Ht 5\' 2"  (1.575 m)   Wt 181 lb 6.4 oz (82.3 kg)   LMP 02/11/2016 (Exact Date)   SpO2 99%   BMI 33.18 kg/m       Objective:   Physical Exam   General Mental Status- Alert. General Appearance- Not in acute distress.   Skin General: Color- Normal Color. Moisture- Normal Moisture.  Neck Carotid  Arteries- Normal color. Moisture- Normal Moisture. No carotid bruits. No JVD.  Chest and Lung Exam Auscultation: Breath Sounds:-Normal.  Cardiovascular Auscultation:Rythm- Regular. Murmurs & Other Heart Sounds:Auscultation of the heart reveals- No Murmurs.  Abdomen Inspection:-Inspeection Normal. Palpation/Percussion:Note:No mass. Palpation  and Percussion of the abdomen reveal- Non Tender, Non Distended + BS, no rebound or guarding.  Neurologic Cranial Nerve exam:- CN III-XII intact(No nystagmus), symmetric smile. Strength:- 5/5 equal and symmetric strength both upper and lower extremities.     Assessment & Plan:  For your severe snoring, I put in referral to pulmonologist.  For weight loss pt states she has just started saxenda. She states using lowest dose. Can refill when needed.  BP is good today. Continue hctz.  For hx of tachycardia. I want you to get fit bit and start to check pulse about 6 time a day. Will see if pulse drops to less than 100 or over 150. If constant tachy then refer to cardiologist. May need holter or low dose b-blocker.  Future cmp and lipid panel.  Follow up date to be determined.  Mackie Pai, PA-C

## 2018-07-28 NOTE — Patient Instructions (Addendum)
For your severe snoring, I put in referral to pulmonologist.  For weight loss pt states she has just started saxenda. She states using lowest dose. Can refill when needed.  BP is good today. Continue hctz.  For hx of tachycardia. I want you to get fit bit and start to check pulse about 6 time a day. Will see if pulse drops to less than 100 or over 150. If constant tachy then refer to cardiologist. May need holter or low dose b-blocker. Please my chart pulse update in 10-14 days.  Future cmp and lipid panel.  Follow up date to be determined.

## 2018-07-29 ENCOUNTER — Other Ambulatory Visit: Payer: Self-pay | Admitting: Medical

## 2018-08-07 ENCOUNTER — Other Ambulatory Visit: Payer: Self-pay | Admitting: Medical

## 2018-08-15 ENCOUNTER — Other Ambulatory Visit (INDEPENDENT_AMBULATORY_CARE_PROVIDER_SITE_OTHER): Payer: BLUE CROSS/BLUE SHIELD

## 2018-08-15 DIAGNOSIS — I10 Essential (primary) hypertension: Secondary | ICD-10-CM | POA: Diagnosis not present

## 2018-08-15 DIAGNOSIS — M542 Cervicalgia: Secondary | ICD-10-CM | POA: Diagnosis not present

## 2018-08-15 LAB — COMPREHENSIVE METABOLIC PANEL
ALT: 18 U/L (ref 0–35)
AST: 13 U/L (ref 0–37)
Albumin: 4 g/dL (ref 3.5–5.2)
Alkaline Phosphatase: 62 U/L (ref 39–117)
BUN: 7 mg/dL (ref 6–23)
CALCIUM: 9.7 mg/dL (ref 8.4–10.5)
CHLORIDE: 101 meq/L (ref 96–112)
CO2: 33 meq/L — AB (ref 19–32)
Creatinine, Ser: 0.88 mg/dL (ref 0.40–1.20)
GFR: 88.63 mL/min (ref >60.00)
GLUCOSE: 107 mg/dL — AB (ref 70–99)
POTASSIUM: 3.8 meq/L (ref 3.5–5.1)
Sodium: 140 mEq/L (ref 135–145)
Total Bilirubin: 0.7 mg/dL (ref 0.2–1.2)
Total Protein: 6.6 g/dL (ref 6.0–8.3)

## 2018-08-15 LAB — LIPID PANEL
Cholesterol: 171 mg/dL (ref 0–200)
HDL: 42.3 mg/dL (ref >39.00)
LDL Cholesterol: 98 mg/dL (ref 0–99)
NONHDL: 128.86
Total CHOL/HDL Ratio: 4
Triglycerides: 154 mg/dL — ABNORMAL HIGH (ref 0.0–149.0)
VLDL: 30.8 mg/dL (ref 0.0–40.0)

## 2018-08-29 ENCOUNTER — Ambulatory Visit (INDEPENDENT_AMBULATORY_CARE_PROVIDER_SITE_OTHER): Payer: BLUE CROSS/BLUE SHIELD | Admitting: Medical

## 2018-08-29 ENCOUNTER — Encounter: Payer: Self-pay | Admitting: Medical

## 2018-08-29 VITALS — BP 137/87 | HR 117 | Temp 98.5°F | Resp 16 | Ht 62.0 in | Wt 183.6 lb

## 2018-08-29 DIAGNOSIS — R110 Nausea: Secondary | ICD-10-CM | POA: Diagnosis not present

## 2018-08-29 DIAGNOSIS — E669 Obesity, unspecified: Secondary | ICD-10-CM

## 2018-08-29 DIAGNOSIS — R739 Hyperglycemia, unspecified: Secondary | ICD-10-CM | POA: Diagnosis not present

## 2018-08-29 DIAGNOSIS — R000 Tachycardia, unspecified: Secondary | ICD-10-CM

## 2018-08-29 DIAGNOSIS — R002 Palpitations: Secondary | ICD-10-CM | POA: Diagnosis not present

## 2018-08-29 LAB — HEMOGLOBIN A1C: Hgb A1c MFr Bld: 5.2 % (ref 4.6–6.5)

## 2018-08-29 LAB — COMPREHENSIVE METABOLIC PANEL
ALK PHOS: 58 U/L (ref 39–117)
ALT: 15 U/L (ref 0–35)
AST: 13 U/L (ref 0–37)
Albumin: 4 g/dL (ref 3.5–5.2)
BILIRUBIN TOTAL: 0.6 mg/dL (ref 0.2–1.2)
BUN: 7 mg/dL (ref 6–23)
CO2: 28 mEq/L (ref 19–32)
Calcium: 9.4 mg/dL (ref 8.4–10.5)
Chloride: 103 mEq/L (ref 96–112)
Creatinine, Ser: 0.88 mg/dL (ref 0.40–1.20)
GFR: 88.62 mL/min (ref >60.00)
GLUCOSE: 96 mg/dL (ref 70–99)
Potassium: 3.7 mEq/L (ref 3.5–5.1)
Sodium: 139 mEq/L (ref 135–145)
TOTAL PROTEIN: 6.6 g/dL (ref 6.0–8.3)

## 2018-08-29 MED ORDER — BUPROPION HCL ER (XL) 150 MG PO TB24: 150.0000 mg | ORAL_TABLET | Freq: Every day | ORAL | 0 refills | Status: DC

## 2018-08-29 NOTE — Patient Instructions (Addendum)
For history of tachycardia and palpitation, I do want you to stop caffeine.  Ann Frederick can possibly cause tachydardia per pharmacist. So not to use until you see cardiolgist. Wellbutrin also possible contributing factor so will taper you down to 150 mg Then plan to taper further in 2 weeks.  ekg in office today showed sinus tachycardia. Rate was 103.  Will get cmp and a1c today to evaluate sugar levels. If sugar level elevated metformin option to treat prediabetes and may help with weight loss.  For weight loss diet and exercise. Will hold saxenda presently and continue to check pulse to see if tachycardia despite not being on for 2 weeks.  Follow up date to be determined.

## 2018-08-29 NOTE — Progress Notes (Signed)
Subjective:    Patient ID: Ann Frederick, female    DOB: 11-05-1971, 47 y.o.   MRN: [id number]  HPI  Pt in for for evaluation.  Pt has persistently high pulse 100 or higher. Even at rest. Yesterday hiking at [facility] her max was 121. Today during morning walk pulse was 130.  Pt pulse has been high since spring of last year.   Pt has begun to brainstorm if she has side effects from medication. Pt has been on Wellbutrin in past for years. She states that it has helped her mood. Pt not on phentermine but was on in past when other provided rx'd.   Pt does report some occasional palpitation which she attributes this palpitation. Describes infrequent palpitation sensation.   Pt states saw on package insert of saxenda that tachycardia is possibility. But admits even when not on saxenda she feels like heart still beating fast.      Review of Systems  Constitutional: Negative for chills, fatigue and fever.  Respiratory: Negative for cough, chest tightness, shortness of breath and wheezing.   Cardiovascular: Negative for chest pain and palpitations.       See hpi  Gastrointestinal: Negative for abdominal pain, constipation, diarrhea, nausea and vomiting.  Musculoskeletal: Negative for back pain.  Skin: Negative for rash.  Neurological: Negative for dizziness, speech difficulty, weakness, numbness and headaches.  Hematological: Negative for adenopathy. Does not bruise/bleed easily.  Psychiatric/Behavioral: Negative for behavioral problems, confusion, hallucinations and self-injury. The patient is not nervous/anxious.        Mood controlled with wellbutrin.     Past Medical History:  Diagnosis Date  . Acne   . Anemia    history of  . Anxiety   . Depression   . Diabetes mellitus    gest this pregnancy  . GERD (gastroesophageal reflux disease)    worse while pregnant  . Headache    Migraines  . Heart murmur    ? heart murmur in past  . History of blood transfusion 2013  .  Hypertension      Social History   Socioeconomic History  . Marital status: Married    Spouse name: Not on file  . Number of children: 2  . Years of education: Not on file  . Highest education level: Not on file  Occupational History  . Not on file  Social Needs  . Financial resource strain: Not on file  . Food insecurity:    Worry: Not on file    Inability: Not on file  . Transportation needs:    Medical: Not on file    Non-medical: Not on file  Tobacco Use  . Smoking status: Never Smoker  . Smokeless tobacco: Never Used  Substance and Sexual Activity  . Alcohol use: Yes    Comment: 1 glass of wine per week prior to preg  . Drug use: No  . Sexual activity: Yes  Lifestyle  . Physical activity:    Days per week: Not on file    Minutes per session: Not on file  . Stress: Not on file  Relationships  . Social connections:    Talks on phone: Not on file    Gets together: Not on file    Attends religious service: Not on file    Active member of club or organization: Not on file    Attends meetings of clubs or organizations: Not on file    Relationship status: Not on file  . Intimate partner  violence:    Fear of current or ex partner: Not on file    Emotionally abused: Not on file    Physically abused: Not on file    Forced sexual activity: Not on file  Other Topics Concern  . Not on file  Social History Narrative  . Not on file    Past Surgical History:  Procedure Laterality Date  . CESAREAN SECTION     x 2  . CESAREAN SECTION  09/29/2012   Procedure: CESAREAN SECTION;  Surgeon: Luz Lex, MD;  Location: [facility];  Service: Obstetrics;  Laterality: N/A;  Repeat edc 10/12/12/REQUEST;Chassity,Dee,Colleen  . COLONOSCOPY    . ESOPHAGOGASTRODUODENOSCOPY  normal    7/12  . LAPAROSCOPIC VAGINAL HYSTERECTOMY WITH SALPINGECTOMY Bilateral 02/23/2016   Procedure: LAPAROSCOPIC ASSISTED VAGINAL HYSTERECTOMY WITH bilateral SALPINGECTOMY, right oophorectomy. laporotic repair  of incidental cystotomy.;  Surgeon: Louretta Shorten, MD;  Location: [facility];  Service: Gynecology;  Laterality: Bilateral;  . MOUTH SURGERY      Family History  Problem Relation Age of Onset  . Cancer Father        ? lung CA  . Kidney disease Father   . Depression Mother   . Hypertension Mother   . Diabetes Mother   . Obesity Mother   . Thyroid disease Mother   . Hydrocephalus Sister   . Colon cancer Neg Hx     Allergies  Allergen Reactions  . Azithromycin Nausea And Vomiting    diarrhea    Current Outpatient Medications on File Prior to Visit  Medication Sig Dispense Refill  . aspirin 81 MG tablet Take 81 mg by mouth daily.    Marland Kitchen b complex vitamins capsule Take 1 capsule by mouth daily.    . B-D ULTRAFINE III SHORT PEN 31G X 8 MM MISC USE WITH SAENDA AS DIRECTED 100 each 6  . buPROPion (WELLBUTRIN XL) 300 MG 24 hr tablet Take 1 tablet by mouth daily.  12  . cyclobenzaprine (FLEXERIL) 10 MG tablet Take 10 mg by mouth 3 (three) times daily as needed.  0  . hydrochlorothiazide (MICROZIDE) 12.5 MG capsule TAKE 1 CAPSULE BY MOUTH  DAILY 90 capsule 0  . tizanidine (ZANAFLEX) 6 MG capsule Take 1 capsule (6 mg total) by mouth 3 (three) times daily as needed for muscle spasms. 30 capsule 0  . valACYclovir (VALTREX) 1000 MG tablet 1 TABLET BY MOUTH TWICE A DAY AS NEEDED TAKE TWO AT FIRST SIGN THEN TAKE TWO 12 HOURS LATER  4  . zolpidem (AMBIEN) 10 MG tablet Take 10 mg by mouth at bedtime as needed for sleep.   5  . [DISCONTINUED] pantoprazole (PROTONIX) 40 MG tablet Take 40 mg by mouth daily before breakfast.       No current facility-administered medications on file prior to visit.     BP 137/87   Pulse (!) 117   Temp 98.5 F (36.9 C) (Oral)   Resp 16   Ht 5\' 2"  (1.575 m)   Wt 183 lb 9.6 oz (83.3 kg)   LMP 02/11/2016 (Exact Date)   SpO2 99%   BMI 33.58 kg/m       Objective:   Physical Exam  General Mental Status- Alert. General Appearance- Not in acute distress.    Skin General: Color- Normal Color. Moisture- Normal Moisture.  Neck Carotid Arteries- Normal color. Moisture- Normal Moisture. No carotid bruits. No JVD.  Chest and Lung Exam Auscultation: Breath Sounds:-Normal.  Cardiovascular Auscultation:Rythm- Regular. Murmurs & Other Heart Sounds:Auscultation  of the heart reveals- No Murmurs.  Abdomen Inspection:-Inspeection Normal. Palpation/Percussion:Note:No mass. Palpation and Percussion of the abdomen reveal- Non Tender, Non Distended + BS, no rebound or guarding.   Neurologic Cranial Nerve exam:- CN III-XII intact(No nystagmus), symmetric smile. Strength:- 5/5 equal and symmetric strength both upper and lower extremities.      Assessment & Plan:  For history of tachycardia and palpitation, I do want you to stop caffeine.  Kirke Shaggy can possibly cause tachydardia per pharmacist. So not to use until you see cardiolgist. Wellbutrin also possible contributing factor so will taper you down to 150 mg Then plan to taper further in 2 weeks.  ekg in office today showed sinus tachycardia. Rate was 103.  Will get cmp and a1c today to evaluate sugar levels. If sugar level elevated metformin option to treat prediabetes and may help with weight loss.  For weight loss diet and exercise. Will hold saxenda presently and continue to check pulse to see if tachycardia despite not being on for 2 weeks.  Follow up date to be determined.  25 minutes spent with pt. 50% of time discussed possible causes of her tachycardia including the work up going forward and potential treatment.(possible b-blocker low dose)  Mackie Pai, PA-C

## 2018-08-30 NOTE — Progress Notes (Signed)
Cardiology Office Note:    Date:  08/31/2018   ID:  Dillon Bjork, DOB [birth year], MRN [id number]  PCP:  Mackie Pai, PA-C  Cardiologist:  Buford Dresser, MD PhD  Referring MD: Elise Benne   CC: Tachycardia  History of Present Illness:    Ann Frederick is a 47 y.o. female with a hx of hypertension, diabetes who is seen as a new consult at the request of Saguier, Iris Pert for the evaluation and management of tachycardia and palpitations.  Patient concerns: for about a year, noted that when exercising her heart rate would get to 150. When she takes her BP, HR usually 80s. Noticed that her heart rate was more into the 100s this spring/summer. Had an episode at the office with a heart rate of 145. She isn't good about hydrating, and she does take HCTZ. Also on wellbutrin, likes how she does on it but worries that it is her heart; trying to taper down dose.  She bought a heart rate tracker, resting HR 110, up to 110-120 with activity. Had a cervical fusion this summer and been less active. When she is more routinely active, her resting HR goes back to the 80s.   She feels a rare single extra beat, less than once/week. Not very bothersome.  Also concerned about her blood pressure. Working on diet and exercise. Intermittently takes saxenda (not for diabetes), not routinely. Was off of for 8 weeks around surgery and didn't notice much change in her heart rate.   Has had rare chest pain in the past, but thought to be musculoskeletal. None currently. No shortness of breath, PND, orthopnea, LE edema, syncope.  Past Medical History:  Diagnosis Date  . Acne   . Anemia    history of  . Anxiety   . Depression   . Diabetes mellitus    gest this pregnancy  . GERD (gastroesophageal reflux disease)    worse while pregnant  . Headache    Migraines  . Heart murmur    ? heart murmur in past  . History of blood transfusion 2013  . Hypertension     Past Surgical History:    Procedure Laterality Date  . CESAREAN SECTION     x 2  . CESAREAN SECTION  09/29/2012   Procedure: CESAREAN SECTION;  Surgeon: Luz Lex, MD;  Location: [facility];  Service: Obstetrics;  Laterality: N/A;  Repeat edc 10/12/12/REQUEST;Chassity,Dee,Colleen  . COLONOSCOPY    . ESOPHAGOGASTRODUODENOSCOPY  normal    7/12  . LAPAROSCOPIC VAGINAL HYSTERECTOMY WITH SALPINGECTOMY Bilateral 02/23/2016   Procedure: LAPAROSCOPIC ASSISTED VAGINAL HYSTERECTOMY WITH bilateral SALPINGECTOMY, right oophorectomy. laporotic repair of incidental cystotomy.;  Surgeon: Louretta Shorten, MD;  Location: [facility];  Service: Gynecology;  Laterality: Bilateral;  . MOUTH SURGERY      Current Medications: Current Outpatient Medications on File Prior to Visit  Medication Sig  . aspirin 81 MG tablet Take 81 mg by mouth daily.  Marland Kitchen b complex vitamins capsule Take 1 capsule by mouth daily.  . B-D ULTRAFINE III SHORT PEN 31G X 8 MM MISC USE WITH SAENDA AS DIRECTED  . buPROPion (WELLBUTRIN XL) 150 MG 24 hr tablet Take 1 tablet (150 mg total) by mouth daily.  . cyclobenzaprine (FLEXERIL) 10 MG tablet Take 10 mg by mouth 3 (three) times daily as needed.  . hydrochlorothiazide (MICROZIDE) 12.5 MG capsule TAKE 1 CAPSULE BY MOUTH  DAILY  . valACYclovir (VALTREX) 1000 MG tablet 1 TABLET BY MOUTH TWICE A  DAY AS NEEDED TAKE TWO AT FIRST SIGN THEN TAKE TWO 12 HOURS LATER  . zolpidem (AMBIEN) 10 MG tablet Take 10 mg by mouth at bedtime as needed for sleep.    No current facility-administered medications on file prior to visit.      Allergies:   Azithromycin   Social History   Socioeconomic History  . Marital status: Married    Spouse name: Not on file  . Number of children: 2  . Years of education: Not on file  . Highest education level: Not on file  Occupational History  . Not on file  Social Needs  . Financial resource strain: Not on file  . Food insecurity:    Worry: Not on file    Inability: Not on file  . Transportation  needs:    Medical: Not on file    Non-medical: Not on file  Tobacco Use  . Smoking status: Never Smoker  . Smokeless tobacco: Never Used  Substance and Sexual Activity  . Alcohol use: Yes    Comment: 1 glass of wine per week prior to preg  . Drug use: No  . Sexual activity: Yes  Lifestyle  . Physical activity:    Days per week: Not on file    Minutes per session: Not on file  . Stress: Not on file  Relationships  . Social connections:    Talks on phone: Not on file    Gets together: Not on file    Attends religious service: Not on file    Active member of club or organization: Not on file    Attends meetings of clubs or organizations: Not on file    Relationship status: Not on file  Other Topics Concern  . Not on file  Social History Narrative  . Not on file  Background in nursing, very high health literacy.   Family History: The patient's family history includes Cancer in her father; Depression in her mother; Diabetes in her mother; Hydrocephalus in her sister; Hypertension in her mother; Kidney disease in her father; Obesity in her mother; Thyroid disease in her mother. There is no history of Colon cancer. Mom has heart disease in her late 94s, had a heart cath and stent last year, has diabetes and hypertension. No early CAD.  ROS:   Please see the history of present illness.  Additional pertinent ROS: Review of Systems  Constitutional: Negative for chills, fever and weight loss.  HENT: Negative for ear pain and hearing loss.   Eyes: Negative for blurred vision and pain.  Respiratory: Negative for hemoptysis and shortness of breath.   Cardiovascular: Positive for palpitations. Negative for chest pain, orthopnea, claudication, leg swelling and PND.  Gastrointestinal: Negative for blood in stool and melena.  Genitourinary: Negative for dysuria and hematuria.  Musculoskeletal: Positive for neck pain. Negative for falls.       Recent cervical fusion, recovering well  Skin:  Negative for rash.  Neurological: Positive for headaches. Negative for focal weakness and loss of consciousness.  Endo/Heme/Allergies: Does not bruise/bleed easily.     EKGs/Labs/Other Studies Reviewed:    The following studies were reviewed today: Prior notes, ECG (sinus tachycardia at 103 bpm)  EKG:  EKG is ordered today.  The ekg ordered today demonstrates NSR at 90 bpm  Recent Labs: 04/19/2018: Hemoglobin 14.0; Platelets 338 08/29/2018: ALT 15; BUN 7; Creatinine, Ser 0.88; Potassium 3.7; Sodium 139  Recent Lipid Panel    Component Value Date/Time   CHOL 171 08/15/2018  0829   TRIG 154.0 (H) 08/15/2018 0829   HDL 42.30 08/15/2018 0829   CHOLHDL 4 08/15/2018 0829   VLDL 30.8 08/15/2018 0829   LDLCALC 98 08/15/2018 0829   LDLDIRECT 101.0 11/10/2017 0840    Physical Exam:    VS:  BP 130/76   Pulse 90   Ht 5\' 2"  (1.575 m)   Wt 181 lb (82.1 kg)   LMP 02/11/2016 (Exact Date)   BMI 33.11 kg/m     Wt Readings from Last 3 Encounters:  08/31/18 181 lb (82.1 kg)  08/29/18 183 lb 9.6 oz (83.3 kg)  07/28/18 181 lb 6.4 oz (82.3 kg)     GEN: Well nourished, well developed in no acute distress HEENT: Normal NECK: No JVD; No carotid bruits LYMPHATICS: No lymphadenopathy CARDIAC: regular rhythm, normal S1 and S2, no murmurs, rubs, gallops. Radial and DP pulses 2+ bilaterally. RESPIRATORY:  Clear to auscultation without rales, wheezing or rhonchi  ABDOMEN: Soft, non-tender, non-distended MUSCULOSKELETAL:  No edema; No deformity  SKIN: Warm and dry NEUROLOGIC:  Alert and oriented x 3 PSYCHIATRIC:  Normal affect   ASSESSMENT:    1. Palpitation   2. Sinus tachycardia   3. Counseling on health promotion and disease prevention   4. Essential hypertension    PLAN:    1. Palpitations, sinus tachycardia: -given normal variability, improvement in her resting heart rate when she is active/exercise conditioned, lack of serious symptoms, would favor conservative management for  now. -encouraged her to gradually increase her activity level, as cleared by her surgeon. Activities such as walking, biking, or swimming are ideal. Does not need to be high intensity to get benefit -wellbutrin may be raising her baseline heart rate, but it doesn't need to be rapidly weaned at this time. I agree with the plan to gradually decrease over time and see how she does. -encouraged hydration -will hold off on a monitor for now. If her heart rate doesn't improve, or if the sensation of palpitations/extra beats worsens, would get an event monitor at the next visit. Would also be helpful to know her overall heart rate trends.  2. Hypertension: at goal today. She likes taking the HCTZ, but if dehydration becomes an issue, would change to amlodipine or lisinopril.  3. Prevention: counseled on risk factors, exercise, lifestyle. Her ASCVD risk is very low, already on aspirin 81 mg per her choice. No indication for preventative medication at this time. If her risk score increases, or if she develops diabetes in the future, would recommend a statin over baby aspirin for prevention.  The 10-year ASCVD risk score Mikey Bussing DC Brooke Bonito., et al., 2013) is: 1.4%   Values used to calculate the score:     Age: 35 years     Sex: Female     Is Non-Hispanic African American: No     Diabetic: No     Tobacco smoker: No     Systolic Blood Pressure: 244 mmHg     Is BP treated: Yes     HDL Cholesterol: 42.3 mg/dL     Total Cholesterol: 171 mg/dL  Plan for follow up:  Medication Adjustments/Labs and Tests Ordered: Current medicines are reviewed at length with the patient today.  Concerns regarding medicines are outlined above.  Orders Placed This Encounter  Procedures  . EKG 12-Lead   No orders of the defined types were placed in this encounter.   Patient Instructions  Medication Instructions: Your physician recommends that you continue on your current medications as directed.  If you need a refill on  your cardiac medications before your next appointment, please call your pharmacy.   Labwork: None  Procedures/Testing: None  Follow-Up: Your physician wants you to follow-up in 3 months with Dr. Harrell Gave.  Special Instructions:    Thank you for choosing [facility] at [facility]!!       Signed, Buford Dresser, MD PhD 08/31/2018 1:21 PM    [facility]

## 2018-08-31 ENCOUNTER — Encounter: Payer: Self-pay | Admitting: Cardiology

## 2018-08-31 ENCOUNTER — Ambulatory Visit (INDEPENDENT_AMBULATORY_CARE_PROVIDER_SITE_OTHER): Payer: 59 | Admitting: Cardiology

## 2018-08-31 VITALS — BP 130/76 | HR 90 | Ht 62.0 in | Wt 181.0 lb

## 2018-08-31 DIAGNOSIS — I10 Essential (primary) hypertension: Secondary | ICD-10-CM | POA: Diagnosis not present

## 2018-08-31 DIAGNOSIS — R002 Palpitations: Secondary | ICD-10-CM | POA: Diagnosis not present

## 2018-08-31 DIAGNOSIS — Z7189 Other specified counseling: Secondary | ICD-10-CM | POA: Diagnosis not present

## 2018-08-31 DIAGNOSIS — R000 Tachycardia, unspecified: Secondary | ICD-10-CM

## 2018-08-31 NOTE — Patient Instructions (Addendum)
Medication Instructions: Your physician recommends that you continue on your current medications as directed.    If you need a refill on your cardiac medications before your next appointment, please call your pharmacy.   Labwork: None  Procedures/Testing: None  Follow-Up: Your physician wants you to follow-up in 3 months with Dr. Harrell Gave.  Special Instructions:    Thank you for choosing [facility] at [facility]!!

## 2018-09-01 ENCOUNTER — Other Ambulatory Visit: Payer: Self-pay | Admitting: Medical

## 2018-09-04 ENCOUNTER — Telehealth: Payer: Self-pay | Admitting: Medical

## 2018-09-04 MED ORDER — ATORVASTATIN CALCIUM 10 MG PO TABS: 10.0000 mg | ORAL_TABLET | Freq: Every day | ORAL | 3 refills | Status: DC

## 2018-09-04 NOTE — Telephone Encounter (Signed)
Rx refill of lipitor sent in.

## 2018-09-04 NOTE — Telephone Encounter (Signed)
Went ahead and refilled her lipitor after reviewing prior lipid panels.

## 2018-09-04 NOTE — Telephone Encounter (Signed)
Pt requesting Atorvastatin no longer on medication list. Please advise.

## 2018-09-12 DIAGNOSIS — M542 Cervicalgia: Secondary | ICD-10-CM | POA: Diagnosis not present

## 2018-09-14 ENCOUNTER — Ambulatory Visit (INDEPENDENT_AMBULATORY_CARE_PROVIDER_SITE_OTHER): Payer: 59 | Admitting: Pulmonary Disease

## 2018-09-14 ENCOUNTER — Encounter: Payer: Self-pay | Admitting: Pulmonary Disease

## 2018-09-14 VITALS — BP 124/90 | HR 128 | Ht 62.0 in | Wt 181.0 lb

## 2018-09-14 DIAGNOSIS — G4733 Obstructive sleep apnea (adult) (pediatric): Secondary | ICD-10-CM

## 2018-09-14 NOTE — Patient Instructions (Signed)
Schedule home sleep study. We discussed treatment options 

## 2018-09-14 NOTE — Assessment & Plan Note (Signed)
Given excessive daytime somnolence, narrow pharyngeal exam, witnessed apneas & loud snoring, obstructive sleep apnea is very likely & an overnight polysomnogram will be scheduled as a home study. The pathophysiology of obstructive sleep apnea , it's cardiovascular consequences & modes of treatment including CPAP were discused with the patient in detail & they evidenced understanding.  Pretest probability is intermediate  

## 2018-09-14 NOTE — Progress Notes (Signed)
Subjective:    Patient ID: Ann Frederick, female    DOB: [birth year], 47 y.o.   MRN: [id number]  HPI  Chief Complaint  Patient presents with  . Sleep Consult    Referred by PCP for possible OSA. Per patient's husband she snores at night. Denies ever having a SS done before. She does becomes sleepy during the day.     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 She has hypertension and is undergoing evaluation for palpitations has just seen cardiology. Her husband has noted snoring for the past year and has witnessed apneas.  She reports excessive daytime somnolence and fatigue. Epworth sleepiness score is 13 and she reports sleepiness while sitting and reading, watching TV, lying down to rest in the afternoons was a passenger in a car.  She naps for about 30 minutes on the couch, naps are refreshing. Bedtime is around midnight, she takes Ambien about 4 times a week and melatonin occasionally, sleep latency can be 20 to 30 minutes, she sleeps on her back with one pillow, especially after her recent neck surgery, reports 2-3 nocturnal awakenings, denies nocturia and is out of bed at 7 AM feeling tired without dryness of mouth or headaches  There is no history suggestive of cataplexy, sleep paralysis or parasomnias  Her weight has not fluctuated much over the last few years. 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  She lives with her husband and kids, youngest son is 3 years old  Past Medical History:  Diagnosis Date  . Acne   . Anemia    history of  . Anxiety   . Depression   . Diabetes mellitus    gest this pregnancy  . GERD (gastroesophageal reflux disease)    worse while pregnant  . Headache    Migraines  . Heart murmur    ? heart murmur in past  . History of blood transfusion 2013  . Hypertension    Past Surgical History:  Procedure Laterality Date  . CESAREAN SECTION     x 2    . CESAREAN SECTION  09/29/2012   Procedure: CESAREAN SECTION;  Surgeon: Luz Lex, MD;  Location: [facility];  Service: Obstetrics;  Laterality: N/A;  Repeat edc 10/12/12/REQUEST;Chassity,Dee,Colleen  . COLONOSCOPY    . ESOPHAGOGASTRODUODENOSCOPY  normal    7/12  . LAPAROSCOPIC VAGINAL HYSTERECTOMY WITH SALPINGECTOMY Bilateral 02/23/2016   Procedure: LAPAROSCOPIC ASSISTED VAGINAL HYSTERECTOMY WITH bilateral SALPINGECTOMY, right oophorectomy. laporotic repair of incidental cystotomy.;  Surgeon: Louretta Shorten, MD;  Location: [facility];  Service: Gynecology;  Laterality: Bilateral;  . MOUTH SURGERY      Allergies  Allergen Reactions  . Azithromycin Nausea And Vomiting    diarrhea    Social History   Socioeconomic History  . Marital status: Married    Spouse name: Not on file  . Number of children: 2  . Years of education: Not on file  . Highest education level: Not on file  Occupational History  . Not on file  Social Needs  . Financial resource strain: Not on file  . Food insecurity:    Worry: Not on file    Inability: Not on file  . Transportation needs:    Medical: Not on file    Non-medical: Not on file  Tobacco Use  . Smoking status: Never Smoker  . Smokeless tobacco: Never Used  Substance and Sexual Activity  .  Alcohol use: Yes    Comment: 1 glass of wine per week prior to preg  . Drug use: No  . Sexual activity: Yes  Lifestyle  . Physical activity:    Days per week: Not on file    Minutes per session: Not on file  . Stress: Not on file  Relationships  . Social connections:    Talks on phone: Not on file    Gets together: Not on file    Attends religious service: Not on file    Active member of club or organization: Not on file    Attends meetings of clubs or organizations: Not on file    Relationship status: Not on file  . Intimate partner violence:    Fear of current or ex partner: Not on file    Emotionally abused: Not on file    Physically abused: Not on file     Forced sexual activity: Not on file  Other Topics Concern  . Not on file  Social History Narrative  . Not on file      Family History  Problem Relation Age of Onset  . Cancer Father        ? lung CA  . Kidney disease Father   . Depression Mother   . Hypertension Mother   . Diabetes Mother   . Obesity Mother   . Thyroid disease Mother   . Hydrocephalus Sister   . Colon cancer Neg Hx      Review of Systems   Constitutional: negative for anorexia, fevers and sweats  Eyes: negative for irritation, redness and visual disturbance  Ears, nose, mouth, throat, and face: negative for earaches, epistaxis, nasal congestion and sore throat  Respiratory: negative for cough, dyspnea on exertion, sputum and wheezing  Cardiovascular: negative for chest pain, dyspnea, lower extremity edema, orthopnea, palpitations and syncope  Gastrointestinal: negative for abdominal pain, constipation, diarrhea, melena, nausea and vomiting  Genitourinary:negative for dysuria, frequency and hematuria  Hematologic/lymphatic: negative for bleeding, easy bruising and lymphadenopathy  Musculoskeletal:negative for arthralgias, muscle weakness and stiff joints  Neurological: negative for coordination problems, gait problems, headaches and weakness  Endocrine: negative for diabetic symptoms including polydipsia, polyuria and weight loss     Objective:   Physical Exam  Gen. Pleasant, obese, in no distress ENT - class 2 airway, mild enlarged tonsils, no post nasal drip Neck: No JVD, no thyromegaly, no carotid bruits Lungs: no use of accessory muscles, no dullness to percussion, decreased without rales or rhonchi  Cardiovascular: Rhythm regular, heart sounds  normal, no murmurs or gallops, no peripheral edema Musculoskeletal: No deformities, no cyanosis or clubbing , no tremors       Assessment & Plan:

## 2018-09-24 ENCOUNTER — Other Ambulatory Visit: Payer: Self-pay | Admitting: Medical

## 2018-09-28 DIAGNOSIS — G4733 Obstructive sleep apnea (adult) (pediatric): Secondary | ICD-10-CM

## 2018-09-29 ENCOUNTER — Telehealth: Payer: Self-pay | Admitting: Pulmonary Disease

## 2018-09-29 ENCOUNTER — Other Ambulatory Visit: Payer: Self-pay | Admitting: *Deleted

## 2018-09-29 DIAGNOSIS — G4733 Obstructive sleep apnea (adult) (pediatric): Secondary | ICD-10-CM

## 2018-09-29 NOTE — Telephone Encounter (Signed)
Per RA, HST shows moderate OSA with 18 events per hour. He suggests a RX of auto cpap 5-12cm, mask of choice, OV in 6 weeks. She can try a dental appliance but he is not sure this will suffice.

## 2018-10-10 NOTE — Telephone Encounter (Signed)
Left message for patient to call back for results.  

## 2018-10-11 NOTE — Telephone Encounter (Signed)
Called and spoke with pt letting her know the results of the HST and stated to her the recs per RA.  Pt expressed understanding and stated she would start on CPAP.  I have placed the order for the cpap and also scheduled pt a follow up with RA for a follow up after CPAP start. Nothing further needed.

## 2018-10-11 NOTE — Telephone Encounter (Signed)
Pt is calling back [phone]

## 2018-10-18 DIAGNOSIS — Z23 Encounter for immunization: Secondary | ICD-10-CM | POA: Diagnosis not present

## 2018-10-31 DIAGNOSIS — M542 Cervicalgia: Secondary | ICD-10-CM | POA: Diagnosis not present

## 2018-10-31 DIAGNOSIS — Z6833 Body mass index (BMI) 33.0-33.9, adult: Secondary | ICD-10-CM | POA: Diagnosis not present

## 2018-10-31 DIAGNOSIS — I10 Essential (primary) hypertension: Secondary | ICD-10-CM | POA: Diagnosis not present

## 2018-11-15 DIAGNOSIS — G4733 Obstructive sleep apnea (adult) (pediatric): Secondary | ICD-10-CM | POA: Diagnosis not present

## 2018-11-20 IMAGING — MR MR CERVICAL SPINE W/O CM
5 series · 32 of 48 positions shown · non-contrast
Comparison: Radiography 04/14/2018

CLINICAL DATA: Neck pain and stiffness. Right arm pain and
weakness, 2 months duration.

EXAM:
MRI CERVICAL SPINE WITHOUT CONTRAST
TECHNIQUE: Multiplanar, multisequence MR imaging of the cervical spine was
performed. No intravenous contrast was administered.

[Series 2: T2 · sagittal · 3.0mm · 0.56mm/px · 6 of 13 slices shown (1 of 2)]
[im 1/13]
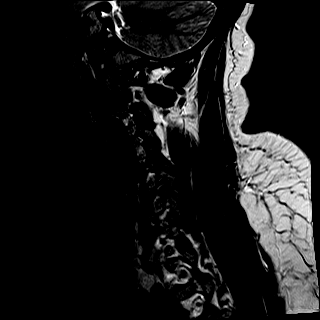
[im 3/13]
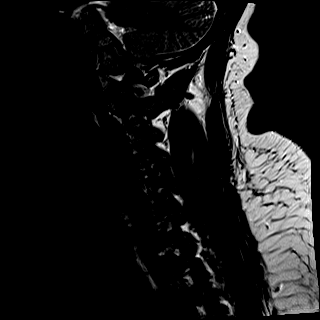
[im 5/13]
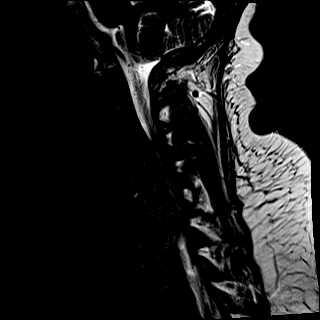
[im 8/13]
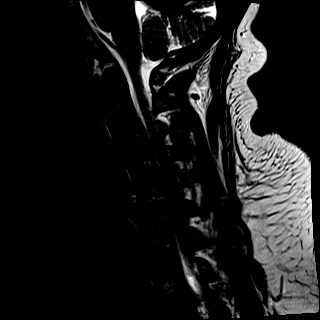
[im 10/13]
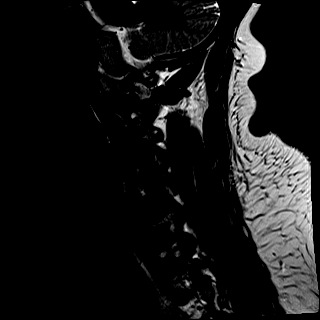
[im 13/13]
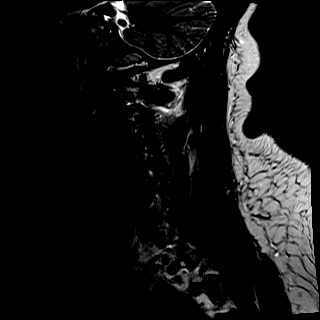

[Series 3: T1 · sagittal · 3.0mm · 0.70mm/px · 7 of 13 slices shown]
[im 1/13]
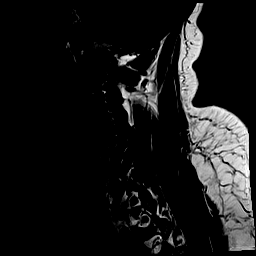
[im 3/13]
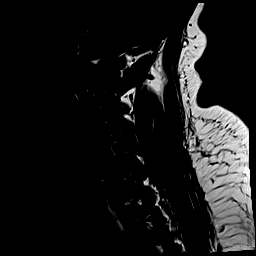
[im 5/13]
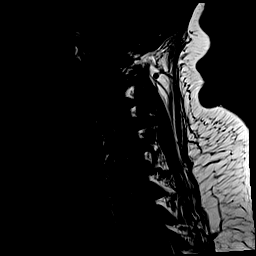
[im 7/13]
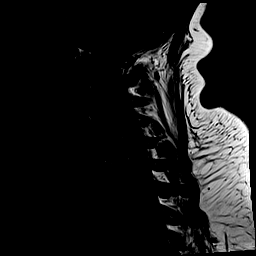
[im 9/13]
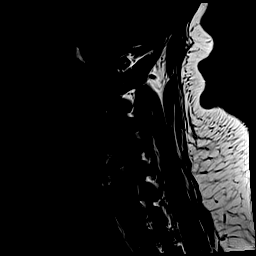
[im 11/13]
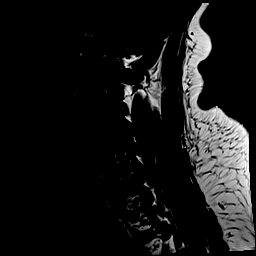
[im 13/13]
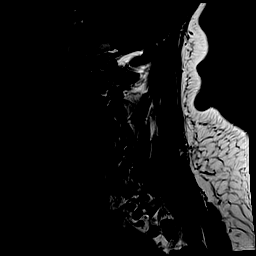

[Series 4: STIR · sagittal · 3.0mm · 0.35mm/px · 7 of 13 slices shown]
[im 1/13]
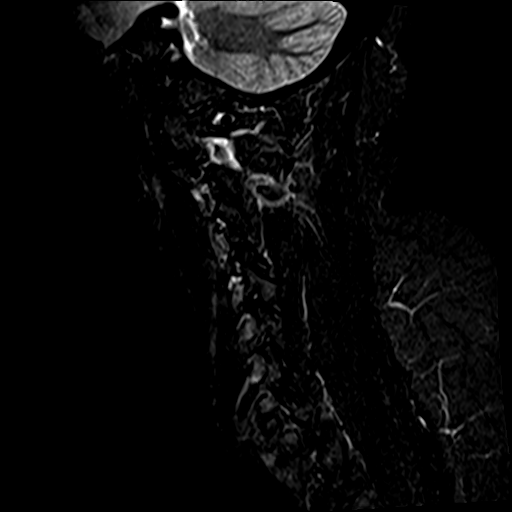
[im 3/13]
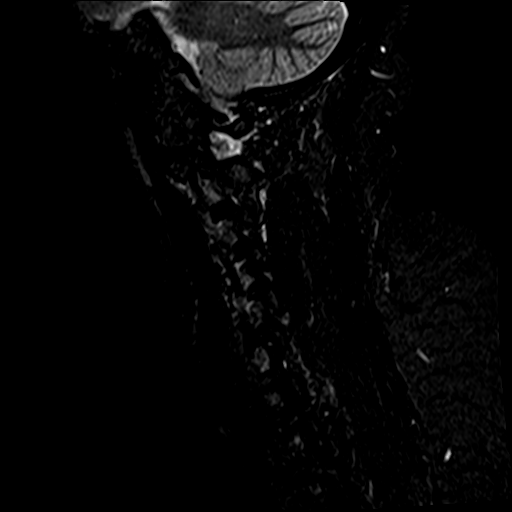
[im 5/13]
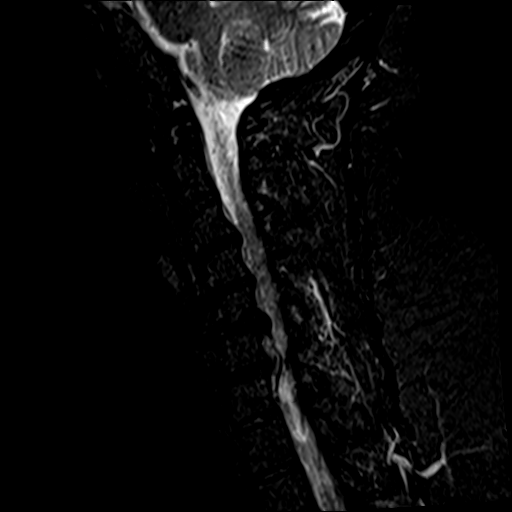
[im 7/13]
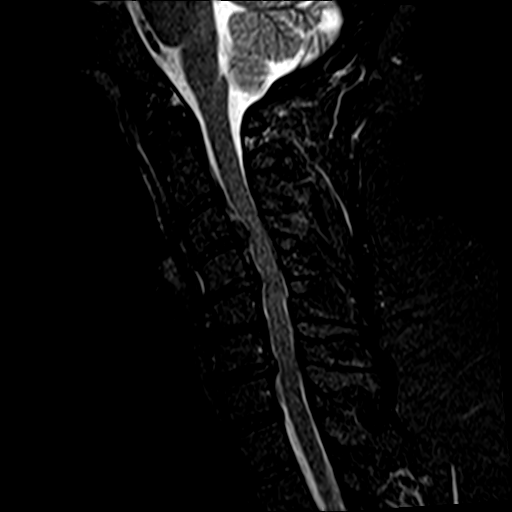
[im 9/13]
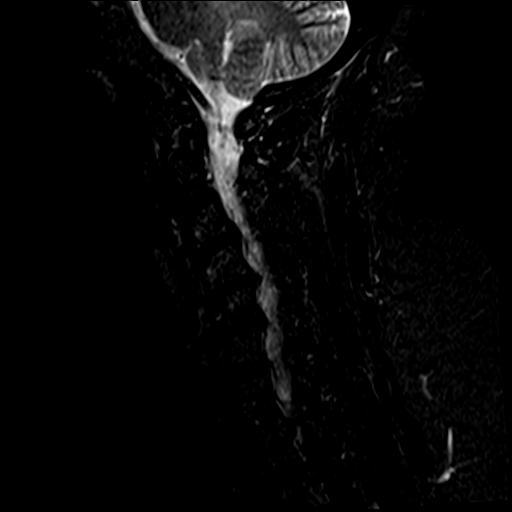
[im 11/13]
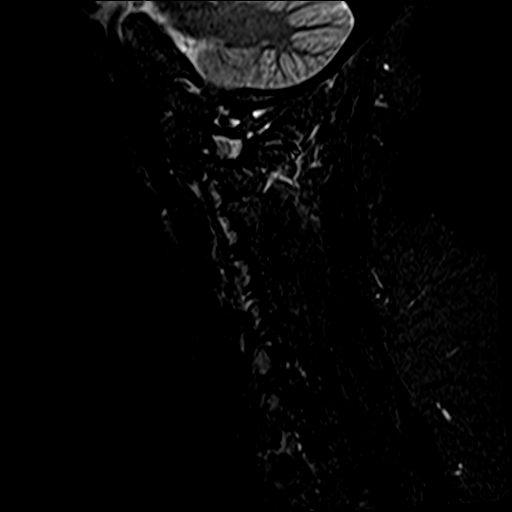
[im 13/13]
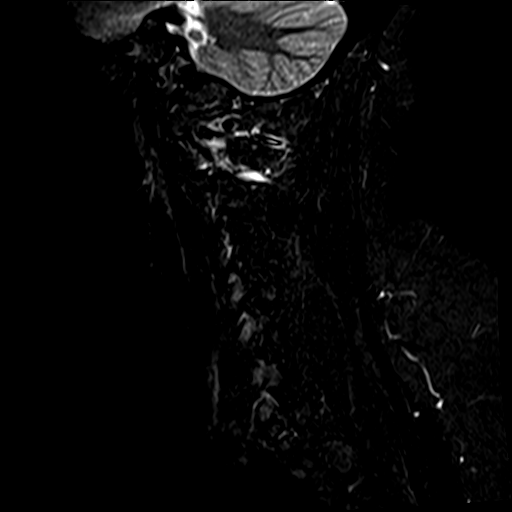

[Series 5: T2 · axial · 3.0mm · 0.62mm/px · z∈[-110,-18]mm · 8 of 26 slices shown (2 of 2)]
[im 1/26]
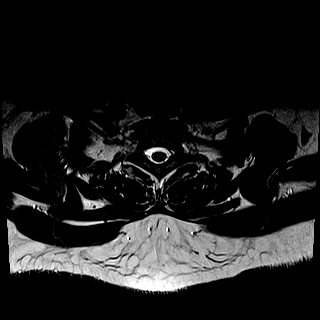
[im 4/26]
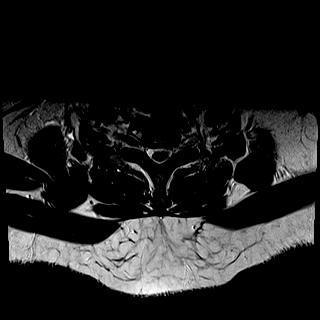
[im 8/26]
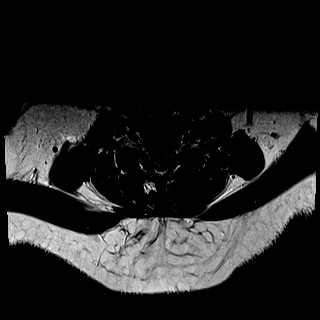
[im 12/26]
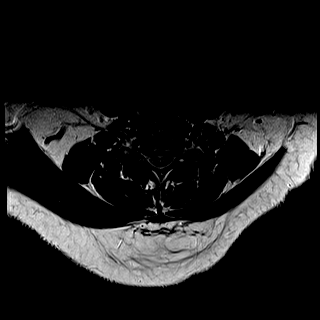
[im 14/26]
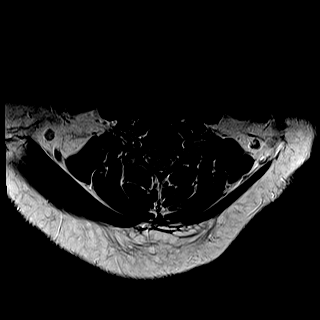
[im 18/26]
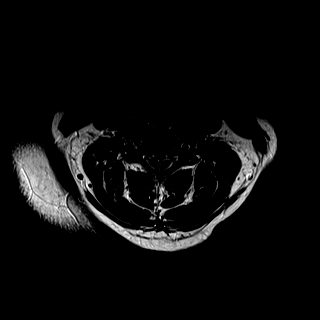
[im 22/26]
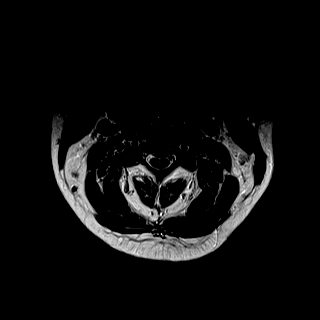
[im 26/26]
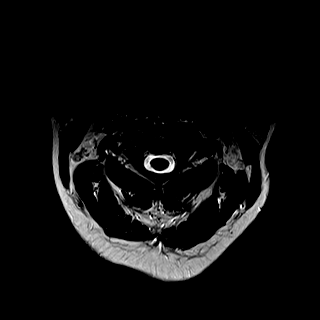

[Series 6: mpgr ax · axial · 3.0mm · 0.35mm/px · z∈[-99,-58]mm · 4 of 26 slices shown]
[im 1/26]
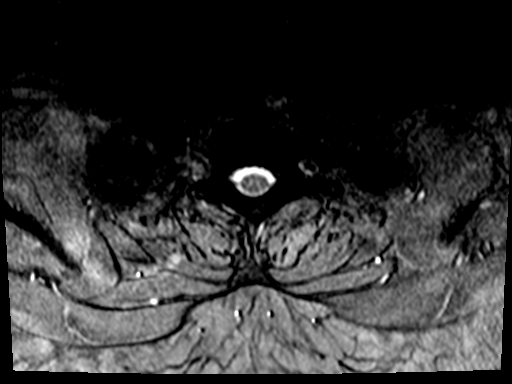
[im 4/26]
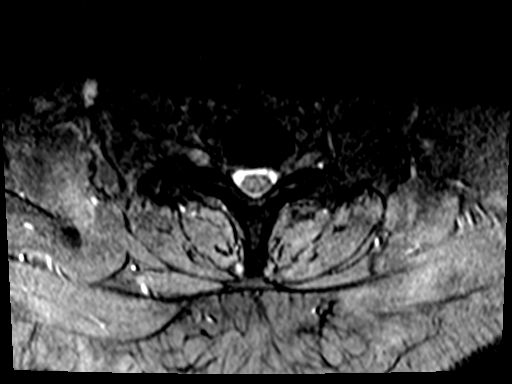
[im 8/26]
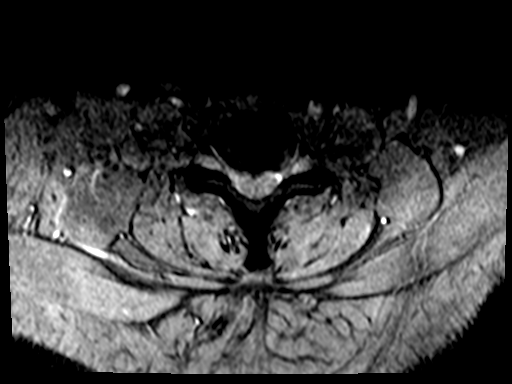
[im 12/26]
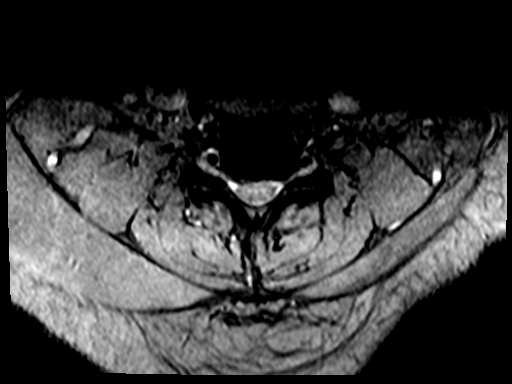

[32 of 48 positions shown; findings below may reference images not displayed]

FINDINGS: Alignment: Straightening of the normal cervical lordosis.

Vertebrae: No fracture or primary bone lesion.

Cord: No primary cord lesion. See below for cord deformity secondary
to degenerative changes.

Posterior Fossa, vertebral arteries, paraspinal tissues: Negative

Disc levels:

Foramen magnum, C1-2 and C2-3 are normal.

C3-4: Central disc herniation with slight upward migration behind
C3. Spinal stenosis with AP diameter of the canal in the midline
only 5.7 mm. Indentation/deformity of the cord. Foramina are mildly
stenotic.

C4-5: Broad-based, centrally predominant disc herniation. Effacement
of the subarachnoid space and indentation/deformity of the cord. AP
diameter of the canal in the midline as narrow as 6.3 mm. Foramina
sufficiently patent.

C5-6: Spondylosis with endplate osteophytes and bulging of the disc
more prominent towards the right. Canal narrowing with AP diameter
7.5 mm. Slight cord indentation. Foraminal encroachment, right worse
than left could affect the exiting C6 nerves.

C6-7: Broad-based, right posterolateral predominant disc herniation
with proximal foraminal extension on the right. AP diameter of the
canal in the midline as narrow as 6.3 mm. Cord
indentation/deformity, worse on the right. Right foraminal
encroachment virtually certain to affect the right C7 nerve.

C7-T1: Minimal disc bulge.  No stenosis.
IMPRESSION: Spinal stenosis from C3-4 through C6-7 due to osteophytes and disc
herniations with narrowing of the canal and deformity of the cord.
Although there is no abnormal T2 signal in the cord at this time,
these findings would place the patient at significant risk of
myelopathy and decompression should be considered. Foraminal
encroachment could affect the exiting nerve roots at C3-4
bilaterally, C5-6 right worse than left, and especially at C6-7 on
the right where there is a strongly right-sided predominant disc
herniation with foraminal extension. See above for full discussion.

## 2018-11-22 ENCOUNTER — Ambulatory Visit: Payer: 59 | Admitting: Pulmonary Disease

## 2018-11-28 ENCOUNTER — Encounter: Payer: Self-pay | Admitting: Cardiology

## 2018-11-28 ENCOUNTER — Ambulatory Visit (INDEPENDENT_AMBULATORY_CARE_PROVIDER_SITE_OTHER): Payer: 59 | Admitting: Cardiology

## 2018-11-28 VITALS — BP 122/78 | HR 99 | Ht 62.0 in | Wt 187.1 lb

## 2018-11-28 DIAGNOSIS — G4733 Obstructive sleep apnea (adult) (pediatric): Secondary | ICD-10-CM

## 2018-11-28 DIAGNOSIS — E6609 Other obesity due to excess calories: Secondary | ICD-10-CM | POA: Insufficient documentation

## 2018-11-28 DIAGNOSIS — Z7189 Other specified counseling: Secondary | ICD-10-CM | POA: Diagnosis not present

## 2018-11-28 DIAGNOSIS — R002 Palpitations: Secondary | ICD-10-CM

## 2018-11-28 DIAGNOSIS — Z6834 Body mass index (BMI) 34.0-34.9, adult: Secondary | ICD-10-CM

## 2018-11-28 NOTE — Patient Instructions (Signed)
Medication Instructions:  Your Physician recommend you continue on your current medication as directed.    If you need a refill on your cardiac medications before your next appointment, please call your pharmacy.   Lab work: None  Testing/Procedures: None  Follow-Up: Your physician recommends that you schedule a follow-up appointment in 1 year with Dr. Harrell Gave.   Any Other Special Instructions Will Be Listed Below (If Applicable).

## 2018-11-28 NOTE — Progress Notes (Signed)
Cardiology Office Note:    Date:  11/28/2018   ID:  Dillon Bjork, DOB 1971/02/10, MRN [id number]  PCP:  Mackie Pai, PA-C  Cardiologist:  Buford Dresser, MD PhD  Referring MD: Mackie Pai, PA-C   CC: Tachycardia  History of Present Illness:    Ann Frederick is a 47 y.o. female with a hx of hypertension, diabetes who is seen in follow up for the evaluation and management of tachycardia and palpitations. She was first seen on 08/31/18.   Cardiac history: first noted ~2018 that her heart rate would go to 150 bpm when exercising. Resting heart rate in the 80s usually, but was up to around 100 bpm resting early 2019. Had a cervical fusion summer 2019 and was less active around that time.   Today: Continues to work on weight loss. No big weight change with saxenda, though she does forget to take it intermittently. Has taken Belviq in the past, but off now given her use of wellbutrin. Resting heart rate in the 80s, low 100s with walking. Does feel rare brief palpitation about once a week. Just started using CPAP and feeling more rested. Blood pressure well controlled.   Past Medical History:  Diagnosis Date  . Acne   . Anemia    history of  . Anxiety   . Depression   . Diabetes mellitus    gest this pregnancy  . GERD (gastroesophageal reflux disease)    worse while pregnant  . Headache    Migraines  . Heart murmur    ? heart murmur in past  . History of blood transfusion 2013  . Hypertension     Past Surgical History:  Procedure Laterality Date  . CESAREAN SECTION     x 2  . CESAREAN SECTION  09/29/2012   Procedure: CESAREAN SECTION;  Surgeon: Luz Lex, MD;  Location: [facility];  Service: Obstetrics;  Laterality: N/A;  Repeat edc 10/12/12/REQUEST;Chassity,Dee,Colleen  . COLONOSCOPY    . ESOPHAGOGASTRODUODENOSCOPY  normal    7/12  . LAPAROSCOPIC VAGINAL HYSTERECTOMY WITH SALPINGECTOMY Bilateral 02/23/2016   Procedure: LAPAROSCOPIC ASSISTED VAGINAL HYSTERECTOMY  WITH bilateral SALPINGECTOMY, right oophorectomy. laporotic repair of incidental cystotomy.;  Surgeon: Louretta Shorten, MD;  Location: [facility];  Service: Gynecology;  Laterality: Bilateral;  . MOUTH SURGERY      Current Medications: Current Outpatient Medications on File Prior to Visit  Medication Sig  . aspirin 81 MG tablet Take 81 mg by mouth daily.  Marland Kitchen b complex vitamins capsule Take 1 capsule by mouth daily.  . B-D ULTRAFINE III SHORT PEN 31G X 8 MM MISC USE WITH SAENDA AS DIRECTED  . buPROPion (WELLBUTRIN XL) 150 MG 24 hr tablet Take 1 tablet (150 mg total) by mouth daily.  . cyclobenzaprine (FLEXERIL) 10 MG tablet Take 10 mg by mouth 3 (three) times daily as needed.  . hydrochlorothiazide (MICROZIDE) 12.5 MG capsule TAKE 1 CAPSULE BY MOUTH  DAILY  . valACYclovir (VALTREX) 1000 MG tablet 1 TABLET BY MOUTH TWICE A DAY AS NEEDED TAKE TWO AT FIRST SIGN THEN TAKE TWO 12 HOURS LATER  . zolpidem (AMBIEN) 10 MG tablet Take 10 mg by mouth at bedtime as needed for sleep.    No current facility-administered medications on file prior to visit.      Allergies:   Azithromycin   Social History   Socioeconomic History  . Marital status: Married    Spouse name: Not on file  . Number of children: 2  . Years of  education: Not on file  . Highest education level: Not on file  Occupational History  . Not on file  Social Needs  . Financial resource strain: Not on file  . Food insecurity:    Worry: Not on file    Inability: Not on file  . Transportation needs:    Medical: Not on file    Non-medical: Not on file  Tobacco Use  . Smoking status: Never Smoker  . Smokeless tobacco: Never Used  Substance and Sexual Activity  . Alcohol use: Yes    Comment: 1 glass of wine per week prior to preg  . Drug use: No  . Sexual activity: Yes  Lifestyle  . Physical activity:    Days per week: Not on file    Minutes per session: Not on file  . Stress: Not on file  Relationships  . Social connections:     Talks on phone: Not on file    Gets together: Not on file    Attends religious service: Not on file    Active member of club or organization: Not on file    Attends meetings of clubs or organizations: Not on file    Relationship status: Not on file  Other Topics Concern  . Not on file  Social History Narrative  . Not on file  Background in nursing, very high health literacy.   Family History: The patient's family history includes Cancer in her father; Depression in her mother; Diabetes in her mother; Hydrocephalus in her sister; Hypertension in her mother; Kidney disease in her father; Obesity in her mother; Thyroid disease in her mother. There is no history of Colon cancer. Mom has heart disease in her late 15s, had a heart cath and stent last year, has diabetes and hypertension. No early CAD.  ROS:   Please see the history of present illness.  Additional pertinent ROS: Review of Systems  Constitutional: Negative for chills, fever and weight loss.  HENT: Negative for ear pain and hearing loss.   Eyes: Negative for blurred vision and pain.  Respiratory: Negative for hemoptysis and shortness of breath.   Cardiovascular: Positive for palpitations. Negative for chest pain, orthopnea, claudication, leg swelling and PND.  Gastrointestinal: Negative for blood in stool and melena.  Genitourinary: Negative for dysuria and hematuria.  Musculoskeletal: Negative for falls and myalgias.  Skin: Negative for rash.  Neurological: Negative for dizziness, focal weakness and loss of consciousness.  Endo/Heme/Allergies: Does not bruise/bleed easily.   EKGs/Labs/Other Studies Reviewed:    The following studies were reviewed today: Prior notes, ECG (sinus tachycardia at 103 bpm)  EKG:  EKG is personally reviewed today.  The ekg ordered previously demonstrates NSR at 90 bpm  Recent Labs: 04/19/2018: Hemoglobin 14.0; Platelets 338 08/29/2018: ALT 15; BUN 7; Creatinine, Ser 0.88; Potassium 3.7; Sodium  139  Recent Lipid Panel    Component Value Date/Time   CHOL 171 08/15/2018 0829   TRIG 154.0 (H) 08/15/2018 0829   HDL 42.30 08/15/2018 0829   CHOLHDL 4 08/15/2018 0829   VLDL 30.8 08/15/2018 0829   LDLCALC 98 08/15/2018 0829   LDLDIRECT 101.0 11/10/2017 0840    Physical Exam:    VS:  BP 122/78   Pulse 99   Ht 5\' 2"  (1.575 m)   Wt 187 lb 1.3 oz (84.9 kg)   LMP 02/11/2016 (Exact Date)   SpO2 97%   BMI 34.22 kg/m     Wt Readings from Last 3 Encounters:  11/28/18 187 lb 1.3  oz (84.9 kg)  09/14/18 181 lb (82.1 kg)  08/31/18 181 lb (82.1 kg)     GEN: Well nourished, well developed in no acute distress HEENT: Normal NECK: No JVD; No carotid bruits LYMPHATICS: No lymphadenopathy CARDIAC: regular rhythm, normal S1 and S2, no murmurs, rubs, gallops. Radial and DP pulses 2+ bilaterally. RESPIRATORY:  Clear to auscultation without rales, wheezing or rhonchi  ABDOMEN: Soft, non-tender, non-distended MUSCULOSKELETAL:  No edema; No deformity  SKIN: Warm and dry NEUROLOGIC:  Alert and oriented x 3 PSYCHIATRIC:  Normal affect   ASSESSMENT:    1. Heart palpitations   2. OSA (obstructive sleep apnea)   3. Counseling on health promotion and disease prevention   4. Class 1 obesity due to excess calories without serious comorbidity with body mass index (BMI) of 34.0 to 34.9 in adult    PLAN:    1. Palpitations, sinus tachycardia: stable to improving -see prior recommendations re: increasing activity level. Ok if HR increases to 150 with vigorous exercise or low 100s with mild exercise, as it has been. Increasing conditioning over time will likely improve heart rate.  -reviewed weight loss medications. Belviq not ideal on wellbutrin, Qsymia may increase her heart rate with phentermine. Encouraged diet changes and exercise, she will discuss plans for saxenda with PCP. -will hold off on a monitor for now. If the sensation of palpitations/extra beats worsens, would get a 14 day  Zio.  2. Hypertension: at goal today.  -if weight loss and CPAP improve her pressure, could consider trialing off HCTZ  3. Prevention: counseled on risk factors, exercise, lifestyle. Her ASCVD risk is very low, already on aspirin 81 mg per her choice. No indication for preventative medication at this time. If her risk score increases, or if she develops diabetes in the future, would recommend a statin over baby aspirin for prevention. -recommend heart healthy/Mediterranean diet, with whole grains, fruits, vegetable, fish, lean meats, nuts, and olive oil. Limit salt. -recommend moderate walking, 3-5 times/week for 30-50 minutes each session. Aim for at least 150 minutes.week. Goal should be pace of 3 miles/hours, or walking 1.5 miles in 30 minutes -recommend avoidance of tobacco products. Avoid excess alcohol. -Additional risk factor control:  -Diabetes: A1c is 5.2, saxenda is for weight loss and not diabetes  -Lipids: LDL 98, no longer taking atorvastatin.  -Blood pressure control: well controlled, as above  -Weight: working on weight loss  -OSA: using CPAP -ASCVD risk score: The 10-year ASCVD risk score Mikey Bussing DC Brooke Bonito., et al., 2013) is: 1.3%   Values used to calculate the score:     Age: 36 years     Sex: Female     Is Non-Hispanic African American: No     Diabetic: No     Tobacco smoker: No     Systolic Blood Pressure: 938 mmHg     Is BP treated: Yes     HDL Cholesterol: 42.3 mg/dL     Total Cholesterol: 171 mg/dL  Plan for follow up: 1 year or sooner PRN  TIME SPENT WITH PATIENT: >25 minutes of direct patient care. More than 50% of that time was spent on coordination of care and counseling regarding tachycardia, exercise and weight loss recommendations.  Buford Dresser, MD, PhD [place]  [facility]   Medication Adjustments/Labs and Tests Ordered: Current medicines are reviewed at length with the patient today.  Concerns regarding medicines are outlined above.  No  orders of the defined types were placed in this encounter.  No  orders of the defined types were placed in this encounter.   Patient Instructions  Medication Instructions:  Your Physician recommend you continue on your current medication as directed.    If you need a refill on your cardiac medications before your next appointment, please call your pharmacy.   Lab work: None  Testing/Procedures: None  Follow-Up: Your physician recommends that you schedule a follow-up appointment in 1 year with Dr. Harrell Gave.   Any Other Special Instructions Will Be Listed Below (If Applicable).       Signed, Buford Dresser, MD PhD 11/28/2018 10:53 AM    [place]

## 2018-12-02 ENCOUNTER — Telehealth: Payer: 59 | Admitting: Family

## 2018-12-02 DIAGNOSIS — N390 Urinary tract infection, site not specified: Secondary | ICD-10-CM

## 2018-12-02 MED ORDER — CEPHALEXIN 500 MG PO CAPS: 500.0000 mg | ORAL_CAPSULE | Freq: Two times a day (BID) | ORAL | 0 refills | Status: DC

## 2018-12-02 NOTE — Progress Notes (Signed)

## 2018-12-11 ENCOUNTER — Ambulatory Visit (INDEPENDENT_AMBULATORY_CARE_PROVIDER_SITE_OTHER): Payer: 59 | Admitting: Pulmonary Disease

## 2018-12-11 ENCOUNTER — Encounter: Payer: Self-pay | Admitting: Pulmonary Disease

## 2018-12-11 DIAGNOSIS — G4733 Obstructive sleep apnea (adult) (pediatric): Secondary | ICD-10-CM

## 2018-12-11 DIAGNOSIS — Z6834 Body mass index (BMI) 34.0-34.9, adult: Secondary | ICD-10-CM | POA: Diagnosis not present

## 2018-12-11 DIAGNOSIS — E6609 Other obesity due to excess calories: Secondary | ICD-10-CM | POA: Diagnosis not present

## 2018-12-11 NOTE — Progress Notes (Signed)
   Subjective:    Patient ID: Dillon Bjork, female    DOB: 19-Mar-1971, 47 y.o.   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  She presented with excessive tiredness and fatigue.  We reviewed sleep study in detail which showed moderate OSA with AHI 18/hour regardless of body position.  Events seem to be clustered around 3.'s during the study suggestive of REM related OSA with lowest desaturation of 66% She was started on auto CPAP 5 to 12 cm with nasal pillows.  She was able to tolerate this within the first week and felt improved with more energy and feeling more alert and less tired during the day.  Machine was quiet and husband liked it, snoring was abolished.  However subsequent nights however she was taking the pillows off in the middle of the night. No problems with pressure or dryness. CPAP download was reviewed which shows good control of events on average 12 cm, with no leak but compliance has dropped after the first week, averages 4.5 hours on days used  We also discussed taking care of CPAP machine and maintenance   Significant tests/ events reviewed  HST 09/2018 >> moderate OSA with 18 events per hour, nadir 66 %  Review of Systems Patient denies significant dyspnea,cough, hemoptysis,  chest pain, palpitations, pedal edema, orthopnea, paroxysmal nocturnal dyspnea, lightheadedness, nausea, vomiting, abdominal or  leg pains      Objective:   Physical Exam  Gen. Pleasant, well-nourished, in no distress ENT - no pallor,icterus, no post nasal drip , no overbite Neck: No JVD, no thyromegaly, no carotid bruits Lungs: no use of accessory muscles, no dullness to percussion, clear without rales or rhonchi  Cardiovascular: Rhythm regular, heart sounds  normal, no murmurs or gallops, no peripheral edema Musculoskeletal: No deformities, no cyanosis or clubbing        Assessment & Plan:

## 2018-12-11 NOTE — Patient Instructions (Signed)
CPAP is set at 12 cm and is working well. Compliance of at least 4 hours every night is expected

## 2018-12-11 NOTE — Assessment & Plan Note (Signed)
Relationship between OSA and weight discussed and encouraged to lose weight

## 2018-12-11 NOTE — Assessment & Plan Note (Signed)
CPAP is set at 12 cm and is working well.  Weight loss encouraged, compliance with goal of at least 4-6 hrs every night is the expectation. Advised against medications with sedative side effects Cautioned against driving when sleepy - understanding that sleepiness will vary on a day to day basis

## 2018-12-15 DIAGNOSIS — G4733 Obstructive sleep apnea (adult) (pediatric): Secondary | ICD-10-CM | POA: Diagnosis not present

## 2018-12-18 ENCOUNTER — Other Ambulatory Visit: Payer: Self-pay | Admitting: Medical

## 2019-01-08 ENCOUNTER — Encounter: Payer: Managed Care, Other (non HMO) | Admitting: Medical

## 2019-01-09 ENCOUNTER — Encounter: Payer: Managed Care, Other (non HMO) | Admitting: Medical

## 2019-01-10 ENCOUNTER — Encounter: Payer: Self-pay | Admitting: Medical

## 2019-01-10 ENCOUNTER — Ambulatory Visit (INDEPENDENT_AMBULATORY_CARE_PROVIDER_SITE_OTHER): Payer: Managed Care, Other (non HMO) | Admitting: Medical

## 2019-01-10 VITALS — BP 130/88 | HR 77 | Temp 97.9°F | Resp 16 | Ht 62.0 in | Wt 186.8 lb

## 2019-01-10 DIAGNOSIS — R5383 Other fatigue: Secondary | ICD-10-CM

## 2019-01-10 DIAGNOSIS — Z0000 Encounter for general adult medical examination without abnormal findings: Secondary | ICD-10-CM | POA: Diagnosis not present

## 2019-01-10 DIAGNOSIS — R300 Dysuria: Secondary | ICD-10-CM

## 2019-01-10 LAB — POC URINALSYSI DIPSTICK (AUTOMATED)
Bilirubin, UA: NEGATIVE
Blood, UA: NEGATIVE
Glucose, UA: NEGATIVE
KETONES UA: NEGATIVE
LEUKOCYTES UA: NEGATIVE
NITRITE UA: NEGATIVE
PH UA: 6.5 (ref 5.0–8.0)
PROTEIN UA: NEGATIVE
Spec Grav, UA: 1.02 (ref 1.010–1.025)
UROBILINOGEN UA: NEGATIVE U/dL — AB

## 2019-01-10 MED ORDER — METFORMIN HCL 500 MG PO TABS: 500.0000 mg | ORAL_TABLET | Freq: Two times a day (BID) | ORAL | 0 refills | Status: DC

## 2019-01-10 NOTE — Progress Notes (Signed)
Subjective:    Patient ID: Ann Frederick, female    DOB: 1971/10/28, 48 y.o.   MRN: [id number]  HPI  Pt in for wellness exam.  Pt not fasting. Pt has been  exercising. Moderate healthy diet.   Pt needs biometrics data filled. But did not bring forms in.  She also mentions need refill of saxenda.Marland Kitchen BMI is 34. Pt tried phentermine but heart rate is elevated.  Pt tried wellbutrin but it did not help. Pt did not try metformin in past. Pt thinks she may have tried belviq. Pt willing just to try metformin in place of saxenda.  Pt will get papsmear and mammo in march, 2020.   Review of Systems  Constitutional: Negative for chills, fatigue and fever.  Respiratory: Negative for cough, chest tightness, shortness of breath and wheezing.   Cardiovascular: Negative for chest pain and palpitations.  Gastrointestinal: Negative for abdominal distention and abdominal pain.  Genitourinary:       Hx of frequency, burning and pain intermittently.   Musculoskeletal: Negative for back pain, gait problem and joint swelling.  Skin: Negative for rash.  Neurological: Negative for dizziness, speech difficulty, weakness, light-headedness and headaches.  Hematological: Negative for adenopathy. Does not bruise/bleed easily.  Psychiatric/Behavioral: Negative for behavioral problems, confusion, self-injury and sleep disturbance. The patient is not nervous/anxious.     Past Medical History:  Diagnosis Date  . Acne   . Anemia    history of  . Anxiety   . Depression   . Diabetes mellitus    gest this pregnancy  . GERD (gastroesophageal reflux disease)    worse while pregnant  . Headache    Migraines  . Heart murmur    ? heart murmur in past  . History of blood transfusion 2013  . Hypertension      Social History   Socioeconomic History  . Marital status: Married    Spouse name: Not on file  . Number of children: 2  . Years of education: Not on file  . Highest education level: Not on file    Occupational History  . Not on file  Social Needs  . Financial resource strain: Not on file  . Food insecurity:    Worry: Not on file    Inability: Not on file  . Transportation needs:    Medical: Not on file    Non-medical: Not on file  Tobacco Use  . Smoking status: Never Smoker  . Smokeless tobacco: Never Used  Substance and Sexual Activity  . Alcohol use: Yes    Comment: 1 glass of wine per week prior to preg  . Drug use: No  . Sexual activity: Yes  Lifestyle  . Physical activity:    Days per week: Not on file    Minutes per session: Not on file  . Stress: Not on file  Relationships  . Social connections:    Talks on phone: Not on file    Gets together: Not on file    Attends religious service: Not on file    Active member of club or organization: Not on file    Attends meetings of clubs or organizations: Not on file    Relationship status: Not on file  . Intimate partner violence:    Fear of current or ex partner: Not on file    Emotionally abused: Not on file    Physically abused: Not on file    Forced sexual activity: Not on file  Other Topics Concern  .  Not on file  Social History Narrative  . Not on file    Past Surgical History:  Procedure Laterality Date  . CESAREAN SECTION     x 2  . CESAREAN SECTION  09/29/2012   Procedure: CESAREAN SECTION;  Surgeon: Luz Lex, MD;  Location: [facility];  Service: Obstetrics;  Laterality: N/A;  Repeat edc 10/12/12/REQUEST;Chassity,Dee,Colleen  . COLONOSCOPY    . ESOPHAGOGASTRODUODENOSCOPY  normal    7/12  . LAPAROSCOPIC VAGINAL HYSTERECTOMY WITH SALPINGECTOMY Bilateral 02/23/2016   Procedure: LAPAROSCOPIC ASSISTED VAGINAL HYSTERECTOMY WITH bilateral SALPINGECTOMY, right oophorectomy. laporotic repair of incidental cystotomy.;  Surgeon: Louretta Shorten, MD;  Location: [facility];  Service: Gynecology;  Laterality: Bilateral;  . MOUTH SURGERY      Family History  Problem Relation Age of Onset  . Cancer Father        ?  lung CA  . Kidney disease Father   . Depression Mother   . Hypertension Mother   . Diabetes Mother   . Obesity Mother   . Thyroid disease Mother   . Hydrocephalus Sister   . Colon cancer Neg Hx     Allergies  Allergen Reactions  . Azithromycin Nausea And Vomiting    diarrhea    Current Outpatient Medications on File Prior to Visit  Medication Sig Dispense Refill  . aspirin 81 MG tablet Take 81 mg by mouth daily.    Marland Kitchen b complex vitamins capsule Take 1 capsule by mouth daily.    . B-D ULTRAFINE III SHORT PEN 31G X 8 MM MISC USE WITH SAENDA AS DIRECTED 100 each 6  . buPROPion (WELLBUTRIN XL) 150 MG 24 hr tablet Take 1 tablet (150 mg total) by mouth daily. 14 tablet 0  . hydrochlorothiazide (MICROZIDE) 12.5 MG capsule TAKE 1 CAPSULE BY MOUTH  DAILY 90 capsule 0  . Liraglutide -Weight Management (SAXENDA) 18 MG/3ML SOPN Inject into the skin.    Marland Kitchen tiZANidine (ZANAFLEX) 4 MG capsule Take 4 mg by mouth 3 (three) times daily.    . valACYclovir (VALTREX) 1000 MG tablet 1 TABLET BY MOUTH TWICE A DAY AS NEEDED TAKE TWO AT FIRST SIGN THEN TAKE TWO 12 HOURS LATER  4  . zolpidem (AMBIEN) 10 MG tablet Take 10 mg by mouth at bedtime as needed for sleep.   5   No current facility-administered medications on file prior to visit.     BP 130/88   Pulse 77   Temp 97.9 F (36.6 C) (Oral)   Resp 16   Ht 5\' 2"  (1.575 m)   Wt 186 lb 12.8 oz (84.7 kg)   LMP 02/11/2016 (Exact Date)   SpO2 100%   BMI 34.17 kg/m       Objective:   Physical Exam  General Mental Status- Alert. General Appearance- Not in acute distress.   Skin General: Color- Normal Color. Moisture- Normal Moisture.  Neck Carotid Arteries- Normal color. Moisture- Normal Moisture. No carotid bruits. No JVD.  Chest and Lung Exam Auscultation: Breath Sounds:-Normal.  Cardiovascular Auscultation:Rythm- Regular. Murmurs & Other Heart Sounds:Auscultation of the heart reveals- No Murmurs.  Abdomen Inspection:-Inspeection  Normal. Palpation/Percussion:Note:No mass. Palpation and Percussion of the abdomen reveal- Non Tender, Non Distended + BS, no rebound or guarding.  Neurologic Cranial Nerve exam:- CN III-XII intact(No nystagmus), symmetric smile. Strength:- 5/5 equal and symmetric strength both upper and lower extremities.  Back-no cva tenderness         Assessment & Plan:  For you wellness exam today I have ordered  cbc, cmp, tsh, lipid panel and ua..  Vaccine up to date.  Recommend exercise and healthy diet.  We will let you know lab results as they come in.  Follow up date appointment will be determined after lab review.   For weigh loss decided to rx metformin. Low cal diet and exercise. Will see how it goes. If not working rx saxenda again.  Follow up date to be determined after lab review.   Mackie Pai, PA-C

## 2019-01-10 NOTE — Patient Instructions (Addendum)
For you wellness exam today I have ordered cbc, cmp, tsh, lipid panel and ua..  Vaccine up to date.  Recommend exercise and healthy diet.  We will let you know lab results as they come in.  Follow up date appointment will be determined after lab review.   For weigh loss decided to rx metformin. Low cal diet and exercise. Will see how it goes. If not working rx saxenda again.  Follow up date to be determined after lab review.   Preventive Care 40-64 Years, Female Preventive care refers to lifestyle choices and visits with your health care provider that can promote health and wellness. What does preventive care include?   A yearly physical exam. This is also called an annual well check.  Dental exams once or twice a year.  Routine eye exams. Ask your health care provider how often you should have your eyes checked.  Personal lifestyle choices, including: ? Daily care of your teeth and gums. ? Regular physical activity. ? Eating a healthy diet. ? Avoiding tobacco and drug use. ? Limiting alcohol use. ? Practicing safe sex. ? Taking low-dose aspirin daily starting at age 22. ? Taking vitamin and mineral supplements as recommended by your health care provider. What happens during an annual well check? The services and screenings done by your health care provider during your annual well check will depend on your age, overall health, lifestyle risk factors, and family history of disease. Counseling Your health care provider may ask you questions about your:  Alcohol use.  Tobacco use.  Drug use.  Emotional well-being.  Home and relationship well-being.  Sexual activity.  Eating habits.  Work and work Statistician.  Method of birth control.  Menstrual cycle.  Pregnancy history. Screening You may have the following tests or measurements:  Height, weight, and BMI.  Blood pressure.  Lipid and cholesterol levels. These may be checked every 5 years, or more  frequently if you are over 38 years old.  Skin check.  Lung cancer screening. You may have this screening every year starting at age 15 if you have a 30-pack-year history of smoking and currently smoke or have quit within the past 15 years.  Colorectal cancer screening. All adults should have this screening starting at age 42 and continuing until age 51. Your health care provider may recommend screening at age 77. You will have tests every 1-10 years, depending on your results and the type of screening test. People at increased risk should start screening at an earlier age. Screening tests may include: ? Guaiac-based fecal occult blood testing. ? Fecal immunochemical test (FIT). ? Stool DNA test. ? Virtual colonoscopy. ? Sigmoidoscopy. During this test, a flexible tube with a tiny camera (sigmoidoscope) is used to examine your rectum and lower colon. The sigmoidoscope is inserted through your anus into your rectum and lower colon. ? Colonoscopy. During this test, a long, thin, flexible tube with a tiny camera (colonoscope) is used to examine your entire colon and rectum.  Hepatitis C blood test.  Hepatitis B blood test.  Sexually transmitted disease (STD) testing.  Diabetes screening. This is done by checking your blood sugar (glucose) after you have not eaten for a while (fasting). You may have this done every 1-3 years.  Mammogram. This may be done every 1-2 years. Talk to your health care provider about when you should start having regular mammograms. This may depend on whether you have a family history of breast cancer.  BRCA-related cancer screening. This  may be done if you have a family history of breast, ovarian, tubal, or peritoneal cancers.  Pelvic exam and Pap test. This may be done every 3 years starting at age 4. Starting at age 42, this may be done every 5 years if you have a Pap test in combination with an HPV test.  Bone density scan. This is done to screen for  osteoporosis. You may have this scan if you are at high risk for osteoporosis. Discuss your test results, treatment options, and if necessary, the need for more tests with your health care provider. Vaccines Your health care provider may recommend certain vaccines, such as:  Influenza vaccine. This is recommended every year.  Tetanus, diphtheria, and acellular pertussis (Tdap, Td) vaccine. You may need a Td booster every 10 years.  Varicella vaccine. You may need this if you have not been vaccinated.  Zoster vaccine. You may need this after age 45.  Measles, mumps, and rubella (MMR) vaccine. You may need at least one dose of MMR if you were born in 1957 or later. You may also need a second dose.  Pneumococcal 13-valent conjugate (PCV13) vaccine. You may need this if you have certain conditions and were not previously vaccinated.  Pneumococcal polysaccharide (PPSV23) vaccine. You may need one or two doses if you smoke cigarettes or if you have certain conditions.  Meningococcal vaccine. You may need this if you have certain conditions.  Hepatitis A vaccine. You may need this if you have certain conditions or if you travel or work in places where you may be exposed to hepatitis A.  Hepatitis B vaccine. You may need this if you have certain conditions or if you travel or work in places where you may be exposed to hepatitis B.  Haemophilus influenzae type b (Hib) vaccine. You may need this if you have certain conditions. Talk to your health care provider about which screenings and vaccines you need and how often you need them. This information is not intended to replace advice given to you by your health care provider. Make sure you discuss any questions you have with your health care provider. Document Released: 01/09/2016 Document Revised: 02/02/2018 Document Reviewed: 10/14/2015 Elsevier Interactive Patient Education  2019 [employer].

## 2019-01-11 ENCOUNTER — Other Ambulatory Visit (INDEPENDENT_AMBULATORY_CARE_PROVIDER_SITE_OTHER): Payer: Managed Care, Other (non HMO)

## 2019-01-11 DIAGNOSIS — Z0000 Encounter for general adult medical examination without abnormal findings: Secondary | ICD-10-CM | POA: Diagnosis not present

## 2019-01-11 DIAGNOSIS — R5383 Other fatigue: Secondary | ICD-10-CM

## 2019-01-11 LAB — CBC WITH DIFFERENTIAL/PLATELET
Basophils Absolute: 0.1 10*3/uL (ref 0.0–0.1)
Basophils Relative: 0.9 % (ref 0.0–3.0)
EOS PCT: 2.8 % (ref 0.0–5.0)
Eosinophils Absolute: 0.3 10*3/uL (ref 0.0–0.7)
HEMATOCRIT: 39.4 % (ref 36.0–46.0)
Hemoglobin: 13.2 g/dL (ref 12.0–15.0)
Lymphocytes Relative: 26.9 % (ref 12.0–46.0)
Lymphs Abs: 3.1 10*3/uL (ref 0.7–4.0)
MCHC: 33.3 g/dL (ref 30.0–36.0)
MCV: 91.5 fl (ref 78.0–100.0)
Monocytes Absolute: 0.7 10*3/uL (ref 0.1–1.0)
Monocytes Relative: 6 % (ref 3.0–12.0)
Neutro Abs: 7.3 10*3/uL (ref 1.4–7.7)
Neutrophils Relative %: 63.4 % (ref 43.0–77.0)
Platelets: 336 10*3/uL (ref 150.0–400.0)
RBC: 4.31 Mil/uL (ref 3.87–5.11)
RDW: 13.7 % (ref 11.5–15.5)
WBC: 11.5 10*3/uL — ABNORMAL HIGH (ref 4.0–10.5)

## 2019-01-11 LAB — COMPREHENSIVE METABOLIC PANEL
ALT: 16 U/L (ref 0–35)
AST: 17 U/L (ref 0–37)
Albumin: 4.1 g/dL (ref 3.5–5.2)
Alkaline Phosphatase: 51 U/L (ref 39–117)
BUN: 9 mg/dL (ref 6–23)
CO2: 29 mEq/L (ref 19–32)
Calcium: 9.6 mg/dL (ref 8.4–10.5)
Chloride: 103 mEq/L (ref 96–112)
Creatinine, Ser: 0.93 mg/dL (ref 0.40–1.20)
GFR: 83.01 mL/min (ref >60.00)
Glucose, Bld: 86 mg/dL (ref 70–99)
Potassium: 4.1 mEq/L (ref 3.5–5.1)
Sodium: 139 mEq/L (ref 135–145)
Total Bilirubin: 0.6 mg/dL (ref 0.2–1.2)
Total Protein: 6.8 g/dL (ref 6.0–8.3)

## 2019-01-11 LAB — VITAMIN B12: Vitamin B-12: 700 pg/mL (ref 211–911)

## 2019-01-11 LAB — LIPID PANEL
Cholesterol: 190 mg/dL (ref 0–200)
HDL: 49.7 mg/dL (ref >39.00)
LDL Cholesterol: 115 mg/dL — ABNORMAL HIGH (ref 0–99)
NonHDL: 140.13
TRIGLYCERIDES: 125 mg/dL (ref 0.0–149.0)
Total CHOL/HDL Ratio: 4
VLDL: 25 mg/dL (ref 0.0–40.0)

## 2019-01-11 LAB — TSH: TSH: 2.15 u[IU]/mL (ref 0.35–4.50)

## 2019-01-12 ENCOUNTER — Encounter: Payer: Self-pay | Admitting: Medical

## 2019-01-12 ENCOUNTER — Telehealth: Payer: Self-pay | Admitting: Medical

## 2019-01-12 LAB — URINE CULTURE
MICRO NUMBER:: 59279
SPECIMEN QUALITY:: ADEQUATE

## 2019-01-12 MED ORDER — NITROFURANTOIN MONOHYD MACRO 100 MG PO CAPS: 100.0000 mg | ORAL_CAPSULE | Freq: Two times a day (BID) | ORAL | 0 refills | Status: DC

## 2019-01-12 NOTE — Telephone Encounter (Signed)
Rx macrobid sent to pharmacy.

## 2019-01-15 LAB — VITAMIN D 1,25 DIHYDROXY
Vitamin D 1, 25 (OH)2 Total: 28 pg/mL (ref 18–72)
Vitamin D2 1, 25 (OH)2: <8 pg/mL
Vitamin D3 1, 25 (OH)2: 28 pg/mL

## 2019-01-15 LAB — VITAMIN B1: Vitamin B1 (Thiamine): 19 nmol/L (ref 8–30)

## 2019-02-01 ENCOUNTER — Other Ambulatory Visit: Payer: Self-pay | Admitting: Medical

## 2019-03-04 ENCOUNTER — Other Ambulatory Visit: Payer: Self-pay | Admitting: Medical

## 2019-03-11 NOTE — Progress Notes (Deleted)
@Patient  ID: Ann Frederick, female    DOB: 1971/06/20, 48 y.o.   MRN: [id number]  No chief complaint on file.   Referring provider: Elise Benne  HPI:  48 year old female never smoker followed in our office for OSA   PMH:  Smoker/ Smoking History: Never Smoker Maintenance:   Pt of: Dr. Elsworth Soho   03/11/2019  - Visit   HPI  Tests:   HST 09/2018 >> moderate OSA with 18 events per hour, nadir 66 %  FENO:  No results found for: NITRICOXIDE  PFT: No flowsheet data found.  Imaging: No results found.    Specialty Problems      Pulmonary Problems   Viral pharyngitis   Sinusitis   Sinusitis, acute frontal   OSA (obstructive sleep apnea)      Allergies  Allergen Reactions  . Azithromycin Nausea And Vomiting    diarrhea    Immunization History  Administered Date(s) Administered  . H1N1 03/19/2009  . Hepatitis B 11/03/2007, 01/04/2008, 03/19/2009, 06/24/2010, 07/29/2010, 01/29/2011  . Influenza Split 10/01/2012  . Influenza Whole 10/06/2007, 09/19/2009  . Influenza,inj,Quad PF,6+ Mos 09/26/2014, 11/11/2016, 11/11/2017, 10/18/2018  . Meningococcal Polysaccharide 11/03/2007  . Td 10/06/2007  . Tdap 09/18/2012    Past Medical History:  Diagnosis Date  . Acne   . Anemia    history of  . Anxiety   . Depression   . Diabetes mellitus    gest this pregnancy  . GERD (gastroesophageal reflux disease)    worse while pregnant  . Headache    Migraines  . Heart murmur    ? heart murmur in past  . History of blood transfusion 2013  . Hypertension     Tobacco History: Social History   Tobacco Use  Smoking Status Never Smoker  Smokeless Tobacco Never Used   Counseling given: Not Answered   Outpatient Encounter Medications as of 03/12/2019  Medication Sig  . aspirin 81 MG tablet Take 81 mg by mouth daily.  Marland Kitchen b complex vitamins capsule Take 1 capsule by mouth daily.  . B-D ULTRAFINE III SHORT PEN 31G X 8 MM MISC USE WITH SAENDA AS DIRECTED  .  buPROPion (WELLBUTRIN XL) 150 MG 24 hr tablet Take 1 tablet (150 mg total) by mouth daily.  . hydrochlorothiazide (MICROZIDE) 12.5 MG capsule TAKE 1 CAPSULE BY MOUTH  DAILY  . Liraglutide -Weight Management (SAXENDA) 18 MG/3ML SOPN Inject into the skin.  . metFORMIN (GLUCOPHAGE) 500 MG tablet TAKE 1 TABLET (500 MG TOTAL) BY MOUTH 2 (TWO) TIMES DAILY WITH A MEAL.  . nitrofurantoin, macrocrystal-monohydrate, (MACROBID) 100 MG capsule Take 1 capsule (100 mg total) by mouth 2 (two) times daily.  Marland Kitchen tiZANidine (ZANAFLEX) 4 MG capsule Take 4 mg by mouth 3 (three) times daily.  . valACYclovir (VALTREX) 1000 MG tablet 1 TABLET BY MOUTH TWICE A DAY AS NEEDED TAKE TWO AT FIRST SIGN THEN TAKE TWO 12 HOURS LATER  . zolpidem (AMBIEN) 10 MG tablet Take 10 mg by mouth at bedtime as needed for sleep.    No facility-administered encounter medications on file as of 03/12/2019.      Review of Systems  Review of Systems   Physical Exam  LMP 02/11/2016 (Exact Date)   Wt Readings from Last 5 Encounters:  01/10/19 186 lb 12.8 oz (84.7 kg)  12/11/18 186 lb (84.4 kg)  11/28/18 187 lb 1.3 oz (84.9 kg)  09/14/18 181 lb (82.1 kg)  08/31/18 181 lb (82.1 kg)  Physical Exam    Lab Results:  CBC    Component Value Date/Time   WBC 11.5 (H) 01/11/2019 0742   RBC 4.31 01/11/2019 0742   HGB 13.2 01/11/2019 0742   HCT 39.4 01/11/2019 0742   PLT 336.0 01/11/2019 0742   MCV 91.5 01/11/2019 0742   MCH 31.8 04/19/2018 0948   MCHC 33.3 01/11/2019 0742   RDW 13.7 01/11/2019 0742   LYMPHSABS 3.1 01/11/2019 0742   MONOABS 0.7 01/11/2019 0742   EOSABS 0.3 01/11/2019 0742   BASOSABS 0.1 01/11/2019 0742    BMET    Component Value Date/Time   NA 139 01/11/2019 0742   K 4.1 01/11/2019 0742   CL 103 01/11/2019 0742   CO2 29 01/11/2019 0742   GLUCOSE 86 01/11/2019 0742   BUN 9 01/11/2019 0742   CREATININE 0.93 01/11/2019 0742   CALCIUM 9.6 01/11/2019 0742   GFRNONAA >60 04/19/2018 0948   GFRAA >60  04/19/2018 0948    BNP No results found for: BNP  ProBNP No results found for: PROBNP    Assessment & Plan:     No problem-specific Assessment & Plan notes found for this encounter.     Lauraine Rinne, NP 03/11/2019   This appointment was *** with over 50% of the time in direct face-to-face patient care, assessment, plan of care, and follow-up.

## 2019-03-12 ENCOUNTER — Ambulatory Visit: Payer: 59 | Admitting: Adult Health

## 2019-03-12 ENCOUNTER — Encounter: Payer: Self-pay | Admitting: Medical

## 2019-03-12 ENCOUNTER — Encounter: Payer: Self-pay | Admitting: Physician Assistant

## 2019-03-12 ENCOUNTER — Ambulatory Visit: Payer: Managed Care, Other (non HMO) | Admitting: Pulmonary Disease

## 2019-03-12 ENCOUNTER — Telehealth: Payer: Managed Care, Other (non HMO) | Admitting: Physician Assistant

## 2019-03-12 DIAGNOSIS — M549 Dorsalgia, unspecified: Secondary | ICD-10-CM

## 2019-03-12 DIAGNOSIS — N390 Urinary tract infection, site not specified: Secondary | ICD-10-CM

## 2019-03-12 NOTE — Progress Notes (Signed)
Based on what you shared with me, I feel your condition warrants further evaluation and I recommend that you be seen for a face to face office visit.   Ann Frederick,  Your current symptoms ( UTI symptoms with back pain) suggest that a face to face evaluation is indicated to make sure a kidney infection (not just a urine infection) is excluded. Also, review of your chart indicates you may have had another UTI in the past 4-5 months, this is another indicator that a culture of your urine is indicated.    NOTE: If you entered your credit card information for this eVisit, you will not be charged. You may see a "hold" on your card for the $35 but that hold will drop off and you will not have a charge processed.  If you are having a true medical emergency please call 911.  If you need an urgent face to face visit, [facility] has four urgent care centers for your convenience.  If you need care fast and have a high deductible or no insurance consider:   [email or web address] to reserve your spot online an avoid wait times  [facility] [address] [place], [place] [address] 8 am to 8 pm Monday-Friday 10 am to 4 pm Saturday-Sunday *Across the street from [employer]  [place], [address] 8 am to 5 pm Monday-Friday * In the [facility] on the [facility]   The following sites will take your insurance:  . [facility] [place] a Provider at this Location  [address] [place], [place] [address] . 10 am to 8 pm Monday-Friday . 12 pm to 8 pm Saturday-Sunday   . [facility] at [place] a Provider at this Location  [address] [place], [place], [place] [address] . 8 am to 8 pm Monday-Friday . 9 am to 6 pm Saturday . 11 am to 6 pm Sunday   . [facility] [place] Get Driving Directions  [address] [place], [place] [address] . 8 am to 8 pm Monday-Friday . 8 am to 4 pm Saturday-Sunday   Your e-visit answers were reviewed by a board certified advanced clinical practitioner to complete your personal care plan.  Thank you for using e-Visits.

## 2019-03-13 ENCOUNTER — Telehealth: Payer: Self-pay | Admitting: Medical

## 2019-03-13 DIAGNOSIS — R300 Dysuria: Secondary | ICD-10-CM

## 2019-03-13 NOTE — Telephone Encounter (Signed)
Future labs place ua poct and culture.

## 2019-03-14 ENCOUNTER — Ambulatory Visit: Payer: Managed Care, Other (non HMO)

## 2019-03-14 DIAGNOSIS — R300 Dysuria: Secondary | ICD-10-CM

## 2019-03-14 LAB — POC URINALSYSI DIPSTICK (AUTOMATED)
Bilirubin, UA: 1
Blood, UA: NEGATIVE
Glucose, UA: NEGATIVE
PROTEIN UA: POSITIVE — AB
Spec Grav, UA: >=1.03 — AB (ref 1.010–1.025)
pH, UA: 6 (ref 5.0–8.0)

## 2019-03-15 ENCOUNTER — Telehealth: Payer: Self-pay | Admitting: Medical

## 2019-03-15 MED ORDER — CIPROFLOXACIN HCL 250 MG PO TABS: 250.0000 mg | ORAL_TABLET | Freq: Two times a day (BID) | ORAL | 0 refills | Status: DC

## 2019-03-15 NOTE — Telephone Encounter (Signed)
Opened to review 

## 2019-03-16 LAB — URINE CULTURE
MICRO NUMBER:: 332660
SPECIMEN QUALITY:: ADEQUATE

## 2019-03-20 ENCOUNTER — Encounter: Payer: Self-pay | Admitting: Medical

## 2019-03-20 ENCOUNTER — Other Ambulatory Visit: Payer: Self-pay

## 2019-03-20 ENCOUNTER — Ambulatory Visit (INDEPENDENT_AMBULATORY_CARE_PROVIDER_SITE_OTHER): Payer: Managed Care, Other (non HMO) | Admitting: Medical

## 2019-03-20 VITALS — BP 129/92 | HR 96 | Temp 98.2°F | Resp 16 | Ht 62.0 in | Wt 177.0 lb

## 2019-03-20 DIAGNOSIS — M791 Myalgia, unspecified site: Secondary | ICD-10-CM

## 2019-03-20 DIAGNOSIS — R5383 Other fatigue: Secondary | ICD-10-CM

## 2019-03-20 MED ORDER — TIZANIDINE HCL 4 MG PO CAPS: ORAL_CAPSULE | ORAL | 0 refills | Status: DC

## 2019-03-20 MED ORDER — MELOXICAM 7.5 MG PO TABS: ORAL_TABLET | ORAL | 0 refills | Status: DC

## 2019-03-20 NOTE — Progress Notes (Signed)
Subjective:    Patient ID: Ann Frederick, female    DOB: [birth year], 48 y.o.   MRN: [id number]  HPI  Pt in for bilateral anterior thigh and pain toward medial aspect of anterior superior iliac crest region. Pain can be random. But progressively get worse during the day. Pain feel sharp/like fire. She states feels like muscle pain. Pain going on for 2-3 weeks. Pt not having any described weakness of her legs.  No restless legs. No cramping.  Pt has not done any excess exercise.   Pt has lost 10 lb with diet, exercise and metformin.   Pt has used ibuprofen but did not helped.   Pt does not use any statin.  Pt had not back pain.  Hx of uti. I gave her 3 days of cipro. No current Uti symptoms after the antibiotic.    Review of Systems  Constitutional: Negative for chills, fatigue and fever.  HENT: Negative for congestion, drooling and ear pain.   Respiratory: Negative for cough, chest tightness, shortness of breath and wheezing.   Cardiovascular: Negative for chest pain and palpitations.  Gastrointestinal: Negative for abdominal distention, anal bleeding and nausea.  Genitourinary: Negative for dysuria, flank pain, frequency, pelvic pain, urgency and vaginal pain.  Musculoskeletal: Positive for myalgias. Negative for arthralgias, back pain, joint swelling and neck stiffness.       Muscle weakness.  Skin: Negative for rash.  Neurological: Negative for dizziness, tremors, speech difficulty, weakness and headaches.  Hematological: Negative for adenopathy. Does not bruise/bleed easily.  Psychiatric/Behavioral: Negative for behavioral problems, confusion and sleep disturbance. The patient is not nervous/anxious.     Past Medical History:  Diagnosis Date  . Acne   . Anemia    history of  . Anxiety   . Depression   . Diabetes mellitus    gest this pregnancy  . GERD (gastroesophageal reflux disease)    worse while pregnant  . Headache    Migraines  . Heart murmur    ?  heart murmur in past  . History of blood transfusion 2013  . Hypertension      Social History   Socioeconomic History  . Marital status: Married    Spouse name: Not on file  . Number of children: 2  . Years of education: Not on file  . Highest education level: Not on file  Occupational History  . Not on file  Social Needs  . Financial resource strain: Not on file  . Food insecurity:    Worry: Not on file    Inability: Not on file  . Transportation needs:    Medical: Not on file    Non-medical: Not on file  Tobacco Use  . Smoking status: Never Smoker  . Smokeless tobacco: Never Used  Substance and Sexual Activity  . Alcohol use: Yes    Comment: 1 glass of wine per week prior to preg  . Drug use: No  . Sexual activity: Yes  Lifestyle  . Physical activity:    Days per week: Not on file    Minutes per session: Not on file  . Stress: Not on file  Relationships  . Social connections:    Talks on phone: Not on file    Gets together: Not on file    Attends religious service: Not on file    Active member of club or organization: Not on file    Attends meetings of clubs or organizations: Not on file    Relationship status:  Not on file  . Intimate partner violence:    Fear of current or ex partner: Not on file    Emotionally abused: Not on file    Physically abused: Not on file    Forced sexual activity: Not on file  Other Topics Concern  . Not on file  Social History Narrative  . Not on file    Past Surgical History:  Procedure Laterality Date  . CESAREAN SECTION     x 2  . CESAREAN SECTION  09/29/2012   Procedure: CESAREAN SECTION;  Surgeon: Luz Lex, MD;  Location: [facility];  Service: Obstetrics;  Laterality: N/A;  Repeat edc 10/12/12/REQUEST;Chassity,Dee,Colleen  . COLONOSCOPY    . ESOPHAGOGASTRODUODENOSCOPY  normal    7/12  . LAPAROSCOPIC VAGINAL HYSTERECTOMY WITH SALPINGECTOMY Bilateral 02/23/2016   Procedure: LAPAROSCOPIC ASSISTED VAGINAL HYSTERECTOMY  WITH bilateral SALPINGECTOMY, right oophorectomy. laporotic repair of incidental cystotomy.;  Surgeon: Louretta Shorten, MD;  Location: [facility];  Service: Gynecology;  Laterality: Bilateral;  . MOUTH SURGERY      Family History  Problem Relation Age of Onset  . Cancer Father        ? lung CA  . Kidney disease Father   . Depression Mother   . Hypertension Mother   . Diabetes Mother   . Obesity Mother   . Thyroid disease Mother   . Hydrocephalus Sister   . Colon cancer Neg Hx     Allergies  Allergen Reactions  . Azithromycin Nausea And Vomiting    diarrhea    Current Outpatient Medications on File Prior to Visit  Medication Sig Dispense Refill  . aspirin 81 MG tablet Take 81 mg by mouth daily.    Marland Kitchen b complex vitamins capsule Take 1 capsule by mouth daily.    . B-D ULTRAFINE III SHORT PEN 31G X 8 MM MISC USE WITH SAENDA AS DIRECTED 100 each 6  . buPROPion (WELLBUTRIN XL) 150 MG 24 hr tablet Take 1 tablet (150 mg total) by mouth daily. 14 tablet 0  . ciprofloxacin (CIPRO) 250 MG tablet Take 1 tablet (250 mg total) by mouth 2 (two) times daily. 6 tablet 0  . hydrochlorothiazide (MICROZIDE) 12.5 MG capsule TAKE 1 CAPSULE BY MOUTH  DAILY 90 capsule 0  . metFORMIN (GLUCOPHAGE) 500 MG tablet TAKE 1 TABLET (500 MG TOTAL) BY MOUTH 2 (TWO) TIMES DAILY WITH A MEAL. 60 tablet 0  . nitrofurantoin, macrocrystal-monohydrate, (MACROBID) 100 MG capsule Take 1 capsule (100 mg total) by mouth 2 (two) times daily. 14 capsule 0  . tiZANidine (ZANAFLEX) 4 MG capsule Take 4 mg by mouth 3 (three) times daily.    . valACYclovir (VALTREX) 1000 MG tablet 1 TABLET BY MOUTH TWICE A DAY AS NEEDED TAKE TWO AT FIRST SIGN THEN TAKE TWO 12 HOURS LATER  4  . zolpidem (AMBIEN) 10 MG tablet Take 10 mg by mouth at bedtime as needed for sleep.   5   No current facility-administered medications on file prior to visit.     BP (!) 129/92   Pulse 96   Temp 98.2 F (36.8 C) (Oral)   Resp 16   Ht 5\' 2"  (1.575 m)   Wt 177  lb (80.3 kg)   LMP 02/11/2016 (Exact Date)   SpO2 99%   BMI 32.37 kg/m       Objective:   Physical Exam  General- No acute distress. Pleasant patient. Neck- Full range of motion, no jvd Lungs- Clear, even and unlabored. Heart- regular rate  and rhythm. Neurologic- CNII- XII grossly intact.  Back- no lumbar pain on palpation.   Lower ext- 5/5 lower ext weakness. L5-S1 sensation intact.  On range of motion thigh tender/painful on range of motion.   Left anterior superior iliac spine area mild tender to palpation.   Back- no cva tenderness.       Assessment & Plan:  For your bilateral thigh myalgias with some pain in the left anterior superior iliac spine region, we will do labs today which will include CMP, sed rate, ana, C-reactive protein, lactate level and CPK.  Presently what you do make sure that you stay well-hydrated and I am prescribing meloxicam NSAID from muscle aches.  Also prescribing Zanaflex muscle relaxant to use at night.  Prior UTI seems to have improved with only on 3 days of Cipro.  Low count on the urine culture and show sensitivity to Cipro.  Is above pain persists despite treatment and negative blood work-up then would consider getting x-ray.  Follow-up date to be to be determined depending on how you respond clinically.

## 2019-03-20 NOTE — Patient Instructions (Signed)
For your bilateral thigh myalgias with some pain in the left anterior superior iliac spine region, we will do labs today which will include CMP, sed rate, ana, C-reactive protein, lactate level and CPK.  Presently what you do make sure that you stay well-hydrated and I am prescribing meloxicam NSAID from muscle aches.  Also prescribing Zanaflex muscle relaxant to use at night.  Prior UTI seems to have improved with only on 3 days of Cipro.  Low count on the urine culture and show sensitivity to Cipro.  Is above pain persists despite treatment and negative blood work-up then would consider getting x-ray.  Follow-up date to be to be determined depending on how you respond clinically.

## 2019-03-21 ENCOUNTER — Telehealth: Payer: Self-pay | Admitting: Medical

## 2019-03-21 ENCOUNTER — Other Ambulatory Visit (INDEPENDENT_AMBULATORY_CARE_PROVIDER_SITE_OTHER): Payer: Managed Care, Other (non HMO)

## 2019-03-21 DIAGNOSIS — M791 Myalgia, unspecified site: Secondary | ICD-10-CM | POA: Diagnosis not present

## 2019-03-21 DIAGNOSIS — M549 Dorsalgia, unspecified: Secondary | ICD-10-CM

## 2019-03-21 LAB — C-REACTIVE PROTEIN: CRP: 1.4 mg/dL (ref 0.5–20.0)

## 2019-03-21 LAB — COMPREHENSIVE METABOLIC PANEL
ALBUMIN: 4 g/dL (ref 3.5–5.2)
ALT: 12 U/L (ref 0–35)
AST: 12 U/L (ref 0–37)
Alkaline Phosphatase: 55 U/L (ref 39–117)
BUN: 10 mg/dL (ref 6–23)
CALCIUM: 9.1 mg/dL (ref 8.4–10.5)
CO2: 29 mEq/L (ref 19–32)
Chloride: 102 mEq/L (ref 96–112)
Creatinine, Ser: 0.84 mg/dL (ref 0.40–1.20)
GFR: 87.76 mL/min (ref >60.00)
Glucose, Bld: 100 mg/dL — ABNORMAL HIGH (ref 70–99)
Potassium: 4.1 mEq/L (ref 3.5–5.1)
Sodium: 139 mEq/L (ref 135–145)
Total Bilirubin: 0.5 mg/dL (ref 0.2–1.2)
Total Protein: 6.5 g/dL (ref 6.0–8.3)

## 2019-03-21 LAB — CK: CK TOTAL: 38 U/L (ref 7–177)

## 2019-03-21 LAB — SEDIMENTATION RATE: Sed Rate: 19 mm/hr (ref 0–20)

## 2019-03-21 NOTE — Addendum Note (Signed)
Addended by: Kelle Darting A on: 03/21/2019 07:45 AM   Modules accepted: Orders

## 2019-03-21 NOTE — Telephone Encounter (Signed)
Opened by accident

## 2019-03-22 LAB — ANA: Anti Nuclear Antibody (ANA): NEGATIVE

## 2019-03-23 LAB — LACTIC ACID, PLASMA: LACTIC ACID: 1.7 mmol/L (ref 0.4–1.8)

## 2019-03-27 ENCOUNTER — Encounter: Payer: Self-pay | Admitting: Medical

## 2019-03-29 ENCOUNTER — Telehealth: Payer: Managed Care, Other (non HMO) | Admitting: Family

## 2019-03-29 ENCOUNTER — Other Ambulatory Visit: Payer: Self-pay | Admitting: Medical

## 2019-03-29 DIAGNOSIS — R0600 Dyspnea, unspecified: Secondary | ICD-10-CM

## 2019-03-29 MED ORDER — PROMETHAZINE-DM 6.25-15 MG/5ML PO SYRP: 5.0000 mL | ORAL_SOLUTION | Freq: Four times a day (QID) | ORAL | 0 refills | Status: DC | PRN

## 2019-03-29 NOTE — Progress Notes (Signed)
E-Visit for Corona Virus Screening  Based on your current symptoms, it seems unlikely that your symptoms are related to the Coronavirus.   Coronavirus disease 2019 (COVID-19) is a respiratory illness that can spread from person to person. The virus that causes COVID-19 is a new virus that was first identified in the country of [place] but is now found in multiple other countries and has spread to the [place].  Symptoms associated with the virus are mild to severe fever, cough, and shortness of breath. There is currently no vaccine to protect against COVID-19, and there is no specific antiviral treatment for the virus.   To be considered HIGH RISK for Coronavirus (COVID-19), you have to meet the following criteria:  . Traveled to [place], [place], [place], [place] or [place]; or in the [place] to [place], [place], [place], or [place]; and have fever, cough, and shortness of breath within the last 2 weeks of travel OR  . Been in close contact with a person diagnosed with COVID-19 within the last 2 weeks and have fever, cough, and shortness of breath  . IF YOU DO NOT MEET THESE CRITERIA, YOU ARE CONSIDERED LOW RISK FOR COVID-19.   It is vitally important that if you feel that you have an infection such as this virus or any other virus that you stay home and away from places where you may spread it to others.  You should self-quarantine for 14 days if you have symptoms that could potentially be coronavirus and avoid contact with people age 65 and older.   You can use medication such as A prescription cough medication called Phenergan DM 6.25 mg/15 mg. You make take one teaspoon / 5 ml every 4-6 hours as needed for cough  You may also take acetaminophen (Tylenol) as needed for fever.   Reduce your risk of any infection by using the same precautions used for avoiding the common cold or flu:  . Wash your hands often with soap and warm water for at least 20 seconds.  If soap and water are  not readily available, use an alcohol-based hand sanitizer with at least 60% alcohol.  . If coughing or sneezing, cover your mouth and nose by coughing or sneezing into the elbow areas of your shirt or coat, into a tissue or into your sleeve (not your hands). . Avoid shaking hands with others and consider head nods or verbal greetings only. . Avoid touching your eyes, nose, or mouth with unwashed hands.  . Avoid close contact with people who are sick. . Avoid places or events with large numbers of people in one location, like concerts or sporting events. . Carefully consider travel plans you have or are making. . If you are planning any travel outside or inside the [place], visit the CDC's Travelers' Health webpage for the latest health notices. . If you have some symptoms but not all symptoms, continue to monitor at home and seek medical attention if your symptoms worsen. . If you are having a medical emergency, call 911.  HOME CARE . Only take medications as instructed by your medical team. . Drink plenty of fluids and get plenty of rest. . A steam or ultrasonic humidifier can help if you have congestion.   GET HELP RIGHT AWAY IF: . You develop worsening fever. . You become short of breath . You cough up blood. . Your symptoms become more severe MAKE SURE YOU   Understand these instructions.  Will watch your condition.    Will get help right away if you are not doing well or get worse.  Your e-visit answers were reviewed by a board certified advanced clinical practitioner to complete your personal care plan.  Depending on the condition, your plan could have included both over the counter or prescription medications.  If there is a problem please reply once you have received a response from your provider. Your safety is important to us.  If you have drug allergies check your prescription carefully.    You can use MyChart to ask questions about today's visit, request a non-urgent call back,  or ask for a work or school excuse for 24 hours related to this e-Visit. If it has been greater than 24 hours you will need to follow up with your provider, or enter a new e-Visit to address those concerns. You will get an e-mail in the next two days asking about your experience.  I hope that your e-visit has been valuable and will speed your recovery. Thank you for using e-visits.    

## 2019-04-09 ENCOUNTER — Other Ambulatory Visit: Payer: Self-pay | Admitting: Medical

## 2019-04-10 NOTE — Telephone Encounter (Signed)
Rx meloxicam sent to pt pharmacy.

## 2019-05-03 ENCOUNTER — Other Ambulatory Visit: Payer: Self-pay | Admitting: Medical

## 2019-05-13 ENCOUNTER — Other Ambulatory Visit: Payer: Self-pay | Admitting: Medical

## 2019-05-15 ENCOUNTER — Other Ambulatory Visit: Payer: Self-pay | Admitting: Medical

## 2019-05-16 MED ORDER — TIZANIDINE HCL 4 MG PO CAPS: ORAL_CAPSULE | ORAL | 0 refills | Status: DC

## 2019-05-16 NOTE — Telephone Encounter (Signed)
Rx refill of zanaflex sent to pt pharmacy.

## 2019-05-21 ENCOUNTER — Encounter: Payer: Self-pay | Admitting: Physician Assistant

## 2019-05-21 ENCOUNTER — Telehealth: Payer: Managed Care, Other (non HMO) | Admitting: Physician Assistant

## 2019-05-21 DIAGNOSIS — M79659 Pain in unspecified thigh: Secondary | ICD-10-CM

## 2019-05-21 NOTE — Progress Notes (Signed)
Based on what you shared with me, I feel your condition warrants further evaluation and I recommend that you be seen for a face to face office visit.  Ms. Ann Frederick,  As per our conversation, you stated that your symptoms are not related to shingles, but you chose the symptoms as it was the closest to nerve pain. You stated you have nerve pain in your bilateral upper thighs, no rash. You are unsure what this is related to, as there is no surgery, injury, or other skin changes. It is recommended that you follow up with your doctor for further evaluation of your pain.    NOTE: If you entered your credit card information for this eVisit, you will not be charged. You may see a "hold" on your card for the $35 but that hold will drop off and you will not have a charge processed.  If you are having a true medical emergency please call 911.  If you need an urgent face to face visit, [facility] has four urgent care centers for your convenience.    PLEASE NOTE: THE [facility] LOCATIONS AND URGENT CARE CLINICS DO NOT HAVE THE TESTING FOR CORONAVIRUS COVID19 AVAILABLE.  IF YOU FEEL YOU NEED THIS TEST YOU MUST GO TO A TRIAGE LOCATION AT [facility]   [email or web address] to reserve your spot online an avoid wait times  [facility] [address], [address] [place], [place] [address] Modified hours of operation: Monday-Friday, 12 PM to 6 PM  Saturday & Sunday 10 AM to 4 PM *Across the street from [place] (New Address!) [address] [place], [place] [address] *Just off [address], across the road from [place] hours of operation: Monday-Friday, 12 PM to 6 PM  Closed Saturday & Sunday  [facility]'s modified hours of operation will be in effect from May 1 until May 31   The following sites will take your insurance:  . [facility] a  Provider at this Location  [address] [place], [place] [address] . 10 am to 8 pm Monday-Friday . 12 pm to 8 pm Saturday-Sunday   . [facility] [place] a Provider at this Location  [address], [place], [place] [address] . 8 am to 8 pm Monday-Friday . 9 am to 6 pm Saturday . 11 am to 6 pm Sunday   . [facility] at [place] Get Driving Directions  [address] [place], [place] [address] . 8 am to 8 pm Monday-Friday . 8 am to 4 pm Saturday-Sunday   Your e-visit answers were reviewed by a board certified advanced clinical practitioner to complete your personal care plan.  Thank you for using e-Visits. I have spent 7 min in completion and review of this note- [facility]

## 2019-06-03 ENCOUNTER — Other Ambulatory Visit: Payer: Self-pay | Admitting: Medical

## 2019-06-12 ENCOUNTER — Ambulatory Visit (INDEPENDENT_AMBULATORY_CARE_PROVIDER_SITE_OTHER): Payer: Managed Care, Other (non HMO) | Admitting: Medical

## 2019-06-12 ENCOUNTER — Encounter: Payer: Self-pay | Admitting: Medical

## 2019-06-12 ENCOUNTER — Other Ambulatory Visit: Payer: Self-pay

## 2019-06-12 VITALS — BP 159/87 | HR 97

## 2019-06-12 DIAGNOSIS — M5410 Radiculopathy, site unspecified: Secondary | ICD-10-CM | POA: Diagnosis not present

## 2019-06-12 DIAGNOSIS — M5441 Lumbago with sciatica, right side: Secondary | ICD-10-CM

## 2019-06-12 DIAGNOSIS — M5442 Lumbago with sciatica, left side: Secondary | ICD-10-CM

## 2019-06-12 MED ORDER — GABAPENTIN 100 MG PO CAPS: 100.0000 mg | ORAL_CAPSULE | Freq: Three times a day (TID) | ORAL | 0 refills | Status: DC

## 2019-06-12 NOTE — Progress Notes (Signed)
Subjective:    Patient ID: Ann Frederick, female    DOB: 07-17-1971, 48 y.o.   MRN: [id number]  HPI  Virtual Visit via Video Note  I connected with Ann Frederick on 06/12/19 at  9:00 AM EDT by a video enabled telemedicine application and verified that I am speaking with the correct person using two identifiers.  Location: Patient: home Provider: home   I discussed the limitations of evaluation and management by telemedicine and the availability of in person appointments. The patient expressed understanding and agreed to proceed.   History of Present Illness:  Pt reports today some pain in lower extremities. Pt states 2 months or more has top of her legs burning groin area down her leg. She pointed to both side medial upper thigh/ groin area. Pt states over past month she tried various treatment. She though muscular pain and she stopped jogging. Then tried yoga and made groin are more flexible but did not help sharp nerve pain. Meloxicam seemed to help with muscle pain. Pain wakes her up at night. If she wakes up at night will use zanaflex. No injury or trauma/fall. When stands for prolonged will cause pain in lower ext. Pt think maybe thigh muscle feel week. Every know and then has back pain. Some lumbar region intermittently.  Pt had evisit for this.  Describes pain as sharp at times.  Pt in past was on gabapentin when she had neck pain.Pt reluctant to use tramadol due to nausea.  Last night 132/86. This was at 11 pm.    Observations/Objective: General-no acute distress, pleasant, oriented. Lungs- on inspection lungs appear unlabored. Neck- no tracheal deviation or jvd on inspection. Neuro- gross motor function appears intact.   Assessment and Plan: You have some pain over the last couple months that seems to manage possible dermatomal pattern L1-L2.  Some occasional intermittent associated lower back pain but no direct correlation.  No skin rash noted.  On prior CT scan of  the abdomen L5-S1 disc bulge seen/some stenosis as well.  Would recommend that she use meloxicam on occasion if needed.  Will prescribe gabapentin for nerve pain.  Titrate up on dose as directed.  Side effect is often sedation.  You can use occasional Zanaflex if you have muscle spasms in the lower back.  We will get x-ray of lumbar spine today.  See if they make any comments about narrowed neural foramen or disc space narrowing.  If pain persists might consider referral to Dr. Emmaline Kluver medicine.  If cause of symptoms from lumbar spine then might need mri.  Follow up in one week or as needed  25 minutes spent with pt. 50% of time spent counseling and explaining on plan going forward.  Follow Up Instructions:    I discussed the assessment and treatment plan with the patient. The patient was provided an opportunity to ask questions and all were answered. The patient agreed with the plan and demonstrated an understanding of the instructions.   The patient was advised to call back or seek an in-person evaluation if the symptoms worsen or if the condition fails to improve as anticipated.  I provided 25 minutes of non-face-to-face time during this encounter.   Mackie Pai, PA-C    Review of Systems  Constitutional: Negative for chills, fatigue and fever.  Respiratory: Negative for chest tightness, shortness of breath and wheezing.   Cardiovascular: Negative for chest pain and palpitations.  Gastrointestinal: Negative for abdominal pain, nausea and vomiting.  Endocrine:  Negative for polydipsia, polyphagia and polyuria.  Genitourinary: Negative for difficulty urinating and dysuria.  Musculoskeletal: Positive for back pain.  Skin: Negative for rash.  Neurological: Negative for dizziness, syncope, speech difficulty, weakness and headaches.       Some described probalbe radicular pain. l1-l2 distribution.  Hematological: Negative for adenopathy.   Past Medical History:   Diagnosis Date  . Acne   . Anemia    history of  . Anxiety   . Depression   . Diabetes mellitus    gest this pregnancy  . GERD (gastroesophageal reflux disease)    worse while pregnant  . Headache    Migraines  . Heart murmur    ? heart murmur in past  . History of blood transfusion 2013  . Hypertension      Social History   Socioeconomic History  . Marital status: Married    Spouse name: Not on file  . Number of children: 2  . Years of education: Not on file  . Highest education level: Not on file  Occupational History  . Not on file  Social Needs  . Financial resource strain: Not on file  . Food insecurity    Worry: Not on file    Inability: Not on file  . Transportation needs    Medical: Not on file    Non-medical: Not on file  Tobacco Use  . Smoking status: Never Smoker  . Smokeless tobacco: Never Used  Substance and Sexual Activity  . Alcohol use: Yes    Comment: 1 glass of wine per week prior to preg  . Drug use: No  . Sexual activity: Yes  Lifestyle  . Physical activity    Days per week: Not on file    Minutes per session: Not on file  . Stress: Not on file  Relationships  . Social Herbalist on phone: Not on file    Gets together: Not on file    Attends religious service: Not on file    Active member of club or organization: Not on file    Attends meetings of clubs or organizations: Not on file    Relationship status: Not on file  . Intimate partner violence    Fear of current or ex partner: Not on file    Emotionally abused: Not on file    Physically abused: Not on file    Forced sexual activity: Not on file  Other Topics Concern  . Not on file  Social History Narrative  . Not on file    Past Surgical History:  Procedure Laterality Date  . CESAREAN SECTION     x 2  . CESAREAN SECTION  09/29/2012   Procedure: CESAREAN SECTION;  Surgeon: Luz Lex, MD;  Location: [facility];  Service: Obstetrics;  Laterality: N/A;  Repeat edc  10/12/12/REQUEST;Chassity,Dee,Colleen  . COLONOSCOPY    . ESOPHAGOGASTRODUODENOSCOPY  normal    7/12  . LAPAROSCOPIC VAGINAL HYSTERECTOMY WITH SALPINGECTOMY Bilateral 02/23/2016   Procedure: LAPAROSCOPIC ASSISTED VAGINAL HYSTERECTOMY WITH bilateral SALPINGECTOMY, right oophorectomy. laporotic repair of incidental cystotomy.;  Surgeon: Louretta Shorten, MD;  Location: [facility];  Service: Gynecology;  Laterality: Bilateral;  . MOUTH SURGERY      Family History  Problem Relation Age of Onset  . Cancer Father        ? lung CA  . Kidney disease Father   . Depression Mother   . Hypertension Mother   . Diabetes Mother   . Obesity Mother   .  Thyroid disease Mother   . Hydrocephalus Sister   . Colon cancer Neg Hx     Allergies  Allergen Reactions  . Azithromycin Nausea And Vomiting    diarrhea    Current Outpatient Medications on File Prior to Visit  Medication Sig Dispense Refill  . aspirin 81 MG tablet Take 81 mg by mouth daily.    Marland Kitchen b complex vitamins capsule Take 1 capsule by mouth daily.    . B-D ULTRAFINE III SHORT PEN 31G X 8 MM MISC USE WITH SAENDA AS DIRECTED 100 each 6  . buPROPion (WELLBUTRIN XL) 150 MG 24 hr tablet Take 1 tablet (150 mg total) by mouth daily. 14 tablet 0  . hydrochlorothiazide (MICROZIDE) 12.5 MG capsule TAKE 1 CAPSULE BY MOUTH  DAILY 90 capsule 0  . meloxicam (MOBIC) 7.5 MG tablet TAKE 1 TO 2 TABLETS EVERY DAY 30 tablet 0  . metFORMIN (GLUCOPHAGE) 500 MG tablet TAKE 1 TABLET (500 MG TOTAL) BY MOUTH 2 (TWO) TIMES DAILY WITH A MEAL. 60 tablet 0  . nitrofurantoin, macrocrystal-monohydrate, (MACROBID) 100 MG capsule Take 1 capsule (100 mg total) by mouth 2 (two) times daily. 14 capsule 0  . promethazine-dextromethorphan (PROMETHAZINE-DM) 6.25-15 MG/5ML syrup Take 5 mLs by mouth 4 (four) times daily as needed. 118 mL 0  . tiZANidine (ZANAFLEX) 4 MG capsule Take 4 mg by mouth 3 (three) times daily.    Marland Kitchen tiZANidine (ZANAFLEX) 4 MG capsule 1 tab po q hs 10 capsule 0  .  valACYclovir (VALTREX) 1000 MG tablet 1 TABLET BY MOUTH TWICE A DAY AS NEEDED TAKE TWO AT FIRST SIGN THEN TAKE TWO 12 HOURS LATER  4  . zolpidem (AMBIEN) 10 MG tablet Take 10 mg by mouth at bedtime as needed for sleep.   5   No current facility-administered medications on file prior to visit.     BP (!) 159/87   Pulse 97   LMP 02/11/2016 (Exact Date)       Objective:   Physical Exam        Assessment & Plan:

## 2019-06-12 NOTE — Patient Instructions (Addendum)
You have some pain over the last couple months that seems to manage possible dermatomal pattern L1-L2.  Some occasional intermittent associated lower back pain but no direct correlation.  No skin rash noted.  On prior CT scan of the abdomen L5-S1 disc bulge seen/some stenosis as well.  Would recommend that she use meloxicam on occasion if needed.  Will prescribe gabapentin for nerve pain.  Titrate up on dose as directed.  Side effect is often sedation.  You can use occasional Zanaflex if you have muscle spasms in the lower back.  We will get x-ray of lumbar spine today.  See if they make any comments about narrowed neural foramen or disc space narrowing.  If pain persists might consider referral to Dr. Emmaline Kluver medicine.  If cause of symptoms from lumbar spine then might need mri.  Elevated bp today but very good bp last night. Check bp daily and if bp not less than 140/90 let me know. See ros.  Follow up in one week or as needed

## 2019-06-13 ENCOUNTER — Ambulatory Visit (HOSPITAL_BASED_OUTPATIENT_CLINIC_OR_DEPARTMENT_OTHER)
Admission: RE | Admit: 2019-06-13 | Discharge: 2019-06-13 | Disposition: A | Discharge status: Home / Self Care | Payer: Managed Care, Other (non HMO) | Source: Ambulatory Visit | Attending: Medical | Admitting: Medical

## 2019-06-13 ENCOUNTER — Other Ambulatory Visit: Payer: Self-pay

## 2019-06-13 DIAGNOSIS — M5442 Lumbago with sciatica, left side: Secondary | ICD-10-CM | POA: Insufficient documentation

## 2019-06-13 DIAGNOSIS — M5441 Lumbago with sciatica, right side: Secondary | ICD-10-CM | POA: Diagnosis present

## 2019-06-18 ENCOUNTER — Encounter: Payer: Self-pay | Admitting: Medical

## 2019-06-18 ENCOUNTER — Other Ambulatory Visit: Payer: Self-pay | Admitting: Medical

## 2019-06-19 ENCOUNTER — Other Ambulatory Visit: Payer: Self-pay | Admitting: Medical

## 2019-06-19 MED ORDER — TIZANIDINE HCL 4 MG PO CAPS: ORAL_CAPSULE | ORAL | 0 refills | Status: DC

## 2019-06-19 NOTE — Telephone Encounter (Signed)
Pt calling to check status. Please advise  °

## 2019-06-20 ENCOUNTER — Encounter: Payer: Self-pay | Admitting: Family Medicine

## 2019-06-20 ENCOUNTER — Other Ambulatory Visit: Payer: Self-pay

## 2019-06-20 ENCOUNTER — Ambulatory Visit (INDEPENDENT_AMBULATORY_CARE_PROVIDER_SITE_OTHER): Payer: Managed Care, Other (non HMO) | Admitting: Family Medicine

## 2019-06-20 VITALS — BP 130/90 | HR 100 | Temp 98.2°F | Resp 16 | Ht 62.0 in | Wt 184.0 lb

## 2019-06-20 DIAGNOSIS — Z130 Encounter for screening for diseases of the blood and blood-forming organs and certain disorders involving the immune mechanism: Secondary | ICD-10-CM

## 2019-06-20 DIAGNOSIS — M5441 Lumbago with sciatica, right side: Secondary | ICD-10-CM | POA: Diagnosis not present

## 2019-06-20 DIAGNOSIS — E8881 Metabolic syndrome: Secondary | ICD-10-CM | POA: Diagnosis not present

## 2019-06-20 DIAGNOSIS — M5442 Lumbago with sciatica, left side: Secondary | ICD-10-CM

## 2019-06-20 DIAGNOSIS — M5136 Other intervertebral disc degeneration, lumbar region: Secondary | ICD-10-CM | POA: Diagnosis not present

## 2019-06-20 DIAGNOSIS — G8929 Other chronic pain: Secondary | ICD-10-CM

## 2019-06-20 DIAGNOSIS — D649 Anemia, unspecified: Secondary | ICD-10-CM

## 2019-06-20 LAB — COMPREHENSIVE METABOLIC PANEL
ALT: 21 U/L (ref 0–35)
AST: 23 U/L (ref 0–37)
Albumin: 4.1 g/dL (ref 3.5–5.2)
Alkaline Phosphatase: 49 U/L (ref 39–117)
BUN: 8 mg/dL (ref 6–23)
CO2: 26 mEq/L (ref 19–32)
Calcium: 9.2 mg/dL (ref 8.4–10.5)
Chloride: 103 mEq/L (ref 96–112)
Creatinine, Ser: 0.69 mg/dL (ref 0.40–1.20)
GFR: 110.01 mL/min (ref >60.00)
Glucose, Bld: 107 mg/dL — ABNORMAL HIGH (ref 70–99)
Potassium: 4.1 mEq/L (ref 3.5–5.1)
Sodium: 137 mEq/L (ref 135–145)
Total Bilirubin: 0.5 mg/dL (ref 0.2–1.2)
Total Protein: 6.5 g/dL (ref 6.0–8.3)

## 2019-06-20 LAB — CBC
HCT: 36.5 % (ref 36.0–46.0)
Hemoglobin: 11.9 g/dL — ABNORMAL LOW (ref 12.0–15.0)
MCHC: 32.6 g/dL (ref 30.0–36.0)
MCV: 90.3 fl (ref 78.0–100.0)
Platelets: 399 10*3/uL (ref 150.0–400.0)
RBC: 4.04 Mil/uL (ref 3.87–5.11)
RDW: 14 % (ref 11.5–15.5)
WBC: 10.3 10*3/uL (ref 4.0–10.5)

## 2019-06-20 LAB — LIPID PANEL
Cholesterol: 200 mg/dL (ref 0–200)
HDL: 46.7 mg/dL (ref >39.00)
LDL Cholesterol: 117 mg/dL — ABNORMAL HIGH (ref 0–99)
NonHDL: 153.69
Total CHOL/HDL Ratio: 4
Triglycerides: 181 mg/dL — ABNORMAL HIGH (ref 0.0–149.0)
VLDL: 36.2 mg/dL (ref 0.0–40.0)

## 2019-06-20 LAB — HEMOGLOBIN A1C: Hgb A1c MFr Bld: 5.4 % (ref 4.6–6.5)

## 2019-06-20 MED ORDER — CYCLOBENZAPRINE HCL 10 MG PO TABS: 10.0000 mg | ORAL_TABLET | Freq: Every day | ORAL | 0 refills | Status: DC

## 2019-06-20 MED ORDER — TRAMADOL HCL 50 MG PO TABS: 50.0000 mg | ORAL_TABLET | Freq: Three times a day (TID) | ORAL | 0 refills | Status: DC | PRN

## 2019-06-20 NOTE — Patient Instructions (Addendum)
It was good to see you today, but I am sorry that your back has been bothering you. We will work on getting an MRI of your lumbar spine.  Please let me know if you do not hear about this appointment within the next few days For the time being, you can increase your gabapentin to 2 pills or 200 mg 3 times a day.  If this seems to help and you want to try going higher, we can also increase to 300-3 times a day.  Please just let me know how this works for you  I sent in a prescription in for tramadol to use as needed for pain.  Remember this can cause you to feel sleepy I also prescribed Flexeril to take at bedtime in place of Zanaflex.  This medication will definitely cause sedation, do not drive after taking it  Please do not use Ambien with any of these medications

## 2019-06-20 NOTE — Addendum Note (Signed)
Addended by: Lamar Blinks C on: 06/20/2019 03:35 PM   Modules accepted: Orders

## 2019-06-20 NOTE — Progress Notes (Addendum)
[place] [facility] [address] [place], [place] [address] [phone]  Date:  06/20/2019   Name:  Ann Frederick   DOB:  1971/08/26   MRN:  [id number]  PCP:  Mackie Pai, PA-C    Chief Complaint: Back Pain (lower back pain, 2-3 months no injury, radiating to legs)   History of Present Illness:  Ann Frederick is a 48 y.o. very pleasant female patient who presents with the following:  Pt of [name] who I last saw in 2017.  Here today with concern of lower back pain  History of obesity, fatigue, metabolic syndrome. She saw [name] for same about a week ago via virtual visit.  He gave her some zanaflex and mobic to try She also went back on Gabapentin at 100 mg TID Per HPI at last visit, pt had concern of 2 months or more of legs burning into her groin Today she notes perhaps actually 3-4 months of lower back pain, seems to be centered around on her tailbone and sensitive to light or deep touch.  She also notes a burning sensation of the bilateral anterior thighs Worse at night- keeping her awake   If she has been sitting for a while she has a hard time standing up due to pain.  If she tries to rise immediately she might have a shooting pain in the groin.  "it is to the point that I scream out."  No difficulty with bowel or bladder surgery, no genital numbness  Activity does not make her worse   She tried magnesium PO and soaks, yoga She does not like to use narcotics as they cause nausea She did have neck surgery last year- she had a cervical fusion per Dr. Saintclair Halsted   She has had sciatica in the past but never had this type of back pain   S/p hysteretomy Lab Results  Component Value Date   HGBA1C 5.2 08/29/2018   She does not have diabetes but does take metformin for metabolic syndrome She notes that she really does not tolerate prednisone and prefers not to use it   Dg Lumbar Spine 2-3 Views  Result Date:  06/13/2019 CLINICAL DATA:  Severe low back pain.  Radicular pain. EXAM: LUMBAR SPINE - 2-3 VIEW COMPARISON:  None. FINDINGS: Multilevel degenerative disc disease with small anterior osteophytes. No fracture or traumatic malalignment. IMPRESSION: Multilevel degenerative disc disease with small anterior osteophytes. No fracture or malalignment. Electronically Signed   By: Dorise Bullion III M.D   On: 06/13/2019 08:40     Patient Active Problem List   Diagnosis Date Noted  . Class 1 obesity due to excess calories without serious comorbidity with body mass index (BMI) of 34.0 to 34.9 in adult 11/28/2018  . Heart palpitations 11/28/2018  . OSA (obstructive sleep apnea) 09/14/2018  . Neck pain 04/26/2018  . Wellness examination 03/08/2016  . S/P laparoscopic assisted vaginal hysterectomy (LAVH) 02/23/2016  . Pink eye 06/19/2015  . Pain of right thumb 06/19/2015  . Sinusitis, acute frontal 02/05/2015  . Headache around the eyes 02/05/2015  . Back pain 11/01/2014  . Routine general medical examination at a health care facility 10/30/2013  . Vesicular rash 09/07/2013  . Sinusitis 02/02/2013  . Viral pharyngitis 03/03/2012  . Flank pain 08/13/2011  . Inguinal adenopathy 08/13/2011  . Dysphagia 04/20/2011  . GERD 01/29/2011  . FATIGUE 01/29/2011  . 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 LEG PAIN, BILATERAL 06/06/2010  .  HIP PAIN, LEFT 04/01/2010  . MASS, LOCALIZED, SUPERFICIAL 03/05/2009  . ACNE VULGARIS 07/11/2007  . MURMUR 07/11/2007    Past Medical History:  Diagnosis Date  . Acne   . Anemia    history of  . Anxiety   . Depression   . Diabetes mellitus    gest this pregnancy  . GERD (gastroesophageal reflux disease)    worse while pregnant  . Headache    Migraines  . Heart murmur    ? heart murmur in past  . History of blood transfusion 2013  . Hypertension     Past Surgical History:  Procedure Laterality Date  . CESAREAN SECTION     x 2  . CESAREAN SECTION  09/29/2012    Procedure: CESAREAN SECTION;  Surgeon: Luz Lex, MD;  Location: [facility];  Service: Obstetrics;  Laterality: N/A;  Repeat edc 10/12/12/REQUEST;Chassity,Dee,Colleen  . COLONOSCOPY    . ESOPHAGOGASTRODUODENOSCOPY  normal    7/12  . LAPAROSCOPIC VAGINAL HYSTERECTOMY WITH SALPINGECTOMY Bilateral 02/23/2016   Procedure: LAPAROSCOPIC ASSISTED VAGINAL HYSTERECTOMY WITH bilateral SALPINGECTOMY, right oophorectomy. laporotic repair of incidental cystotomy.;  Surgeon: Louretta Shorten, MD;  Location: [facility];  Service: Gynecology;  Laterality: Bilateral;  . MOUTH SURGERY      Social History   Tobacco Use  . Smoking status: Never Smoker  . Smokeless tobacco: Never Used  Substance Use Topics  . Alcohol use: Yes    Comment: 1 glass of wine per week prior to preg  . Drug use: No    Family History  Problem Relation Age of Onset  . Cancer Father        ? lung CA  . Kidney disease Father   . Depression Mother   . Hypertension Mother   . Diabetes Mother   . Obesity Mother   . Thyroid disease Mother   . Hydrocephalus Sister   . Colon cancer Neg Hx     Allergies  Allergen Reactions  . Azithromycin Nausea And Vomiting    diarrhea    Medication list has been reviewed and updated.  Current Outpatient Medications on File Prior to Visit  Medication Sig Dispense Refill  . aspirin 81 MG tablet Take 81 mg by mouth daily.    Marland Kitchen b complex vitamins capsule Take 1 capsule by mouth daily.    Marland Kitchen gabapentin (NEURONTIN) 100 MG capsule Take 1 capsule (100 mg total) by mouth 3 (three) times daily. 90 capsule 0  . hydrochlorothiazide (MICROZIDE) 12.5 MG capsule TAKE 1 CAPSULE BY MOUTH  DAILY 90 capsule 0  . meloxicam (MOBIC) 7.5 MG tablet TAKE 1 TO 2 TABLETS EVERY DAY 30 tablet 0  . metFORMIN (GLUCOPHAGE) 500 MG tablet TAKE 1 TABLET (500 MG TOTAL) BY MOUTH 2 (TWO) TIMES DAILY WITH A MEAL. 60 tablet 0  . promethazine-dextromethorphan (PROMETHAZINE-DM) 6.25-15 MG/5ML syrup Take 5 mLs by mouth 4 (four) times daily  as needed. 118 mL 0  . tiZANidine (ZANAFLEX) 4 MG capsule Take 4 mg by mouth 3 (three) times daily.    Marland Kitchen tiZANidine (ZANAFLEX) 4 MG capsule 1 tab po q hs 10 capsule 0  . valACYclovir (VALTREX) 1000 MG tablet 1 TABLET BY MOUTH TWICE A DAY AS NEEDED TAKE TWO AT FIRST SIGN THEN TAKE TWO 12 HOURS LATER  4  . zolpidem (AMBIEN) 10 MG tablet Take 10 mg by mouth at bedtime as needed for sleep.   5   No current facility-administered medications on file prior to visit.  Review of Systems:  As per HPI- otherwise negative. No fever or chills   Physical Examination: Vitals:   06/20/19 0828  BP: 130/90  Pulse: 100  Resp: 16  Temp: 98.2 F (36.8 C)  SpO2: 97%   Vitals:   06/20/19 0828  Weight: 184 lb (83.5 kg)  Height: 5\' 2"  (1.575 m)   Body mass index is 33.65 kg/m. Ideal Body Weight: Weight in (lb) to have BMI = 25: 136.4  GEN: WDWN, NAD, Non-toxic, A & O x 3, obese, looks well  HEENT: Atraumatic, Normocephalic. Neck supple. No masses, No LAD. Ears and Nose: No external deformity. CV: RRR, No M/G/R. No JVD. No thrill. No extra heart sounds. PULM: CTA B, no wheezes, crackles, rhonchi. No retractions. No resp. distress. No accessory muscle use. ABD: S, NT, ND. No rebound. No HSM. EXTR: No c/c/e NEURO Normal gait.  PSYCH: Normally interactive. Conversant. Not depressed or anxious appearing.  Calm demeanor.  Patient has mild tenderness palpation over the lumbosacral junction.  Normal lumbar flexion, discomfort with extension and extension with twist.  Normal bilateral upper and lower extremity strength, sensation, DTR.  Discomfort with straight leg raise bilaterally.  No genital or groin numbness is noted.  Assessment and Plan:   ICD-10-CM   1. Chronic midline low back pain with bilateral sciatica  M54.41 traMADol (ULTRAM) 50 MG tablet   M54.42 cyclobenzaprine (FLEXERIL) 10 MG tablet   G89.29   2. Other intervertebral disc degeneration, lumbar region  M51.36 MR Lumbar Spine Wo  Contrast  3. Screening for deficiency anemia  Z13.0 CBC  4. Metabolic syndrome  N98.92 Comprehensive metabolic panel    Hemoglobin A1c    Lipid panel   Today with persistent lower back pain.  She did have recent lumbar films which showed multilevel degenerative disc disease.  Discussed treatment options with patient.  As her symptoms been present for more than 3 months, she would like to go ahead with an MRI which I think is quite reasonable.  Offered prednisone but she declines, she does not tolerate this medication well. If MRI is positive we can have her follow-up with Dr. Saintclair Halsted  For current symptoms we will have her increase gabapentin to 200 mg 3 times a day.  I also gave her tramadol to use as needed for more severe pain, and Flexeril which she can take at night as needed.  Cautioned her that these medications can cause drowsiness, do not combine with Ambien.  She will let me know if any significant change or worsening her symptoms, or any loss of bowel or bladder control  Follow-up: No follow-ups on file.  Meds ordered this encounter  Medications  . traMADol (ULTRAM) 50 MG tablet    Sig: Take 1 tablet (50 mg total) by mouth every 8 (eight) hours as needed for up to 5 days.    Dispense:  15 tablet    Refill:  0  . cyclobenzaprine (FLEXERIL) 10 MG tablet    Sig: Take 1 tablet (10 mg total) by mouth at bedtime. Use as needed for pain and muscle spasm    Dispense:  30 tablet    Refill:  0   Orders Placed This Encounter  Procedures  . MR Lumbar Spine Wo Contrast  . CBC  . Comprehensive metabolic panel  . Hemoglobin A1c  . Lipid panel      Signed Lamar Blinks, MD  Received her labs, message to pt  Results for orders placed or performed in visit on  06/20/19  CBC  Result Value Ref Range   WBC 10.3 4.0 - 10.5 K/uL   RBC 4.04 3.87 - 5.11 Mil/uL   Platelets 399.0 150.0 - 400.0 K/uL   Hemoglobin 11.9 (L) 12.0 - 15.0 g/dL   HCT 36.5 36.0 - 46.0 %   MCV 90.3 78.0 - 100.0  fl   MCHC 32.6 30.0 - 36.0 g/dL   RDW 14.0 11.5 - 15.5 %  Comprehensive metabolic panel  Result Value Ref Range   Sodium 137 135 - 145 mEq/L   Potassium 4.1 3.5 - 5.1 mEq/L   Chloride 103 96 - 112 mEq/L   CO2 26 19 - 32 mEq/L   Glucose, Bld 107 (H) 70 - 99 mg/dL   BUN 8 6 - 23 mg/dL   Creatinine, Ser 0.69 0.40 - 1.20 mg/dL   Total Bilirubin 0.5 0.2 - 1.2 mg/dL   Alkaline Phosphatase 49 39 - 117 U/L   AST 23 0 - 37 U/L   ALT 21 0 - 35 U/L   Total Protein 6.5 6.0 - 8.3 g/dL   Albumin 4.1 3.5 - 5.2 g/dL   Calcium 9.2 8.4 - 10.5 mg/dL   GFR 110.01 >60.00 mL/min  Hemoglobin A1c  Result Value Ref Range   Hgb A1c MFr Bld 5.4 4.6 - 6.5 %  Lipid panel  Result Value Ref Range   Cholesterol 200 0 - 200 mg/dL   Triglycerides 181.0 (H) 0.0 - 149.0 mg/dL   HDL 46.70 >39.00 mg/dL   VLDL 36.2 0.0 - 40.0 mg/dL   LDL Cholesterol 117 (H) 0 - 99 mg/dL   Total CHOL/HDL Ratio 4    NonHDL 153.69    Minimal anemia will ned recheck The 10-year ASCVD risk score Mikey Bussing DC Jr., et al., 2013) is: 1.7%   Values used to calculate the score:     Age: 40 years     Sex: Female     Is Non-Hispanic African American: No     Diabetic: No     Tobacco smoker: No     Systolic Blood Pressure: 425 mmHg     Is BP treated: Yes     HDL Cholesterol: 46.7 mg/dL     Total Cholesterol: 200 mg/dL

## 2019-06-26 ENCOUNTER — Other Ambulatory Visit: Payer: Self-pay | Admitting: Medical

## 2019-06-30 ENCOUNTER — Telehealth: Payer: Self-pay | Admitting: Medical

## 2019-06-30 NOTE — Telephone Encounter (Signed)
Pt has form from [place] stating that they need information to determine if she can get mri of lumbar spine. I will give you the form. Would you fax over the 06/12/2019, 06/18/2019, and 06/20/2019 note. Also will you fax the 06/13/2019 lumbar xray report.

## 2019-06-30 NOTE — Telephone Encounter (Signed)
Will you let pt know Ann Frederick is asking to get more info on her back before they give answer on if they will approve mri. So asking staff to send info in. If they deny then will try to refer to sports med as they could push through order easier/quicker.

## 2019-07-02 ENCOUNTER — Other Ambulatory Visit: Payer: Self-pay | Admitting: Medical

## 2019-07-03 MED ORDER — HYDROCHLOROTHIAZIDE 12.5 MG PO CAPS: 12.5000 mg | ORAL_CAPSULE | Freq: Every day | ORAL | 0 refills | Status: DC

## 2019-07-03 NOTE — Telephone Encounter (Signed)
Pt.notified

## 2019-07-04 ENCOUNTER — Other Ambulatory Visit: Payer: Self-pay | Admitting: Medical

## 2019-07-16 ENCOUNTER — Ambulatory Visit
Admission: RE | Admit: 2019-07-16 | Discharge: 2019-07-16 | Disposition: A | Discharge status: Home / Self Care | Payer: Managed Care, Other (non HMO) | Source: Ambulatory Visit | Attending: Family Medicine | Admitting: Family Medicine

## 2019-07-16 ENCOUNTER — Other Ambulatory Visit: Payer: Self-pay

## 2019-07-16 ENCOUNTER — Encounter: Payer: Self-pay | Admitting: Family Medicine

## 2019-07-16 ENCOUNTER — Other Ambulatory Visit: Payer: Self-pay | Admitting: Medical

## 2019-07-16 ENCOUNTER — Other Ambulatory Visit: Payer: Self-pay | Admitting: Family Medicine

## 2019-07-16 DIAGNOSIS — G8929 Other chronic pain: Secondary | ICD-10-CM

## 2019-07-16 DIAGNOSIS — M5136 Other intervertebral disc degeneration, lumbar region: Secondary | ICD-10-CM

## 2019-07-16 DIAGNOSIS — M5441 Lumbago with sciatica, right side: Secondary | ICD-10-CM

## 2019-07-16 MED ORDER — TIZANIDINE HCL 4 MG PO CAPS: ORAL_CAPSULE | ORAL | 0 refills | Status: DC

## 2019-07-16 MED ORDER — TRAMADOL HCL 50 MG PO TABS: 50.0000 mg | ORAL_TABLET | Freq: Three times a day (TID) | ORAL | 0 refills | Status: AC | PRN

## 2019-07-16 NOTE — Telephone Encounter (Signed)
Rx refill of muscle relaxant sent to pt pharmacy.

## 2019-07-25 ENCOUNTER — Other Ambulatory Visit: Payer: Self-pay | Admitting: Medical

## 2019-08-08 ENCOUNTER — Other Ambulatory Visit: Payer: Self-pay | Admitting: Medical

## 2019-08-21 ENCOUNTER — Telehealth: Payer: Self-pay | Admitting: Medical

## 2019-08-21 NOTE — Telephone Encounter (Signed)
LVM for pt to change appt for 08-22-2019 to an in person visit, if pt can not come we can do VOV in the afternoon for the sameday.

## 2019-08-22 ENCOUNTER — Other Ambulatory Visit: Payer: Self-pay

## 2019-08-22 ENCOUNTER — Telehealth: Payer: Managed Care, Other (non HMO) | Admitting: Medical

## 2019-08-22 ENCOUNTER — Ambulatory Visit (INDEPENDENT_AMBULATORY_CARE_PROVIDER_SITE_OTHER): Payer: Managed Care, Other (non HMO) | Admitting: Medical

## 2019-08-22 ENCOUNTER — Encounter: Payer: Self-pay | Admitting: Medical

## 2019-08-22 VITALS — BP 124/82 | HR 91 | Temp 97.7°F | Ht 62.0 in | Wt 180.0 lb

## 2019-08-22 DIAGNOSIS — R030 Elevated blood-pressure reading, without diagnosis of hypertension: Secondary | ICD-10-CM | POA: Diagnosis not present

## 2019-08-22 DIAGNOSIS — B001 Herpesviral vesicular dermatitis: Secondary | ICD-10-CM | POA: Diagnosis not present

## 2019-08-22 DIAGNOSIS — M5442 Lumbago with sciatica, left side: Secondary | ICD-10-CM

## 2019-08-22 DIAGNOSIS — B351 Tinea unguium: Secondary | ICD-10-CM

## 2019-08-22 DIAGNOSIS — R739 Hyperglycemia, unspecified: Secondary | ICD-10-CM

## 2019-08-22 DIAGNOSIS — E785 Hyperlipidemia, unspecified: Secondary | ICD-10-CM

## 2019-08-22 DIAGNOSIS — M5441 Lumbago with sciatica, right side: Secondary | ICD-10-CM

## 2019-08-22 DIAGNOSIS — L989 Disorder of the skin and subcutaneous tissue, unspecified: Secondary | ICD-10-CM

## 2019-08-22 MED ORDER — CICLOPIROX 8 % EX SOLN: Freq: Every day | CUTANEOUS | 0 refills | Status: DC

## 2019-08-22 MED ORDER — VALACYCLOVIR HCL 1 G PO TABS: 1000.0000 mg | ORAL_TABLET | Freq: Two times a day (BID) | ORAL | 0 refills | Status: DC

## 2019-08-22 MED ORDER — TIZANIDINE HCL 4 MG PO CAPS: ORAL_CAPSULE | ORAL | 2 refills | Status: DC

## 2019-08-22 MED ORDER — METFORMIN HCL 500 MG PO TABS: 500.0000 mg | ORAL_TABLET | Freq: Two times a day (BID) | ORAL | 1 refills | Status: DC

## 2019-08-22 NOTE — Progress Notes (Signed)
Subjective:    Patient ID: Ann Frederick, female    DOB: 1971-03-02, 48 y.o.   MRN: [id number]  HPI Virtual Visit via Video Note  I connected with Ann Frederick on 08/22/19 at  8:40 AM EDT by a video enabled telemedicine application and verified that I am speaking with the correct person using two identifiers.  Location: Patient: home Provider: office.   I discussed the limitations of evaluation and management by telemedicine and the availability of in person appointments. The patient expressed understanding and agreed to proceed.  History of Present Illness: Pt states she feels better with metformin. She states since being on med feels has more energy. Pt lost 6 lbs.  Pt bp other day when gave blood 138/88. Last bp was on very good. Maybe up due to pain. She also ran upstairs to get bp cuff before virtual visit.  Pt has some recent back pain. Hx of back pain. Pt has seen specialist. Pt had mri with some finding. Pt went conservative route of PT(delayed . But scheduling problematic/not done). Pain at night hurts to walk. Pain shooting from back and feels like leg might give out. Pain down left side.   MRI findings   IMPRESSION: 1. L5-S1 small central disc protrusion which contacts the right S1 nerve root. 2. Small noncompressive disc protrusions at T12-L1 and L2-3. 3. Mild facet spurring at L2-3 and L3-4. 4. T12 is non rib-bearing  Pt takes some tramadol for pain. Seems to help a lot with no side effects. She takes it very sparingly mostly at night.  Yellow discolaration to left great toe. Hx of toenail fungus in past. She took oral medication in the past. She wants to try topical tx not oral.  Hx of cold sores. She wants rx valtrex to treat eruption.  She wants screening exam with dermatologist. Old derm retired [facility]. She want [facility].  Pt has high mild  Cholesterol.  Sugars mild elevated in past but a1c normal. Decided to rx  metformin.   Observations/Objective: General-no acute distress, pleasant, oriented. Lungs- on inspection lungs appear unlabored. Neck- no tracheal deviation or jvd on inspection. Neuro- gross motor function appears intact.  Derm/nails- yellow discoloration to left great toenails.  Assessment and Plan: Patient does have history of mild elevated blood sugars but normal A1c.  We discussed this on past visit and decided to go ahead and prescribe metformin to get better sugar control but also see if she gets secondary benefit of some weight loss.  I am glad to see that she has lost 6 pounds.  Patient's had recent good blood pressure levels but today blood pressure was elevated with history of running upstairs to get blood pressure cuff before checking.  So asked patient to relax check her blood pressure later and update me on my chart.  Also some of her BP elevation might be associated with some chronic daily back pain.  For back pain with significant findings on lumbar MRI, will go ahead and refer to sports medicine to get conservative management advice and also to get her in with physical therapy.  Patient wants to hold off on getting any surgery for this and referral to PT through neurosurgeon office has been delayed.  Continue with tramadol for severe pain.  Most the time she just uses that at night.  Patient also has Zanaflex prescription she can use for flare of pain as well.  For history of cold sore eruptions, went ahead and  sent in refill of Valtrex.  For history of hyperlipidemia, did place future lab that she can get done sometime in October.  For elevated blood sugars in the past, place future order metabolic panel and [id number].  For described probable toenail fungus, I sent in prescription of Penlac.  Follow-up date likely 4 months possibly sooner after lab review.  Patient will get flu vaccine this year either at the pharmacy or in our office to nurse visit.  Follow Up  Instructions:    I discussed the assessment and treatment plan with the patient. The patient was provided an opportunity to ask questions and all were answered. The patient agreed with the plan and demonstrated an understanding of the instructions.   The patient was advised to call back or seek an in-person evaluation if the symptoms worsen or if the condition fails to improve as anticipated.  I provided 40 minutes of non-face-to-face time during this encounter.   Mackie Pai, PA-C    Review of Systems  Constitutional: Negative for chills, fatigue and fever.  Respiratory: Negative for chest tightness, shortness of breath and wheezing.   Cardiovascular: Negative for chest pain and palpitations.  Gastrointestinal: Negative for abdominal pain.  Skin:       See hpi and physical  Hematological: Negative for adenopathy. Does not bruise/bleed easily.  Psychiatric/Behavioral: Negative for behavioral problems.    Past Medical History:  Diagnosis Date  . Acne   . Anemia    history of  . Anxiety   . Depression   . Diabetes mellitus    gest this pregnancy  . GERD (gastroesophageal reflux disease)    worse while pregnant  . Headache    Migraines  . Heart murmur    ? heart murmur in past  . History of blood transfusion 2013  . Hypertension      Social History   Socioeconomic History  . Marital status: Married    Spouse name: Not on file  . Number of children: 2  . Years of education: Not on file  . Highest education level: Not on file  Occupational History  . Not on file  Social Needs  . Financial resource strain: Not on file  . Food insecurity    Worry: Not on file    Inability: Not on file  . Transportation needs    Medical: Not on file    Non-medical: Not on file  Tobacco Use  . Smoking status: Never Smoker  . Smokeless tobacco: Never Used  Substance and Sexual Activity  . Alcohol use: Yes    Comment: 1 glass of wine per week prior to preg  . Drug use: No   . Sexual activity: Yes  Lifestyle  . Physical activity    Days per week: Not on file    Minutes per session: Not on file  . Stress: Not on file  Relationships  . Social Herbalist on phone: Not on file    Gets together: Not on file    Attends religious service: Not on file    Active member of club or organization: Not on file    Attends meetings of clubs or organizations: Not on file    Relationship status: Not on file  . Intimate partner violence    Fear of current or ex partner: Not on file    Emotionally abused: Not on file    Physically abused: Not on file    Forced sexual activity: Not on file  Other Topics Concern  . Not on file  Social History Narrative  . Not on file    Past Surgical History:  Procedure Laterality Date  . CESAREAN SECTION     x 2  . CESAREAN SECTION  09/29/2012   Procedure: CESAREAN SECTION;  Surgeon: Luz Lex, MD;  Location: [facility];  Service: Obstetrics;  Laterality: N/A;  Repeat edc 10/12/12/REQUEST;Chassity,Dee,Colleen  . COLONOSCOPY    . ESOPHAGOGASTRODUODENOSCOPY  normal    7/12  . LAPAROSCOPIC VAGINAL HYSTERECTOMY WITH SALPINGECTOMY Bilateral 02/23/2016   Procedure: LAPAROSCOPIC ASSISTED VAGINAL HYSTERECTOMY WITH bilateral SALPINGECTOMY, right oophorectomy. laporotic repair of incidental cystotomy.;  Surgeon: Louretta Shorten, MD;  Location: [facility];  Service: Gynecology;  Laterality: Bilateral;  . MOUTH SURGERY      Family History  Problem Relation Age of Onset  . Cancer Father        ? lung CA  . Kidney disease Father   . Depression Mother   . Hypertension Mother   . Diabetes Mother   . Obesity Mother   . Thyroid disease Mother   . Hydrocephalus Sister   . Colon cancer Neg Hx     Allergies  Allergen Reactions  . Azithromycin Nausea And Vomiting    diarrhea    Current Outpatient Medications on File Prior to Visit  Medication Sig Dispense Refill  . aspirin 81 MG tablet Take 81 mg by mouth daily.    Marland Kitchen b complex  vitamins capsule Take 1 capsule by mouth daily.    Marland Kitchen gabapentin (NEURONTIN) 100 MG capsule TAKE 1 CAPSULE BY MOUTH THREE TIMES A DAY 270 capsule 1  . hydrochlorothiazide (MICROZIDE) 12.5 MG capsule Take 1 capsule (12.5 mg total) by mouth daily. 90 capsule 0  . meloxicam (MOBIC) 7.5 MG tablet TAKE 1 TO 2 TABLETS EVERY DAY 30 tablet 0  . promethazine-dextromethorphan (PROMETHAZINE-DM) 6.25-15 MG/5ML syrup Take 5 mLs by mouth 4 (four) times daily as needed. 118 mL 0  . zolpidem (AMBIEN) 10 MG tablet Take 10 mg by mouth at bedtime as needed for sleep.   5   No current facility-administered medications on file prior to visit.     BP (!) 167/97   Pulse (!) 107   Temp 97.7 F (36.5 C) (Temporal)   Ht 5\' 2"  (1.575 m)   Wt 180 lb (81.6 kg)   LMP 02/11/2016 (Exact Date)   BMI 32.92 kg/m       Objective:   Physical Exam        Assessment & Plan:

## 2019-08-22 NOTE — Patient Instructions (Addendum)
Patient does have history of mild elevated blood sugars but normal A1c.  We discussed this on past visit and decided to go ahead and prescribe metformin to get better sugar control but also see if she gets secondary benefit of some weight loss.  I am glad to see that she has lost 6 pounds.  Patient's had recent good blood pressure levels but today blood pressure was elevated with history of running upstairs to get blood pressure cuff before checking.  So asked patient to relax check her blood pressure later and update me on my chart.  Also some of her BP elevation might be associated with some chronic daily back pain.  For back pain with significant findings on lumbar MRI, will go ahead and refer to sports medicine to get conservative management advice and also to get her in with physical therapy.  Patient wants to hold off on getting any surgery for this and referral to PT through neurosurgeon office has been delayed.  Continue with tramadol for severe pain.  Most the time she just uses that at night.  Patient also has Zanaflex prescription she can use for flare of pain as well.  For history of cold sore eruptions, went ahead and sent in refill of Valtrex.  For history of hyperlipidemia, did place future lab that she can get done sometime in October.  For elevated blood sugars in the past, place future order metabolic panel and [id number].  For described probable toenail fungus, I sent in prescription of Penlac.  Follow-up date likely 4 months possibly sooner after lab review.  Patient will get flu vaccine this year either at the pharmacy or in our office to nurse visit.

## 2019-08-23 ENCOUNTER — Encounter: Payer: Self-pay | Admitting: Medical

## 2019-08-25 ENCOUNTER — Encounter: Payer: Self-pay | Admitting: Medical

## 2019-08-27 ENCOUNTER — Ambulatory Visit (INDEPENDENT_AMBULATORY_CARE_PROVIDER_SITE_OTHER): Payer: Managed Care, Other (non HMO) | Admitting: Family Medicine

## 2019-08-27 ENCOUNTER — Other Ambulatory Visit: Payer: Self-pay

## 2019-08-27 ENCOUNTER — Encounter: Payer: Self-pay | Admitting: Family Medicine

## 2019-08-27 ENCOUNTER — Ambulatory Visit: Payer: Self-pay

## 2019-08-27 VITALS — BP 110/78 | Ht 62.0 in | Wt 178.0 lb

## 2019-08-27 DIAGNOSIS — M25552 Pain in left hip: Secondary | ICD-10-CM

## 2019-08-27 MED ORDER — TRIAMCINOLONE ACETONIDE 40 MG/ML IJ SUSP
40.0000 mg | Freq: Once | INTRAMUSCULAR | Status: AC
  Administered 2019-08-27: 40 mg via INTRA_ARTICULAR

## 2019-08-27 NOTE — Assessment & Plan Note (Signed)
Pain seems to be more associated with the joint itself.  Seems less likely for radicular symptoms from the back.  Less likely for greater trochanteric pain syndrome. -Hip joint injection. -X-ray. -Counseled on home exercise therapy and supportive care. -May need to consider physical therapy or further imaging.

## 2019-08-27 NOTE — Addendum Note (Signed)
Addended by: Rosemarie Ax on: 08/27/2019 10:37 AM   Modules accepted: Level of Service

## 2019-08-27 NOTE — Patient Instructions (Signed)
Nice to meet you Please try ice  Please try tylenol  Please try the exercises   I will call you with the results from today.  Please send me a message in MyChart with any questions or updates.  Please see me back in 4 weeks.   --Dr. Raeford Razor

## 2019-08-27 NOTE — Progress Notes (Signed)
Ann Frederick - 48 y.o. female MRN [id number]  Date of birth: 07/09/1971  SUBJECTIVE:  Including CC & ROS.  Chief Complaint  Patient presents with  . Hip Pain    left hip    Ann Frederick is a 48 y.o. female that is presenting with left inguinal pain and left lateral hip pain.  The pain is been acute on chronic in nature.  The pain is becoming more constant and severe.  The pain is sharp and stabbing.  It is worse if she is walking or rising up from a seated position.  She fell and landed on the left lateral aspect of the hip about 4 months ago.  Unclear if that is associated with her symptoms today.  Has not had any improvement with modalities to date.  Pain can be occurring in the left gluteus as well as laterally and in the inguinal region.  It can be sharp and stabbing..  Independent review of the lumbar spine x-ray from 6/17 shows mild degenerative changes at different levels.  Independent review the MRI lumbar spine from 7/20 shows a small disc protrusion at L5-S1.   Review of Systems  Constitutional: Negative for fever.  HENT: Negative for congestion.   Respiratory: Negative for cough.   Cardiovascular: Negative for chest pain.  Gastrointestinal: Negative for abdominal pain.  Musculoskeletal: Positive for gait problem.  Skin: Negative for color change.  Neurological: Negative for weakness.  Hematological: Negative for adenopathy.    HISTORY: Past Medical, Surgical, Social, and Family History Reviewed & Updated per EMR.   Pertinent Historical Findings include:  Past Medical History:  Diagnosis Date  . Acne   . Anemia    history of  . Anxiety   . Depression   . Diabetes mellitus    gest this pregnancy  . GERD (gastroesophageal reflux disease)    worse while pregnant  . Headache    Migraines  . Heart murmur    ? heart murmur in past  . History of blood transfusion 2013  . Hypertension     Past Surgical History:  Procedure Laterality Date  . CESAREAN SECTION      x 2  . CESAREAN SECTION  09/29/2012   Procedure: CESAREAN SECTION;  Surgeon: Luz Lex, MD;  Location: [facility];  Service: Obstetrics;  Laterality: N/A;  Repeat edc 10/12/12/REQUEST;Chassity,Dee,Colleen  . COLONOSCOPY    . ESOPHAGOGASTRODUODENOSCOPY  normal    7/12  . LAPAROSCOPIC VAGINAL HYSTERECTOMY WITH SALPINGECTOMY Bilateral 02/23/2016   Procedure: LAPAROSCOPIC ASSISTED VAGINAL HYSTERECTOMY WITH bilateral SALPINGECTOMY, right oophorectomy. laporotic repair of incidental cystotomy.;  Surgeon: Louretta Shorten, MD;  Location: [facility];  Service: Gynecology;  Laterality: Bilateral;  . MOUTH SURGERY      Allergies  Allergen Reactions  . Azithromycin Nausea And Vomiting    diarrhea    Family History  Problem Relation Age of Onset  . Cancer Father        ? lung CA  . Kidney disease Father   . Depression Mother   . Hypertension Mother   . Diabetes Mother   . Obesity Mother   . Thyroid disease Mother   . Hydrocephalus Sister   . Colon cancer Neg Hx      Social History   Socioeconomic History  . Marital status: Married    Spouse name: Not on file  . Number of children: 2  . Years of education: Not on file  . Highest education level: Not on file  Occupational History  .  Not on file  Social Needs  . Financial resource strain: Not on file  . Food insecurity    Worry: Not on file    Inability: Not on file  . Transportation needs    Medical: Not on file    Non-medical: Not on file  Tobacco Use  . Smoking status: Never Smoker  . Smokeless tobacco: Never Used  Substance and Sexual Activity  . Alcohol use: Yes    Comment: 1 glass of wine per week prior to preg  . Drug use: No  . Sexual activity: Yes  Lifestyle  . Physical activity    Days per week: Not on file    Minutes per session: Not on file  . Stress: Not on file  Relationships  . Social Herbalist on phone: Not on file    Gets together: Not on file    Attends religious service: Not on file    Active  member of club or organization: Not on file    Attends meetings of clubs or organizations: Not on file    Relationship status: Not on file  . Intimate partner violence    Fear of current or ex partner: Not on file    Emotionally abused: Not on file    Physically abused: Not on file    Forced sexual activity: Not on file  Other Topics Concern  . Not on file  Social History Narrative  . Not on file     PHYSICAL EXAM:  VS: BP 110/78   Ht 5\' 2"  (1.575 m)   Wt 178 lb (80.7 kg)   LMP 02/11/2016 (Exact Date)   BMI 32.56 kg/m  Physical Exam Gen: NAD, alert, cooperative with exam, well-appearing ENT: normal lips, normal nasal mucosa,  Eye: normal EOM, normal conjunctiva and lids CV:  no edema, +2 pedal pulses   Resp: no accessory muscle use, non-labored,  GI: no masses or tenderness, no hernia  Skin: no rashes, no areas of induration  Neuro: normal tone, normal sensation to touch Psych:  normal insight, alert and oriented MSK:  Left hip: Normal internal and external rotation. Normal strength resistance with hip flexion. No significant tenderness palpation of the greater trochanter. Positive FADIR  Negative FABER  Negative straight leg raise. Neurovascular intact   Aspiration/Injection Procedure Note Ann Frederick 1971-01-03  Procedure: Injection Indications: Left hip pain  Procedure Details Consent: Risks of procedure as well as the alternatives and risks of each were explained to the (patient/caregiver).  Consent for procedure obtained. Time Out: Verified patient identification, verified procedure, site/side was marked, verified correct patient position, special equipment/implants available, medications/allergies/relevent history reviewed, required imaging and test results available.  Performed.  The area was cleaned with iodine and alcohol swabs.    The left hip joint was injected using 1 cc's of 40 mg Kenalog and 4 cc's of 0.25% bupivacaine with a 22 3 1/2" needle.   Ultrasound was used. Images were obtained in long views showing the injection.     A sterile dressing was applied.  Patient did tolerate procedure well.       ASSESSMENT & PLAN:   Left hip pain Pain seems to be more associated with the joint itself.  Seems less likely for radicular symptoms from the back.  Less likely for greater trochanteric pain syndrome. -Hip joint injection. -X-ray. -Counseled on home exercise therapy and supportive care. -May need to consider physical therapy or further imaging.

## 2019-08-28 ENCOUNTER — Ambulatory Visit (HOSPITAL_BASED_OUTPATIENT_CLINIC_OR_DEPARTMENT_OTHER)
Admission: RE | Admit: 2019-08-28 | Discharge: 2019-08-28 | Disposition: A | Discharge status: Home / Self Care | Payer: Managed Care, Other (non HMO) | Source: Ambulatory Visit | Attending: Family Medicine | Admitting: Family Medicine

## 2019-08-28 ENCOUNTER — Telehealth: Payer: Self-pay | Admitting: Family Medicine

## 2019-08-28 DIAGNOSIS — M25552 Pain in left hip: Secondary | ICD-10-CM | POA: Insufficient documentation

## 2019-08-28 NOTE — Telephone Encounter (Signed)
Left VM for patient. If she calls back please have her speak with a nurse/CMA and inform that her xray is showing moderate degenerative changes on the superior aspect of the joint.   If any questions then please take the best time and phone number to call and I will try to call her back.   Rosemarie Ax, MD [facility] 08/28/2019, 4:56 PM

## 2019-08-29 ENCOUNTER — Other Ambulatory Visit: Payer: Self-pay | Admitting: Family Medicine

## 2019-08-29 DIAGNOSIS — M25552 Pain in left hip: Secondary | ICD-10-CM

## 2019-08-31 ENCOUNTER — Encounter: Payer: Self-pay | Admitting: Medical

## 2019-09-05 NOTE — Telephone Encounter (Signed)
Patient came into the office to have two forms fill out for school. One is an exemption for a flu shot and the other form is for mental capabilities. Please call when ready for pick up. Placed in provider tray.

## 2019-09-07 NOTE — Telephone Encounter (Signed)
Pt calling to ask if her forms are ready for pick up? Call: [phone]

## 2019-09-10 ENCOUNTER — Ambulatory Visit: Payer: Managed Care, Other (non HMO) | Attending: Family Medicine | Admitting: Physical Therapy

## 2019-09-10 ENCOUNTER — Telehealth: Payer: Self-pay

## 2019-09-10 NOTE — Telephone Encounter (Signed)
Copied from [place] [id number]. Topic: General - Call Back - No Documentation >> Sep 10, 2019 11:18 AM Erick Blinks wrote: Reason for CRM: Pt states that deadline for paperwork was Friday 09/07/2019, requesting follow up via mychart. Please advise  Best contact: [phone] VM

## 2019-09-10 NOTE — Telephone Encounter (Signed)
I have been looking at my folders and do not remember seeing this form. First time I saw form was today. Also pt need to be told when they drop off forms the soonest turn around time is one week. No one should drop off form and expect 2 day turn around time unless I am immediately notified when they drop off and agree.  Sending. Ask her to schedule phone visit. So I can have dedicated time to fill out paperwork tomorrow. There are some questions about emotional state.  Physical capacity for school program that I just want to see where she is. Particularly left hip pain and back pain. As form state limited or unlimited on physcial activity.Also want to discuss her contraindiction to flu vaccine.

## 2019-09-11 ENCOUNTER — Other Ambulatory Visit: Payer: Self-pay

## 2019-09-11 ENCOUNTER — Ambulatory Visit (INDEPENDENT_AMBULATORY_CARE_PROVIDER_SITE_OTHER): Payer: Self-pay | Admitting: Medical

## 2019-09-11 ENCOUNTER — Encounter: Payer: Self-pay | Admitting: Medical

## 2019-09-11 VITALS — BP 150/98 | HR 94 | Temp 97.3°F | Wt 175.0 lb

## 2019-09-11 DIAGNOSIS — M25552 Pain in left hip: Secondary | ICD-10-CM

## 2019-09-11 DIAGNOSIS — I10 Essential (primary) hypertension: Secondary | ICD-10-CM

## 2019-09-11 NOTE — Telephone Encounter (Signed)
Pt needs Virtual visit

## 2019-09-11 NOTE — Progress Notes (Signed)
   Subjective:    Patient ID: Ann Frederick, female    DOB: 1971-02-12, 48 y.o.   MRN: [id number]  HPI Virtual Visit via Video Note  I connected with Ann Frederick on 09/11/19 at  1:00 PM EDT by a video enabled telemedicine application and verified that I am speaking with the correct person using two identifiers.  Location: Patient: home Provider: office.   I discussed the limitations of evaluation and management by telemedicine and the availability of in person appointments. The patient expressed understanding and agreed to proceed.  History of Present Illness: Pt has appointment to fill out paperwork. I saw paperwork yesterday first time and saw note due on 09-07-2019. So ask staff to call and get her scheduled.    Pt back pain is improved. But she does of some hip pain working up. She is getting MSN. 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  Pt states for hip pain she took prednisone. Her hip pain is less. She thinks tylenol helps hip pain. She uses tramadol occasionally. 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 Plan to do mri.  Pt state mood is stable.  Pt plans to get flu vaccine. Just was waiting until finished taper prednisone.   Observations/Objective: General-no acute distress, pleasant, oriented. Lungs- on inspection lungs appear unlabored. Neck- no tracheal deviation or jvd on inspection. Neuro- gross motor function appears intact.   Assessment and Plan: I filled out your school form today as we discussed. Placed up front so you can pick up.  Bp is high today. Check bp at home 3 separate times 10 minutes apart to get accurate reading. If bp still high above 140/90 then would add ARB type med.  Glad to hear hip pain improving. Continue with sport med MD plan.  Follow up as needed.  Please my chart me update bp readings.  Follow Up Instructions:    I discussed the assessment and treatment plan with the patient. The patient was provided an opportunity to  ask questions and all were answered. The patient agreed with the plan and demonstrated an understanding of the instructions.   The patient was advised to call back or seek an in-person evaluation if the symptoms worsen or if the condition fails to improve as anticipated.  I provided 25 minutes of non-face-to-face time during this encounter.   Mackie Pai, PA-C    Review of Systems     Objective:   Physical Exam        Assessment & Plan:

## 2019-09-11 NOTE — Patient Instructions (Addendum)
I filled out your school form today as we discussed. Placed up front so you can pick up.  Bp is high today. Check bp at home 3 separate times 10 minutes apart to get accurate reading. If bp still high above 140/90 then would add ARB type med.  Glad to hear hip pain improving. Continue with sport med MD plan.  Follow up as needed.  Please my chart me update bp readings.

## 2019-09-23 ENCOUNTER — Other Ambulatory Visit: Payer: Self-pay | Admitting: Medical

## 2019-09-24 ENCOUNTER — Ambulatory Visit: Payer: Managed Care, Other (non HMO) | Admitting: Family Medicine

## 2019-10-02 ENCOUNTER — Telehealth: Payer: Self-pay | Admitting: Medical

## 2019-10-02 NOTE — Telephone Encounter (Signed)
I have preop form . I want to see pt in office. Get updated labs and ekg before determining clearance. Please get her scheduled.

## 2019-10-03 NOTE — Telephone Encounter (Signed)
Pt need appointment

## 2019-10-18 ENCOUNTER — Ambulatory Visit: Payer: Self-pay | Admitting: Family Medicine

## 2019-10-18 ENCOUNTER — Encounter: Payer: Self-pay | Admitting: Medical

## 2019-10-18 ENCOUNTER — Telehealth: Payer: Self-pay | Admitting: Medical

## 2019-10-18 ENCOUNTER — Ambulatory Visit (INDEPENDENT_AMBULATORY_CARE_PROVIDER_SITE_OTHER): Payer: Managed Care, Other (non HMO) | Admitting: Medical

## 2019-10-18 ENCOUNTER — Other Ambulatory Visit: Payer: Self-pay

## 2019-10-18 VITALS — BP 131/92 | HR 84 | Temp 96.8°F | Resp 16 | Ht 62.0 in | Wt 172.2 lb

## 2019-10-18 DIAGNOSIS — M25552 Pain in left hip: Secondary | ICD-10-CM

## 2019-10-18 DIAGNOSIS — Z01818 Encounter for other preprocedural examination: Secondary | ICD-10-CM | POA: Diagnosis not present

## 2019-10-18 DIAGNOSIS — R739 Hyperglycemia, unspecified: Secondary | ICD-10-CM | POA: Diagnosis not present

## 2019-10-18 DIAGNOSIS — E785 Hyperlipidemia, unspecified: Secondary | ICD-10-CM | POA: Diagnosis not present

## 2019-10-18 LAB — CBC WITH DIFFERENTIAL/PLATELET
Basophils Absolute: 0.1 10*3/uL (ref 0.0–0.1)
Basophils Relative: 0.9 % (ref 0.0–3.0)
Eosinophils Absolute: 0.2 10*3/uL (ref 0.0–0.7)
Eosinophils Relative: 2.3 % (ref 0.0–5.0)
HCT: 36.7 % (ref 36.0–46.0)
Hemoglobin: 12.4 g/dL (ref 12.0–15.0)
Lymphocytes Relative: 28.5 % (ref 12.0–46.0)
Lymphs Abs: 2.8 10*3/uL (ref 0.7–4.0)
MCHC: 33.7 g/dL (ref 30.0–36.0)
MCV: 89.1 fl (ref 78.0–100.0)
Monocytes Absolute: 0.5 10*3/uL (ref 0.1–1.0)
Monocytes Relative: 4.9 % (ref 3.0–12.0)
Neutro Abs: 6.3 10*3/uL (ref 1.4–7.7)
Neutrophils Relative %: 63.4 % (ref 43.0–77.0)
Platelets: 413 10*3/uL — ABNORMAL HIGH (ref 150.0–400.0)
RBC: 4.12 Mil/uL (ref 3.87–5.11)
RDW: 13.8 % (ref 11.5–15.5)
WBC: 10 10*3/uL (ref 4.0–10.5)

## 2019-10-18 LAB — POC URINALSYSI DIPSTICK (AUTOMATED)
Blood, UA: NEGATIVE
Glucose, UA: NEGATIVE
Ketones, UA: 5
Leukocytes, UA: NEGATIVE
Nitrite, UA: NEGATIVE
Protein, UA: POSITIVE — AB
Spec Grav, UA: >=1.03 — AB (ref 1.010–1.025)
Urobilinogen, UA: 0.2 E.U./dL
pH, UA: 5.5 (ref 5.0–8.0)

## 2019-10-18 LAB — COMPREHENSIVE METABOLIC PANEL
ALT: 9 U/L (ref 0–35)
AST: 12 U/L (ref 0–37)
Albumin: 4.3 g/dL (ref 3.5–5.2)
Alkaline Phosphatase: 55 U/L (ref 39–117)
BUN: 8 mg/dL (ref 6–23)
CO2: 32 mEq/L (ref 19–32)
Calcium: 9.9 mg/dL (ref 8.4–10.5)
Chloride: 101 mEq/L (ref 96–112)
Creatinine, Ser: 0.79 mg/dL (ref 0.40–1.20)
GFR: 93.97 mL/min (ref >60.00)
Glucose, Bld: 93 mg/dL (ref 70–99)
Potassium: 3.7 mEq/L (ref 3.5–5.1)
Sodium: 141 mEq/L (ref 135–145)
Total Bilirubin: 0.5 mg/dL (ref 0.2–1.2)
Total Protein: 6.7 g/dL (ref 6.0–8.3)

## 2019-10-18 LAB — LIPID PANEL
Cholesterol: 227 mg/dL — ABNORMAL HIGH (ref 0–200)
HDL: 48.6 mg/dL (ref >39.00)
LDL Cholesterol: 153 mg/dL — ABNORMAL HIGH (ref 0–99)
NonHDL: 178.26
Total CHOL/HDL Ratio: 5
Triglycerides: 125 mg/dL (ref 0.0–149.0)
VLDL: 25 mg/dL (ref 0.0–40.0)

## 2019-10-18 LAB — PROTIME-INR
INR: 1 ratio (ref 0.8–1.0)
Prothrombin Time: 11.9 s (ref 9.6–13.1)

## 2019-10-18 LAB — HEMOGLOBIN A1C: Hgb A1c MFr Bld: 5.3 % (ref 4.6–6.5)

## 2019-10-18 NOTE — Progress Notes (Signed)
Subjective:    Patient ID: Ann Frederick, female    DOB: June 30, 1971, 48 y.o.   MRN: [id number]  HPI  Pt in for pre-op evaluation for surgery November 16 th. Will be left total hip surgery. Pt now using cane for over a month.   Pt had ekg done today.   Pt had prior surgeries with no complications. Has handled anesthesia well.  Pt states indocin has helped a lot with pain. She tolerates it ok.    Review of Systems  Constitutional: Negative for chills and fatigue.  Respiratory: Negative for cough, chest tightness, shortness of breath and wheezing.   Cardiovascular: Negative for chest pain and palpitations.  Gastrointestinal: Negative for abdominal pain.  Musculoskeletal:       Rt hip pain.  Skin: Negative for rash.  Neurological: Negative for dizziness, speech difficulty, weakness, numbness and headaches.  Hematological: Negative for adenopathy. Does not bruise/bleed easily.  Psychiatric/Behavioral: Negative for behavioral problems and confusion.    Past Medical History:  Diagnosis Date  . Acne   . Anemia    history of  . Anxiety   . Depression   . Diabetes mellitus    gest this pregnancy  . GERD (gastroesophageal reflux disease)    worse while pregnant  . Headache    Migraines  . Heart murmur    ? heart murmur in past  . History of blood transfusion 2013  . Hypertension      Social History   Socioeconomic History  . Marital status: Married    Spouse name: Not on file  . Number of children: 2  . Years of education: Not on file  . Highest education level: Not on file  Occupational History  . Not on file  Social Needs  . Financial resource strain: Not on file  . Food insecurity    Worry: Not on file    Inability: Not on file  . Transportation needs    Medical: Not on file    Non-medical: Not on file  Tobacco Use  . Smoking status: Never Smoker  . Smokeless tobacco: Never Used  Substance and Sexual Activity  . Alcohol use: Yes    Comment: 1 glass of  wine per week prior to preg  . Drug use: No  . Sexual activity: Yes  Lifestyle  . Physical activity    Days per week: Not on file    Minutes per session: Not on file  . Stress: Not on file  Relationships  . Social Herbalist on phone: Not on file    Gets together: Not on file    Attends religious service: Not on file    Active member of club or organization: Not on file    Attends meetings of clubs or organizations: Not on file    Relationship status: Not on file  . Intimate partner violence    Fear of current or ex partner: Not on file    Emotionally abused: Not on file    Physically abused: Not on file    Forced sexual activity: Not on file  Other Topics Concern  . Not on file  Social History Narrative  . Not on file    Past Surgical History:  Procedure Laterality Date  . CESAREAN SECTION     x 2  . CESAREAN SECTION  09/29/2012   Procedure: CESAREAN SECTION;  Surgeon: Luz Lex, MD;  Location: [facility];  Service: Obstetrics;  Laterality: N/A;  Repeat  edc 10/12/12/REQUEST;Chassity,Dee,Colleen  . COLONOSCOPY    . ESOPHAGOGASTRODUODENOSCOPY  normal    7/12  . LAPAROSCOPIC VAGINAL HYSTERECTOMY WITH SALPINGECTOMY Bilateral 02/23/2016   Procedure: LAPAROSCOPIC ASSISTED VAGINAL HYSTERECTOMY WITH bilateral SALPINGECTOMY, right oophorectomy. laporotic repair of incidental cystotomy.;  Surgeon: Louretta Shorten, MD;  Location: [facility];  Service: Gynecology;  Laterality: Bilateral;  . MOUTH SURGERY      Family History  Problem Relation Age of Onset  . Cancer Father        ? lung CA  . Kidney disease Father   . Depression Mother   . Hypertension Mother   . Diabetes Mother   . Obesity Mother   . Thyroid disease Mother   . Hydrocephalus Sister   . Colon cancer Neg Hx     Allergies  Allergen Reactions  . Azithromycin Nausea And Vomiting    diarrhea    Current Outpatient Medications on File Prior to Visit  Medication Sig Dispense Refill  . indomethacin (INDOCIN) 25  MG capsule 25 mg.    . aspirin 81 MG tablet Take 81 mg by mouth daily.    Marland Kitchen b complex vitamins capsule Take 1 capsule by mouth daily.    . ciclopirox (PENLAC) 8 % solution Apply topically at bedtime. Apply over nail and surrounding skin. Apply daily over previous coat. After seven (7) days, may remove with alcohol and continue cycle. 6.6 mL 0  . gabapentin (NEURONTIN) 100 MG capsule TAKE 1 CAPSULE BY MOUTH THREE TIMES A DAY 270 capsule 1  . hydrochlorothiazide (MICROZIDE) 12.5 MG capsule TAKE 1 CAPSULE BY MOUTH EVERY DAY 90 capsule 0  . metFORMIN (GLUCOPHAGE) 500 MG tablet Take 1 tablet (500 mg total) by mouth 2 (two) times daily with a meal. 180 tablet 1  . tiZANidine (ZANAFLEX) 4 MG capsule 1 tab po q hs 10 capsule 2  . valACYclovir (VALTREX) 1000 MG tablet Take 1 tablet (1,000 mg total) by mouth 2 (two) times daily. 20 tablet 0  . zolpidem (AMBIEN) 10 MG tablet Take 10 mg by mouth at bedtime as needed for sleep.   5   No current facility-administered medications on file prior to visit.     BP (!) 131/92   Pulse 84   Temp (!) 96.8 F (36 C) (Temporal)   Resp 16   Ht 5\' 2"  (1.575 m)   Wt 172 lb 3.2 oz (78.1 kg)   LMP 02/11/2016 (Exact Date)   SpO2 99%   BMI 31.50 kg/m       Objective:   Physical Exam  General Mental Status- Alert. General Appearance- Not in acute distress.   Skin General: Color- Normal Color. Moisture- Normal Moisture.  Neck Carotid Arteries- Normal color. Moisture- Normal Moisture. No carotid bruits. No JVD.  Chest and Lung Exam Auscultation: Breath Sounds:-Normal.  Cardiovascular Auscultation:Rythm- Regular. Murmurs & Other Heart Sounds:Auscultation of the heart reveals- No Murmurs.  Abdomen Inspection:-Inspeection Normal. Palpation/Percussion:Note:No mass. Palpation and Percussion of the abdomen reveal- Non Tender, Non Distended + BS, no rebound or guarding.  Neurologic Cranial Nerve exam:- CN III-XII intact(No nystagmus), symmetric smile.  Strength:- 5/5 equal and symmetric strength both upper and lower extremities.  Left hip pain- pain on range of motion.      Assessment & Plan:  You have normal physical exam today. ekg showed nsr.  Will get requested pre-op labs. Then fill out pre-op evaluation form.  Expect to have paperwork filled out by middle of next week.  Follow up as needed  25 minutes  spent with pt today. 50% of time spent counseling pt on plan going forward. Answered pt questions regarding he questions on surgical risk.

## 2019-10-18 NOTE — Telephone Encounter (Signed)
I filled out preoperative risk evaluation sheet. Please fax over tomorrow.

## 2019-10-18 NOTE — Patient Instructions (Addendum)
You have normal physical exam today. ekg showed nsr.  Will get requested pre-op labs. Then fill out pre-op evaluation form.  Expect to have paperwork filled out by middle of next week.  Follow up as needed

## 2019-10-22 ENCOUNTER — Telehealth: Payer: Self-pay | Admitting: Medical

## 2019-10-22 MED ORDER — EZETIMIBE 10 MG PO TABS: 10.0000 mg | ORAL_TABLET | Freq: Every day | ORAL | 3 refills | Status: DC

## 2019-10-22 NOTE — Telephone Encounter (Signed)
Rx zetia sent to pharmacy. 

## 2019-10-31 ENCOUNTER — Other Ambulatory Visit: Payer: Self-pay | Admitting: Medical

## 2019-11-01 MED ORDER — HYDROCHLOROTHIAZIDE 12.5 MG PO CAPS: 12.5000 mg | ORAL_CAPSULE | Freq: Every day | ORAL | 0 refills | Status: DC

## 2019-11-06 ENCOUNTER — Other Ambulatory Visit: Payer: Self-pay | Admitting: Medical

## 2019-11-07 MED ORDER — HYDROCHLOROTHIAZIDE 12.5 MG PO CAPS: 12.5000 mg | ORAL_CAPSULE | Freq: Every day | ORAL | 1 refills | Status: DC

## 2019-12-05 ENCOUNTER — Other Ambulatory Visit: Payer: Self-pay

## 2019-12-05 ENCOUNTER — Ambulatory Visit (INDEPENDENT_AMBULATORY_CARE_PROVIDER_SITE_OTHER): Payer: Managed Care, Other (non HMO) | Admitting: Medical

## 2019-12-05 ENCOUNTER — Encounter: Payer: Self-pay | Admitting: Medical

## 2019-12-05 VITALS — BP 119/79 | HR 92 | Wt 170.0 lb

## 2019-12-05 DIAGNOSIS — Z90710 Acquired absence of both cervix and uterus: Secondary | ICD-10-CM | POA: Diagnosis not present

## 2019-12-05 DIAGNOSIS — R232 Flushing: Secondary | ICD-10-CM | POA: Diagnosis not present

## 2019-12-05 NOTE — Progress Notes (Signed)
   Subjective:    Patient ID: Ann Frederick, female    DOB: 1971/10/17, 48 y.o.   MRN: [id number]  HPI  Virtual Visit via Video Note  I connected with Ann Frederick on 12/05/19 at  9:00 AM EST by a video enabled telemedicine application and verified that I am speaking with the correct person using two identifiers.  Location: Patient: home Provider: office   I discussed the limitations of evaluation and management by telemedicine and the availability of in person appointments. The patient expressed understanding and agreed to proceed.    History of Present Illness:  Pt states she is doing well.   She had total left side hip xray just recently. She has been on restrictions while recovering.  Pt expressed concern about osteoporosis and osteopenia. I don't see these finding on imaging study review. But I don't see mri of hip results.   Hysterectomy and rt ovary removed.  Pt not sure if she had fsh done recently. Thinks occasional hot flashes but not frequently.  Mom went through menopause in 67's.   Pt does not smoke, no heavy alcohol and no steroid use.     Observations/Objective: General-no acute distress, pleasant, oriented. Lungs- on inspection lungs appear unlabored. Neck- no tracheal deviation or jvd on inspection. Neuro- gross motor function appears intact.   Assessment and Plan: On review I don't see osteopenia or osteoporosis finding on imaging studies. Please send me mri of hip studies done in past. Presently recommend get fsh, take calcium 1000 mg daily and vit d 600 IU daily. Try to get daily weight bearing exercises in.   Call and get scheduled for the fsh study. On review of risk factors and age do not think hormone therapy recommended.   Follow up date to be determined after mri hip review and fsh review.  Mackie Pai, PA-C  Follow Up Instructions:    I discussed the assessment and treatment plan with the patient. The patient was provided an  opportunity to ask questions and all were answered. The patient agreed with the plan and demonstrated an understanding of the instructions.   The patient was advised to call back or seek an in-person evaluation if the symptoms worsen or if the condition fails to improve as anticipated.  I provided 25 minutes of non-face-to-face time during this encounter.   Mackie Pai, PA-C   Review of Systems     Objective:   Physical Exam        Assessment & Plan:

## 2019-12-05 NOTE — Patient Instructions (Signed)
On review I don't see osteopenia or osteoporosis finding on imaging studies. Please send me mri of hip studies done in past. Presently recommend get fsh, take calcium 1000 mg daily and vit d 600 IU daily. Try to get daily weight bearing exercises in.   Call and get scheduled for the fsh study. On review of risk factors and age do not think hormone therapy recommended.   Follow up date to be determined after mri hip review and fsh review.

## 2020-01-01 DIAGNOSIS — M6281 Muscle weakness (generalized): Secondary | ICD-10-CM | POA: Diagnosis not present

## 2020-01-01 DIAGNOSIS — M542 Cervicalgia: Secondary | ICD-10-CM | POA: Diagnosis not present

## 2020-01-01 DIAGNOSIS — M4322 Fusion of spine, cervical region: Secondary | ICD-10-CM | POA: Diagnosis not present

## 2020-01-01 DIAGNOSIS — R293 Abnormal posture: Secondary | ICD-10-CM | POA: Diagnosis not present

## 2020-01-07 DIAGNOSIS — M6281 Muscle weakness (generalized): Secondary | ICD-10-CM | POA: Diagnosis not present

## 2020-01-07 DIAGNOSIS — M542 Cervicalgia: Secondary | ICD-10-CM | POA: Diagnosis not present

## 2020-01-07 DIAGNOSIS — M4322 Fusion of spine, cervical region: Secondary | ICD-10-CM | POA: Diagnosis not present

## 2020-01-07 DIAGNOSIS — R293 Abnormal posture: Secondary | ICD-10-CM | POA: Diagnosis not present

## 2020-01-22 DIAGNOSIS — M542 Cervicalgia: Secondary | ICD-10-CM | POA: Diagnosis not present

## 2020-01-22 DIAGNOSIS — Z6832 Body mass index (BMI) 32.0-32.9, adult: Secondary | ICD-10-CM | POA: Diagnosis not present

## 2020-02-07 DIAGNOSIS — Z96642 Presence of left artificial hip joint: Secondary | ICD-10-CM | POA: Diagnosis not present

## 2020-02-07 DIAGNOSIS — Z471 Aftercare following joint replacement surgery: Secondary | ICD-10-CM | POA: Diagnosis not present

## 2020-03-04 ENCOUNTER — Telehealth: Payer: Managed Care, Other (non HMO) | Admitting: Physician Assistant

## 2020-03-04 DIAGNOSIS — H1032 Unspecified acute conjunctivitis, left eye: Secondary | ICD-10-CM

## 2020-03-04 MED ORDER — OFLOXACIN 0.3 % OP SOLN: 1.0000 [drp] | Freq: Four times a day (QID) | OPHTHALMIC | 0 refills | Status: DC

## 2020-03-04 NOTE — Progress Notes (Signed)
E-Visit for [employer]   We are sorry that you are not feeling well.  Here is how we plan to help!  Based on what you have shared with me it looks like you have conjunctivitis.  Conjunctivitis is a common inflammatory or infectious condition of the eye that is often referred to as "pink eye".  In most cases it is contagious (viral or bacterial). However, not all conjunctivitis requires antibiotics (ex. Allergic).  We have made appropriate suggestions for you based upon your presentation.  I have prescribed Oflaxacin 1-2 drops 4 times a day times 5 days    Please do not wear your contacts until symptoms have resolved completely and then utilize a new pair.  If no improvement in 1-2 days you should see an ophthalmologist in a face-to-face visit.  Pink eye can be highly contagious.  It is typically spread through direct contact with secretions, or contaminated objects or surfaces that one may have touched.  Strict handwashing is suggested with soap and water is urged.  If not available, use alcohol based had sanitizer.  Avoid unnecessary touching of the eye.  If you wear contact lenses, you will need to refrain from wearing them until you see no white discharge from the eye for at least 24 hours after being on medication.  You should see symptom improvement in 1-2 days after starting the medication regimen.   Home Care:  Wash your hands often!  Do not wear your contacts until you complete your treatment plan.  Avoid sharing towels, bed linen, personal items with a person who has pink eye.  See attention for anyone in your home with similar symptoms.  Get Help Right Away If:  Your symptoms do not improve.  You develop blurred or loss of vision.  Your symptoms worsen (increased discharge, pain or redness)  Your e-visit answers were reviewed by a board certified advanced clinical practitioner to complete your personal care plan.  Depending on the condition, your plan could have included both  over the counter or prescription medications.  If there is a problem please reply  once you have received a response from your provider.  Your safety is important to [place].  If you have drug allergies check your prescription carefully.    You can use MyChart to ask questions about today's visit, request a non-urgent call back, or ask for a work or school excuse for 24 hours related to this e-Visit. If it has been greater than 24 hours you will need to follow up with your provider, or enter a new e-Visit to address those concerns.   You will get an e-mail in the next two days asking about your experience.  I hope that your e-visit has been valuable and will speed your recovery. Thank you for using e-visits.   Greater than 5 minutes, yet less than 10 minutes of time have been spent researching, coordinating, and implementing care for this patient today

## 2020-03-05 ENCOUNTER — Other Ambulatory Visit: Payer: Self-pay | Admitting: Medical

## 2020-04-24 DIAGNOSIS — Z6833 Body mass index (BMI) 33.0-33.9, adult: Secondary | ICD-10-CM | POA: Diagnosis not present

## 2020-04-24 DIAGNOSIS — Z01419 Encounter for gynecological examination (general) (routine) without abnormal findings: Secondary | ICD-10-CM | POA: Diagnosis not present

## 2020-04-29 ENCOUNTER — Ambulatory Visit (INDEPENDENT_AMBULATORY_CARE_PROVIDER_SITE_OTHER): Payer: BC Managed Care – PPO | Admitting: Medical

## 2020-04-29 ENCOUNTER — Other Ambulatory Visit: Payer: Self-pay

## 2020-04-29 VITALS — BP 131/77 | HR 95 | Temp 97.3°F | Resp 18 | Ht 62.0 in | Wt 180.0 lb

## 2020-04-29 DIAGNOSIS — R252 Cramp and spasm: Secondary | ICD-10-CM | POA: Diagnosis not present

## 2020-04-29 DIAGNOSIS — M25552 Pain in left hip: Secondary | ICD-10-CM

## 2020-04-29 DIAGNOSIS — I10 Essential (primary) hypertension: Secondary | ICD-10-CM | POA: Diagnosis not present

## 2020-04-29 DIAGNOSIS — R739 Hyperglycemia, unspecified: Secondary | ICD-10-CM

## 2020-04-29 DIAGNOSIS — E785 Hyperlipidemia, unspecified: Secondary | ICD-10-CM

## 2020-04-29 LAB — COMPREHENSIVE METABOLIC PANEL
ALT: 8 U/L (ref 0–35)
AST: 10 U/L (ref 0–37)
Albumin: 4.1 g/dL (ref 3.5–5.2)
Alkaline Phosphatase: 56 U/L (ref 39–117)
BUN: 9 mg/dL (ref 6–23)
CO2: 27 mEq/L (ref 19–32)
Calcium: 9.2 mg/dL (ref 8.4–10.5)
Chloride: 102 mEq/L (ref 96–112)
Creatinine, Ser: 0.85 mg/dL (ref 0.40–1.20)
GFR: 86.17 mL/min (ref >60.00)
Glucose, Bld: 98 mg/dL (ref 70–99)
Potassium: 4 mEq/L (ref 3.5–5.1)
Sodium: 137 mEq/L (ref 135–145)
Total Bilirubin: 0.6 mg/dL (ref 0.2–1.2)
Total Protein: 6.8 g/dL (ref 6.0–8.3)

## 2020-04-29 LAB — LDL CHOLESTEROL, DIRECT: Direct LDL: 96 mg/dL

## 2020-04-29 LAB — MAGNESIUM: Magnesium: 2 mg/dL (ref 1.5–2.5)

## 2020-04-29 LAB — LIPID PANEL
Cholesterol: 198 mg/dL (ref 0–200)
HDL: 44.8 mg/dL (ref >39.00)
NonHDL: 153.42
Total CHOL/HDL Ratio: 4
Triglycerides: 231 mg/dL — ABNORMAL HIGH (ref 0.0–149.0)
VLDL: 46.2 mg/dL — ABNORMAL HIGH (ref 0.0–40.0)

## 2020-04-29 LAB — HEMOGLOBIN A1C: Hgb A1c MFr Bld: 5.2 % (ref 4.6–6.5)

## 2020-04-29 NOTE — Patient Instructions (Addendum)
For htn continue with hctz. Fairly good control. Check bp at home.   For metabolic syndrome and elevated sugar recheck a1c and continue metformin.  For high cholesterol recheck lipid panel.  For hip pain can use diclofenac. Note can increase blood pressure. So if checking bp and above 140/90 can add arb.  For occasional muscle cramp add mg level.  For depressed mood continue wellbutrin. Let me know when you need refill.   Follow up date to be determined after lab review.

## 2020-04-29 NOTE — Progress Notes (Signed)
Subjective:    Patient ID: Ann Frederick, female    DOB: [birth year], 49 y.o.   MRN: [id number]  HPI  Pt bp is well controlled today. Pt is on hctz. Not taking her bp at home.  Pt a1c well controlled. Pt has metabolic syndrome. She is on metformin.  Hx of high cholesterol. I did write zetia in past but she has not taken.  Pt is on wellbutrin to help with mood. Some mild depression. It does help with her mood.  Pt has rt hip pain. May need surgery. She is using diclofenac. She thinks this may be increasing her bp.   Review of Systems  Constitutional: Negative for chills, fatigue and fever.  Respiratory: Negative for cough, chest tightness, shortness of breath and wheezing.   Cardiovascular: Negative for chest pain and palpitations.  Gastrointestinal: Negative for abdominal pain, diarrhea, nausea and vomiting.  Musculoskeletal: Negative for back pain and myalgias.  Skin: Negative for rash.  Neurological: Negative for dizziness, syncope, speech difficulty, weakness, numbness and headaches.  Hematological: Negative for adenopathy. Does not bruise/bleed easily.  Psychiatric/Behavioral: Positive for dysphoric mood. Negative for behavioral problems and sleep disturbance. The patient is not nervous/anxious.     Past Medical History:  Diagnosis Date  . Acne   . Anemia    history of  . Anxiety   . Depression   . Diabetes mellitus    gest this pregnancy  . GERD (gastroesophageal reflux disease)    worse while pregnant  . Headache    Migraines  . Heart murmur    ? heart murmur in past  . History of blood transfusion 2013  . Hypertension      Social History   Socioeconomic History  . Marital status: Married    Spouse name: Not on file  . Number of children: 2  . Years of education: Not on file  . Highest education level: Not on file  Occupational History  . Not on file  Tobacco Use  . Smoking status: Never Smoker  . Smokeless tobacco: Never Used  Substance and Sexual  Activity  . Alcohol use: Yes    Comment: 1 glass of wine per week prior to preg  . Drug use: No  . Sexual activity: Yes  Other Topics Concern  . Not on file  Social History Narrative  . Not on file   Social Determinants of Health   Financial Resource Strain:   . Difficulty of Paying Living Expenses:   Food Insecurity:   . Worried About Charity fundraiser in the Last Year:   . Arboriculturist in the Last Year:   Transportation Needs:   . Film/video editor (Medical):   Marland Kitchen Lack of Transportation (Non-Medical):   Physical Activity:   . Days of Exercise per Week:   . Minutes of Exercise per Session:   Stress:   . Feeling of Stress :   Social Connections:   . Frequency of Communication with Friends and Family:   . Frequency of Social Gatherings with Friends and Family:   . Attends Religious Services:   . Active Member of Clubs or Organizations:   . Attends Archivist Meetings:   Marland Kitchen Marital Status:   Intimate Partner Violence:   . Fear of Current or Ex-Partner:   . Emotionally Abused:   Marland Kitchen Physically Abused:   . Sexually Abused:     Past Surgical History:  Procedure Laterality Date  . CESAREAN SECTION  x 2  . CESAREAN SECTION  09/29/2012   Procedure: CESAREAN SECTION;  Surgeon: Luz Lex, MD;  Location: [facility];  Service: Obstetrics;  Laterality: N/A;  Repeat edc 10/12/12/REQUEST;Chassity,Dee,Colleen  . COLONOSCOPY    . ESOPHAGOGASTRODUODENOSCOPY  normal    7/12  . LAPAROSCOPIC VAGINAL HYSTERECTOMY WITH SALPINGECTOMY Bilateral 02/23/2016   Procedure: LAPAROSCOPIC ASSISTED VAGINAL HYSTERECTOMY WITH bilateral SALPINGECTOMY, right oophorectomy. laporotic repair of incidental cystotomy.;  Surgeon: Louretta Shorten, MD;  Location: [facility];  Service: Gynecology;  Laterality: Bilateral;  . MOUTH SURGERY      Family History  Problem Relation Age of Onset  . Cancer Father        ? lung CA  . Kidney disease Father   . Depression Mother   . Hypertension Mother   .  Diabetes Mother   . Obesity Mother   . Thyroid disease Mother   . Hydrocephalus Sister   . Colon cancer Neg Hx     Allergies  Allergen Reactions  . Azithromycin Nausea And Vomiting    diarrhea    Current Outpatient Medications on File Prior to Visit  Medication Sig Dispense Refill  . ALPRAZolam (XANAX) 0.5 MG tablet alprazolam 0.5 mg tablet  TAKE 1 TABLET BY MOUTH THREE TIMES A DAY AS NEEDED FOR ANXIETY    . aspirin 81 MG tablet Take 81 mg by mouth daily.    . Diclofenac Sodium (VOLTAREN EX)     . hydrochlorothiazide (MICROZIDE) 12.5 MG capsule Take 1 capsule (12.5 mg total) by mouth daily. 90 capsule 1  . MELATONIN PO Take 2 mg by mouth daily.    . metFORMIN (GLUCOPHAGE) 500 MG tablet TAKE 1 TABLET (500 MG TOTAL) BY MOUTH 2 (TWO) TIMES DAILY WITH A MEAL. 180 tablet 1  . Turmeric 500 MG CAPS Take 1 capsule by mouth 2 (two) times daily.    Marland Kitchen VITAMIN D PO Take 1,000 Units by mouth 2 (two) times daily.    Marland Kitchen zolpidem (AMBIEN) 5 MG tablet Take 5 mg by mouth at bedtime as needed for sleep.     No current facility-administered medications on file prior to visit.    BP 131/77 (BP Location: Left Arm, Patient Position: Sitting, Cuff Size: Large)   Pulse 95   Temp (!) 97.3 F (36.3 C) (Temporal)   Resp 18   Ht 5\' 2"  (1.575 m)   Wt 180 lb (81.6 kg)   LMP 02/11/2016 (Exact Date)   SpO2 100%   BMI 32.92 kg/m       Objective:   Physical Exam  General Mental Status- Alert. General Appearance- Not in acute distress.   Skin General: Color- Normal Color. Moisture- Normal Moisture.  Neck Carotid Arteries- Normal color. Moisture- Normal Moisture. No carotid bruits. No JVD.  Chest and Lung Exam Auscultation: Breath Sounds:-Normal.  Cardiovascular Auscultation:Rythm- Regular. Murmurs & Other Heart Sounds:Auscultation of the heart reveals- No Murmurs.  Abdomen Inspection:-Inspeection Normal. Palpation/Percussion:Note:No mass. Palpation and Percussion of the abdomen reveal-  Non Tender, Non Distended + BS, no rebound or guarding.    Neurologic Cranial Nerve exam:- CN III-XII intact(No nystagmus), symmetric smile. Strength:- 5/5 equal and symmetric strength both upper and lower extremities.      Assessment & Plan:  For htn continue with hctz. Fairly good control. Check bp at home.   For metabolic syndrome and elevated sugar recheck a1c and continue metformin.  For high cholesterol recheck lipid panel.  For hip pain can use diclofenac. Note can increase blood pressure. So if  checking bp and above 140/90 can add arb.   For occasional muscle cramp add mg level.  For depressed mood continue wellbutrin. Let me know when you need refill.   Follow up date to be determined after lab review.  Time spent with patient today was 20  minutes which consisted of chart review, discussing diagnosis, work up treatment and documentation.  Mackie Pai, PA-C

## 2020-05-04 ENCOUNTER — Other Ambulatory Visit: Payer: Self-pay | Admitting: Medical

## 2020-06-11 DIAGNOSIS — Z1231 Encounter for screening mammogram for malignant neoplasm of breast: Secondary | ICD-10-CM | POA: Diagnosis not present

## 2020-06-27 DIAGNOSIS — L821 Other seborrheic keratosis: Secondary | ICD-10-CM | POA: Diagnosis not present

## 2020-06-27 DIAGNOSIS — L814 Other melanin hyperpigmentation: Secondary | ICD-10-CM | POA: Diagnosis not present

## 2020-06-27 DIAGNOSIS — L811 Chloasma: Secondary | ICD-10-CM | POA: Diagnosis not present

## 2020-06-27 DIAGNOSIS — L810 Postinflammatory hyperpigmentation: Secondary | ICD-10-CM | POA: Diagnosis not present

## 2020-08-08 NOTE — Progress Notes (Unsigned)
Error

## 2020-09-15 ENCOUNTER — Other Ambulatory Visit: Payer: Self-pay | Admitting: Medical

## 2020-09-22 ENCOUNTER — Telehealth: Payer: Self-pay

## 2020-09-22 ENCOUNTER — Emergency Department (HOSPITAL_BASED_OUTPATIENT_CLINIC_OR_DEPARTMENT_OTHER): Payer: BC Managed Care – PPO

## 2020-09-22 ENCOUNTER — Other Ambulatory Visit: Payer: Self-pay

## 2020-09-22 ENCOUNTER — Encounter: Payer: Self-pay | Admitting: Medical

## 2020-09-22 ENCOUNTER — Encounter (HOSPITAL_BASED_OUTPATIENT_CLINIC_OR_DEPARTMENT_OTHER): Payer: Self-pay | Admitting: Emergency Medicine

## 2020-09-22 ENCOUNTER — Emergency Department (HOSPITAL_BASED_OUTPATIENT_CLINIC_OR_DEPARTMENT_OTHER)
Admission: EM | Admit: 2020-09-22 | Discharge: 2020-09-22 | Disposition: A | Discharge status: Home / Self Care | Payer: BC Managed Care – PPO | Attending: Emergency Medicine | Admitting: Emergency Medicine

## 2020-09-22 DIAGNOSIS — Z7982 Long term (current) use of aspirin: Secondary | ICD-10-CM | POA: Insufficient documentation

## 2020-09-22 DIAGNOSIS — R202 Paresthesia of skin: Secondary | ICD-10-CM | POA: Diagnosis not present

## 2020-09-22 DIAGNOSIS — Z79899 Other long term (current) drug therapy: Secondary | ICD-10-CM | POA: Insufficient documentation

## 2020-09-22 DIAGNOSIS — R0789 Other chest pain: Secondary | ICD-10-CM | POA: Diagnosis not present

## 2020-09-22 DIAGNOSIS — R079 Chest pain, unspecified: Secondary | ICD-10-CM | POA: Diagnosis not present

## 2020-09-22 DIAGNOSIS — I10 Essential (primary) hypertension: Secondary | ICD-10-CM | POA: Diagnosis not present

## 2020-09-22 HISTORY — DX: Unspecified osteoarthritis, unspecified site: M19.90

## 2020-09-22 HISTORY — DX: Metabolic syndrome: E88.810

## 2020-09-22 HISTORY — DX: Metabolic syndrome: E88.81

## 2020-09-22 LAB — HEPATIC FUNCTION PANEL
ALT: 14 U/L (ref 0–44)
AST: 15 U/L (ref 15–41)
Albumin: 4 g/dL (ref 3.5–5.0)
Alkaline Phosphatase: 55 U/L (ref 38–126)
Bilirubin, Direct: 0.1 mg/dL (ref 0.0–0.2)
Indirect Bilirubin: 0.1 mg/dL — ABNORMAL LOW (ref 0.3–0.9)
Total Bilirubin: 0.2 mg/dL — ABNORMAL LOW (ref 0.3–1.2)
Total Protein: 7.3 g/dL (ref 6.5–8.1)

## 2020-09-22 LAB — BASIC METABOLIC PANEL
Anion gap: 11 (ref 5–15)
BUN: 7 mg/dL (ref 6–20)
CO2: 24 mmol/L (ref 22–32)
Calcium: 9.5 mg/dL (ref 8.9–10.3)
Chloride: 100 mmol/L (ref 98–111)
Creatinine, Ser: 0.8 mg/dL (ref 0.44–1.00)
GFR calc Af Amer: >60 mL/min (ref >60)
GFR calc non Af Amer: >60 mL/min (ref >60)
Glucose, Bld: 94 mg/dL (ref 70–99)
Potassium: 3.6 mmol/L (ref 3.5–5.1)
Sodium: 135 mmol/L (ref 135–145)

## 2020-09-22 LAB — CBC
HCT: 36.7 % (ref 36.0–46.0)
Hemoglobin: 12.1 g/dL (ref 12.0–15.0)
MCH: 29.3 pg (ref 26.0–34.0)
MCHC: 33 g/dL (ref 30.0–36.0)
MCV: 88.9 fL (ref 80.0–100.0)
Platelets: 448 10*3/uL — ABNORMAL HIGH (ref 150–400)
RBC: 4.13 MIL/uL (ref 3.87–5.11)
RDW: 13.5 % (ref 11.5–15.5)
WBC: 8.6 10*3/uL (ref 4.0–10.5)
nRBC: 0 % (ref 0.0–0.2)

## 2020-09-22 LAB — TROPONIN I (HIGH SENSITIVITY)
Troponin I (High Sensitivity): 3 ng/L (ref <18)
Troponin I (High Sensitivity): 2 ng/L (ref <18)

## 2020-09-22 LAB — LIPASE, BLOOD: Lipase: 30 U/L (ref 11–51)

## 2020-09-22 MED ORDER — LIDOCAINE VISCOUS HCL 2 % MT SOLN
15.0000 mL | Freq: Once | OROMUCOSAL | Status: AC
  Administered 2020-09-22: 15 mL via OROMUCOSAL
  Filled 2020-09-22: qty 15

## 2020-09-22 MED ORDER — ALUM & MAG HYDROXIDE-SIMETH 200-200-20 MG/5ML PO SUSP
15.0000 mL | Freq: Once | ORAL | Status: AC
  Administered 2020-09-22: 15 mL via ORAL
  Filled 2020-09-22: qty 30

## 2020-09-22 MED ORDER — OXYCODONE-ACETAMINOPHEN 5-325 MG PO TABS
1.0000 | ORAL_TABLET | Freq: Once | ORAL | Status: AC
  Administered 2020-09-22: 1 via ORAL
  Filled 2020-09-22: qty 1

## 2020-09-22 MED ORDER — KETOROLAC TROMETHAMINE 15 MG/ML IJ SOLN: 15.0000 mg | Freq: Once | INTRAMUSCULAR | Status: DC

## 2020-09-22 NOTE — Telephone Encounter (Signed)
Pt has an appt 9/28

## 2020-09-22 NOTE — ED Notes (Signed)
Patient transported to X-ray 

## 2020-09-22 NOTE — Telephone Encounter (Signed)
Transferred pt to Triage.  Pt stated she is having chest pains for the past 30 mins.

## 2020-09-22 NOTE — ED Provider Notes (Signed)
[facility] EMERGENCY DEPARTMENT Provider Note   CSN: [id number] Arrival date & time: 09/22/20  1356     History Chief Complaint  Patient presents with  . Chest Pain    Ann Frederick is a 49 y.o. female.  HPI 49 year old female the past medical history significant for hypertension, GERD who presents to the emergency department today for evaluation of chest pain.  Patient reports that approximately 1230 this afternoon she just got back 130-minute walk with her husband.  Patient reports after the walk she developed some pain to the left side of her chest that radiated to her left arm with some tingling to her left arm.  Patient describes the pain as a squeezing pressure-like sensation.  Patient reports the pain has been constant since then.  Patient rates the pain a 7/10 at this time.  No history of same pain.  Patient denies shortness breath, nausea, vomiting or diaphoresis.  She has taken an aspirin and Xanax for her symptoms prior to arrival.  Patient reports the pain also radiates to her left scapular region.  Patient does not have any cardiac history.  Denies any history of PE/DVT, prolonged immobilization, recent hospitalization/surgeries, unilateral leg swelling or calf tenderness.  Denies any tobacco use.  Denies any early family history of cardiac disease.  No alleviating or aggravating factors.  Of note patient does take daily Voltaren p.o. for hip pain.    Past Medical History:  Diagnosis Date  . Acne   . Anemia    history of  . Anxiety   . Arthritis   . Depression   . GERD (gastroesophageal reflux disease)    worse while pregnant  . Headache    Migraines  . Heart murmur    ? heart murmur in past  . History of blood transfusion 2013  . Hypertension   . Metabolic syndrome     Patient Active Problem List   Diagnosis Date Noted  . Left hip pain 08/27/2019  . Class 1 obesity due to excess calories without serious comorbidity with body mass index (BMI) of 34.0  to 34.9 in adult 11/28/2018  . Heart palpitations 11/28/2018  . OSA (obstructive sleep apnea) 09/14/2018  . Neck pain 04/26/2018  . Wellness examination 03/08/2016  . S/P laparoscopic assisted vaginal hysterectomy (LAVH) 02/23/2016  . Pink eye 06/19/2015  . Pain of right thumb 06/19/2015  . Sinusitis, acute frontal 02/05/2015  . Headache around the eyes 02/05/2015  . Back pain 11/01/2014  . Routine general medical examination at a health care facility 10/30/2013  . Vesicular rash 09/07/2013  . Sinusitis 02/02/2013  . Viral pharyngitis 03/03/2012  . Flank pain 08/13/2011  . Inguinal adenopathy 08/13/2011  . Dysphagia 04/20/2011  . GERD 01/29/2011  . FATIGUE 01/29/2011  . 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 LEG PAIN, BILATERAL 06/06/2010  . HIP PAIN, LEFT 04/01/2010  . MASS, LOCALIZED, SUPERFICIAL 03/05/2009  . ACNE VULGARIS 07/11/2007  . MURMUR 07/11/2007    Past Surgical History:  Procedure Laterality Date  . ANTERIOR FUSION CERVICAL SPINE  2018  . CESAREAN SECTION     x 2  . CESAREAN SECTION  09/29/2012   Procedure: CESAREAN SECTION;  Surgeon: Luz Lex, MD;  Location: [facility];  Service: Obstetrics;  Laterality: N/A;  Repeat edc 10/12/12/REQUEST;Chassity,Dee,Colleen  . COLONOSCOPY    . ESOPHAGOGASTRODUODENOSCOPY  normal    7/12  . JOINT REPLACEMENT    . LAPAROSCOPIC VAGINAL HYSTERECTOMY WITH SALPINGECTOMY Bilateral 02/23/2016   Procedure: LAPAROSCOPIC ASSISTED VAGINAL  HYSTERECTOMY WITH bilateral SALPINGECTOMY, right oophorectomy. laporotic repair of incidental cystotomy.;  Surgeon: Louretta Shorten, MD;  Location: [facility];  Service: Gynecology;  Laterality: Bilateral;  . MOUTH SURGERY       OB History    Gravida  8   Para  3   Term  3   Preterm  0   AB  5   Living  3     SAB  4   TAB  1   Ectopic  0   Multiple  0   Live Births  1           Family History  Problem Relation Age of Onset  . Cancer Father        ? lung CA  . Kidney disease Father   .  Depression Mother   . Hypertension Mother   . Diabetes Mother   . Obesity Mother   . Thyroid disease Mother   . Hydrocephalus Sister   . Colon cancer Neg Hx     Social History   Tobacco Use  . Smoking status: Never Smoker  . Smokeless tobacco: Never Used  Substance Use Topics  . Alcohol use: Yes    Comment: 1 glass of wine per week prior to preg  . Drug use: No    Home Medications Prior to Admission medications   Medication Sig Start Date End Date Taking? Authorizing Provider  ALPRAZolam (XANAX) 0.5 MG tablet alprazolam 0.5 mg tablet  TAKE 1 TABLET BY MOUTH THREE TIMES A DAY AS NEEDED FOR ANXIETY   Yes [provider]  aspirin 81 MG tablet Take 81 mg by mouth every other day.    Yes [provider]  Diclofenac Sodium (VOLTAREN EX)    Yes [provider]  hydrochlorothiazide (MICROZIDE) 12.5 MG capsule TAKE 1 CAPSULE BY MOUTH EVERY DAY 05/05/20  Yes Saguier, [name], PA-C  MELATONIN PO Take 2 mg by mouth daily.   Yes [provider]  metFORMIN (GLUCOPHAGE) 500 MG tablet TAKE 1 TABLET (500 MG TOTAL) BY MOUTH 2 (TWO) TIMES DAILY WITH A MEAL. 09/15/20  Yes Saguier, [name], PA-C  Turmeric 500 MG CAPS Take 1 capsule by mouth 2 (two) times daily.   Yes [provider]  VITAMIN D PO Take 1,000 Units by mouth 2 (two) times daily.   Yes [provider]  zolpidem (AMBIEN) 5 MG tablet Take 5 mg by mouth at bedtime as needed for sleep.   Yes [provider]    Allergies    Azithromycin  Review of Systems   Review of Systems  Constitutional: Negative for chills, diaphoresis and fever.  HENT: Negative for congestion.   Eyes: Negative for visual disturbance.  Respiratory: Negative for cough and shortness of breath.   Cardiovascular: Positive for chest pain. Negative for palpitations and leg swelling.  Gastrointestinal: Negative for abdominal pain, diarrhea, nausea and vomiting.  Genitourinary: Negative for difficulty urinating.   Musculoskeletal: Negative for arthralgias.  Skin: Negative for rash.  Neurological: Negative for headaches.  Psychiatric/Behavioral: Negative for confusion.    Physical Exam Updated Vital Signs BP 130/84   Pulse 81   Temp 98.4 F (36.9 C) (Oral)   Resp (!) 21   LMP 02/11/2016 (Exact Date)   SpO2 98%   Physical Exam Vitals and nursing note reviewed.  Constitutional:      General: She is not in acute distress.    Appearance: She is well-developed. She is not ill-appearing, toxic-appearing or diaphoretic.  HENT:  Head: Normocephalic and atraumatic.     Nose: Nose normal.  Eyes:     General:        Right eye: No discharge.        Left eye: No discharge.     Conjunctiva/sclera: Conjunctivae normal.     Pupils: Pupils are equal, round, and reactive to light.  Neck:     Vascular: No JVD.     Trachea: No tracheal deviation.  Cardiovascular:     Rate and Rhythm: Normal rate and regular rhythm.     Pulses:          Radial pulses are 2+ on the right side and 2+ on the left side.       Dorsalis pedis pulses are 2+ on the right side and 2+ on the left side.     Heart sounds: Normal heart sounds. No murmur heard.  No friction rub. No gallop.   Pulmonary:     Effort: Pulmonary effort is normal. No respiratory distress.     Breath sounds: Normal breath sounds.  Chest:     Chest wall: No tenderness.  Abdominal:     General: Bowel sounds are normal. There is no distension.     Palpations: Abdomen is soft.     Tenderness: There is abdominal tenderness (mild pain to the epigatric region). There is no guarding or rebound.  Musculoskeletal:        General: Normal range of motion.     Cervical back: Normal range of motion and neck supple.     Comments: No lower extremity edema or calf tenderness.  Lymphadenopathy:     Cervical: No cervical adenopathy.  Skin:    General: Skin is warm and dry.     Capillary Refill: Capillary refill takes less than 2 seconds.  Neurological:      Mental Status: She is alert and oriented to person, place, and time.     Motor: No weakness.  Psychiatric:        Mood and Affect: Mood normal.        Behavior: Behavior normal.        Thought Content: Thought content normal.        Judgment: Judgment normal.     ED Results / Procedures / Treatments   Labs (all labs ordered are listed, but only abnormal results are displayed) Labs Reviewed  CBC - Abnormal; Notable for the following components:      Result Value   Platelets 448 (*)    All other components within normal limits  HEPATIC FUNCTION PANEL - Abnormal; Notable for the following components:   Total Bilirubin 0.2 (*)    Indirect Bilirubin 0.1 (*)    All other components within normal limits  BASIC METABOLIC PANEL  LIPASE, BLOOD  TROPONIN I (HIGH SENSITIVITY)  TROPONIN I (HIGH SENSITIVITY)    EKG EKG Interpretation  Date/Time:  Monday September 22 2020 14:10:26 EDT Ventricular Rate:  89 PR Interval:  162 QRS Duration: 74 QT Interval:  348 QTC Calculation: 423 R Axis:   76 Text Interpretation: Normal sinus rhythm Normal ECG No STEMI Confirmed by Nanda Quinton [phone]) on 09/22/2020 3:02:08 PM   Radiology DG Chest 2 View  Result Date: 09/22/2020 CLINICAL DATA:  Chest pain for several hours EXAM: CHEST - 2 VIEW COMPARISON:  04/19/2018 FINDINGS: The heart size and mediastinal contours are within normal limits. Both lungs are clear. The visualized skeletal structures are unremarkable. Postsurgical changes are noted in the cervical spine IMPRESSION:  No active cardiopulmonary disease. Electronically Signed   By: Inez Catalina M.D.   On: 09/22/2020 14:40    Procedures Procedures (including critical care time)  Medications Ordered in ED Medications  lidocaine (XYLOCAINE) 2 % viscous mouth solution 15 mL (has no administration in time range)  alum & mag hydroxide-simeth (MAALOX/MYLANTA) 200-200-20 MG/5ML suspension 15 mL (has no administration in time range)    ED  Course  I have reviewed the triage vital signs and the nursing notes.  Pertinent labs & imaging results that were available during my care of the patient were reviewed by me and considered in my medical decision making (see chart for details).    MDM Rules/Calculators/A&P                          Pt presents to the Ed today with complaints of cp. Patient is to be discharged with recommendation to follow up with PCP in regards to today's hospital visit. Chest pain is not likely of cardiac or pulmonary etiology d/t presentation, perc negative, VSS, no tracheal deviation, no JVD or new murmur, RRR, breath sounds equal bilaterally, EKG wit nsr without any ischemic chanes, negative delta troponin, and negative CXR. Low risk heart score.  Pt has been advised to return to the ED is CP becomes exertional, associated with diaphoresis or nausea, radiates to left jaw/arm, worsens or becomes concerning in any way. Pt appears reliable for follow up and is agreeable to discharge.     Final Clinical Impression(s) / ED Diagnoses Final diagnoses:  Nonspecific chest pain    Rx / DC Orders ED Discharge Orders    None       Aaron Edelman 09/22/20 2025    Margette Fast, MD 09/23/20 [phone]

## 2020-09-22 NOTE — Discharge Instructions (Addendum)
Your work-up has been reassuring in the emergency department today.  I would recommend you follow-up with your cardiologist.  If you develop any worsening symptoms return to the ER.

## 2020-09-22 NOTE — ED Triage Notes (Signed)
States is having chest pain since 1230, after walking with husband, as per usual. Pain radiates to left arm and scapula area . Pain 6/10 currently, at worse was a 8/10 . Denies SOB, N/V or diaphoresis . Took ASA 81mg  and Xanax 0.5mg  PTA

## 2020-09-22 NOTE — ED Notes (Signed)
PT on monitor

## 2020-09-22 NOTE — ED Notes (Signed)
Lipase and Hepatic function added to blood in lab.

## 2020-09-22 NOTE — Telephone Encounter (Signed)
Pt in ED.  

## 2020-09-22 NOTE — Telephone Encounter (Signed)
Nurse Assessment Nurse: Ronnald Ramp, RN, Miranda Date/Time (Eastern Time): 09/22/2020 1:37:09 PM Confirm and document reason for call. If symptomatic, describe symptoms. ---Caller states she started having chest pain 45 min ago. Does the patient have any new or worsening symptoms? ---Yes Will a triage be completed? ---Yes Related visit to physician within the last 2 weeks? ---No Does the PT have any chronic conditions? (i.e. diabetes, asthma, this includes High risk factors for pregnancy, etc.) ---Yes List chronic conditions. ---HTN Is the patient pregnant or possibly pregnant? (Ask all females between the ages of 8-55) ---No Is this a behavioral health or substance abuse call? ---No Guidelines Guideline Title Affirmed Question Affirmed Notes Nurse Date/Time (Eastern Time) Chest Pain [1] Chest pain lasts > 5 minutes AND [2] age > 30 Ronnald Ramp, RN, Miranda 09/22/2020 1:37:31 PM Disp. Time Eilene Ghazi Time) Disposition Final User 09/22/2020 1:36:15 PM Send to Urgent Queue Ilona Sorrel 09/22/2020 1:40:01 PM 911 Outcome Documentation Ronnald Ramp, RN, Miranda Reason: Refused 911, she states she will have her husband drive her to the ED PLEASE NOTE: All timestamps contained within this report are represented as [place] Standard Time. CONFIDENTIALTY NOTICE: This fax transmission is intended only for the addressee. It contains information that is legally privileged, confidential or otherwise protected from use or disclosure. If you are not the intended recipient, you are strictly prohibited from reviewing, disclosing, copying using or disseminating any of this information or taking any action in reliance on or regarding this information. If you have received this fax in error, please notify us immediately by telephone so that we can arrange for its return to [place]. Phone: [phone], Toll-Free: [phone], Fax: [phone] Page: 2 of 2 Call Id: [id number] 09/22/2020 1:38:47 PM Call EMS 911 Now Yes Ronnald Ramp, RN,  Miranda Caller Disagree/Comply Comply Caller Understands Yes PreDisposition Call Doctor Care Advice Given Per Guideline CALL EMS 911 NOW: * Immediate medical attention is needed. You need to hang up and call 911 (or an ambulance). * Triager Discretion: I'll call you back in a few minutes to be sure you were able to reach them. CARE ADVICE given per Chest Pain (Adult) guideline. Referrals GO TO FACILITY OTHER - SPECIFY

## 2020-09-23 ENCOUNTER — Telehealth: Payer: Self-pay | Admitting: Medical

## 2020-09-23 ENCOUNTER — Ambulatory Visit (INDEPENDENT_AMBULATORY_CARE_PROVIDER_SITE_OTHER): Payer: BC Managed Care – PPO | Admitting: Medical

## 2020-09-23 ENCOUNTER — Other Ambulatory Visit: Payer: Self-pay

## 2020-09-23 VITALS — BP 111/58 | HR 89 | Resp 18 | Ht 62.0 in | Wt 178.0 lb

## 2020-09-23 DIAGNOSIS — K219 Gastro-esophageal reflux disease without esophagitis: Secondary | ICD-10-CM

## 2020-09-23 DIAGNOSIS — R8290 Unspecified abnormal findings in urine: Secondary | ICD-10-CM

## 2020-09-23 DIAGNOSIS — R0789 Other chest pain: Secondary | ICD-10-CM | POA: Diagnosis not present

## 2020-09-23 DIAGNOSIS — E785 Hyperlipidemia, unspecified: Secondary | ICD-10-CM | POA: Diagnosis not present

## 2020-09-23 DIAGNOSIS — Z23 Encounter for immunization: Secondary | ICD-10-CM | POA: Diagnosis not present

## 2020-09-23 DIAGNOSIS — F419 Anxiety disorder, unspecified: Secondary | ICD-10-CM

## 2020-09-23 DIAGNOSIS — R809 Proteinuria, unspecified: Secondary | ICD-10-CM

## 2020-09-23 DIAGNOSIS — R300 Dysuria: Secondary | ICD-10-CM | POA: Diagnosis not present

## 2020-09-23 LAB — POC URINALSYSI DIPSTICK (AUTOMATED)
Bilirubin, UA: 1
Blood, UA: NEGATIVE
Glucose, UA: NEGATIVE
Ketones, UA: POSITIVE
Leukocytes, UA: NEGATIVE
Nitrite, UA: POSITIVE
Protein, UA: POSITIVE — AB
Spec Grav, UA: >=1.03 — AB (ref 1.010–1.025)
Urobilinogen, UA: 0.2 E.U./dL
pH, UA: 5 (ref 5.0–8.0)

## 2020-09-23 LAB — TROPONIN I: Troponin I: <3 ng/L (ref <=47)

## 2020-09-23 MED ORDER — BUSPIRONE HCL 7.5 MG PO TABS: 7.5000 mg | ORAL_TABLET | Freq: Two times a day (BID) | ORAL | 0 refills | Status: DC

## 2020-09-23 MED ORDER — KETOROLAC TROMETHAMINE 30 MG/ML IJ SOLN
30.0000 mg | Freq: Once | INTRAMUSCULAR | Status: AC
  Administered 2020-09-23: 30 mg via INTRAMUSCULAR

## 2020-09-23 MED ORDER — OMEPRAZOLE 20 MG PO CPDR: 20.0000 mg | DELAYED_RELEASE_CAPSULE | Freq: Every day | ORAL | 3 refills | Status: DC

## 2020-09-23 MED ORDER — LOSARTAN POTASSIUM 25 MG PO TABS: 25.0000 mg | ORAL_TABLET | Freq: Every day | ORAL | 0 refills | Status: DC

## 2020-09-23 NOTE — Telephone Encounter (Signed)
Future urine and cmp placed.

## 2020-09-23 NOTE — Patient Instructions (Signed)
You do have recent atypical chest pain that appears to be noncardiac presently based on EKG yesterday and negative troponin.  Other differential diagnoses costochondritis versus GERD.  Since she has some residual pain presently we will go ahead and get second troponin today through our office.  Asking that that be done stat.  We gave you Toradol 30 mg IM today.  Want to see if your left side costochondral junction tenderness/reproducible to palpation resolves.  For history of GERD, did prescribe omeprazole 20 mg to take daily.  For anxiety, prescribed BuSpar 7.5 mg twice daily.  Your urine does look concentrated.  Recent kidney function in normal range.   For history of hyperlipidemia will get lipid panel.  With family history of CAD in both mom and aunt would recommend controlling risk factors.  I want to see how you respond to above treatment first then decide on timeframe of cardiologist referral.  Up in 1 month or as needed.

## 2020-09-23 NOTE — Progress Notes (Signed)
Subjective:    Patient ID: Ann Frederick, female    DOB: [birth year], 49 y.o.   MRN: [id number]  HPI  Pt in for follow up from the ED. Just yesterday. She had chest pain yesterday. She had cxr, ekg and enzymes. No hx of smoking. Mom had CAD deceased about one month. Hx of MI. But not clear why passed possible MI. She also had copd. Aunt had MI. Dad no cad or stroke. Pt not smoker.  Pt had gi cocktail and it helped some. They later gave percocet. The pain did eventually resolved.  Later at night about 1 and half after leaving ED the pain seemed to come back.  Pt pain max was 7. Then decreased to level 2. Pain went away late last night.   Now pain minimal at level 1/10.  Pt notes has voltaren every other day. States takes this way since has upset her stomach in the past.  Pt had ekg yesterday that showed normal sinus rhythm.  Troponin was negative.  Cxr was also negative.  Pt not on any proton pump inhibitor or h2 blocker.   Hx of mild tachycardia in the past. Pt did see cardiologist in 2018. No holter done back in.   Pt has mild concentrated urine recently.    Pt has some anxiety. She feels like it is worse recently. She has xanax that she could use if needed. Pt feels like it does not work. She is willing to try buspar rather than xanax.   Review of Systems  Constitutional: Negative for chills and fatigue.  HENT: Negative for congestion and ear discharge.   Respiratory: Negative for cough, chest tightness, shortness of breath and wheezing.   Cardiovascular: Negative for chest pain and palpitations.  Gastrointestinal: Negative for abdominal pain, constipation, diarrhea and vomiting.  Genitourinary: Negative for difficulty urinating, dyspareunia, dysuria, flank pain, frequency, hematuria, urgency and vaginal pain.       Odor to urine.  Musculoskeletal: Negative for back pain and neck pain.  Skin: Negative for rash.  Neurological: Negative for dizziness and headaches.    Psychiatric/Behavioral: Negative for behavioral problems and confusion.    Past Medical History:  Diagnosis Date  . Acne   . Anemia    history of  . Anxiety   . Arthritis   . Depression   . GERD (gastroesophageal reflux disease)    worse while pregnant  . Headache    Migraines  . Heart murmur    ? heart murmur in past  . History of blood transfusion 2013  . Hypertension   . Metabolic syndrome      Social History   Socioeconomic History  . Marital status: Married    Spouse name: Not on file  . Number of children: 2  . Years of education: Not on file  . Highest education level: Not on file  Occupational History  . Not on file  Tobacco Use  . Smoking status: Never Smoker  . Smokeless tobacco: Never Used  Substance and Sexual Activity  . Alcohol use: Yes    Comment: 1 glass of wine per week prior to preg  . Drug use: No  . Sexual activity: Yes  Other Topics Concern  . Not on file  Social History Narrative  . Not on file   Social Determinants of Health   Financial Resource Strain:   . Difficulty of Paying Living Expenses: Not on file  Food Insecurity:   . Worried About Charity fundraiser  in the Last Year: Not on file  . Ran Out of Food in the Last Year: Not on file  Transportation Needs:   . Lack of Transportation (Medical): Not on file  . Lack of Transportation (Non-Medical): Not on file  Physical Activity:   . Days of Exercise per Week: Not on file  . Minutes of Exercise per Session: Not on file  Stress:   . Feeling of Stress : Not on file  Social Connections:   . Frequency of Communication with Friends and Family: Not on file  . Frequency of Social Gatherings with Friends and Family: Not on file  . Attends Religious Services: Not on file  . Active Member of Clubs or Organizations: Not on file  . Attends Archivist Meetings: Not on file  . Marital Status: Not on file  Intimate Partner Violence:   . Fear of Current or Ex-Partner: Not on  file  . Emotionally Abused: Not on file  . Physically Abused: Not on file  . Sexually Abused: Not on file    Past Surgical History:  Procedure Laterality Date  . ANTERIOR FUSION CERVICAL SPINE  2018  . CESAREAN SECTION     x 2  . CESAREAN SECTION  09/29/2012   Procedure: CESAREAN SECTION;  Surgeon: Luz Lex, MD;  Location: [facility];  Service: Obstetrics;  Laterality: N/A;  Repeat edc 10/12/12/REQUEST;Chassity,Dee,Colleen  . COLONOSCOPY    . ESOPHAGOGASTRODUODENOSCOPY  normal    7/12  . JOINT REPLACEMENT    . LAPAROSCOPIC VAGINAL HYSTERECTOMY WITH SALPINGECTOMY Bilateral 02/23/2016   Procedure: LAPAROSCOPIC ASSISTED VAGINAL HYSTERECTOMY WITH bilateral SALPINGECTOMY, right oophorectomy. laporotic repair of incidental cystotomy.;  Surgeon: Louretta Shorten, MD;  Location: [facility];  Service: Gynecology;  Laterality: Bilateral;  . MOUTH SURGERY      Family History  Problem Relation Age of Onset  . Cancer Father        ? lung CA  . Kidney disease Father   . Depression Mother   . Hypertension Mother   . Diabetes Mother   . Obesity Mother   . Thyroid disease Mother   . Hydrocephalus Sister   . Colon cancer Neg Hx     Allergies  Allergen Reactions  . Azithromycin Nausea And Vomiting    diarrhea    Current Outpatient Medications on File Prior to Visit  Medication Sig Dispense Refill  . ALPRAZolam (XANAX) 0.5 MG tablet alprazolam 0.5 mg tablet  TAKE 1 TABLET BY MOUTH THREE TIMES A DAY AS NEEDED FOR ANXIETY    . aspirin 81 MG tablet Take 81 mg by mouth every other day.     Marland Kitchen buPROPion (WELLBUTRIN XL) 300 MG 24 hr tablet Take 300 mg by mouth every morning.    . Diclofenac Sodium (VOLTAREN EX)     . hydrochlorothiazide (MICROZIDE) 12.5 MG capsule TAKE 1 CAPSULE BY MOUTH EVERY DAY 90 capsule 1  . MELATONIN PO Take 2 mg by mouth daily.    . metFORMIN (GLUCOPHAGE) 500 MG tablet TAKE 1 TABLET (500 MG TOTAL) BY MOUTH 2 (TWO) TIMES DAILY WITH A MEAL. 180 tablet 1  . Turmeric 500 MG CAPS  Take 1 capsule by mouth 2 (two) times daily.    Marland Kitchen VITAMIN D PO Take 1,000 Units by mouth 2 (two) times daily.    Marland Kitchen zolpidem (AMBIEN) 5 MG tablet Take 5 mg by mouth at bedtime as needed for sleep.     No current facility-administered medications on file prior to visit.  BP (!) 111/58   Pulse 89   Resp 18   Ht 5\' 2"  (1.575 m)   Wt 178 lb (80.7 kg)   LMP 02/11/2016 (Exact Date)   SpO2 99%   BMI 32.56 kg/m       Objective:   Physical Exam  General Mental Status- Alert. General Appearance- Not in acute distress.   Skin General: Color- Normal Color. Moisture- Normal Moisture.  Neck Carotid Arteries- Normal color. Moisture- Normal Moisture. No carotid bruits. No JVD.  Chest and Lung Exam Auscultation: Breath Sounds:-Normal.  Cardiovascular Auscultation:Rythm- Regular. Murmurs & Other Heart Sounds:Auscultation of the heart reveals- No Murmurs.  Abdomen Inspection:-Inspeection Normal. Palpation/Percussion:Note:No mass. Palpation and Percussion of the abdomen reveal- Non Tender, Non Distended + BS, no rebound or guarding.    Neurologic Cranial Nerve exam:- CN III-XII intact(No nystagmus), symmetric smile. Strength:- 5/5 equal and symmetric strength both upper and lower extremities.   Anterior thorax- left side mild costochondral junction tenderness to palpation.     Assessment & Plan:  [phone].  You do have recent atypical chest pain that appears to be noncardiac presently based on EKG yesterday and negative troponin.  Other differential diagnoses costochondritis versus GERD.  Since she has some residual pain presently we will go ahead and get second troponin today through our office.  Asking that that be done stat.  We gave you Toradol 30 mg IM today.  Want to see if your left side costochondral junction tenderness/reproducible to palpation resolves.  For history of GERD, did prescribe omeprazole 20 mg to take daily.  For anxiety, prescribed BuSpar 7.5 mg  twice daily.  Your urine does look concentrated.  Recent kidney function in normal range.   For history of hyperlipidemia will get lipid panel.  With family history of CAD in both mom and aunt would recommend controlling risk factors.  I want to see how you respond to above treatment first then decide on timeframe of cardiologist referral.  Up in 1 month or as needed.  Mackie Pai, PA-C   Time spent with patient today was 40  minutes which consisted of chart review/ED visit, discussing diagnoses, work up, treatment and documentation.

## 2020-09-24 ENCOUNTER — Encounter: Payer: Self-pay | Admitting: Medical

## 2020-09-24 LAB — LIPID PANEL
Cholesterol: 226 mg/dL — ABNORMAL HIGH (ref <200)
HDL: 42 mg/dL — ABNORMAL LOW (ref >=50)
LDL Cholesterol (Calc): 147 mg/dL (calc) — ABNORMAL HIGH
Non-HDL Cholesterol (Calc): 184 mg/dL (calc) — ABNORMAL HIGH (ref <130)
Total CHOL/HDL Ratio: 5.4 (calc) — ABNORMAL HIGH (ref <5.0)
Triglycerides: 220 mg/dL — ABNORMAL HIGH (ref <150)

## 2020-09-25 ENCOUNTER — Telehealth: Payer: Self-pay | Admitting: Medical

## 2020-09-25 DIAGNOSIS — R0789 Other chest pain: Secondary | ICD-10-CM

## 2020-09-25 LAB — URINE CULTURE
MICRO NUMBER:: 11004715
SPECIMEN QUALITY:: ADEQUATE

## 2020-09-25 MED ORDER — AMOXICILLIN-POT CLAVULANATE 875-125 MG PO TABS: 1.0000 | ORAL_TABLET | Freq: Two times a day (BID) | ORAL | 0 refills | Status: DC

## 2020-09-25 NOTE — Telephone Encounter (Signed)
Rx augmentin sent to pharmacy.

## 2020-09-25 NOTE — Telephone Encounter (Signed)
Referral to cardiologist placed. 

## 2020-10-02 ENCOUNTER — Other Ambulatory Visit: Payer: Self-pay

## 2020-10-02 ENCOUNTER — Ambulatory Visit (INDEPENDENT_AMBULATORY_CARE_PROVIDER_SITE_OTHER): Payer: BC Managed Care – PPO | Admitting: Cardiology

## 2020-10-02 ENCOUNTER — Encounter: Payer: Self-pay | Admitting: Cardiology

## 2020-10-02 VITALS — BP 143/90 | HR 91 | Ht 62.0 in | Wt 181.4 lb

## 2020-10-02 DIAGNOSIS — I10 Essential (primary) hypertension: Secondary | ICD-10-CM

## 2020-10-02 DIAGNOSIS — Z7189 Other specified counseling: Secondary | ICD-10-CM | POA: Diagnosis not present

## 2020-10-02 DIAGNOSIS — R079 Chest pain, unspecified: Secondary | ICD-10-CM

## 2020-10-02 NOTE — Patient Instructions (Addendum)
Medication Instructions:  Restart Losartan 25 mg daily   *If you need a refill on your cardiac medications before your next appointment, please call your pharmacy*   Lab Work: None ordered    Testing/Procedures: None ordered    Follow-Up: At [facility], you and your health needs are our priority.  As part of our continuing mission to provide you with exceptional heart care, we have created designated Provider Care Teams.  These Care Teams include your primary Cardiologist (physician) and Advanced Practice Providers (APPs -  Physician Assistants and Nurse Practitioners) who all work together to provide you with the care you need, when you need it.  We recommend signing up for the patient portal called "MyChart".  Sign up information is provided on this After Visit Summary.  MyChart is used to connect with patients for Virtual Visits (Telemedicine).  Patients are able to view lab/test results, encounter notes, upcoming appointments, etc.  Non-urgent messages can be sent to your provider as well.   To learn more about what you can do with MyChart, go to [email or web address].    Your next appointment:   2 year(s)  The format for your next appointment:   In Person  Provider:   Buford Dresser, MD

## 2020-10-02 NOTE — Progress Notes (Signed)
Cardiology Office Note:    Date:  10/02/2020   ID:  Ann Frederick, DOB 08-28-1971, MRN [id number]  PCP:  Mackie Pai, PA-C  Cardiologist:  Buford Dresser, MD PhD  Referring MD: Mackie Pai, PA-C   CC: follow up  History of Present Illness:    Ann Frederick is a 49 y.o. female with a hx of hypertension, diabetes who is seen in follow up. She was first seen on 08/31/18 for tachycardia and palpitations  Cardiac history: first noted ~2018 that her heart rate would go to 150 bpm when exercising. Resting heart rate in the 80s usually, but was up to around 100 bpm resting early 2019. Had a cervical fusion summer 2019 and was less active around that time.   Today: Went to ER 09/22/20 with chest pain. Notes reviewed. Saw Mackie Pai 09/23/20, recommended to follow up with cardiology.  Her pain only slightly improved with GI cocktail and pain medications in the ER. Most improvement was with toradol shot the following day in her PCP office. Noted that she had some recurrence as toradol has worn off.  Pain is not sharp, but not a pressure. Bothersome, right sided and left sided, moves to her back as well. Nothing other than medications makes it better or worse. Feels like if she could press deep enough she could make it feel better. No other symptoms with this. Toradol helped the most.  Blood pressure slightly elevated, ordered for losartan but didn't take. Checks her BP at home, typically 732K-025K systolic. She is off this week, has been more relaxed, but noted that BP has gone up. Did have a headache at the time. Restarted HCTZ 12.5 mg at home two days ago. Has not started losartan. We discussed at length today, she will stop HCTZ and start losartan. Has follow up with her PCP in several weeks.  Mother just passed away, was on hospice. Had a history of CAD. Aunt with a stroke. Both were smokers. No premature CAD. Discussed ASCVD risk, statins, etc at length today.   Past Medical  History:  Diagnosis Date   Acne    Anemia    history of   Anxiety    Arthritis    Depression    GERD (gastroesophageal reflux disease)    worse while pregnant   Headache    Migraines   Heart murmur    ? heart murmur in past   History of blood transfusion 2013   Hypertension    Metabolic syndrome     Past Surgical History:  Procedure Laterality Date   ANTERIOR FUSION CERVICAL SPINE  2018   CESAREAN SECTION     x 2   CESAREAN SECTION  09/29/2012   Procedure: CESAREAN SECTION;  Surgeon: Luz Lex, MD;  Location: [facility];  Service: Obstetrics;  Laterality: N/A;  Repeat edc 10/12/12/REQUEST;Chassity,Dee,Colleen   COLONOSCOPY     ESOPHAGOGASTRODUODENOSCOPY  normal    7/12   JOINT REPLACEMENT     LAPAROSCOPIC VAGINAL HYSTERECTOMY WITH SALPINGECTOMY Bilateral 02/23/2016   Procedure: LAPAROSCOPIC ASSISTED VAGINAL HYSTERECTOMY WITH bilateral SALPINGECTOMY, right oophorectomy. laporotic repair of incidental cystotomy.;  Surgeon: Louretta Shorten, MD;  Location: [facility];  Service: Gynecology;  Laterality: Bilateral;   MOUTH SURGERY      Current Medications: Current Outpatient Medications on File Prior to Visit  Medication Sig   ALPRAZolam (XANAX) 0.5 MG tablet alprazolam 0.5 mg tablet  TAKE 1 TABLET BY MOUTH THREE TIMES A DAY AS NEEDED FOR ANXIETY   aspirin  81 MG tablet Take 81 mg by mouth every other day.    buPROPion (WELLBUTRIN XL) 300 MG 24 hr tablet Take 300 mg by mouth every morning.   busPIRone (BUSPAR) 7.5 MG tablet Take 1 tablet (7.5 mg total) by mouth 2 (two) times daily.   Diclofenac Sodium (VOLTAREN EX)    ezetimibe (ZETIA) 10 MG tablet Take 10 mg by mouth daily.   losartan (COZAAR) 25 MG tablet Take 25 mg by mouth daily.   MELATONIN PO Take 2 mg by mouth daily.   metFORMIN (GLUCOPHAGE) 500 MG tablet TAKE 1 TABLET (500 MG TOTAL) BY MOUTH 2 (TWO) TIMES DAILY WITH A MEAL.   omeprazole (PRILOSEC) 20 MG capsule Take 1 capsule (20 mg total) by mouth  daily.   Turmeric 500 MG CAPS Take 1 capsule by mouth 2 (two) times daily.   VITAMIN D PO Take 1,000 Units by mouth 2 (two) times daily.   zolpidem (AMBIEN) 5 MG tablet Take 5 mg by mouth at bedtime as needed for sleep.   No current facility-administered medications on file prior to visit.     Allergies:   Azithromycin   Social History   Tobacco Use   Smoking status: Never Smoker   Smokeless tobacco: Never Used  Substance Use Topics   Alcohol use: Yes    Comment: 1 glass of wine per week prior to preg   Drug use: No   Background in nursing, very high health literacy.   Family History: The patient's family history includes Cancer in her father; Depression in her mother; Diabetes in her mother; Hydrocephalus in her sister; Hypertension in her mother; Kidney disease in her father; Obesity in her mother; Thyroid disease in her mother. There is no history of Colon cancer. Mom has heart disease in her late 55s, had a heart cath and stent last year, has diabetes and hypertension. No early CAD.  ROS:   Please see the history of present illness.  Additional pertinent ROS otherwise unremarkable  EKGs/Labs/Other Studies Reviewed:    The following studies were reviewed today: No prior cardiac studies  EKG:  EKG is personally reviewed today.  The ekg ordered previously demonstrates NSR at 91 bpm  Recent Labs: 04/29/2020: Magnesium 2.0 09/22/2020: ALT 14; BUN 7; Creatinine, Ser 0.80; Hemoglobin 12.1; Platelets 448; Potassium 3.6; Sodium 135  Recent Lipid Panel    Component Value Date/Time   CHOL 226 (H) 09/23/2020 0917   TRIG 220 (H) 09/23/2020 0917   HDL 42 (L) 09/23/2020 0917   CHOLHDL 5.4 (H) 09/23/2020 0917   VLDL 46.2 (H) 04/29/2020 0836   LDLCALC 147 (H) 09/23/2020 0917   LDLDIRECT 96.0 04/29/2020 0836    Physical Exam:    VS:  BP (!) 143/90    Pulse 91    Ht 5\' 2"  (1.575 m)    Wt 181 lb 6.4 oz (82.3 kg)    LMP 02/11/2016 (Exact Date)    SpO2 97%    BMI 33.18 kg/m       Wt Readings from Last 3 Encounters:  10/02/20 181 lb 6.4 oz (82.3 kg)  09/23/20 178 lb (80.7 kg)  04/29/20 180 lb (81.6 kg)    GEN: Well nourished, well developed in no acute distress HEENT: Normal, moist mucous membranes NECK: No JVD CARDIAC: regular rhythm, normal S1 and S2, no rubs or gallops. No murmur. VASCULAR: Radial and DP pulses 2+ bilaterally. No carotid bruits RESPIRATORY:  Clear to auscultation without rales, wheezing or rhonchi  ABDOMEN: Soft, non-tender,  non-distended MUSCULOSKELETAL:  Ambulates independently SKIN: Warm and dry, no edema NEUROLOGIC:  Alert and oriented x 3. No focal neuro deficits noted. PSYCHIATRIC:  Normal affect   ASSESSMENT:    1. Chest pain, unspecified type   2. Essential hypertension   3. Counseling on health promotion and disease prevention   4. Cardiac risk counseling    PLAN:    Chest pain: atypical components, normal hsTn, improved with antiinflammatories -unlikely to be cardiac in etiology at this time -counseled on red flag warning signs that need immediate medical attention  Palpitations, sinus tachycardia: improved  Hypertension:  -elevated today. She restarted HCTZ 12.5 mg about two days ago.  -after discussion, she will stop HCTZ, start the losartan 25 mg she was previously prescribed -has follow up with her PCP already scheduled in several weeks.  CV risk counseling and primary prevention:  -we reviewed her risk, recommendations -we discussed the data for statins vs zetia. She has been on statins previously -Her ASCVD risk is very low, already on aspirin 81 mg per her choice. No indication for preventative medication at this time. If her risk score increases, or if she develops diabetes in the future, would recommend a statin over baby aspirin for prevention. -recommend heart healthy/Mediterranean diet, with whole grains, fruits, vegetable, fish, lean meats, nuts, and olive oil. Limit salt. -recommend moderate walking, 3-5  times/week for 30-50 minutes each session. Aim for at least 150 minutes.week. Goal should be pace of 3 miles/hours, or walking 1.5 miles in 30 minutes -recommend avoidance of tobacco products. Avoid excess alcohol. -Additional risk factor control:  -Diabetes: A1c is 5.2, prior GLP1RA is for weight loss and not diabetes  -OSA: using CPAP -ASCVD risk score: The 10-year ASCVD risk score Mikey Bussing DC Brooke Bonito., et al., 2013) is: 3%   Values used to calculate the score:     Age: 67 years     Sex: Female     Is Non-Hispanic African American: No     Diabetic: No     Tobacco smoker: No     Systolic Blood Pressure: 983 mmHg     Is BP treated: Yes     HDL Cholesterol: 42 mg/dL     Total Cholesterol: 226 mg/dL  Plan for follow up: 2 years or sooner PRN  Total time of encounter: 45 minutes total time of encounter, including 32 minutes spent in face-to-face patient care. This time includes coordination of care and counseling regarding extensive education on blood pressure, lipids, statin, ARBs, and risk. Remainder of non-face-to-face time involved reviewing chart documents/testing relevant to the patient encounter and documentation in the medical record.  Buford Dresser, MD, PhD [place]   [facility]   Medication Adjustments/Labs and Tests Ordered: Current medicines are reviewed at length with the patient today.  Concerns regarding medicines are outlined above.  Orders Placed This Encounter  Procedures   EKG 12-Lead   No orders of the defined types were placed in this encounter.   Patient Instructions  Medication Instructions:  Restart Losartan 25 mg daily   *If you need a refill on your cardiac medications before your next appointment, please call your pharmacy*   Lab Work: None ordered    Testing/Procedures: None ordered    Follow-Up: At [facility], you and your health needs are our priority.  As part of our continuing mission to provide you with exceptional heart  care, we have created designated Provider Care Teams.  These Care Teams include your primary Cardiologist (physician) and  Advanced Practice Providers (APPs -  Physician Assistants and Nurse Practitioners) who all work together to provide you with the care you need, when you need it.  We recommend signing up for the patient portal called "MyChart".  Sign up information is provided on this After Visit Summary.  MyChart is used to connect with patients for Virtual Visits (Telemedicine).  Patients are able to view lab/test results, encounter notes, upcoming appointments, etc.  Non-urgent messages can be sent to your provider as well.   To learn more about what you can do with MyChart, go to [email or web address].    Your next appointment:   2 year(s)  The format for your next appointment:   In Person  Provider:   Buford Dresser, MD       Signed, Buford Dresser, MD PhD 10/02/2020 12:15 PM    [place]

## 2020-10-13 ENCOUNTER — Ambulatory Visit (INDEPENDENT_AMBULATORY_CARE_PROVIDER_SITE_OTHER): Payer: BC Managed Care – PPO | Admitting: Medical

## 2020-10-13 ENCOUNTER — Other Ambulatory Visit: Payer: Self-pay

## 2020-10-13 VITALS — BP 137/79 | HR 80 | Resp 18 | Ht 62.0 in | Wt 175.0 lb

## 2020-10-13 DIAGNOSIS — M549 Dorsalgia, unspecified: Secondary | ICD-10-CM | POA: Diagnosis not present

## 2020-10-13 DIAGNOSIS — E6609 Other obesity due to excess calories: Secondary | ICD-10-CM | POA: Diagnosis not present

## 2020-10-13 DIAGNOSIS — M545 Low back pain, unspecified: Secondary | ICD-10-CM | POA: Diagnosis not present

## 2020-10-13 DIAGNOSIS — I10 Essential (primary) hypertension: Secondary | ICD-10-CM

## 2020-10-13 DIAGNOSIS — Z6834 Body mass index (BMI) 34.0-34.9, adult: Secondary | ICD-10-CM

## 2020-10-13 LAB — POC URINALSYSI DIPSTICK (AUTOMATED)
Bilirubin, UA: NEGATIVE
Blood, UA: NEGATIVE
Glucose, UA: NEGATIVE
Ketones, UA: NEGATIVE
Leukocytes, UA: NEGATIVE
Nitrite, UA: NEGATIVE
Protein, UA: NEGATIVE
Spec Grav, UA: 1.01 (ref 1.010–1.025)
Urobilinogen, UA: 0.2 E.U./dL
pH, UA: 5.5 (ref 5.0–8.0)

## 2020-10-13 MED ORDER — CHLORTHALIDONE 25 MG PO TABS: 25.0000 mg | ORAL_TABLET | Freq: Every day | ORAL | 0 refills | Status: DC

## 2020-10-13 MED ORDER — SAXENDA 18 MG/3ML ~~LOC~~ SOPN: PEN_INJECTOR | SUBCUTANEOUS | 0 refills | Status: DC

## 2020-10-13 NOTE — Patient Instructions (Signed)
Your BP was fairly elevated this weekend particularly back to 163/105 reading.  Blood pressure much better now that you have been on HCTZ 12.5 mg twice daily.  Think chlorthalidone 25 mg would be better option.  Sent that to your pharmacy.  Please get metabolic panel today.  Continue check blood pressure daily.  For CVA region tenderness and history of UTI, will get urinalysis today and will send urine culture as last time he did have UTI.  Back pain as well recently and some of recent history as well as paralumbar region pain may consider muscle pain in differential.  You do have known findings on MRI back in 2020.  Notify us if your back pain worsens or you have radicular pain.  Presently recommend Tylenol and you could try Salonpas lidocaine patch.  For obesity and desired weight loss, I did give you print prescription of Saxenda.  Hopefully that will be recently covered.  Also discussed possibility of referral to weight loss management clinic.  Follow-up in 2 weeks or as needed.

## 2020-10-13 NOTE — Progress Notes (Signed)
Subjective:    Patient ID: Ann Frederick, female    DOB: 1971/08/30, 49 y.o.   MRN: [id number]  HPI  Pt in for follow up.  Pt states this weekend she had some pedal edema. Also had mild ha.  Pt has been on losartan. Pt thinks felt bloated. bp 140/95 range and up to 163/105 the other day as well. See cardiologist note about 2 weeks ago. Recommended losartan 25 mg daily.   On Saturday headache about 4-5/10.   Pt stopped taking the losartan. I started her 25 mg daily. She stopped that recently.   She started back on hctz 3 days ago. She is taking hctz 12. 5 mg twice a day. Pt in past never got muscle cramps. Pt felt like she may not have hydrated adquatley in the past when she was on. She is ok with staying on it in the future.  Pt had some back pain this weekend. No radiating pain to legs. No flank pain. Did some mild boxing of items this weekend.  Husband did most of heavy lifting.  IMPRESSION: 1. L5-S1 small central disc protrusion which contacts the right S1 nerve root. 2. Small noncompressive disc protrusions at T12-L1 and L2-3. 3. Mild facet spurring at L2-3 and L3-4. 4. T12 is non rib-bearing   Pain level 3/10 in back. Pt can has tried tylenol for back. Tylenol did not help much.   Pt does want to lose weight. She has been wanting to loose weight.  Sig: Inject 0.6 mg into the skin daily. 0.6 mg daily on week 1, 1.2 milligrams daily week 2. 1.8 milligrams daily week 3, 2.4 mg daily on week 4, 3.0 mg daily on week 5 and thereafter.  Review of Systems  Constitutional: Negative for chills, fatigue and fever.  Respiratory: Negative for cough, chest tightness, shortness of breath and wheezing.   Cardiovascular: Negative for chest pain and palpitations.  Gastrointestinal: Negative for abdominal pain.  Musculoskeletal: Positive for back pain.  Hematological: Negative for adenopathy. Does not bruise/bleed easily.  Psychiatric/Behavioral: Negative for behavioral problems.     Past Medical History:  Diagnosis Date   Acne    Anemia    history of   Anxiety    Arthritis    Depression    GERD (gastroesophageal reflux disease)    worse while pregnant   Headache    Migraines   Heart murmur    ? heart murmur in past   History of blood transfusion 2013   Hypertension    Metabolic syndrome      Social History   Socioeconomic History   Marital status: Married    Spouse name: Not on file   Number of children: 2   Years of education: Not on file   Highest education level: Not on file  Occupational History   Not on file  Tobacco Use   Smoking status: Never Smoker   Smokeless tobacco: Never Used  Substance and Sexual Activity   Alcohol use: Yes    Comment: 1 glass of wine per week prior to preg   Drug use: No   Sexual activity: Yes  Other Topics Concern   Not on file  Social History Narrative   Not on file   Social Determinants of Health   Financial Resource Strain:    Difficulty of Paying Living Expenses: Not on file  Food Insecurity:    Worried About [place] in the Last Year: Not on file   Ran  Out of Food in the Last Year: Not on file  Transportation Needs:    Lack of Transportation (Medical): Not on file   Lack of Transportation (Non-Medical): Not on file  Physical Activity:    Days of Exercise per Week: Not on file   Minutes of Exercise per Session: Not on file  Stress:    Feeling of Stress : Not on file  Social Connections:    Frequency of Communication with Friends and Family: Not on file   Frequency of Social Gatherings with Friends and Family: Not on file   Attends Religious Services: Not on file   Active Member of Clubs or Organizations: Not on file   Attends Archivist Meetings: Not on file   Marital Status: Not on file  Intimate Partner Violence:    Fear of Current or Ex-Partner: Not on file   Emotionally Abused: Not on file   Physically Abused: Not on file    Sexually Abused: Not on file    Past Surgical History:  Procedure Laterality Date   ANTERIOR FUSION CERVICAL SPINE  2018   CESAREAN SECTION     x 2   CESAREAN SECTION  09/29/2012   Procedure: CESAREAN SECTION;  Surgeon: Luz Lex, MD;  Location: [facility];  Service: Obstetrics;  Laterality: N/A;  Repeat edc 10/12/12/REQUEST;Chassity,Dee,Colleen   COLONOSCOPY     ESOPHAGOGASTRODUODENOSCOPY  normal    7/12   JOINT REPLACEMENT     LAPAROSCOPIC VAGINAL HYSTERECTOMY WITH SALPINGECTOMY Bilateral 02/23/2016   Procedure: LAPAROSCOPIC ASSISTED VAGINAL HYSTERECTOMY WITH bilateral SALPINGECTOMY, right oophorectomy. laporotic repair of incidental cystotomy.;  Surgeon: Louretta Shorten, MD;  Location: [facility];  Service: Gynecology;  Laterality: Bilateral;   MOUTH SURGERY      Family History  Problem Relation Age of Onset   Cancer Father        ? lung CA   Kidney disease Father    Depression Mother    Hypertension Mother    Diabetes Mother    Obesity Mother    Thyroid disease Mother    Hydrocephalus Sister    Colon cancer Neg Hx     Allergies  Allergen Reactions   Azithromycin Nausea And Vomiting    diarrhea    Current Outpatient Medications on File Prior to Visit  Medication Sig Dispense Refill   ALPRAZolam (XANAX) 0.5 MG tablet alprazolam 0.5 mg tablet  TAKE 1 TABLET BY MOUTH THREE TIMES A DAY AS NEEDED FOR ANXIETY     aspirin 81 MG tablet Take 81 mg by mouth every other day.      buPROPion (WELLBUTRIN XL) 300 MG 24 hr tablet Take 300 mg by mouth every morning.     busPIRone (BUSPAR) 7.5 MG tablet Take 1 tablet (7.5 mg total) by mouth 2 (two) times daily. 60 tablet 0   Diclofenac Sodium (VOLTAREN EX)      ezetimibe (ZETIA) 10 MG tablet Take 10 mg by mouth daily.     losartan (COZAAR) 25 MG tablet Take 25 mg by mouth daily.     MELATONIN PO Take 2 mg by mouth daily.     metFORMIN (GLUCOPHAGE) 500 MG tablet TAKE 1 TABLET (500 MG TOTAL) BY MOUTH 2 (TWO) TIMES  DAILY WITH A MEAL. 180 tablet 1   omeprazole (PRILOSEC) 20 MG capsule Take 1 capsule (20 mg total) by mouth daily. 30 capsule 3   Turmeric 500 MG CAPS Take 1 capsule by mouth 2 (two) times daily.     VITAMIN D  PO Take 1,000 Units by mouth 2 (two) times daily.     zolpidem (AMBIEN) 5 MG tablet Take 5 mg by mouth at bedtime as needed for sleep.     No current facility-administered medications on file prior to visit.    BP 137/79    Pulse 80    Resp 18    Ht 5\' 2"  (1.575 m)    Wt 175 lb (79.4 kg)    LMP 02/11/2016 (Exact Date)    SpO2 100%    BMI 32.01 kg/m       Objective:   Physical Exam  General Mental Status- Alert. General Appearance- Not in acute distress.   Skin General: Color- Normal Color. Moisture- Normal Moisture.  Neck Carotid Arteries- Normal color. Moisture- Normal Moisture. No carotid bruits. No JVD.  Chest and Lung Exam Auscultation: Breath Sounds:-Normal.  Cardiovascular Auscultation:Rythm- Regular. Murmurs & Other Heart Sounds:Auscultation of the heart reveals- No Murmurs.  Abdomen Inspection:-Inspeection Normal. Palpation/Percussion:Note:No mass. Palpation and Percussion of the abdomen reveal- Non Tender, Non Distended + BS, no rebound or guarding.   Neurologic Cranial Nerve exam:- CN III-XII intact(No nystagmus), symmetric smile. Strength:- 5/5 equal and symmetric strength both upper and lower extremities.  L5-S1 sensation intact bilaterally.  Normal reflexes.  Back-paralumbar region tenderness to palpation as well as mild CVA tenderness.  No pain on straight leg lift.      Assessment & Plan:  Your BP was fairly elevated this weekend particularly back to 163/105 reading.  Blood pressure much better now that you have been on HCTZ 12.5 mg twice daily.  Think chlorthalidone 25 mg would be better option.  Sent that to your pharmacy.  Please get metabolic panel today.  Continue check blood pressure daily.  For CVA region tenderness and history of  UTI, will get urinalysis today and will send urine culture as last time he did have UTI.  Back pain as well recently and some of recent history as well as paralumbar region pain may consider muscle pain in differential.  You do have known findings on MRI back in 2020.  Notify us if your back pain worsens or you have radicular pain.  Presently recommend Tylenol and you could try Salonpas lidocaine patch.  For obesity and desired weight loss, I did give you print prescription of Saxenda.  Hopefully that will be recently covered.  Also discussed possibility of referral to weight loss management clinic.  Follow-up in 2 weeks or as needed.  Mackie Pai, PA-C   Time spent with patient today was 40  minutes which consisted of chart review, discussing diagnoses, work up, treatment and documentation.

## 2020-10-14 ENCOUNTER — Telehealth: Payer: Self-pay

## 2020-10-14 LAB — COMPLETE METABOLIC PANEL WITH GFR
AG Ratio: 1.7 (calc) (ref 1.0–2.5)
ALT: 11 U/L (ref 6–29)
AST: 12 U/L (ref 10–35)
Albumin: 4.3 g/dL (ref 3.6–5.1)
Alkaline phosphatase (APISO): 61 U/L (ref 31–125)
BUN/Creatinine Ratio: 8 (calc) (ref 6–22)
BUN: 6 mg/dL — ABNORMAL LOW (ref 7–25)
CO2: 31 mmol/L (ref 20–32)
Calcium: 10 mg/dL (ref 8.6–10.2)
Chloride: 101 mmol/L (ref 98–110)
Creat: 0.78 mg/dL (ref 0.50–1.10)
GFR, Est African American: 103 mL/min/{1.73_m2} (ref >=60)
GFR, Est Non African American: 89 mL/min/{1.73_m2} (ref >=60)
Globulin: 2.6 g/dL (calc) (ref 1.9–3.7)
Glucose, Bld: 88 mg/dL (ref 65–99)
Potassium: 4.4 mmol/L (ref 3.5–5.3)
Sodium: 139 mmol/L (ref 135–146)
Total Bilirubin: 0.6 mg/dL (ref 0.2–1.2)
Total Protein: 6.9 g/dL (ref 6.1–8.1)

## 2020-10-14 LAB — URINE CULTURE
MICRO NUMBER:: 11083805
SPECIMEN QUALITY:: ADEQUATE

## 2020-10-14 NOTE — Telephone Encounter (Signed)
PA approved. Effective 10/14/2020 to 02/14/2021.

## 2020-10-14 NOTE — Telephone Encounter (Signed)
PA initiated via Covermymeds; KEY: [id number]. Awaiting determination.

## 2020-10-16 ENCOUNTER — Other Ambulatory Visit: Payer: Self-pay | Admitting: Medical

## 2020-10-16 ENCOUNTER — Encounter: Payer: Self-pay | Admitting: Medical

## 2020-10-16 DIAGNOSIS — E6609 Other obesity due to excess calories: Secondary | ICD-10-CM

## 2020-10-17 MED ORDER — INSULIN PEN NEEDLE 31G X 8 MM MISC: 3 refills | Status: DC

## 2020-10-17 MED ORDER — LOSARTAN POTASSIUM 25 MG PO TABS
25.0000 mg | ORAL_TABLET | Freq: Every day | ORAL | 0 refills | Status: DC
Start: 2020-10-17 — End: 2021-02-18

## 2020-10-17 NOTE — Addendum Note (Signed)
Addended by: Jeronimo Greaves on: 10/17/2020 01:07 PM   Modules accepted: Orders

## 2020-10-24 ENCOUNTER — Ambulatory Visit: Payer: BC Managed Care – PPO | Admitting: Medical

## 2020-10-27 ENCOUNTER — Other Ambulatory Visit: Payer: Self-pay

## 2020-10-27 ENCOUNTER — Encounter: Payer: Self-pay | Admitting: Medical

## 2020-10-27 ENCOUNTER — Ambulatory Visit (INDEPENDENT_AMBULATORY_CARE_PROVIDER_SITE_OTHER): Payer: BC Managed Care – PPO | Admitting: Medical

## 2020-10-27 VITALS — BP 110/70 | HR 86 | Resp 18 | Ht 62.0 in | Wt 173.0 lb

## 2020-10-27 DIAGNOSIS — E785 Hyperlipidemia, unspecified: Secondary | ICD-10-CM | POA: Diagnosis not present

## 2020-10-27 DIAGNOSIS — I10 Essential (primary) hypertension: Secondary | ICD-10-CM

## 2020-10-27 DIAGNOSIS — E6609 Other obesity due to excess calories: Secondary | ICD-10-CM | POA: Diagnosis not present

## 2020-10-27 DIAGNOSIS — E1169 Type 2 diabetes mellitus with other specified complication: Secondary | ICD-10-CM | POA: Diagnosis not present

## 2020-10-27 DIAGNOSIS — Z6834 Body mass index (BMI) 34.0-34.9, adult: Secondary | ICD-10-CM

## 2020-10-27 NOTE — Progress Notes (Signed)
Subjective:    Patient ID: Ann Frederick, female    DOB: Apr 02, 1971, 49 y.o.   MRN: [id number]  HPI  Pt in for follow up.  Pt states with smaller cuff 128/82. Last visit losartan 25 mg was not helping bp. Pt is now on chlorthalidone 25 mg daily. No muscle cramps.   Hx of high cholesterol. She is on zetia.  Pt is trying to loose wt. She is on saxenda. Prior auth went thru.  Pt back pain did resolve. Pt never had to use salon pas. She did take tylenol.   Pt urine culture did not show any uti.   Review of Systems  Constitutional: Negative for chills, fatigue and fever.  Respiratory: Negative for chest tightness, shortness of breath and wheezing.   Cardiovascular: Negative for chest pain.  Gastrointestinal: Negative for abdominal pain.  Musculoskeletal: Negative for back pain.  Hematological: Negative for adenopathy. Does not bruise/bleed easily.    Past Medical History:  Diagnosis Date  . Acne   . Anemia    history of  . Anxiety   . Arthritis   . Depression   . GERD (gastroesophageal reflux disease)    worse while pregnant  . Headache    Migraines  . Heart murmur    ? heart murmur in past  . History of blood transfusion 2013  . Hypertension   . Metabolic syndrome      Social History   Socioeconomic History  . Marital status: Married    Spouse name: Not on file  . Number of children: 2  . Years of education: Not on file  . Highest education level: Not on file  Occupational History  . Not on file  Tobacco Use  . Smoking status: Never Smoker  . Smokeless tobacco: Never Used  Substance and Sexual Activity  . Alcohol use: Yes    Comment: 1 glass of wine per week prior to preg  . Drug use: No  . Sexual activity: Yes  Other Topics Concern  . Not on file  Social History Narrative  . Not on file   Social Determinants of Health   Financial Resource Strain:   . Difficulty of Paying Living Expenses: Not on file  Food Insecurity:   . Worried About Paediatric nurse in the Last Year: Not on file  . Ran Out of Food in the Last Year: Not on file  Transportation Needs:   . Lack of Transportation (Medical): Not on file  . Lack of Transportation (Non-Medical): Not on file  Physical Activity:   . Days of Exercise per Week: Not on file  . Minutes of Exercise per Session: Not on file  Stress:   . Feeling of Stress : Not on file  Social Connections:   . Frequency of Communication with Friends and Family: Not on file  . Frequency of Social Gatherings with Friends and Family: Not on file  . Attends Religious Services: Not on file  . Active Member of Clubs or Organizations: Not on file  . Attends Archivist Meetings: Not on file  . Marital Status: Not on file  Intimate Partner Violence:   . Fear of Current or Ex-Partner: Not on file  . Emotionally Abused: Not on file  . Physically Abused: Not on file  . Sexually Abused: Not on file    Past Surgical History:  Procedure Laterality Date  . ANTERIOR FUSION CERVICAL SPINE  2018  . CESAREAN SECTION  x 2  . CESAREAN SECTION  09/29/2012   Procedure: CESAREAN SECTION;  Surgeon: Luz Lex, MD;  Location: [facility];  Service: Obstetrics;  Laterality: N/A;  Repeat edc 10/12/12/REQUEST;Chassity,Dee,Colleen  . COLONOSCOPY    . ESOPHAGOGASTRODUODENOSCOPY  normal    7/12  . JOINT REPLACEMENT    . LAPAROSCOPIC VAGINAL HYSTERECTOMY WITH SALPINGECTOMY Bilateral 02/23/2016   Procedure: LAPAROSCOPIC ASSISTED VAGINAL HYSTERECTOMY WITH bilateral SALPINGECTOMY, right oophorectomy. laporotic repair of incidental cystotomy.;  Surgeon: Louretta Shorten, MD;  Location: [facility];  Service: Gynecology;  Laterality: Bilateral;  . MOUTH SURGERY      Family History  Problem Relation Age of Onset  . Cancer Father        ? lung CA  . Kidney disease Father   . Depression Mother   . Hypertension Mother   . Diabetes Mother   . Obesity Mother   . Thyroid disease Mother   . Hydrocephalus Sister   . Colon cancer  Neg Hx     Allergies  Allergen Reactions  . Azithromycin Nausea And Vomiting    diarrhea    Current Outpatient Medications on File Prior to Visit  Medication Sig Dispense Refill  . ALPRAZolam (XANAX) 0.5 MG tablet alprazolam 0.5 mg tablet  TAKE 1 TABLET BY MOUTH THREE TIMES A DAY AS NEEDED FOR ANXIETY    . aspirin 81 MG tablet Take 81 mg by mouth every other day.     Marland Kitchen buPROPion (WELLBUTRIN XL) 300 MG 24 hr tablet Take 300 mg by mouth every morning.    . busPIRone (BUSPAR) 7.5 MG tablet Take 1 tablet (7.5 mg total) by mouth 2 (two) times daily. 180 tablet 0  . chlorthalidone (HYGROTON) 25 MG tablet Take 1 tablet (25 mg total) by mouth daily. 30 tablet 0  . Diclofenac Sodium (VOLTAREN EX)     . ezetimibe (ZETIA) 10 MG tablet Take 10 mg by mouth daily.    . Insulin Pen Needle 31G X 8 MM MISC Use as directed to inject saxenda 100 each 3  . Liraglutide -Weight Management (SAXENDA) 18 MG/3ML SOPN Inject 0.6 mg into the skin daily. 0.6 mg daily on week 1, 1.2 milligrams daily week 2. 1.8 milligrams daily week 3, 2.4 mg daily on week 4, 3.0 mg daily on week 5 and thereafter. 15 mL 0  . MELATONIN PO Take 2 mg by mouth daily.    . metFORMIN (GLUCOPHAGE) 500 MG tablet TAKE 1 TABLET (500 MG TOTAL) BY MOUTH 2 (TWO) TIMES DAILY WITH A MEAL. 180 tablet 1  . omeprazole (PRILOSEC) 20 MG capsule Take 1 capsule (20 mg total) by mouth daily. 30 capsule 3  . Turmeric 500 MG CAPS Take 1 capsule by mouth 2 (two) times daily.    Marland Kitchen VITAMIN D PO Take 1,000 Units by mouth 2 (two) times daily.    Marland Kitchen zolpidem (AMBIEN) 5 MG tablet Take 5 mg by mouth at bedtime as needed for sleep.    Marland Kitchen losartan (COZAAR) 25 MG tablet Take 1 tablet (25 mg total) by mouth daily. 90 tablet 0   No current facility-administered medications on file prior to visit.    BP 112/75   Pulse 86   Resp 18   Ht 5\' 2"  (1.575 m)   Wt 173 lb (78.5 kg)   LMP 02/11/2016 (Exact Date)   SpO2 99%   BMI 31.64 kg/m      Objective:   Physical  Exam  General Mental Status- Alert. General Appearance- Not in  acute distress.   Skin General: Color- Normal Color. Moisture- Normal Moisture.  Neck Carotid Arteries- Normal color. Moisture- Normal Moisture. No carotid bruits. No JVD.  Chest and Lung Exam Auscultation: Breath Sounds:-Normal.  Cardiovascular Auscultation:Rythm- Regular. Murmurs & Other Heart Sounds:Auscultation of the heart reveals- No Murmurs.  Abdomen Inspection:-Inspeection Normal. Palpation/Percussion:Note:No mass. Palpation and Percussion of the abdomen reveal- Non Tender, Non Distended + BS, no rebound or guarding.   Neurologic Cranial Nerve exam:- CN III-XII intact(No nystagmus), symmetric smile. Strength:- 5/5 equal and symmetric strength both upper and lower extremities.      Assessment & Plan:  Bp well controlled. Continue chlorthalidone daily. Check cmp/k level today. Eat potassium rich diet.  For high cholesterol will repeat lipid panel to see evaluate how doing with zetia.  Good job on weight loss. Contiue saxenda.  No uti on recent culture and back pain resolved.   Follow up date to be determined after lab review. Likely 3-6 months.  Mackie Pai, PA-C

## 2020-10-27 NOTE — Patient Instructions (Signed)
Bp well controlled. Continue chlorthalidone daily. Check cmp/k level today. Eat potassium rich diet.  For high cholesterol will repeat lipid panel to see evaluate how doing with zetia.  Good job on weight loss. Contiue saxenda.  No uti on recent culture and back pain resolved.   Follow up date to be determined after lab review. Likely 3-6 months.

## 2020-10-28 LAB — COMPREHENSIVE METABOLIC PANEL
AG Ratio: 1.5 (calc) (ref 1.0–2.5)
ALT: 14 U/L (ref 6–29)
AST: 16 U/L (ref 10–35)
Albumin: 4.3 g/dL (ref 3.6–5.1)
Alkaline phosphatase (APISO): 61 U/L (ref 31–125)
BUN: 10 mg/dL (ref 7–25)
CO2: 32 mmol/L (ref 20–32)
Calcium: 10.1 mg/dL (ref 8.6–10.2)
Chloride: 98 mmol/L (ref 98–110)
Creat: 0.9 mg/dL (ref 0.50–1.10)
Globulin: 2.9 g/dL (calc) (ref 1.9–3.7)
Glucose, Bld: 81 mg/dL (ref 65–99)
Potassium: 3.4 mmol/L — ABNORMAL LOW (ref 3.5–5.3)
Sodium: 139 mmol/L (ref 135–146)
Total Bilirubin: 0.6 mg/dL (ref 0.2–1.2)
Total Protein: 7.2 g/dL (ref 6.1–8.1)

## 2020-10-28 LAB — LIPID PANEL
Cholesterol: 168 mg/dL (ref <200)
HDL: 49 mg/dL — ABNORMAL LOW (ref >=50)
LDL Cholesterol (Calc): 95 mg/dL (calc)
Non-HDL Cholesterol (Calc): 119 mg/dL (calc) (ref <130)
Total CHOL/HDL Ratio: 3.4 (calc) (ref <5.0)
Triglycerides: 143 mg/dL (ref <150)

## 2020-10-29 ENCOUNTER — Telehealth: Payer: Self-pay | Admitting: Medical

## 2020-10-29 MED ORDER — POTASSIUM CHLORIDE ER 10 MEQ PO TBCR: EXTENDED_RELEASE_TABLET | ORAL | 0 refills | Status: DC

## 2020-10-29 NOTE — Telephone Encounter (Signed)
Rx k-dur sent to pt pharmacy. 

## 2020-11-04 ENCOUNTER — Other Ambulatory Visit: Payer: Self-pay | Admitting: Medical

## 2020-11-04 MED ORDER — CHLORTHALIDONE 25 MG PO TABS
25.0000 mg | ORAL_TABLET | Freq: Every day | ORAL | 5 refills | Status: DC
Start: 2020-11-04 — End: 2021-04-09

## 2020-11-07 ENCOUNTER — Other Ambulatory Visit: Payer: Self-pay | Admitting: Medical

## 2020-11-07 ENCOUNTER — Telehealth: Payer: Self-pay | Admitting: Medical

## 2020-11-07 NOTE — Telephone Encounter (Signed)
What dose you have her on now?  Need sig.

## 2020-11-07 NOTE — Telephone Encounter (Signed)
I gave her pens of saxenda. I think dispensed 15 ml. Which is 5 pens. She was on titration schedule. So not sure if she would be out of all pens?? Has she titrated up to max dose or stoip due to side effects?

## 2020-11-10 NOTE — Telephone Encounter (Signed)
Pt called and lvm to return call 

## 2020-11-12 ENCOUNTER — Other Ambulatory Visit: Payer: Self-pay | Admitting: Medical

## 2020-11-14 DIAGNOSIS — Z96642 Presence of left artificial hip joint: Secondary | ICD-10-CM | POA: Diagnosis not present

## 2020-11-14 DIAGNOSIS — M1611 Unilateral primary osteoarthritis, right hip: Secondary | ICD-10-CM | POA: Diagnosis not present

## 2020-11-18 DIAGNOSIS — J988 Other specified respiratory disorders: Secondary | ICD-10-CM | POA: Diagnosis not present

## 2020-11-18 DIAGNOSIS — Z20822 Contact with and (suspected) exposure to covid-19: Secondary | ICD-10-CM | POA: Diagnosis not present

## 2020-11-18 DIAGNOSIS — R110 Nausea: Secondary | ICD-10-CM | POA: Diagnosis not present

## 2020-11-24 ENCOUNTER — Telehealth: Payer: BC Managed Care – PPO | Admitting: Physician Assistant

## 2020-11-24 DIAGNOSIS — N390 Urinary tract infection, site not specified: Secondary | ICD-10-CM | POA: Diagnosis not present

## 2020-11-24 MED ORDER — CEPHALEXIN 500 MG PO CAPS: ORAL_CAPSULE | ORAL | 0 refills | Status: DC

## 2020-11-24 NOTE — Progress Notes (Signed)

## 2020-11-26 ENCOUNTER — Other Ambulatory Visit: Payer: Self-pay | Admitting: Medical

## 2020-12-06 ENCOUNTER — Other Ambulatory Visit: Payer: Self-pay | Admitting: Medical

## 2020-12-08 ENCOUNTER — Other Ambulatory Visit: Payer: Self-pay | Admitting: Medical

## 2020-12-10 ENCOUNTER — Other Ambulatory Visit: Payer: Self-pay | Admitting: Medical

## 2020-12-15 ENCOUNTER — Other Ambulatory Visit: Payer: Self-pay | Admitting: Medical

## 2021-01-16 ENCOUNTER — Other Ambulatory Visit: Payer: Self-pay | Admitting: Medical

## 2021-02-16 NOTE — Progress Notes (Signed)
[facility] [address] [place], [place] [address] [phone]  Date:  02/18/2021   Name:  Ann Frederick   DOB:  06/16/1971   MRN:  [id number]  PCP:  Mackie Pai, PA-C    Chief Complaint: Wound Check (Right lower gluteal area, 2 weeks, )   History of Present Illness:  Ann Frederick is a 50 y.o. very pleasant female patient who presents with the following:  Pt here today with concern of a wound on her behind which is not healing well  I saw her myself about 2 years ago   covid booster complete  She notes that about 2 weeks ago she developed a sore spot near her gluteal cleft. She was concerned about an abscess or ingrown hair This but never became all that painful, but she knows it is not resolved either  It seemed to scab a bit but did not drain She has not had this in the past She otherwise feels ok- no fever or flu sx, no body aches   Patient Active Problem List   Diagnosis Date Noted  . Left hip pain 08/27/2019  . Class 1 obesity due to excess calories without serious comorbidity with body mass index (BMI) of 34.0 to 34.9 in adult 11/28/2018  . Heart palpitations 11/28/2018  . OSA (obstructive sleep apnea) 09/14/2018  . Neck pain 04/26/2018  . Wellness examination 03/08/2016  . S/P laparoscopic assisted vaginal hysterectomy (LAVH) 02/23/2016  . Pink eye 06/19/2015  . Pain of right thumb 06/19/2015  . Sinusitis, acute frontal 02/05/2015  . Headache around the eyes 02/05/2015  . Back pain 11/01/2014  . Routine general medical examination at a health care facility 10/30/2013  . Vesicular rash 09/07/2013  . Sinusitis 02/02/2013  . Viral pharyngitis 03/03/2012  . Flank pain 08/13/2011  . Inguinal adenopathy 08/13/2011  . Dysphagia 04/20/2011  . GERD 01/29/2011  . FATIGUE 01/29/2011  . 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 LEG PAIN, BILATERAL 06/06/2010  . HIP PAIN, LEFT 04/01/2010  . MASS,  LOCALIZED, SUPERFICIAL 03/05/2009  . ACNE VULGARIS 07/11/2007  . MURMUR 07/11/2007    Past Medical History:  Diagnosis Date  . Acne   . Anemia    history of  . Anxiety   . Arthritis   . Depression   . GERD (gastroesophageal reflux disease)    worse while pregnant  . Headache    Migraines  . Heart murmur    ? heart murmur in past  . History of blood transfusion 2013  . Hypertension   . Metabolic syndrome     Past Surgical History:  Procedure Laterality Date  . ANTERIOR FUSION CERVICAL SPINE  2018  . CESAREAN SECTION     x 2  . CESAREAN SECTION  09/29/2012   Procedure: CESAREAN SECTION;  Surgeon: Luz Lex, MD;  Location: [facility];  Service: Obstetrics;  Laterality: N/A;  Repeat edc 10/12/12/REQUEST;Chassity,Dee,Colleen  . COLONOSCOPY    . ESOPHAGOGASTRODUODENOSCOPY  normal    7/12  . JOINT REPLACEMENT    . LAPAROSCOPIC VAGINAL HYSTERECTOMY WITH SALPINGECTOMY Bilateral 02/23/2016   Procedure: LAPAROSCOPIC ASSISTED VAGINAL HYSTERECTOMY WITH bilateral SALPINGECTOMY, right oophorectomy. laporotic repair of incidental cystotomy.;  Surgeon: Louretta Shorten, MD;  Location: [facility];  Service: Gynecology;  Laterality: Bilateral;  . MOUTH SURGERY      Social History   Tobacco Use  . Smoking status: Never Smoker  . Smokeless tobacco: Never Used  Substance Use Topics  . Alcohol use: Yes    Comment: 1 glass of wine per week prior to preg  . Drug use: No    Family History  Problem Relation Age of Onset  . Cancer Father        ? lung CA  . Kidney disease Father   . Depression Mother   . Hypertension Mother   . Diabetes Mother   . Obesity Mother   . Thyroid disease Mother   . Hydrocephalus Sister   . Colon cancer Neg Hx     Allergies  Allergen Reactions  . Azithromycin Nausea And Vomiting    diarrhea    Medication list has been reviewed and updated.  Current Outpatient Medications on File Prior to Visit  Medication Sig Dispense Refill  . ALPRAZolam (XANAX) 0.5 MG  tablet alprazolam 0.5 mg tablet  TAKE 1 TABLET BY MOUTH THREE TIMES A DAY AS NEEDED FOR ANXIETY    . aspirin 81 MG tablet Take 81 mg by mouth every other day.     Marland Kitchen buPROPion (WELLBUTRIN XL) 300 MG 24 hr tablet Take 300 mg by mouth every morning.    . busPIRone (BUSPAR) 7.5 MG tablet Take 7.5 mg by mouth daily.    . chlorthalidone (HYGROTON) 25 MG tablet Take 1 tablet (25 mg total) by mouth daily. 30 tablet 5  . Diclofenac Sodium (VOLTAREN EX)     . ezetimibe (ZETIA) 10 MG tablet TAKE 1 TABLET BY MOUTH EVERY DAY 90 tablet 3  . Insulin Pen Needle 31G X 8 MM MISC Use as directed to inject saxenda 100 each 3  . Liraglutide -Weight Management (SAXENDA) 18 MG/3ML SOPN Inject 0.6 mg into the skin daily.    . metFORMIN (GLUCOPHAGE) 500 MG tablet TAKE 1 TABLET (500 MG TOTAL) BY MOUTH 2 (TWO) TIMES DAILY WITH A MEAL. 180 tablet 1  . potassium chloride (KLOR-CON) 10 MEQ tablet Take 1 tablet (10 mEq total) by mouth every other day. 45 tablet 0  . Turmeric 500 MG CAPS Take 1 capsule by mouth 2 (two) times daily.    Marland Kitchen VITAMIN D PO Take 1,000 Units by mouth 2 (two) times daily.    Marland Kitchen zolpidem (AMBIEN) 5 MG tablet Take 5 mg by mouth at bedtime as needed for sleep.     No current facility-administered medications on file prior to visit.    Review of Systems:  As per HPI- otherwise negative.   Physical Examination: Vitals:   02/18/21 0841  BP: 118/76  Pulse: 87  Resp: 17  SpO2: 97%   Vitals:   02/18/21 0841  Weight: 165 lb (74.8 kg)  Height: 5\' 2"  (1.575 m)   Body mass index is 30.18 kg/m. Ideal Body Weight: Weight in (lb) to have BMI = 25: 136.4  GEN: no acute distress.  Overweight, otherwise looks well HEENT: Atraumatic, Normocephalic.  Ears and Nose: No external deformity. CV: RRR, No M/G/R. No JVD. No thrill. No extra heart sounds. PULM: CTA B, no wheezes, crackles, rhonchi. No retractions. No resp. distress. No accessory muscle use. EXTR: No c/c/e PSYCH: Normally interactive.  Conversant.  The left buttock near the gluteal cleft displays a small area which appears consistent with a healing ingrown hair or small abscess.  It is not fluctuant, not red or hot.  I do not feel there is any pus retained.  Seems to be self resolving   Assessment and Plan: Skin infection - Plan: doxycycline (VIBRAMYCIN) 100 MG capsule Patient today  with possible skin infection/small abscess on her buttock.  This does appear to be getting better on its own.  I advised patient that at this time I do not see any need for I&D or further treatment.  I gave her a paper prescription for doxycycline to hold which she can start if symptoms worsen.  She will let me know in this case This visit occurred during the SARS-CoV-2 public health emergency.  Safety protocols were in place, including screening questions prior to the visit, additional usage of staff PPE, and extensive cleaning of exam room while observing appropriate contact time as indicated for disinfecting solutions.    Signed Lamar Blinks, MD

## 2021-02-18 ENCOUNTER — Ambulatory Visit (INDEPENDENT_AMBULATORY_CARE_PROVIDER_SITE_OTHER): Payer: BC Managed Care – PPO | Admitting: Family Medicine

## 2021-02-18 ENCOUNTER — Other Ambulatory Visit: Payer: Self-pay

## 2021-02-18 ENCOUNTER — Encounter: Payer: Self-pay | Admitting: Family Medicine

## 2021-02-18 VITALS — BP 118/76 | HR 87 | Resp 17 | Ht 62.0 in | Wt 165.0 lb

## 2021-02-18 DIAGNOSIS — L089 Local infection of the skin and subcutaneous tissue, unspecified: Secondary | ICD-10-CM

## 2021-02-18 MED ORDER — DOXYCYCLINE HYCLATE 100 MG PO CAPS: 100.0000 mg | ORAL_CAPSULE | Freq: Two times a day (BID) | ORAL | 0 refills | Status: DC

## 2021-02-18 NOTE — Patient Instructions (Signed)
Good to see you today!  I think your body has cleared up this little infection on your buttock.  I gave you an rx for doxycycline antibiotic which you can fill and use if needed- if infection seems to be coming back.  Let me know if you are concerned about how it is healing and take care!

## 2021-03-19 ENCOUNTER — Telehealth: Payer: BC Managed Care – PPO | Admitting: Emergency Medicine

## 2021-03-19 DIAGNOSIS — H1031 Unspecified acute conjunctivitis, right eye: Secondary | ICD-10-CM | POA: Diagnosis not present

## 2021-03-19 MED ORDER — OFLOXACIN 0.3 % OP SOLN: 1.0000 [drp] | Freq: Four times a day (QID) | OPHTHALMIC | 0 refills | Status: DC

## 2021-03-19 NOTE — Progress Notes (Signed)
E-Visit for Ann Frederick   We are sorry that you are not feeling well.  Here is how we plan to help!  Based on what you have shared with me it looks like you have conjunctivitis.  Conjunctivitis is a common inflammatory or infectious condition of the eye that is often referred to as "pink eye".  In most cases it is contagious (viral or bacterial). However, not all conjunctivitis requires antibiotics (ex. Allergic).  We have made appropriate suggestions for you based upon your presentation.  I have prescribed Oflaxacin 1-2 drops 4 times a day times 5 days   Pink eye can be highly contagious.  It is typically spread through direct contact with secretions, or contaminated objects or surfaces that one may have touched.  Strict handwashing is suggested with soap and water is urged.  If not available, use alcohol based had sanitizer.  Avoid unnecessary touching of the eye.  If you wear contact lenses, you will need to refrain from wearing them until you see no white discharge from the eye for at least 24 hours after being on medication.  You should see symptom improvement in 1-2 days after starting the medication regimen.  Call us if symptoms are not improved in 1-2 days.  Home Care:  Wash your hands often!  Do not wear your contacts until you complete your treatment plan.  Avoid sharing towels, bed linen, personal items with a person who has pink eye.  See attention for anyone in your home with similar symptoms.  Get Help Right Away If:  Your symptoms do not improve.  You develop blurred or loss of vision.  Your symptoms worsen (increased discharge, pain or redness)  Your e-visit answers were reviewed by a board certified advanced clinical practitioner to complete your personal care plan.  Depending on the condition, your plan could have included both over the counter or prescription medications.  If there is a problem please reply  once you have received a response from your provider.  Your  safety is important to [place].  If you have drug allergies check your prescription carefully.    You can use MyChart to ask questions about today's visit, request a non-urgent call back, or ask for a work or school excuse for 24 hours related to this e-Visit. If it has been greater than 24 hours you will need to follow up with your provider, or enter a new e-Visit to address those concerns.   You will get an e-mail in the next two days asking about your experience.  I hope that your e-visit has been valuable and will speed your recovery. Thank you for using e-visits.   Approximately 5 minutes was spent documenting and reviewing patient's chart.

## 2021-04-02 ENCOUNTER — Other Ambulatory Visit: Payer: Self-pay | Admitting: Medical

## 2021-04-02 ENCOUNTER — Telehealth: Payer: BC Managed Care – PPO | Admitting: Orthopedic Surgery

## 2021-04-02 DIAGNOSIS — B001 Herpesviral vesicular dermatitis: Secondary | ICD-10-CM | POA: Diagnosis not present

## 2021-04-02 MED ORDER — VALACYCLOVIR HCL 1 G PO TABS: 2000.0000 mg | ORAL_TABLET | Freq: Two times a day (BID) | ORAL | 0 refills | Status: AC

## 2021-04-02 NOTE — Progress Notes (Signed)
We are sorry that you are not feeling well.  Here is how we plan to help!  Based on what you have shared with me it does look like you have a viral infection.    Most cold sores or fever blisters are small fluid filled blisters around the mouth caused by herpes simplex virus.  The most common strain of the virus causing cold sores is herpes simplex virus 1.  It can be spread by skin contact, sharing eating utensils, or even sharing towels.  Cold sores are contagious to other people until dry. (Approximately 5-7 days).  Wash your hands. You can spread the virus to your eyes through handling your contact lenses after touching the lesions.  Most people experience pain at the sight or tingling sensations in their lips that may begin before the ulcers erupt.  Herpes simplex is treatable but not curable.  It may lie dormant for a long time and then reappear due to stress or prolonged sun exposure.  Many patients have success in treating their cold sores with an over the counter topical called Abreva.  You may apply the cream up to 5 times daily (maximum 10 days) until healing occurs.  If you would like to use an oral antiviral medication to speed the healing of your cold sore, I have sent a prescription to your local pharmacy Valacyclovir 2 gm take one by mouth twice a day for 1 day    HOME CARE:   Wash your hands frequently.  Do not pick at or rub the sore.  Don't open the blisters.  Avoid kissing other people during this time.  Avoid sharing drinking glasses, eating utensils, or razors.  Do not handle contact lenses unless you have thoroughly washed your hands with soap and warm water!  Avoid oral sex during this time.  Herpes from sores on your mouth can spread to your partner's genital area.  Avoid contact with anyone who has eczema or a weakened immune system.  Cold sores are often triggered by exposure to intense sunlight, use a lip balm containing a sunscreen (SPF 30 or  higher).  GET HELP RIGHT AWAY IF:   Blisters look infected.  Blisters occur near or in the eye.  Symptoms last longer than 10 days.  Your symptoms become worse.  MAKE SURE YOU:   Understand these instructions.  Will watch your condition.  Will get help right away if you are not doing well or get worse.    Your e-visit answers were reviewed by a board certified advanced clinical practitioner to complete your personal care plan.  Depending upon the condition, your plan could have  Included both over the counter or prescription medications.    Please review your pharmacy choice.  Be sure that the pharmacy you have chosen is open so that you can pick up your prescription now.  If there is a problem you can message your provider in [place] to have the prescription routed to another pharmacy.    Your safety is important to [place].  If you have drug allergies check our prescription carefully.  For the next 24 hours you can use MyChart to ask questions about today's visit, request a non-urgent call back, or ask for a work or school excuse from your e-visit provider.  You will get an email in the next two days asking about your experience.  I hope that your e-visit has been valuable and will speed your recovery.   Greater than 5 minutes, yet less  than 10 minutes of time have been spent researching, coordinating and implementing care for this patient today.

## 2021-04-09 ENCOUNTER — Other Ambulatory Visit: Payer: Self-pay | Admitting: Medical

## 2021-04-26 ENCOUNTER — Encounter: Payer: Self-pay | Admitting: Medical

## 2021-04-27 MED ORDER — METFORMIN HCL 500 MG PO TABS: 500.0000 mg | ORAL_TABLET | Freq: Two times a day (BID) | ORAL | 1 refills | Status: DC

## 2021-04-27 MED ORDER — BUSPIRONE HCL 7.5 MG PO TABS: 7.5000 mg | ORAL_TABLET | Freq: Every day | ORAL | 2 refills | Status: DC

## 2021-05-24 ENCOUNTER — Other Ambulatory Visit: Payer: Self-pay | Admitting: Medical

## 2021-06-22 ENCOUNTER — Encounter: Payer: Self-pay | Admitting: Medical

## 2021-06-22 DIAGNOSIS — Z01419 Encounter for gynecological examination (general) (routine) without abnormal findings: Secondary | ICD-10-CM | POA: Diagnosis not present

## 2021-06-22 DIAGNOSIS — N390 Urinary tract infection, site not specified: Secondary | ICD-10-CM | POA: Diagnosis not present

## 2021-06-22 DIAGNOSIS — Z1231 Encounter for screening mammogram for malignant neoplasm of breast: Secondary | ICD-10-CM | POA: Diagnosis not present

## 2021-06-22 DIAGNOSIS — Z6830 Body mass index (BMI) 30.0-30.9, adult: Secondary | ICD-10-CM | POA: Diagnosis not present

## 2021-07-03 ENCOUNTER — Other Ambulatory Visit: Payer: Self-pay

## 2021-07-03 ENCOUNTER — Telehealth: Payer: BC Managed Care – PPO | Admitting: Medical

## 2021-07-08 ENCOUNTER — Telehealth: Payer: BC Managed Care – PPO | Admitting: Physician Assistant

## 2021-07-08 DIAGNOSIS — H1030 Unspecified acute conjunctivitis, unspecified eye: Secondary | ICD-10-CM

## 2021-07-08 MED ORDER — POLYMYXIN B-TRIMETHOPRIM 10000-0.1 UNIT/ML-% OP SOLN: OPHTHALMIC | 0 refills | Status: DC

## 2021-07-08 NOTE — Progress Notes (Signed)
I have spent 5 minutes in review of e-visit questionnaire, review and updating patient chart, medical decision making and response to patient.   Jean Alejos Cody Lorette Peterkin, PA-C    

## 2021-07-08 NOTE — Progress Notes (Signed)

## 2021-07-15 ENCOUNTER — Other Ambulatory Visit: Payer: Self-pay | Admitting: Medical

## 2021-07-24 ENCOUNTER — Ambulatory Visit: Payer: BC Managed Care – PPO | Admitting: Medical

## 2021-07-24 ENCOUNTER — Telehealth: Payer: Self-pay | Admitting: *Deleted

## 2021-07-24 NOTE — Telephone Encounter (Signed)
Left message on machine that appt has been cancelled and for her to call back to reschedule.

## 2021-07-24 NOTE — Telephone Encounter (Signed)
Caller Name Mitchell Phone Number [phone] Patient Name Ann Frederick Patient DOB 1971-03-28 Call Type Message Only Information Provided Reason for Call Request to [facility] Appointment Initial Comment She is cancel her appointment for tomorrow. Patient request to speak to RN No Additional Comment It was 940AM 7/29. Disp. Time Disposition Final User 07/24/2021 12:17:39 AM General Information Provided Yes Wisdom, Thedore Mins

## 2021-07-26 ENCOUNTER — Encounter: Payer: Self-pay | Admitting: Medical

## 2021-07-27 ENCOUNTER — Other Ambulatory Visit: Payer: Self-pay

## 2021-07-27 MED ORDER — BUSPIRONE HCL 7.5 MG PO TABS: 7.5000 mg | ORAL_TABLET | Freq: Every day | ORAL | 1 refills | Status: DC

## 2021-08-06 ENCOUNTER — Other Ambulatory Visit: Payer: Self-pay

## 2021-08-06 ENCOUNTER — Ambulatory Visit (INDEPENDENT_AMBULATORY_CARE_PROVIDER_SITE_OTHER): Payer: BC Managed Care – PPO | Admitting: Medical

## 2021-08-06 VITALS — BP 111/74 | HR 87 | Ht 62.0 in | Wt 165.8 lb

## 2021-08-06 DIAGNOSIS — E785 Hyperlipidemia, unspecified: Secondary | ICD-10-CM | POA: Diagnosis not present

## 2021-08-06 DIAGNOSIS — E1169 Type 2 diabetes mellitus with other specified complication: Secondary | ICD-10-CM | POA: Diagnosis not present

## 2021-08-06 DIAGNOSIS — I10 Essential (primary) hypertension: Secondary | ICD-10-CM | POA: Diagnosis not present

## 2021-08-06 DIAGNOSIS — R739 Hyperglycemia, unspecified: Secondary | ICD-10-CM

## 2021-08-06 DIAGNOSIS — M25552 Pain in left hip: Secondary | ICD-10-CM | POA: Diagnosis not present

## 2021-08-06 LAB — COMPREHENSIVE METABOLIC PANEL
ALT: 11 U/L (ref 0–35)
AST: 14 U/L (ref 0–37)
Albumin: 4.2 g/dL (ref 3.5–5.2)
Alkaline Phosphatase: 49 U/L (ref 39–117)
BUN: 11 mg/dL (ref 6–23)
CO2: 31 mEq/L (ref 19–32)
Calcium: 9.7 mg/dL (ref 8.4–10.5)
Chloride: 99 mEq/L (ref 96–112)
Creatinine, Ser: 0.97 mg/dL (ref 0.40–1.20)
GFR: 68.46 mL/min (ref >60.00)
Glucose, Bld: 85 mg/dL (ref 70–99)
Potassium: 3.6 mEq/L (ref 3.5–5.1)
Sodium: 139 mEq/L (ref 135–145)
Total Bilirubin: 0.6 mg/dL (ref 0.2–1.2)
Total Protein: 7.1 g/dL (ref 6.0–8.3)

## 2021-08-06 LAB — LIPID PANEL
Cholesterol: 192 mg/dL (ref 0–200)
HDL: 48.6 mg/dL (ref >39.00)
NonHDL: 143.5
Total CHOL/HDL Ratio: 4
Triglycerides: 234 mg/dL — ABNORMAL HIGH (ref 0.0–149.0)
VLDL: 46.8 mg/dL — ABNORMAL HIGH (ref 0.0–40.0)

## 2021-08-06 LAB — LDL CHOLESTEROL, DIRECT: Direct LDL: 107 mg/dL

## 2021-08-06 LAB — HEMOGLOBIN A1C: Hgb A1c MFr Bld: 5.2 % (ref 4.6–6.5)

## 2021-08-06 NOTE — Patient Instructions (Addendum)
Htn well controlled. Continue chlorthalidone daily. Check cmp/k level  For high cholesterol check lipid panel. Continue zetia.  For elevated sugar check a1c.  Good job on weight loss. Continue saxenda.  For left hip pain continue voltaren. Follow up with orthopedist if you decide on replacement.  For anxiety continue buspar but increase dose to 15 mg daily.  Follow up 3-6 months or sooner if needed.

## 2021-08-06 NOTE — Progress Notes (Signed)
Subjective:    Patient ID: Ann Frederick, female    DOB: December 04, 1971, 50 y.o.   MRN: [id number]  HPI  Pt in for follow up.   History of htn. Pt on chlorthalidone 25 mg daily. Bp well controlled.  High cholesterol. She is on zetia daily.  Pt has elvated sugar in past but a1c not in diabetic range.  Pt has been exercising. Walking at lunch.   Pt note rt hip pain. Told in past needs hip replacement.   Pt has been dieting and is on saxenda. Recent mild non compliance. Taking every other day. She states it constipates her.    Pt increased buspar to 15 mg twice a day. Working much better than 7.5 mg bid.     Review of Systems  Constitutional:  Negative for chills, fatigue and fever.  HENT:  Negative for congestion, ear discharge and ear pain.   Respiratory:  Negative for choking, shortness of breath and wheezing.   Cardiovascular:  Negative for chest pain and palpitations.  Gastrointestinal:  Negative for abdominal pain, diarrhea and nausea.       If takes saxenda daily will get constipation.  Genitourinary:  Negative for dysuria.  Musculoskeletal:  Negative for back pain and myalgias.  Skin:  Negative for rash.  Neurological:  Negative for dizziness, speech difficulty, weakness, light-headedness and headaches.  Hematological:  Negative for adenopathy. Does not bruise/bleed easily.  Psychiatric/Behavioral:  Negative for behavioral problems and confusion.    Past Medical History:  Diagnosis Date   Acne    Anemia    history of   Anxiety    Arthritis    Depression    GERD (gastroesophageal reflux disease)    worse while pregnant   Headache    Migraines   Heart murmur    ? heart murmur in past   History of blood transfusion 2013   Hypertension    Metabolic syndrome      Social History   Socioeconomic History   Marital status: Married    Spouse name: Not on file   Number of children: 2   Years of education: Not on file   Highest education level: Not on file   Occupational History   Not on file  Tobacco Use   Smoking status: Never   Smokeless tobacco: Never  Substance and Sexual Activity   Alcohol use: Yes    Comment: 1 glass of wine per week prior to preg   Drug use: No   Sexual activity: Yes  Other Topics Concern   Not on file  Social History Narrative   Not on file   Social Determinants of Health   Financial Resource Strain: Not on file  Food Insecurity: Not on file  Transportation Needs: Not on file  Physical Activity: Not on file  Stress: Not on file  Social Connections: Not on file  Intimate Partner Violence: Not on file    Past Surgical History:  Procedure Laterality Date   ANTERIOR FUSION CERVICAL SPINE  2018   CESAREAN SECTION     x 2   CESAREAN SECTION  09/29/2012   Procedure: CESAREAN SECTION;  Surgeon: Luz Lex, MD;  Location: [facility];  Service: Obstetrics;  Laterality: N/A;  Repeat edc 10/12/12/REQUEST;Chassity,Dee,Colleen   COLONOSCOPY     ESOPHAGOGASTRODUODENOSCOPY  normal    7/12   JOINT REPLACEMENT     LAPAROSCOPIC VAGINAL HYSTERECTOMY WITH SALPINGECTOMY Bilateral 02/23/2016   Procedure: LAPAROSCOPIC ASSISTED VAGINAL HYSTERECTOMY WITH bilateral SALPINGECTOMY, right oophorectomy.  laporotic repair of incidental cystotomy.;  Surgeon: Louretta Shorten, MD;  Location: [facility];  Service: Gynecology;  Laterality: Bilateral;   MOUTH SURGERY      Family History  Problem Relation Age of Onset   Cancer Father        ? lung CA   Kidney disease Father    Depression Mother    Hypertension Mother    Diabetes Mother    Obesity Mother    Thyroid disease Mother    Hydrocephalus Sister    Colon cancer Neg Hx     Allergies  Allergen Reactions   Azithromycin Nausea And Vomiting    diarrhea    Current Outpatient Medications on File Prior to Visit  Medication Sig Dispense Refill   ALPRAZolam (XANAX) 0.5 MG tablet alprazolam 0.5 mg tablet  TAKE 1 TABLET BY MOUTH THREE TIMES A DAY AS NEEDED FOR ANXIETY     aspirin 81  MG tablet Take 81 mg by mouth every other day.      buPROPion (WELLBUTRIN XL) 300 MG 24 hr tablet Take 300 mg by mouth every morning.     busPIRone (BUSPAR) 7.5 MG tablet Take 1 tablet (7.5 mg total) by mouth daily. 90 tablet 1   chlorthalidone (HYGROTON) 25 MG tablet TAKE 1 TABLET BY MOUTH EVERY DAY 90 tablet 1   Diclofenac Sodium (VOLTAREN EX)      ezetimibe (ZETIA) 10 MG tablet TAKE 1 TABLET BY MOUTH EVERY DAY 90 tablet 3   Insulin Pen Needle 31G X 8 MM MISC Use as directed to inject saxenda 100 each 3   Liraglutide -Weight Management (SAXENDA) 18 MG/3ML SOPN Inject 0.6 mg into the skin daily.     metFORMIN (GLUCOPHAGE) 500 MG tablet Take 1 tablet (500 mg total) by mouth 2 (two) times daily with a meal. 180 tablet 1   potassium chloride (KLOR-CON) 10 MEQ tablet TAKE 1 TABLET (10 MEQ TOTAL) BY MOUTH EVERY OTHER DAY. 45 tablet 0   Turmeric 500 MG CAPS Take 1 capsule by mouth 2 (two) times daily.     VITAMIN D PO Take 1,000 Units by mouth 2 (two) times daily.     zolpidem (AMBIEN) 5 MG tablet Take 5 mg by mouth at bedtime as needed for sleep.     No current facility-administered medications on file prior to visit.    BP 111/74   Pulse 87   Ht '5\' 2"'$  (1.575 m)   Wt 165 lb 12.8 oz (75.2 kg)   LMP 02/11/2016 (Exact Date)   SpO2 97%   BMI 30.33 kg/m        Objective:   Physical Exam  General Mental Status- Alert. General Appearance- Not in acute distress.   Skin General: Color- Normal Color. Moisture- Normal Moisture.  Neck Carotid Arteries- Normal color. Moisture- Normal Moisture. No carotid bruits. No JVD.  Chest and Lung Exam Auscultation: Breath Sounds:-Normal.  Cardiovascular Auscultation:Rythm- Regular. Murmurs & Other Heart Sounds:Auscultation of the heart reveals- No Murmurs.  Abdomen Inspection:-Inspeection Normal. Palpation/Percussion:Note:No mass. Palpation and Percussion of the abdomen reveal- Non Tender, Non Distended + BS, no rebound or  guarding.   Neurologic Cranial Nerve exam:- CN III-XII intact(No nystagmus), symmetric smile. Strength:- 5/5 equal and symmetric strength both upper and lower extremities.       Assessment & Plan:   Htn well controlled. Continue chlorthalidone daily. Check cmp/k level  For high cholesterol check lipid panel. Continue zetia.  For elevated sugar check a1c.  Good job on weight loss.  Continue saxenda.  For left hip pain continue voltaren. Follow up with orthopedist if you decide on replacement.  For anxiety continue buspar.  Follow up 3-6 months or sooner if needed.

## 2021-09-11 ENCOUNTER — Encounter: Payer: Self-pay | Admitting: Medical

## 2021-09-14 ENCOUNTER — Telehealth: Payer: Self-pay | Admitting: Medical

## 2021-09-14 MED ORDER — BUSPIRONE HCL 15 MG PO TABS: 15.0000 mg | ORAL_TABLET | Freq: Two times a day (BID) | ORAL | 4 refills | Status: DC

## 2021-09-14 NOTE — Telephone Encounter (Signed)
Key: [id number]   PA started today

## 2021-09-14 NOTE — Telephone Encounter (Signed)
Refill buspar 15 mg twice daily.

## 2021-09-22 NOTE — Telephone Encounter (Signed)
As long as you remain covered by your prescription drug plan and there are no changes to your plan benefits, this request is approved from 09/16/2021 to 09/16/2022

## 2021-10-06 ENCOUNTER — Other Ambulatory Visit: Payer: Self-pay | Admitting: Medical

## 2021-10-19 ENCOUNTER — Other Ambulatory Visit: Payer: Self-pay | Admitting: Medical

## 2021-10-22 ENCOUNTER — Ambulatory Visit: Payer: BC Managed Care – PPO | Admitting: Medical

## 2021-11-06 ENCOUNTER — Ambulatory Visit: Payer: BC Managed Care – PPO

## 2021-11-23 DIAGNOSIS — Z96642 Presence of left artificial hip joint: Secondary | ICD-10-CM | POA: Diagnosis not present

## 2021-11-23 DIAGNOSIS — M1611 Unilateral primary osteoarthritis, right hip: Secondary | ICD-10-CM | POA: Diagnosis not present

## 2021-12-03 ENCOUNTER — Other Ambulatory Visit: Payer: Self-pay | Admitting: Medical

## 2021-12-07 ENCOUNTER — Telehealth: Payer: Self-pay | Admitting: Medical

## 2021-12-07 NOTE — Telephone Encounter (Signed)
Pt called and lvm to return phone call

## 2021-12-07 NOTE — Telephone Encounter (Signed)
Pt needs preop before. I have form to fill out. They want her to have exam and labs. She is scheduled on 12-24-21 tentatviely. Schedule is tight and she needs. Can you schedule her tomorrow afternoon at 4 pm.

## 2021-12-08 NOTE — Telephone Encounter (Signed)
Appt made

## 2021-12-09 ENCOUNTER — Ambulatory Visit: Payer: BC Managed Care – PPO | Admitting: Family Medicine

## 2021-12-11 ENCOUNTER — Ambulatory Visit (INDEPENDENT_AMBULATORY_CARE_PROVIDER_SITE_OTHER): Payer: BC Managed Care – PPO | Admitting: Medical

## 2021-12-11 VITALS — BP 120/80 | HR 100 | Resp 18 | Ht 62.0 in | Wt 163.0 lb

## 2021-12-11 DIAGNOSIS — Z01818 Encounter for other preprocedural examination: Secondary | ICD-10-CM

## 2021-12-11 DIAGNOSIS — R739 Hyperglycemia, unspecified: Secondary | ICD-10-CM | POA: Diagnosis not present

## 2021-12-11 DIAGNOSIS — U071 COVID-19: Secondary | ICD-10-CM

## 2021-12-11 DIAGNOSIS — I10 Essential (primary) hypertension: Secondary | ICD-10-CM

## 2021-12-11 DIAGNOSIS — R059 Cough, unspecified: Secondary | ICD-10-CM

## 2021-12-11 LAB — POC COVID19 BINAXNOW: SARS Coronavirus 2 Ag: POSITIVE — AB

## 2021-12-11 LAB — POCT INFLUENZA A/B
Influenza A, POC: NEGATIVE
Influenza B, POC: NEGATIVE

## 2021-12-11 MED ORDER — FLUTICASONE PROPIONATE 50 MCG/ACT NA SUSP: 2.0000 | Freq: Every day | NASAL | 1 refills | Status: DC

## 2021-12-11 MED ORDER — MOLNUPIRAVIR EUA 200MG CAPSULE: 4.0000 | ORAL_CAPSULE | Freq: Two times a day (BID) | ORAL | 0 refills | Status: AC

## 2021-12-11 MED ORDER — BENZONATATE 100 MG PO CAPS: 100.0000 mg | ORAL_CAPSULE | Freq: Three times a day (TID) | ORAL | 0 refills | Status: DC | PRN

## 2021-12-11 NOTE — Addendum Note (Signed)
Addended by: Jeronimo Greaves on: 12/11/2021 09:57 AM   Modules accepted: Orders

## 2021-12-11 NOTE — Patient Instructions (Signed)
COVID infection.  Mild symptoms with risk score of 3.  Prior COVID vaccination x3.  Day 2 since symptom onset.  Prescribing molnupiravir antiviral.  Also prescribing Flonase for nasal congestion and benzonatate for cough.  Recommend checking O2 sat daily and make sure above 96%.  If you begin to develop secondary bacterial infection such as sinusitis or bronchitis type signs/symptoms will consider prescribing antibiotic.  If severe chest congestion recommend outpatient chest x-ray.  Preop physical exam today normal.  You had mentioned surgery is actually going to be in February.  I am placing a future labs and want to get scheduled 10 days prior to the surgery date.  If you have any changing signs symptoms before then they would like to repeat physical exam.  Blood pressure well controlled today.  Follow-up date to be determined depending on how you do clinically with COVID.  As needed as well.

## 2021-12-11 NOTE — Progress Notes (Signed)
Subjective:    Patient ID: Ann Frederick, female    DOB: 11-Jun-1971, 50 y.o.   MRN: [id number]  HPI  Pt in for preop evaluation.  She is getting rt hip replacement. She need form signed off and labs.   Prior left hip replacement. No reaction to anesthesia. 8 prior surgeries. No hx of sudden death in family Surgery is 02-19-21. No asthma. No cardiac symptoms. 10-03-2020 ekg was normal.    Pt also states this wed got st, nasal congestion, temp 99.5, upper body aches  and cough. Tested negative for covid yesterday. Pt got vaccinated against covid x 3.   Review of Systems  Constitutional:  Negative for chills, fatigue and fever.  HENT:  Positive for congestion and sore throat.   Respiratory:  Positive for cough. Negative for choking, shortness of breath and wheezing.   Cardiovascular:  Negative for chest pain and palpitations.  Gastrointestinal:  Negative for abdominal pain, blood in stool and diarrhea.  Genitourinary:  Negative for dysuria.  Musculoskeletal:  Negative for back pain, gait problem and neck pain.  Skin:  Negative for rash.    Past Medical History:  Diagnosis Date   Acne    Anemia    history of   Anxiety    Arthritis    Depression    GERD (gastroesophageal reflux disease)    worse while pregnant   Headache    Migraines   Heart murmur    ? heart murmur in past   History of blood transfusion 2013   Hypertension    Metabolic syndrome      Social History   Socioeconomic History   Marital status: Married    Spouse name: Not on file   Number of children: 2   Years of education: Not on file   Highest education level: Not on file  Occupational History   Not on file  Tobacco Use   Smoking status: Never   Smokeless tobacco: Never  Substance and Sexual Activity   Alcohol use: Yes    Comment: 1 glass of wine per week prior to preg   Drug use: No   Sexual activity: Yes  Other Topics Concern   Not on file  Social History Narrative   Not on file    Social Determinants of Health   Financial Resource Strain: Not on file  Food Insecurity: Not on file  Transportation Needs: Not on file  Physical Activity: Not on file  Stress: Not on file  Social Connections: Not on file  Intimate Partner Violence: Not on file    Past Surgical History:  Procedure Laterality Date   ANTERIOR FUSION CERVICAL SPINE  2018   CESAREAN SECTION     x 2   CESAREAN SECTION  09/29/2012   Procedure: CESAREAN SECTION;  Surgeon: Luz Lex, MD;  Location: [facility];  Service: Obstetrics;  Laterality: N/A;  Repeat edc 10/12/12/REQUEST;Chassity,Dee,Colleen   COLONOSCOPY     ESOPHAGOGASTRODUODENOSCOPY  normal    7/12   JOINT REPLACEMENT     LAPAROSCOPIC VAGINAL HYSTERECTOMY WITH SALPINGECTOMY Bilateral 02/23/2016   Procedure: LAPAROSCOPIC ASSISTED VAGINAL HYSTERECTOMY WITH bilateral SALPINGECTOMY, right oophorectomy. laporotic repair of incidental cystotomy.;  Surgeon: Louretta Shorten, MD;  Location: [facility];  Service: Gynecology;  Laterality: Bilateral;   MOUTH SURGERY      Family History  Problem Relation Age of Onset   Cancer Father        ? lung CA   Kidney disease Father  Depression Mother    Hypertension Mother    Diabetes Mother    Obesity Mother    Thyroid disease Mother    Hydrocephalus Sister    Colon cancer Neg Hx     Allergies  Allergen Reactions   Azithromycin Nausea And Vomiting    diarrhea    Current Outpatient Medications on File Prior to Visit  Medication Sig Dispense Refill   ALPRAZolam (XANAX) 0.5 MG tablet alprazolam 0.5 mg tablet  TAKE 1 TABLET BY MOUTH THREE TIMES A DAY AS NEEDED FOR ANXIETY     aspirin 81 MG tablet Take 81 mg by mouth every other day.      buPROPion (WELLBUTRIN XL) 300 MG 24 hr tablet Take 300 mg by mouth every morning.     busPIRone (BUSPAR) 15 MG tablet TAKE 1 TABLET BY MOUTH 2 TIMES DAILY. 180 tablet 2   chlorthalidone (HYGROTON) 25 MG tablet TAKE 1 TABLET BY MOUTH EVERY DAY 90 tablet 1   Diclofenac  Sodium (VOLTAREN EX)      ezetimibe (ZETIA) 10 MG tablet TAKE 1 TABLET BY MOUTH EVERY DAY 90 tablet 3   Insulin Pen Needle 31G X 8 MM MISC Use as directed to inject saxenda 100 each 3   Liraglutide -Weight Management (SAXENDA) 18 MG/3ML SOPN Inject 0.6 mg into the skin daily.     metFORMIN (GLUCOPHAGE) 500 MG tablet Take 1 tablet (500 mg total) by mouth 2 (two) times daily with a meal. 180 tablet 1   Turmeric 500 MG CAPS Take 1 capsule by mouth 2 (two) times daily.     VITAMIN D PO Take 1,000 Units by mouth 2 (two) times daily.     zolpidem (AMBIEN) 5 MG tablet Take 5 mg by mouth at bedtime as needed for sleep.     potassium chloride (KLOR-CON) 10 MEQ tablet TAKE 1 TABLET (10 MEQ TOTAL) BY MOUTH EVERY OTHER DAY. 45 tablet 0   No current facility-administered medications on file prior to visit.    BP 120/80    Pulse 100    Resp 18    Ht 5\' 2"  (1.575 m)    Wt 163 lb (73.9 kg)    LMP 02/11/2016 (Exact Date)    SpO2 96%    BMI 29.81 kg/m       Objective:   Physical Exam   General Mental Status- Alert. General Appearance- Not in acute distress.   Skin General: Color- Normal Color. Moisture- Normal Moisture.  Neck Carotid Arteries- Normal color. Moisture- Normal Moisture. No carotid bruits. No JVD.  Chest and Lung Exam Auscultation: Breath Sounds:-Normal.  Cardiovascular Auscultation:Rythm- Regular. Murmurs & Other Heart Sounds:Auscultation of the heart reveals- No Murmurs.  Abdomen Inspection:-Inspeection Normal. Palpation/Percussion:Note:No mass. Palpation and Percussion of the abdomen reveal- Non Tender, Non Distended + BS, no rebound or guarding.   Neurologic Cranial Nerve exam:- CN III-XII intact(No nystagmus), symmetric smile. Strength:- 5/5 equal and symmetric strength both upper and lower extremities.      Assessment & Plan:   Patient Instructions  COVID infection.  Mild symptoms with risk score of 3.  Prior COVID vaccination x3.  Day 2 since symptom onset.   Prescribing molnupiravir antiviral.  Also prescribing Flonase for nasal congestion and benzonatate for cough.  Recommend checking O2 sat daily and make sure above 96%.  If you begin to develop secondary bacterial infection such as sinusitis or bronchitis type signs/symptoms will consider prescribing antibiotic.  If severe chest congestion recommend outpatient chest x-ray.  Preop physical  exam today normal.  You had mentioned surgery is actually going to be in February.  I am placing a future labs and want to get scheduled 10 days prior to the surgery date.  If you have any changing signs symptoms before then they would like to repeat physical exam.  Blood pressure well controlled today.  Follow-up date to be determined depending on how you do clinically with COVID.  As needed as well.   Mackie Pai, PA-C

## 2021-12-16 ENCOUNTER — Encounter: Payer: Self-pay | Admitting: Medical

## 2021-12-16 MED ORDER — ONDANSETRON HCL 4 MG PO TABS: 4.0000 mg | ORAL_TABLET | Freq: Three times a day (TID) | ORAL | 0 refills | Status: DC | PRN

## 2021-12-16 MED ORDER — OMEPRAZOLE 20 MG PO CPDR: 20.0000 mg | DELAYED_RELEASE_CAPSULE | Freq: Every day | ORAL | 3 refills | Status: DC

## 2021-12-16 NOTE — Addendum Note (Signed)
Addended by: Anabel Halon on: 12/16/2021 12:49 PM   Modules accepted: Orders

## 2021-12-19 ENCOUNTER — Other Ambulatory Visit: Payer: Self-pay | Admitting: Medical

## 2022-01-02 ENCOUNTER — Other Ambulatory Visit: Payer: Self-pay | Admitting: Medical

## 2022-01-04 ENCOUNTER — Ambulatory Visit: Payer: BC Managed Care – PPO | Admitting: Medical

## 2022-01-08 ENCOUNTER — Encounter: Payer: Self-pay | Admitting: Medical

## 2022-01-08 ENCOUNTER — Ambulatory Visit (INDEPENDENT_AMBULATORY_CARE_PROVIDER_SITE_OTHER): Payer: BC Managed Care – PPO | Admitting: Medical

## 2022-01-08 VITALS — BP 112/75 | HR 86 | Temp 98.2°F | Resp 18 | Ht 62.0 in | Wt 166.0 lb

## 2022-01-08 DIAGNOSIS — R634 Abnormal weight loss: Secondary | ICD-10-CM

## 2022-01-08 DIAGNOSIS — R739 Hyperglycemia, unspecified: Secondary | ICD-10-CM | POA: Diagnosis not present

## 2022-01-08 DIAGNOSIS — I10 Essential (primary) hypertension: Secondary | ICD-10-CM | POA: Diagnosis not present

## 2022-01-08 DIAGNOSIS — E785 Hyperlipidemia, unspecified: Secondary | ICD-10-CM | POA: Diagnosis not present

## 2022-01-08 DIAGNOSIS — Z01818 Encounter for other preprocedural examination: Secondary | ICD-10-CM

## 2022-01-08 LAB — POCT URINALYSIS DIPSTICK
Bilirubin, UA: NEGATIVE
Blood, UA: NEGATIVE
Glucose, UA: NEGATIVE
Ketones, UA: NEGATIVE
Leukocytes, UA: NEGATIVE
Nitrite, UA: NEGATIVE
Protein, UA: NEGATIVE
Spec Grav, UA: 1.015 (ref 1.010–1.025)
Urobilinogen, UA: 0.2 E.U./dL
pH, UA: 5 (ref 5.0–8.0)

## 2022-01-08 MED ORDER — METHOCARBAMOL 500 MG PO TABS: 500.0000 mg | ORAL_TABLET | Freq: Three times a day (TID) | ORAL | 0 refills | Status: DC | PRN

## 2022-01-08 NOTE — Progress Notes (Signed)
Subjective:    Patient ID: Ann Frederick, female    DOB: 12-23-1971, 51 y.o.   MRN: [id number]  HPI    Pt in for pre-op evaluation for surgery Feb 15, 2022. Will be rt total hip surgery. Pt now using cane for over a month.    Pt had ekg done today.    Pt had prior surgeries with no complications. Has handled anesthesia well.  Recent rt hip pain is hurting a lot over past month. Muscles around hip feels very tight.   Review of Systems  Constitutional:  Negative for chills and fatigue.  Respiratory:  Negative for cough, chest tightness, shortness of breath and wheezing.   Cardiovascular:  Negative for chest pain and palpitations.  Gastrointestinal:  Negative for abdominal pain.  Musculoskeletal:        Rt hip pain.  Skin:  Negative for rash.  Neurological:  Negative for dizziness, speech difficulty, weakness, numbness and headaches.  Hematological:  Negative for adenopathy. Does not bruise/bleed easily.  Psychiatric/Behavioral:  Negative for behavioral problems and confusion.     Past Medical History:  Diagnosis Date   Acne    Anemia    history of   Anxiety    Arthritis    Depression    GERD (gastroesophageal reflux disease)    worse while pregnant   Headache    Migraines   Heart murmur    ? heart murmur in past   History of blood transfusion 2013   Hypertension    Metabolic syndrome      Social History   Socioeconomic History   Marital status: Married    Spouse name: Not on file   Number of children: 2   Years of education: Not on file   Highest education level: Not on file  Occupational History   Not on file  Tobacco Use   Smoking status: Never   Smokeless tobacco: Never  Substance and Sexual Activity   Alcohol use: Yes    Comment: 1 glass of wine per week prior to preg   Drug use: No   Sexual activity: Yes  Other Topics Concern   Not on file  Social History Narrative   Not on file   Social Determinants of Health   Financial Resource Strain:  Not on file  Food Insecurity: Not on file  Transportation Needs: Not on file  Physical Activity: Not on file  Stress: Not on file  Social Connections: Not on file  Intimate Partner Violence: Not on file    Past Surgical History:  Procedure Laterality Date   ANTERIOR FUSION CERVICAL SPINE  2018   CESAREAN SECTION     x 2   CESAREAN SECTION  09/29/2012   Procedure: CESAREAN SECTION;  Surgeon: Luz Lex, MD;  Location: [facility];  Service: Obstetrics;  Laterality: N/A;  Repeat edc 10/12/12/REQUEST;Chassity,Dee,Colleen   COLONOSCOPY     ESOPHAGOGASTRODUODENOSCOPY  normal    7/12   JOINT REPLACEMENT     LAPAROSCOPIC VAGINAL HYSTERECTOMY WITH SALPINGECTOMY Bilateral 02/23/2016   Procedure: LAPAROSCOPIC ASSISTED VAGINAL HYSTERECTOMY WITH bilateral SALPINGECTOMY, right oophorectomy. laporotic repair of incidental cystotomy.;  Surgeon: Louretta Shorten, MD;  Location: [facility];  Service: Gynecology;  Laterality: Bilateral;   MOUTH SURGERY      Family History  Problem Relation Age of Onset   Cancer Father        ? lung CA   Kidney disease Father    Depression Mother    Hypertension Mother  Diabetes Mother    Obesity Mother    Thyroid disease Mother    Hydrocephalus Sister    Colon cancer Neg Hx     Allergies  Allergen Reactions   Azithromycin Nausea And Vomiting    diarrhea    Current Outpatient Medications on File Prior to Visit  Medication Sig Dispense Refill   ALPRAZolam (XANAX) 0.5 MG tablet alprazolam 0.5 mg tablet  TAKE 1 TABLET BY MOUTH THREE TIMES A DAY AS NEEDED FOR ANXIETY     aspirin 81 MG tablet Take 81 mg by mouth every other day.      benzonatate (TESSALON) 100 MG capsule Take 1 capsule (100 mg total) by mouth 3 (three) times daily as needed for cough. 30 capsule 0   buPROPion (WELLBUTRIN XL) 300 MG 24 hr tablet Take 300 mg by mouth every morning.     busPIRone (BUSPAR) 15 MG tablet TAKE 1 TABLET BY MOUTH 2 TIMES DAILY. 180 tablet 2   chlorthalidone (HYGROTON) 25 MG  tablet TAKE 1 TABLET BY MOUTH EVERY DAY 90 tablet 1   Diclofenac Sodium (VOLTAREN EX)      ezetimibe (ZETIA) 10 MG tablet TAKE 1 TABLET BY MOUTH EVERY DAY 90 tablet 3   fluticasone (FLONASE) 50 MCG/ACT nasal spray SPRAY 2 SPRAYS INTO EACH NOSTRIL EVERY DAY 16 mL 1   Insulin Pen Needle 31G X 8 MM MISC Use as directed to inject saxenda 100 each 3   metFORMIN (GLUCOPHAGE) 500 MG tablet Take 1 tablet (500 mg total) by mouth 2 (two) times daily with a meal. 180 tablet 1   omeprazole (PRILOSEC) 20 MG capsule Take 1 capsule (20 mg total) by mouth daily. 30 capsule 3   ondansetron (ZOFRAN) 4 MG tablet Take 1 tablet (4 mg total) by mouth every 8 (eight) hours as needed for nausea or vomiting. 20 tablet 0   potassium chloride (KLOR-CON) 10 MEQ tablet TAKE 1 TABLET (10 MEQ TOTAL) BY MOUTH EVERY OTHER DAY. 45 tablet 0   SAXENDA 18 MG/3ML SOPN INJECT 3 MG INTO THE SKIN DAILY. 3 mL 2   Turmeric 500 MG CAPS Take 1 capsule by mouth 2 (two) times daily.     VITAMIN D PO Take 1,000 Units by mouth 2 (two) times daily.     zolpidem (AMBIEN) 5 MG tablet Take 5 mg by mouth at bedtime as needed for sleep.     No current facility-administered medications on file prior to visit.    BP 108/76    Pulse 86    Temp 98.2 F (36.8 C)    Resp 18    Ht 5\' 2"  (1.575 m)    Wt 166 lb (75.3 kg)    LMP 02/11/2016 (Exact Date)    SpO2 100%    BMI 30.36 kg/m       Objective:   Physical Exam  General Mental Status- Alert. General Appearance- Not in acute distress.    Skin General: Color- Normal Color. Moisture- Normal Moisture.   Neck Carotid Arteries- Normal color. Moisture- Normal Moisture. No carotid bruits. No JVD.   Chest and Lung Exam Auscultation: Breath Sounds:-Normal.   Cardiovascular Auscultation:Rythm- Regular. Murmurs & Other Heart Sounds:Auscultation of the heart reveals- No Murmurs.   Abdomen Inspection:-Inspeection Normal. Palpation/Percussion:Note:No mass. Palpation and Percussion of the abdomen  reveal- Non Tender, Non Distended + BS, no rebound or guarding.   Neurologic Cranial Nerve exam:- CN III-XII intact(No nystagmus), symmetric smile. Strength:- 5/5 equal and symmetric strength both upper and lower extremities.  Rt hip pain- pain on range of motion.     Assessment & Plan:    Patient Instructions  You have normal physical exam today. ekg showed nsr.   Will get requested pre-op labs. Then fill out pre-op evaluation form.   Expect to have paperwork filled out by middle of next week.  Labs placed as future to get done next week. Please get scheduled.  For elevated sugar continue metformin.  Htn- continue chlorthalidone.  For wt loss continue saxenda.  For high cholesterol continue zetia.   Follow up date to be determined.    Mackie Pai, PA-C

## 2022-01-08 NOTE — Addendum Note (Signed)
Addended by: Anabel Halon on: 01/08/2022 04:04 PM   Modules accepted: Orders

## 2022-01-08 NOTE — Patient Instructions (Addendum)
You have normal physical exam today. ekg showed nsr.   Will get requested pre-op labs. Then fill out pre-op evaluation form.   Expect to have paperwork filled out by middle of next week.  Labs placed as future to get done next week. Please get scheduled.  For elevated sugar continue metformin.  Htn- continue chlorthalidone.  For wt loss continue saxenda.  For high cholesterol continue zetia.  For hip muscle tightness rx robaxin.    Follow up date to be determined.

## 2022-01-15 ENCOUNTER — Other Ambulatory Visit (INDEPENDENT_AMBULATORY_CARE_PROVIDER_SITE_OTHER): Payer: BC Managed Care – PPO

## 2022-01-15 DIAGNOSIS — R739 Hyperglycemia, unspecified: Secondary | ICD-10-CM | POA: Diagnosis not present

## 2022-01-15 DIAGNOSIS — Z01818 Encounter for other preprocedural examination: Secondary | ICD-10-CM

## 2022-01-15 DIAGNOSIS — E785 Hyperlipidemia, unspecified: Secondary | ICD-10-CM | POA: Diagnosis not present

## 2022-01-15 LAB — LIPID PANEL
Cholesterol: 220 mg/dL — ABNORMAL HIGH (ref 0–200)
HDL: 55.7 mg/dL (ref >39.00)
LDL Cholesterol: 131 mg/dL — ABNORMAL HIGH (ref 0–99)
NonHDL: 164.4
Total CHOL/HDL Ratio: 4
Triglycerides: 169 mg/dL — ABNORMAL HIGH (ref 0.0–149.0)
VLDL: 33.8 mg/dL (ref 0.0–40.0)

## 2022-01-15 LAB — COMPREHENSIVE METABOLIC PANEL
ALT: 14 U/L (ref 0–35)
AST: 15 U/L (ref 0–37)
Albumin: 4.5 g/dL (ref 3.5–5.2)
Alkaline Phosphatase: 48 U/L (ref 39–117)
BUN: 12 mg/dL (ref 6–23)
CO2: 33 mEq/L — ABNORMAL HIGH (ref 19–32)
Calcium: 10 mg/dL (ref 8.4–10.5)
Chloride: 97 mEq/L (ref 96–112)
Creatinine, Ser: 1.13 mg/dL (ref 0.40–1.20)
GFR: 56.82 mL/min — ABNORMAL LOW (ref >60.00)
Glucose, Bld: 86 mg/dL (ref 70–99)
Potassium: 3.7 mEq/L (ref 3.5–5.1)
Sodium: 138 mEq/L (ref 135–145)
Total Bilirubin: 0.6 mg/dL (ref 0.2–1.2)
Total Protein: 7.2 g/dL (ref 6.0–8.3)

## 2022-01-15 LAB — CBC WITH DIFFERENTIAL/PLATELET
Basophils Absolute: 0.1 10*3/uL (ref 0.0–0.1)
Basophils Relative: 0.9 % (ref 0.0–3.0)
Eosinophils Absolute: 0.2 10*3/uL (ref 0.0–0.7)
Eosinophils Relative: 2.3 % (ref 0.0–5.0)
HCT: 39.4 % (ref 36.0–46.0)
Hemoglobin: 12.9 g/dL (ref 12.0–15.0)
Lymphocytes Relative: 32 % (ref 12.0–46.0)
Lymphs Abs: 3.2 10*3/uL (ref 0.7–4.0)
MCHC: 32.7 g/dL (ref 30.0–36.0)
MCV: 92.8 fl (ref 78.0–100.0)
Monocytes Absolute: 0.6 10*3/uL (ref 0.1–1.0)
Monocytes Relative: 5.7 % (ref 3.0–12.0)
Neutro Abs: 5.8 10*3/uL (ref 1.4–7.7)
Neutrophils Relative %: 59.1 % (ref 43.0–77.0)
Platelets: 400 10*3/uL (ref 150.0–400.0)
RBC: 4.24 Mil/uL (ref 3.87–5.11)
RDW: 13.9 % (ref 11.5–15.5)
WBC: 9.9 10*3/uL (ref 4.0–10.5)

## 2022-01-15 LAB — HEMOGLOBIN A1C: Hgb A1c MFr Bld: 5 % (ref 4.6–6.5)

## 2022-01-15 LAB — PROTIME-INR
INR: 0.9 ratio (ref 0.8–1.0)
Prothrombin Time: 10.1 s (ref 9.6–13.1)

## 2022-01-16 ENCOUNTER — Other Ambulatory Visit: Payer: Self-pay | Admitting: Medical

## 2022-01-18 ENCOUNTER — Telehealth: Payer: Self-pay | Admitting: Medical

## 2022-01-18 NOTE — Telephone Encounter (Signed)
Pre-op risk evaluation form signed. Placing on your desk to fax.  Thanks,

## 2022-01-18 NOTE — Telephone Encounter (Signed)
Forms faxed to sherry w

## 2022-01-21 ENCOUNTER — Other Ambulatory Visit: Payer: Self-pay | Admitting: Medical

## 2022-01-30 ENCOUNTER — Other Ambulatory Visit: Payer: Self-pay | Admitting: Medical

## 2022-02-15 DIAGNOSIS — M1611 Unilateral primary osteoarthritis, right hip: Secondary | ICD-10-CM | POA: Diagnosis not present

## 2022-02-19 ENCOUNTER — Other Ambulatory Visit: Payer: Self-pay

## 2022-02-19 ENCOUNTER — Encounter (HOSPITAL_BASED_OUTPATIENT_CLINIC_OR_DEPARTMENT_OTHER): Payer: Self-pay

## 2022-02-19 ENCOUNTER — Emergency Department (HOSPITAL_BASED_OUTPATIENT_CLINIC_OR_DEPARTMENT_OTHER)
Admission: EM | Admit: 2022-02-19 | Discharge: 2022-02-20 | Disposition: A | Discharge status: Home / Self Care | Payer: BC Managed Care – PPO | Attending: Emergency Medicine | Admitting: Emergency Medicine

## 2022-02-19 DIAGNOSIS — I10 Essential (primary) hypertension: Secondary | ICD-10-CM | POA: Diagnosis not present

## 2022-02-19 DIAGNOSIS — E876 Hypokalemia: Secondary | ICD-10-CM | POA: Insufficient documentation

## 2022-02-19 DIAGNOSIS — R42 Dizziness and giddiness: Secondary | ICD-10-CM | POA: Diagnosis not present

## 2022-02-19 DIAGNOSIS — D649 Anemia, unspecified: Secondary | ICD-10-CM | POA: Diagnosis not present

## 2022-02-19 DIAGNOSIS — R739 Hyperglycemia, unspecified: Secondary | ICD-10-CM | POA: Insufficient documentation

## 2022-02-19 DIAGNOSIS — R202 Paresthesia of skin: Secondary | ICD-10-CM | POA: Insufficient documentation

## 2022-02-19 LAB — CBG MONITORING, ED: Glucose-Capillary: 116 mg/dL — ABNORMAL HIGH (ref 70–99)

## 2022-02-19 MED ORDER — MECLIZINE HCL 25 MG PO TABS
25.0000 mg | ORAL_TABLET | Freq: Once | ORAL | Status: AC
  Administered 2022-02-19: 25 mg via ORAL
  Filled 2022-02-19: qty 1

## 2022-02-19 NOTE — ED Notes (Signed)
Initial NIH score 0.

## 2022-02-19 NOTE — ED Triage Notes (Signed)
Patient reports L facial "fullness" "stiffness" and tingling which began around 2230 tonight.  She complains of tingling to L cheek bone and L tongue.  Patient also complains of episodes of dizziness which began around 1400 today.  States she felt nausea in lobby.  Denies headache.

## 2022-02-20 ENCOUNTER — Emergency Department (HOSPITAL_BASED_OUTPATIENT_CLINIC_OR_DEPARTMENT_OTHER): Payer: BC Managed Care – PPO

## 2022-02-20 DIAGNOSIS — R202 Paresthesia of skin: Secondary | ICD-10-CM | POA: Diagnosis not present

## 2022-02-20 LAB — COMPREHENSIVE METABOLIC PANEL
ALT: 18 U/L (ref 0–44)
AST: 21 U/L (ref 15–41)
Albumin: 3.1 g/dL — ABNORMAL LOW (ref 3.5–5.0)
Alkaline Phosphatase: 51 U/L (ref 38–126)
Anion gap: 7 (ref 5–15)
BUN: 12 mg/dL (ref 6–20)
CO2: 32 mmol/L (ref 22–32)
Calcium: 8.8 mg/dL — ABNORMAL LOW (ref 8.9–10.3)
Chloride: 97 mmol/L — ABNORMAL LOW (ref 98–111)
Creatinine, Ser: 0.89 mg/dL (ref 0.44–1.00)
GFR, Estimated: >60 mL/min (ref >60)
Glucose, Bld: 124 mg/dL — ABNORMAL HIGH (ref 70–99)
Potassium: 2.9 mmol/L — ABNORMAL LOW (ref 3.5–5.1)
Sodium: 136 mmol/L (ref 135–145)
Total Bilirubin: 0.5 mg/dL (ref 0.3–1.2)
Total Protein: 6.7 g/dL (ref 6.5–8.1)

## 2022-02-20 LAB — CBC WITH DIFFERENTIAL/PLATELET
Abs Immature Granulocytes: 0.05 10*3/uL (ref 0.00–0.07)
Basophils Absolute: 0.1 10*3/uL (ref 0.0–0.1)
Basophils Relative: 1 %
Eosinophils Absolute: 0.3 10*3/uL (ref 0.0–0.5)
Eosinophils Relative: 3 %
HCT: 30.5 % — ABNORMAL LOW (ref 36.0–46.0)
Hemoglobin: 10.2 g/dL — ABNORMAL LOW (ref 12.0–15.0)
Immature Granulocytes: 0 %
Lymphocytes Relative: 26 %
Lymphs Abs: 3 10*3/uL (ref 0.7–4.0)
MCH: 31.1 pg (ref 26.0–34.0)
MCHC: 33.4 g/dL (ref 30.0–36.0)
MCV: 93 fL (ref 80.0–100.0)
Monocytes Absolute: 0.7 10*3/uL (ref 0.1–1.0)
Monocytes Relative: 6 %
Neutro Abs: 7.4 10*3/uL (ref 1.7–7.7)
Neutrophils Relative %: 64 %
Platelets: 450 10*3/uL — ABNORMAL HIGH (ref 150–400)
RBC: 3.28 MIL/uL — ABNORMAL LOW (ref 3.87–5.11)
RDW: 12.9 % (ref 11.5–15.5)
WBC: 11.5 10*3/uL — ABNORMAL HIGH (ref 4.0–10.5)
nRBC: 0 % (ref 0.0–0.2)

## 2022-02-20 LAB — MAGNESIUM: Magnesium: 1.8 mg/dL (ref 1.7–2.4)

## 2022-02-20 LAB — HCG, SERUM, QUALITATIVE: Preg, Serum: NEGATIVE

## 2022-02-20 MED ORDER — POTASSIUM CHLORIDE CRYS ER 20 MEQ PO TBCR: 40.0000 meq | EXTENDED_RELEASE_TABLET | Freq: Every day | ORAL | 0 refills | Status: DC

## 2022-02-20 MED ORDER — IOHEXOL 350 MG/ML SOLN
75.0000 mL | Freq: Once | INTRAVENOUS | Status: AC | PRN
  Administered 2022-02-20: 75 mL via INTRAVENOUS

## 2022-02-20 MED ORDER — MECLIZINE HCL 25 MG PO TABS: 25.0000 mg | ORAL_TABLET | Freq: Three times a day (TID) | ORAL | 0 refills | Status: DC | PRN

## 2022-02-20 NOTE — ED Provider Notes (Signed)
Emergency Department Provider Note   I have reviewed the triage vital signs and the nursing notes.   HISTORY  Chief Complaint Dizziness   HPI Ann Frederick is a 51 y.o. female presents to the ED with left face tingling and "fullness" along with dizzy/vertigo feeling. Symptoms began at 14:00 today. No weakness. No arm/leg symptoms. Patient is not having a HA. No vision or speech changes.    Past Medical History:  Diagnosis Date   Acne    Anemia    history of   Anxiety    Arthritis    Depression    GERD (gastroesophageal reflux disease)    worse while pregnant   Headache    Migraines   Heart murmur    ? heart murmur in past   History of blood transfusion 2013   Hypertension    Metabolic syndrome     Review of Systems  Constitutional: No fever/chills Eyes: No visual changes. ENT: No sore throat. Positive vertigo.  Cardiovascular: Denies chest pain. Respiratory: Denies shortness of breath. Gastrointestinal: No abdominal pain.  No nausea, no vomiting.  No diarrhea.  No constipation. Genitourinary: Negative for dysuria. Musculoskeletal: Negative for back pain. Skin: Negative for rash. Neurological: Negative for headaches, focal weakness or numbness. Positive tingling to the left face.    ____________________________________________   PHYSICAL EXAM:  VITAL SIGNS: ED Triage Vitals [02/19/22 2334]  Enc Vitals Group     BP 126/83     Pulse Rate 99     Resp 20     Temp 98.1 F (36.7 C)     Temp Source Oral     SpO2 100 %     Weight 162 lb (73.5 kg)     Height 5\' 2"  (1.575 m)   Constitutional: Alert and oriented. Well appearing and in no acute distress. Eyes: Conjunctivae are normal. PERRL. EOMI. No nystagmus.  Head: Atraumatic. Nose: No congestion/rhinnorhea. Mouth/Throat: Mucous membranes are moist.  Oropharynx non-erythematous. Neck: No stridor.   Cardiovascular: Normal rate, regular rhythm. Good peripheral circulation. Grossly normal heart sounds.    Respiratory: Normal respiratory effort.  No retractions. Lungs CTAB. Gastrointestinal: Soft and nontender. No distention.  Musculoskeletal: No lower extremity tenderness nor edema. No gross deformities of extremities. Neurologic:  Normal speech and language. No gross focal neurologic deficits are appreciated. No facial weakness or numbness. Normal finger to nose and heel to shin testing.  Skin:  Skin is warm, dry and intact. No rash noted.   ____________________________________________   LABS (all labs ordered are listed, but only abnormal results are displayed)  Labs Reviewed  COMPREHENSIVE METABOLIC PANEL - Abnormal; Notable for the following components:      Result Value   Potassium 2.9 (*)    Chloride 97 (*)    Glucose, Bld 124 (*)    Calcium 8.8 (*)    Albumin 3.1 (*)    All other components within normal limits  CBC WITH DIFFERENTIAL/PLATELET - Abnormal; Notable for the following components:   WBC 11.5 (*)    RBC 3.28 (*)    Hemoglobin 10.2 (*)    HCT 30.5 (*)    Platelets 450 (*)    All other components within normal limits  CBG MONITORING, ED - Abnormal; Notable for the following components:   Glucose-Capillary 116 (*)    All other components within normal limits  MAGNESIUM  HCG, SERUM, QUALITATIVE   ____________________________________________  EKG   EKG Interpretation  Date/Time:  Friday February 19 2022 23:32:04 EST  Ventricular Rate:  98 PR Interval:  150 QRS Duration: 68 QT Interval:  334 QTC Calculation: 426 R Axis:   62 Text Interpretation: Normal sinus rhythm Normal ECG When compared with ECG of 22-Sep-2020 14:10, PREVIOUS ECG IS PRESENT Confirmed by Nanda Quinton [phone]) on 02/19/2022 11:37:47 PM       ____________________________________________   PROCEDURES  Procedure(s) performed:   Procedures  None ____________________________________________   INITIAL IMPRESSION / ASSESSMENT AND PLAN / ED COURSE  Pertinent labs & imaging results  that were available during my care of the patient were reviewed by me and considered in my medical decision making (see chart for details).   This patient is Presenting for Evaluation of tingling, which does require a range of treatment options, and is a complaint that involves a high risk of morbidity and mortality.  The Differential Diagnoses includes but is not exclusive to alcohol, illicit or prescription medications, intracranial pathology such as stroke, intracerebral hemorrhage, fever or infectious causes including sepsis, hypoxemia, uremia, trauma, endocrine related disorders such as diabetes, hypoglycemia, thyroid-related diseases, etc.   Critical Interventions- PO antivert   Medications  meclizine (ANTIVERT) tablet 25 mg (25 mg Oral Given 02/19/22 2351)  iohexol (OMNIPAQUE) 350 MG/ML injection 75 mL (75 mLs Intravenous Contrast Given 02/20/22 0108)    Reassessment after intervention: Vertigo improved.    I decided to review pertinent External Data, and in summary no recent ED visits for similar.   Clinical Laboratory Tests Ordered, included CMP with K slightly low at 2.9. Normal Mg. Pregnancy negative. Mild anemia.   Radiologic Tests Ordered, included CTA head and neck. I independently interpreted the images and agree with radiology interpretation.   Cardiac Monitor Tracing which shows NSR.   Social Determinants of Health Risk patient is a non-smoker.   Medical Decision Making: Summary:  Patient presents to the ED with tingling to the face without focal neuro deficits. K is low and symptoms resolved with meclizine. Doubt central vertigo process. CTA interpreted as above. Plan to replace K with PCP follow up and meclizine for home. Discussed ED return precautions.   Reevaluation with update and discussion with patient regarding results and plan at discharge. She is feeling well at discharge.   Disposition: discharge  ____________________________________________  FINAL  CLINICAL IMPRESSION(S) / ED DIAGNOSES  Final diagnoses:  Vertigo  Paresthesias  Hypokalemia     NEW OUTPATIENT MEDICATIONS STARTED DURING THIS VISIT:  Discharge Medication List as of 02/20/2022  2:41 AM     START taking these medications   Details  meclizine (ANTIVERT) 25 MG tablet Take 1 tablet (25 mg total) by mouth 3 (three) times daily as needed for dizziness., Starting Sat 02/20/2022, Normal    potassium chloride SA (KLOR-CON M) 20 MEQ tablet Take 2 tablets (40 mEq total) by mouth daily for 4 days., Starting Sat 02/20/2022, Until Wed 02/24/2022, Normal        Note:  This document was prepared using Dragon voice recognition software and may include unintentional dictation errors.  Nanda Quinton, MD, [facility]    Terran Klinke, [facility], MD 02/24/22 1228

## 2022-02-20 NOTE — ED Notes (Signed)
Pt A&OX4, ambulatory at d/c but wheeled out of ED via wheelchair, accompanied by family. Pt verbalized understanding of d/c instructions, prescriptions and follow up care.

## 2022-02-20 NOTE — Discharge Instructions (Signed)
You were seen in the emergency room today with dizziness and tingling.  Your potassium is slightly low and I suspect that your dizziness is coming from an inner ear issue.  I have provided medications to help with both your potassium and your vertigo symptoms.  Please take as directed and follow with your primary care doctor in the coming week for repeat blood work to make sure your potassium is returned to normal.  If you develop sudden worsening symptoms such as numbness/weakness, vision change, speech disturbance, or other sudden/severe symptoms please call 911 or return to the emergency department for reevaluation.

## 2022-02-20 NOTE — ED Notes (Signed)
Patient transported to CT 

## 2022-02-20 NOTE — ED Notes (Signed)
Pt reports the tingling has went away as well as the neck stiffness. She reports it just feels like "heat" on the left side of her neck.

## 2022-03-06 ENCOUNTER — Other Ambulatory Visit: Payer: Self-pay | Admitting: Medical

## 2022-03-13 ENCOUNTER — Other Ambulatory Visit: Payer: Self-pay | Admitting: Medical

## 2022-03-22 ENCOUNTER — Other Ambulatory Visit: Payer: Self-pay | Admitting: Medical

## 2022-03-31 DIAGNOSIS — Z4789 Encounter for other orthopedic aftercare: Secondary | ICD-10-CM | POA: Diagnosis not present

## 2022-04-23 ENCOUNTER — Other Ambulatory Visit: Payer: Self-pay | Admitting: Medical

## 2022-05-04 DIAGNOSIS — F4312 Post-traumatic stress disorder, chronic: Secondary | ICD-10-CM | POA: Diagnosis not present

## 2022-05-04 DIAGNOSIS — Z1339 Encounter for screening examination for other mental health and behavioral disorders: Secondary | ICD-10-CM | POA: Diagnosis not present

## 2022-05-04 DIAGNOSIS — F332 Major depressive disorder, recurrent severe without psychotic features: Secondary | ICD-10-CM | POA: Diagnosis not present

## 2022-06-18 ENCOUNTER — Other Ambulatory Visit: Payer: Self-pay | Admitting: Medical

## 2022-06-23 ENCOUNTER — Other Ambulatory Visit: Payer: Self-pay | Admitting: Medical

## 2022-06-23 DIAGNOSIS — E6609 Other obesity due to excess calories: Secondary | ICD-10-CM

## 2022-07-19 ENCOUNTER — Other Ambulatory Visit: Payer: Self-pay | Admitting: Medical

## 2022-08-11 ENCOUNTER — Encounter: Payer: Self-pay | Admitting: Nurse Practitioner

## 2022-08-13 NOTE — Patient Instructions (Incomplete)
Good to see you today- I hope you are feeling better soon!  Let me know if the pain is getting worse again or if not continuing to improve  If necessary we can remove the blood clot- otherwise keep your stool soft, tub soaks can help with pain and swelling

## 2022-08-13 NOTE — Progress Notes (Unsigned)
[facility] [address], [place], [place] [address] [phone] [id number]  Date:  08/16/2022   Name:  Ann Frederick   DOB:  [birth year]   MRN:  [id number]  PCP:  Mackie Pai, PA-C    Chief Complaint: No chief complaint on file.   History of Present Illness:  Ann Frederick is a 51 y.o. very pleasant female patient who presents with the following:  Pt seen today with concern of a hemorrhoid Last seen by myself last year with a skin issue- her PCP is [employer].     Patient Active Problem List   Diagnosis Date Noted   Left hip pain 08/27/2019   Class 1 obesity due to excess calories without serious comorbidity with body mass index (BMI) of 34.0 to 34.9 in adult 11/28/2018   Heart palpitations 11/28/2018   OSA (obstructive sleep apnea) 09/14/2018   Neck pain 04/26/2018   Wellness examination 03/08/2016   S/P laparoscopic assisted vaginal hysterectomy (LAVH) 02/23/2016   Pink eye 06/19/2015   Pain of right thumb 06/19/2015   Sinusitis, acute frontal 02/05/2015   Headache around the eyes 02/05/2015   Back pain 11/01/2014   Routine general medical examination at a health care facility 10/30/2013   Vesicular rash 09/07/2013   Sinusitis 02/02/2013   Viral pharyngitis 03/03/2012   Flank pain 08/13/2011   Inguinal adenopathy 08/13/2011   Dysphagia 04/20/2011   GERD 01/29/2011   FATIGUE 01/29/2011   SCIATICA, BILATERAL 06/18/2010   LEG PAIN, BILATERAL 06/06/2010   HIP PAIN, LEFT 04/01/2010   MASS, LOCALIZED, SUPERFICIAL 03/05/2009   ACNE VULGARIS 07/11/2007   MURMUR 07/11/2007    Past Medical History:  Diagnosis Date   Acne    Anemia    history of   Anxiety    Arthritis    Depression    GERD (gastroesophageal reflux disease)    worse while pregnant   Headache    Migraines   Heart murmur    ? heart murmur in past   History of blood transfusion 2013   Hypertension    Metabolic syndrome     Past  Surgical History:  Procedure Laterality Date   ANTERIOR FUSION CERVICAL SPINE  2018   CESAREAN SECTION     x 2   CESAREAN SECTION  09/29/2012   Procedure: CESAREAN SECTION;  Surgeon: Luz Lex, MD;  Location: [facility];  Service: Obstetrics;  Laterality: N/A;  Repeat edc 10/12/12/REQUEST;Chassity,Dee,Colleen   COLONOSCOPY     ESOPHAGOGASTRODUODENOSCOPY  normal    7/12   JOINT REPLACEMENT     LAPAROSCOPIC VAGINAL HYSTERECTOMY WITH SALPINGECTOMY Bilateral 02/23/2016   Procedure: LAPAROSCOPIC ASSISTED VAGINAL HYSTERECTOMY WITH bilateral SALPINGECTOMY, right oophorectomy. laporotic repair of incidental cystotomy.;  Surgeon: Louretta Shorten, MD;  Location: [facility];  Service: Gynecology;  Laterality: Bilateral;   MOUTH SURGERY      Social History   Tobacco Use   Smoking status: Never   Smokeless tobacco: Never  Vaping Use   Vaping Use: Never used  Substance Use Topics   Alcohol use: Yes    Comment: 1 glass of wine per week prior to preg   Drug use: No    Family History  Problem Relation Age of Onset   Cancer Father        ? lung CA   Kidney disease Father    Depression Mother    Hypertension Mother    Diabetes Mother  Obesity Mother    Thyroid disease Mother    Hydrocephalus Sister    Colon cancer Neg Hx     Allergies  Allergen Reactions   Azithromycin Nausea And Vomiting    diarrhea    Medication list has been reviewed and updated.  Current Outpatient Medications on File Prior to Visit  Medication Sig Dispense Refill   ALPRAZolam (XANAX) 0.5 MG tablet alprazolam 0.5 mg tablet  TAKE 1 TABLET BY MOUTH THREE TIMES A DAY AS NEEDED FOR ANXIETY     aspirin 81 MG tablet Take 81 mg by mouth every other day.      benzonatate (TESSALON) 100 MG capsule Take 1 capsule (100 mg total) by mouth 3 (three) times daily as needed for cough. 30 capsule 0   buPROPion (WELLBUTRIN XL) 300 MG 24 hr tablet Take 300 mg by mouth every morning.     busPIRone (BUSPAR) 15 MG tablet TAKE 1 TABLET BY  MOUTH 2 TIMES DAILY. 180 tablet 2   chlorthalidone (HYGROTON) 25 MG tablet TAKE 1 TABLET BY MOUTH EVERY DAY 90 tablet 1   Diclofenac Sodium (VOLTAREN EX)      ezetimibe (ZETIA) 10 MG tablet TAKE 1 TABLET BY MOUTH EVERY DAY 90 tablet 3   fluticasone (FLONASE) 50 MCG/ACT nasal spray SPRAY 2 SPRAYS INTO EACH NOSTRIL EVERY DAY 16 mL 1   Insulin Pen Needle (B-D ULTRAFINE III SHORT PEN) 31G X 8 MM MISC USE AS DIRECTED TO INJECT SAXENDA 100 each 3   meclizine (ANTIVERT) 25 MG tablet Take 1 tablet (25 mg total) by mouth 3 (three) times daily as needed for dizziness. 30 tablet 0   metFORMIN (GLUCOPHAGE) 500 MG tablet TAKE 1 TABLET BY MOUTH 2 TIMES DAILY WITH A MEAL. 180 tablet 0   methocarbamol (ROBAXIN) 500 MG tablet Take 1 tablet (500 mg total) by mouth every 8 (eight) hours as needed for muscle spasms. 30 tablet 0   omeprazole (PRILOSEC) 20 MG capsule TAKE 1 CAPSULE BY MOUTH EVERY DAY 90 capsule 1   ondansetron (ZOFRAN) 4 MG tablet Take 1 tablet (4 mg total) by mouth every 8 (eight) hours as needed for nausea or vomiting. 20 tablet 0   potassium chloride (KLOR-CON) 10 MEQ tablet TAKE 1 TABLET (10 MEQ TOTAL) BY MOUTH EVERY OTHER DAY. 45 tablet 0   potassium chloride SA (KLOR-CON M) 20 MEQ tablet Take 2 tablets (40 mEq total) by mouth daily for 4 days. 8 tablet 0   SAXENDA 18 MG/3ML SOPN INJECT 3 MG INTO THE SKIN DAILY. 3 mL 2   Turmeric 500 MG CAPS Take 1 capsule by mouth 2 (two) times daily.     VITAMIN D PO Take 1,000 Units by mouth 2 (two) times daily.     zolpidem (AMBIEN) 5 MG tablet Take 5 mg by mouth at bedtime as needed for sleep.     No current facility-administered medications on file prior to visit.    Review of Systems:  As per HPI- otherwise negative.   Physical Examination: There were no vitals filed for this visit. There were no vitals filed for this visit. There is no height or weight on file to calculate BMI. Ideal Body Weight:    GEN: no acute distress. HEENT: Atraumatic,  Normocephalic.  Ears and Nose: No external deformity. CV: RRR, No M/G/R. No JVD. No thrill. No extra heart sounds. PULM: CTA B, no wheezes, crackles, rhonchi. No retractions. No resp. distress. No accessory muscle use. ABD: S, NT, ND, +BS. No  rebound. No HSM. EXTR: No c/c/e PSYCH: Normally interactive. Conversant.    Assessment and Plan: ***  Signed Lamar Blinks, MD

## 2022-08-16 ENCOUNTER — Ambulatory Visit (INDEPENDENT_AMBULATORY_CARE_PROVIDER_SITE_OTHER): Payer: BC Managed Care – PPO | Admitting: Family Medicine

## 2022-08-16 VITALS — BP 122/80 | HR 93 | Temp 97.7°F | Resp 18 | Ht 62.0 in

## 2022-08-16 DIAGNOSIS — K645 Perianal venous thrombosis: Secondary | ICD-10-CM | POA: Diagnosis not present

## 2022-08-18 ENCOUNTER — Ambulatory Visit: Payer: BC Managed Care – PPO | Admitting: Family Medicine

## 2022-08-26 DIAGNOSIS — Z6830 Body mass index (BMI) 30.0-30.9, adult: Secondary | ICD-10-CM | POA: Diagnosis not present

## 2022-08-26 DIAGNOSIS — Z1231 Encounter for screening mammogram for malignant neoplasm of breast: Secondary | ICD-10-CM | POA: Diagnosis not present

## 2022-08-26 DIAGNOSIS — Z01419 Encounter for gynecological examination (general) (routine) without abnormal findings: Secondary | ICD-10-CM | POA: Diagnosis not present

## 2022-09-01 ENCOUNTER — Ambulatory Visit (INDEPENDENT_AMBULATORY_CARE_PROVIDER_SITE_OTHER): Payer: BC Managed Care – PPO | Admitting: Gastroenterology

## 2022-09-01 ENCOUNTER — Encounter: Payer: Self-pay | Admitting: Gastroenterology

## 2022-09-01 ENCOUNTER — Other Ambulatory Visit: Payer: Self-pay

## 2022-09-01 VITALS — BP 110/70 | HR 86 | Ht 62.0 in | Wt 167.4 lb

## 2022-09-01 DIAGNOSIS — K644 Residual hemorrhoidal skin tags: Secondary | ICD-10-CM | POA: Diagnosis not present

## 2022-09-01 DIAGNOSIS — K602 Anal fissure, unspecified: Secondary | ICD-10-CM

## 2022-09-01 MED ORDER — AMBULATORY NON FORMULARY MEDICATION: 3 refills | Status: DC

## 2022-09-01 NOTE — Progress Notes (Signed)
HPI : Ann Frederick is a very pleasant 51 year old female with a history of anxiety and depression who is referred to [place] by Mackie Pai PA-C for further evaluation and management of symptomatic hemorrhoids.  The patient states that she has had intermittent symptoms related to hemorrhoids for a long time, but she experienced abrupt onset perianal pain about 4 weeks ago and was diagnosed with a thrombosed external hemorrhoid.  Excision was offered, but declined by the patient.  She has experienced improvement in her symptoms, but still reports perianal pain and discomfort as well as some intermittent bleeding.  In addition to the pain, she also reports pain with the passage of stool.  The pain is not severe, but symptoms is very bothersome.  She also has perianal itching and burning.  She sees blood occasionally.  She does not think she has had symptoms of prolapsing internal hemorrhoids, but does appreciate a "fullness" in the anal area.  She reports fairly regular bowel habits, usually once a day.  Her stool consistency varies and she does admit to hard stools on occasion with straining.  Sometimes she will take magnesium as needed for constipation.  Diarrhea is not typically a problem for her.  She denies spending excessive time on the toilet with bowel movements (typically less than 10 minutes)  She had a colonoscopy in 2016 by Dr. Ardis Hughs which was normal.  Recommended to repeat in 10 years   Past Medical History:  Diagnosis Date   Acne    Anemia    history of   Anxiety    Arthritis    Depression    GERD (gastroesophageal reflux disease)    worse while pregnant   Headache    Migraines   Heart murmur    ? heart murmur in past   History of blood transfusion 2013   Hypertension    Metabolic syndrome      Past Surgical History:  Procedure Laterality Date   ANTERIOR FUSION CERVICAL SPINE  2018   CESAREAN SECTION     x 2   CESAREAN SECTION  09/29/2012   Procedure: CESAREAN SECTION;   Surgeon: Luz Lex, MD;  Location: [facility];  Service: Obstetrics;  Laterality: N/A;  Repeat edc 10/12/12/REQUEST;Chassity,Dee,Colleen   COLONOSCOPY     ESOPHAGOGASTRODUODENOSCOPY  normal    7/12   JOINT REPLACEMENT Bilateral    LAPAROSCOPIC VAGINAL HYSTERECTOMY WITH SALPINGECTOMY Bilateral 02/23/2016   Procedure: LAPAROSCOPIC ASSISTED VAGINAL HYSTERECTOMY WITH bilateral SALPINGECTOMY, right oophorectomy. laporotic repair of incidental cystotomy.;  Surgeon: Louretta Shorten, MD;  Location: [facility];  Service: Gynecology;  Laterality: Bilateral;   MOUTH SURGERY     Family History  Problem Relation Age of Onset   Depression Mother    Hypertension Mother    Diabetes Mother    Obesity Mother    Thyroid disease Mother    Cancer Father        ? lung CA   Kidney disease Father    Hydrocephalus Sister    Colon cancer Neg Hx    Esophageal cancer Neg Hx    Liver cancer Neg Hx    Pancreatic cancer Neg Hx    Rectal cancer Neg Hx    Stomach cancer Neg Hx    Social History   Tobacco Use   Smoking status: Never   Smokeless tobacco: Never  Vaping Use   Vaping Use: Never used  Substance Use Topics   Alcohol use: Yes    Comment: 1 glass of  wine per week prior to preg   Drug use: No   Current Outpatient Medications  Medication Sig Dispense Refill   ALPRAZolam (XANAX) 0.5 MG tablet alprazolam 0.5 mg tablet  TAKE 1 TABLET BY MOUTH THREE TIMES A DAY AS NEEDED FOR ANXIETY     aspirin 81 MG tablet Take 81 mg by mouth every other day.      buPROPion (WELLBUTRIN XL) 300 MG 24 hr tablet Take 300 mg by mouth every morning.     busPIRone (BUSPAR) 15 MG tablet TAKE 1 TABLET BY MOUTH 2 TIMES DAILY. 180 tablet 2   chlorthalidone (HYGROTON) 25 MG tablet TAKE 1 TABLET BY MOUTH EVERY DAY 90 tablet 1   ezetimibe (ZETIA) 10 MG tablet TAKE 1 TABLET BY MOUTH EVERY DAY 90 tablet 3   Insulin Pen Needle (B-D ULTRAFINE III SHORT PEN) 31G X 8 MM MISC USE AS DIRECTED TO INJECT SAXENDA 100 each 3   metFORMIN  (GLUCOPHAGE) 500 MG tablet TAKE 1 TABLET BY MOUTH 2 TIMES DAILY WITH A MEAL. 180 tablet 0   SAXENDA 18 MG/3ML SOPN INJECT 3 MG INTO THE SKIN DAILY. 3 mL 2   Turmeric 500 MG CAPS Take 1 capsule by mouth 2 (two) times daily.     VITAMIN D PO Take 1,000 Units by mouth 2 (two) times daily.     zolpidem (AMBIEN) 5 MG tablet Take 5 mg by mouth at bedtime as needed for sleep.     Diclofenac Sodium (VOLTAREN EX)  (Patient not taking: Reported on 09/01/2022)     fluticasone (FLONASE) 50 MCG/ACT nasal spray SPRAY 2 SPRAYS INTO EACH NOSTRIL EVERY DAY 16 mL 1   meclizine (ANTIVERT) 25 MG tablet Take 1 tablet (25 mg total) by mouth 3 (three) times daily as needed for dizziness. 30 tablet 0   methocarbamol (ROBAXIN) 500 MG tablet Take 1 tablet (500 mg total) by mouth every 8 (eight) hours as needed for muscle spasms. (Patient not taking: Reported on 09/01/2022) 30 tablet 0   omeprazole (PRILOSEC) 20 MG capsule TAKE 1 CAPSULE BY MOUTH EVERY DAY 90 capsule 1   ondansetron (ZOFRAN) 4 MG tablet Take 1 tablet (4 mg total) by mouth every 8 (eight) hours as needed for nausea or vomiting. 20 tablet 0   potassium chloride SA (KLOR-CON M) 20 MEQ tablet Take 2 tablets (40 mEq total) by mouth daily for 4 days. 8 tablet 0   No current facility-administered medications for this visit.   Allergies  Allergen Reactions   Azithromycin Nausea And Vomiting    diarrhea     Review of Systems: All systems reviewed and negative except where noted in HPI.    No results found.  Physical Exam: BP 110/70 (BP Location: Left Arm, Patient Position: Sitting, Cuff Size: Normal)   Pulse 86   Ht '5\' 2"'$  (1.575 m)   Wt 167 lb 6.4 oz (75.9 kg)   LMP 02/11/2016 (Exact Date)   SpO2 97%   BMI 30.62 kg/m  Constitutional: Pleasant,well-developed, African-American female in no acute distress. HEENT: Normocephalic and atraumatic. Conjunctivae are normal. No scleral icterus. Neck supple.  Cardiovascular: Normal rate, regular rhythm.   Pulmonary/chest: Effort normal and breath sounds normal. No wheezing, rales or rhonchi. Abdominal: Soft, nondistended, nontender. Bowel sounds active throughout. There are no masses palpable. No hepatomegaly. Extremities: no edema Rectal :  Posterior midline fissure present, External hemorrhoid present along the right posterior aspect, soft.  Digital rectal exam with normal sphincter tone, soft brown stool in rectal  vault, no masses.  Anoscopy exam revealed mildly prominent left lateral hemorrhoid, otherwise normal.  RN Thomes Dinning present for rectal exam Neurological: Alert and oriented to person place and time. Skin: Skin is warm and dry. No rashes noted. Psychiatric: Normal mood and affect. Behavior is normal.  CBC    Component Value Date/Time   WBC 11.5 (H) 02/20/2022 0030   RBC 3.28 (L) 02/20/2022 0030   HGB 10.2 (L) 02/20/2022 0030   HCT 30.5 (L) 02/20/2022 0030   PLT 450 (H) 02/20/2022 0030   MCV 93.0 02/20/2022 0030   MCH 31.1 02/20/2022 0030   MCHC 33.4 02/20/2022 0030   RDW 12.9 02/20/2022 0030   LYMPHSABS 3.0 02/20/2022 0030   MONOABS 0.7 02/20/2022 0030   EOSABS 0.3 02/20/2022 0030   BASOSABS 0.1 02/20/2022 0030    CMP     Component Value Date/Time   NA 136 02/20/2022 0030   K 2.9 (L) 02/20/2022 0030   CL 97 (L) 02/20/2022 0030   CO2 32 02/20/2022 0030   GLUCOSE 124 (H) 02/20/2022 0030   BUN 12 02/20/2022 0030   CREATININE 0.89 02/20/2022 0030   CREATININE 0.90 10/27/2020 1019   CALCIUM 8.8 (L) 02/20/2022 0030   PROT 6.7 02/20/2022 0030   ALBUMIN 3.1 (L) 02/20/2022 0030   AST 21 02/20/2022 0030   ALT 18 02/20/2022 0030   ALKPHOS 51 02/20/2022 0030   BILITOT 0.5 02/20/2022 0030   GFRNONAA >60 02/20/2022 0030   GFRNONAA 89 10/13/2020 1124   GFRAA 103 10/13/2020 1124     ASSESSMENT AND PLAN: 51 year old female with recent thrombosed external hemorrhoid, currently is soft and nontender with clinical improvement, also found to have small posterior  midline anal fissure, which is likely contributing to some of her symptoms of perianal discomfort and itching as well as bright red blood per rectum.  She did not have significant internal hemorrhoids on anoscopy exam, so I do not think that hemorrhoid banding would be very useful for her. We discussed the anatomy, pathophysiology and management of hemorrhoids and anal fissures.  Specifically, we discussed the importance of optimizing bowel habits and stool consistency, limiting hard stools and straining and excessive irritation to the area. I recommended she start taking Metamucil on a daily basis.  We will start intra-anal nitroglycerin to help perianal discomfort secondary to her fissure.  She can continue taking topical creams for her external hemorrhoid until her symptoms are resolved. Follow-up in 8 weeks if her symptoms are not improved to reassess fissure.   Anal fissure -Daily Metamucil indefinitely -Intra-anal nitroglycerin 0.125% twice daily for 6 to 8 weeks - Do not recommend internal hemorrhoid banding at this time  External hemorrhoid -Daily Metamucil - Continue as needed Preparation H   Colon cancer screening - Patient will be due 2026  Jesus Poplin E. Candis Schatz, MD [facility]   CC:  Mackie Pai, [place]

## 2022-09-01 NOTE — Patient Instructions (Signed)
Please use Metamucil daily.   We have sent topical nitroglycerin to your pharmacy to use twice daily for 6-8 weeks.    Continue using Prep H.   Follow up in 8 weeks as needed.

## 2022-09-10 ENCOUNTER — Ambulatory Visit: Payer: BC Managed Care – PPO | Admitting: Nurse Practitioner

## 2022-09-15 ENCOUNTER — Ambulatory Visit: Payer: BC Managed Care – PPO | Admitting: Family Medicine

## 2022-09-16 ENCOUNTER — Ambulatory Visit: Payer: BC Managed Care – PPO | Admitting: Family Medicine

## 2022-09-17 ENCOUNTER — Telehealth: Payer: Self-pay

## 2022-09-17 NOTE — Telephone Encounter (Signed)
PA initiated via Covermymeds; KEY: [id number]. Awaiting determination.

## 2022-09-17 NOTE — Telephone Encounter (Signed)
PA denied. Pt did not lose required 5% body weight.

## 2022-10-28 ENCOUNTER — Other Ambulatory Visit: Payer: Self-pay | Admitting: Medical

## 2022-10-29 ENCOUNTER — Telehealth: Payer: Self-pay | Admitting: Medical

## 2022-10-29 ENCOUNTER — Other Ambulatory Visit: Payer: Self-pay | Admitting: Medical

## 2022-10-29 DIAGNOSIS — R739 Hyperglycemia, unspecified: Secondary | ICD-10-CM

## 2022-10-29 DIAGNOSIS — Z0000 Encounter for general adult medical examination without abnormal findings: Secondary | ICD-10-CM

## 2022-10-29 NOTE — Telephone Encounter (Signed)
Patient requested annual physical to be scheduled via mychart and would like to have her blood work done the week before. Please advise when orders are placed so appt can be made.

## 2022-11-01 NOTE — Telephone Encounter (Signed)
Pt called and lvm to return call to schedule a lab appt

## 2022-11-05 ENCOUNTER — Encounter: Payer: Self-pay | Admitting: Medical

## 2022-11-05 ENCOUNTER — Other Ambulatory Visit (INDEPENDENT_AMBULATORY_CARE_PROVIDER_SITE_OTHER): Payer: BC Managed Care – PPO

## 2022-11-05 DIAGNOSIS — R739 Hyperglycemia, unspecified: Secondary | ICD-10-CM

## 2022-11-05 DIAGNOSIS — R634 Abnormal weight loss: Secondary | ICD-10-CM

## 2022-11-05 DIAGNOSIS — Z0000 Encounter for general adult medical examination without abnormal findings: Secondary | ICD-10-CM

## 2022-11-05 DIAGNOSIS — I10 Essential (primary) hypertension: Secondary | ICD-10-CM

## 2022-11-05 LAB — COMPREHENSIVE METABOLIC PANEL
ALT: 13 U/L (ref 0–35)
AST: 13 U/L (ref 0–37)
Albumin: 4.1 g/dL (ref 3.5–5.2)
Alkaline Phosphatase: 55 U/L (ref 39–117)
BUN: 15 mg/dL (ref 6–23)
CO2: 31 mEq/L (ref 19–32)
Calcium: 9.5 mg/dL (ref 8.4–10.5)
Chloride: 101 mEq/L (ref 96–112)
Creatinine, Ser: 0.97 mg/dL (ref 0.40–1.20)
GFR: 67.86 mL/min (ref >60.00)
Glucose, Bld: 89 mg/dL (ref 70–99)
Potassium: 3.7 mEq/L (ref 3.5–5.1)
Sodium: 138 mEq/L (ref 135–145)
Total Bilirubin: 0.6 mg/dL (ref 0.2–1.2)
Total Protein: 6.7 g/dL (ref 6.0–8.3)

## 2022-11-05 LAB — CBC WITH DIFFERENTIAL/PLATELET
Basophils Absolute: 0.1 10*3/uL (ref 0.0–0.1)
Basophils Relative: 1 % (ref 0.0–3.0)
Eosinophils Absolute: 0.4 10*3/uL (ref 0.0–0.7)
Eosinophils Relative: 4.1 % (ref 0.0–5.0)
HCT: 38.3 % (ref 36.0–46.0)
Hemoglobin: 12.8 g/dL (ref 12.0–15.0)
Lymphocytes Relative: 32.3 % (ref 12.0–46.0)
Lymphs Abs: 3.1 10*3/uL (ref 0.7–4.0)
MCHC: 33.4 g/dL (ref 30.0–36.0)
MCV: 92.1 fl (ref 78.0–100.0)
Monocytes Absolute: 0.6 10*3/uL (ref 0.1–1.0)
Monocytes Relative: 6.3 % (ref 3.0–12.0)
Neutro Abs: 5.4 10*3/uL (ref 1.4–7.7)
Neutrophils Relative %: 56.3 % (ref 43.0–77.0)
Platelets: 502 10*3/uL — ABNORMAL HIGH (ref 150.0–400.0)
RBC: 4.16 Mil/uL (ref 3.87–5.11)
RDW: 12.9 % (ref 11.5–15.5)
WBC: 9.5 10*3/uL (ref 4.0–10.5)

## 2022-11-05 LAB — LDL CHOLESTEROL, DIRECT: Direct LDL: 130 mg/dL

## 2022-11-05 LAB — LIPID PANEL
Cholesterol: 221 mg/dL — ABNORMAL HIGH (ref 0–200)
HDL: 48.1 mg/dL (ref >39.00)
NonHDL: 173.19
Total CHOL/HDL Ratio: 5
Triglycerides: 210 mg/dL — ABNORMAL HIGH (ref 0.0–149.0)
VLDL: 42 mg/dL — ABNORMAL HIGH (ref 0.0–40.0)

## 2022-11-05 LAB — HEMOGLOBIN A1C: Hgb A1c MFr Bld: 5.4 % (ref 4.6–6.5)

## 2022-11-05 LAB — TSH: TSH: 1.33 u[IU]/mL (ref 0.35–5.50)

## 2022-11-05 NOTE — Addendum Note (Signed)
Addended by: Jeronimo Greaves on: 11/05/2022 08:40 AM   Modules accepted: Orders

## 2022-11-09 ENCOUNTER — Encounter: Payer: Self-pay | Admitting: Medical

## 2022-11-09 ENCOUNTER — Ambulatory Visit (INDEPENDENT_AMBULATORY_CARE_PROVIDER_SITE_OTHER): Payer: BC Managed Care – PPO | Admitting: Medical

## 2022-11-09 VITALS — BP 120/75 | HR 100 | Resp 18 | Ht 62.0 in | Wt 169.0 lb

## 2022-11-09 DIAGNOSIS — R944 Abnormal results of kidney function studies: Secondary | ICD-10-CM

## 2022-11-09 DIAGNOSIS — R739 Hyperglycemia, unspecified: Secondary | ICD-10-CM

## 2022-11-09 DIAGNOSIS — Z0000 Encounter for general adult medical examination without abnormal findings: Secondary | ICD-10-CM | POA: Diagnosis not present

## 2022-11-09 DIAGNOSIS — R350 Frequency of micturition: Secondary | ICD-10-CM | POA: Diagnosis not present

## 2022-11-09 DIAGNOSIS — Z0184 Encounter for antibody response examination: Secondary | ICD-10-CM

## 2022-11-09 DIAGNOSIS — Z23 Encounter for immunization: Secondary | ICD-10-CM | POA: Diagnosis not present

## 2022-11-09 DIAGNOSIS — D75839 Thrombocytosis, unspecified: Secondary | ICD-10-CM

## 2022-11-09 DIAGNOSIS — Z0001 Encounter for general adult medical examination with abnormal findings: Secondary | ICD-10-CM | POA: Diagnosis not present

## 2022-11-09 DIAGNOSIS — Z111 Encounter for screening for respiratory tuberculosis: Secondary | ICD-10-CM

## 2022-11-09 LAB — POC URINALSYSI DIPSTICK (AUTOMATED)
Bilirubin, UA: NEGATIVE
Blood, UA: NEGATIVE
Glucose, UA: NEGATIVE
Leukocytes, UA: NEGATIVE
Nitrite, UA: POSITIVE
Protein, UA: NEGATIVE
Spec Grav, UA: 1.025 (ref 1.010–1.025)
Urobilinogen, UA: 0.2 E.U./dL
pH, UA: 6 (ref 5.0–8.0)

## 2022-11-09 NOTE — Progress Notes (Signed)
Subjective:    Patient ID: Ann Frederick, female    DOB: 1971-11-07, 51 y.o.   MRN: [id number]  HPI  Pt in for wellness exam.  Pt already had labs. She admits not exercising much. She states she snacks a lot at night and not sleeping much. About 4-6 hours at night. She states always goes to sleep very late.    Discuss labs today.    Pt states she stopped chlorthalidone on Saturday. She states bp 120/80 range when she checks.   Pt platelets mild elevated.    Pt is on zetia. Family hx of CAD.  Pt mammogram was normal.  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    Review of Systems  Constitutional:  Negative for chills and fatigue.  Respiratory:  Negative for cough, chest tightness, wheezing and stridor.   Cardiovascular:  Negative for chest pain and palpitations.  Gastrointestinal:  Negative for abdominal pain, diarrhea and vomiting.  Genitourinary:  Negative for dyspareunia and dysuria.  Musculoskeletal:  Negative for back pain, myalgias and neck stiffness.  Skin:  Negative for pallor and wound.  Neurological:  Negative for seizures, facial asymmetry and light-headedness.  Hematological:  Negative for adenopathy. Does not bruise/bleed easily.  Psychiatric/Behavioral:  Negative for behavioral problems and confusion.     Past Medical History:  Diagnosis Date   Acne    Anemia    history of   Anxiety    Arthritis    Depression    GERD (gastroesophageal reflux disease)    worse while pregnant   Headache    Migraines   Heart murmur    ? heart murmur in past   History of blood transfusion 2013   Hypertension    Metabolic syndrome      Social History   Socioeconomic History   Marital status: Married    Spouse name: Not on file   Number of children: 2   Years of education: Not on file   Highest education level: Not on file  Occupational History   Not on file  Tobacco Use   Smoking status: Never   Smokeless tobacco: Never  Vaping Use   Vaping  Use: Never used  Substance and Sexual Activity   Alcohol use: Yes    Comment: 1 glass of wine per week prior to preg   Drug use: No   Sexual activity: Yes  Other Topics Concern   Not on file  Social History Narrative   Not on file   Social Determinants of Health   Financial Resource Strain: Not on file  Food Insecurity: Not on file  Transportation Needs: Not on file  Physical Activity: Not on file  Stress: Not on file  Social Connections: Not on file  Intimate Partner Violence: Not on file    Past Surgical History:  Procedure Laterality Date   [place]  2018   CESAREAN SECTION     x 2   CESAREAN SECTION  09/29/2012   Procedure: CESAREAN SECTION;  Surgeon: Luz Lex, MD;  Location: [facility];  Service: Obstetrics;  Laterality: N/A;  Repeat edc 10/12/12/REQUEST;Chassity,Dee,Colleen   COLONOSCOPY     ESOPHAGOGASTRODUODENOSCOPY  normal    7/12   JOINT REPLACEMENT Bilateral    LAPAROSCOPIC VAGINAL HYSTERECTOMY WITH SALPINGECTOMY Bilateral 02/23/2016   Procedure: LAPAROSCOPIC ASSISTED VAGINAL HYSTERECTOMY WITH bilateral SALPINGECTOMY, right oophorectomy. laporotic repair of incidental cystotomy.;  Surgeon: Louretta Shorten, MD;  Location: [facility];  Service: Gynecology;  Laterality: Bilateral;  MOUTH SURGERY      Family History  Problem Relation Age of Onset   Depression Mother    Hypertension Mother    Diabetes Mother    Obesity Mother    Thyroid disease Mother    Cancer Father        ? lung CA   Kidney disease Father    Hydrocephalus Sister    Colon cancer Neg Hx    Esophageal cancer Neg Hx    Liver cancer Neg Hx    Pancreatic cancer Neg Hx    Rectal cancer Neg Hx    Stomach cancer Neg Hx     Allergies  Allergen Reactions   Azithromycin Nausea And Vomiting    diarrhea    Current Outpatient Medications on File Prior to Visit  Medication Sig Dispense Refill   ALPRAZolam (XANAX) 0.5 MG tablet alprazolam 0.5 mg tablet  TAKE 1 TABLET BY MOUTH  THREE TIMES A DAY AS NEEDED FOR ANXIETY     AMBULATORY NON FORMULARY MEDICATION Medication Name: nitroglycerin 0.125 % twice daily for 6-8 weeks 1 Tube 3   aspirin 81 MG tablet Take 81 mg by mouth every other day.      buPROPion (WELLBUTRIN XL) 300 MG 24 hr tablet Take 300 mg by mouth every morning.     busPIRone (BUSPAR) 15 MG tablet TAKE 1 TABLET BY MOUTH TWICE A DAY 180 tablet 2   chlorthalidone (HYGROTON) 25 MG tablet TAKE 1 TABLET BY MOUTH EVERY DAY 90 tablet 1   ezetimibe (ZETIA) 10 MG tablet TAKE 1 TABLET BY MOUTH EVERY DAY 90 tablet 3   Insulin Pen Needle (B-D ULTRAFINE III SHORT PEN) 31G X 8 MM MISC USE AS DIRECTED TO INJECT SAXENDA 100 each 3   metFORMIN (GLUCOPHAGE) 500 MG tablet TAKE 1 TABLET BY MOUTH 2 TIMES DAILY WITH A MEAL. 180 tablet 0   SAXENDA 18 MG/3ML SOPN INJECT 3 MG INTO THE SKIN DAILY. 3 mL 2   Turmeric 500 MG CAPS Take 1 capsule by mouth 2 (two) times daily.     VITAMIN D PO Take 1,000 Units by mouth 2 (two) times daily.     zolpidem (AMBIEN) 5 MG tablet Take 5 mg by mouth at bedtime as needed for sleep.     No current facility-administered medications on file prior to visit.    BP 120/75   Pulse 100   Resp 18   Ht '5\' 2"'$  (1.575 m)   Wt 169 lb (76.7 kg)   LMP 02/11/2016 (Exact Date)   SpO2 100%   BMI 30.91 kg/m        Objective:   Physical Exam  General Mental Status- Alert. General Appearance- Not in acute distress.   Skin General: Color- Normal Color. Moisture- Normal Moisture.  Neck Carotid Arteries- Normal color. Moisture- Normal Moisture. No carotid bruits. No JVD.  Chest and Lung Exam Auscultation: Breath Sounds:-Normal.  Cardiovascular Auscultation:Rythm- Regular. Murmurs & Other Heart Sounds:Auscultation of the heart reveals- No Murmurs.  Abdomen Inspection:-Inspeection Normal. Palpation/Percussion:Note:No mass. Palpation and Percussion of the abdomen reveal- Non Tender, Non Distended + BS, no rebound or  guarding.   Neurologic Cranial Nerve exam:- CN III-XII intact(No nystagmus), symmetric smile. Strength:- 5/5 equal and symmetric strength both upper and lower extremities.       Assessment & Plan:   Patient Instructions  For you wellness exam reviewed labs today  Vaccine given today. Tdap.  Recommend exercise and healthy diet.  We will let you know lab results  as they come in.  Follow up date appointment will be determined after lab review.    Placed future cbc and cmp well hydrated.(High platlets and decreased gfr) also for high platlets went ahead and referred you to hematologist.  Continue zetia and improve diet.   Htn- keep checking your blood pressure daily. Bp controlled despite stopping the diuretic. If bp trends upward recommend low dose losartan 25 mg  BMI- 30. Can restart saxenda.   Follow up date to be determined after my chart bp update.              Mackie Pai, [place]   [id number] charge in addition to wellness exam. Addressed htn, decreaed gfr, high plateltets, obesity and referred to Dr. Venetia Constable total time with pt 40 minutes)

## 2022-11-09 NOTE — Addendum Note (Signed)
Addended by: Anabel Halon on: 11/09/2022 09:44 AM   Modules accepted: Orders

## 2022-11-09 NOTE — Addendum Note (Signed)
Addended by: Jeronimo Greaves on: 11/09/2022 10:13 AM   Modules accepted: Orders

## 2022-11-09 NOTE — Patient Instructions (Addendum)
For you wellness exam reviewed labs today  Vaccine given today. Tdap.  Recommend exercise and healthy diet.  We will let you know future lab results as they come in.  Follow up date appointment will be determined after lab review.    Placed future cbc and cmp well hydrated.(High platlets and decreased gfr) also for high platlets went ahead and referred you to hematologist.  Continue zetia and improve diet.   Htn- keep checking your blood pressure daily. Bp controlled despite stopping the diuretic. If bp trends upward recommend low dose losartan 25 mg  BMI- 30. Can restart saxenda.   Follow up date to be determined after my chart bp update.  Preventive Care 7-52 Years Old, Female Preventive care refers to lifestyle choices and visits with your health care provider that can promote health and wellness. Preventive care visits are also called wellness exams. What can I expect for my preventive care visit? Counseling Your health care provider may ask you questions about your: Medical history, including: Past medical problems. Family medical history. Pregnancy history. Current health, including: Menstrual cycle. Method of birth control. Emotional well-being. Home life and relationship well-being. Sexual activity and sexual health. Lifestyle, including: Alcohol, nicotine or tobacco, and drug use. Access to firearms. Diet, exercise, and sleep habits. Work and work Statistician. Sunscreen use. Safety issues such as seatbelt and bike helmet use. Physical exam Your health care provider will check your: Height and weight. These may be used to calculate your BMI (body mass index). BMI is a measurement that tells if you are at a healthy weight. Waist circumference. This measures the distance around your waistline. This measurement also tells if you are at a healthy weight and may help predict your risk of certain diseases, such as type 2 diabetes and high blood pressure. Heart rate  and blood pressure. Body temperature. Skin for abnormal spots. What immunizations do I need?  Vaccines are usually given at various ages, according to a schedule. Your health care provider will recommend vaccines for you based on your age, medical history, and lifestyle or other factors, such as travel or where you work. What tests do I need? Screening Your health care provider may recommend screening tests for certain conditions. This may include: Lipid and cholesterol levels. Diabetes screening. This is done by checking your blood sugar (glucose) after you have not eaten for a while (fasting). Pelvic exam and Pap test. Hepatitis B test. Hepatitis C test. HIV (human immunodeficiency virus) test. STI (sexually transmitted infection) testing, if you are at risk. Lung cancer screening. Colorectal cancer screening. Mammogram. Talk with your health care provider about when you should start having regular mammograms. This may depend on whether you have a family history of breast cancer. BRCA-related cancer screening. This may be done if you have a family history of breast, ovarian, tubal, or peritoneal cancers. Bone density scan. This is done to screen for osteoporosis. Talk with your health care provider about your test results, treatment options, and if necessary, the need for more tests. Follow these instructions at home: Eating and drinking  Eat a diet that includes fresh fruits and vegetables, whole grains, lean protein, and low-fat dairy products. Take vitamin and mineral supplements as recommended by your health care provider. Do not drink alcohol if: Your health care provider tells you not to drink. You are pregnant, may be pregnant, or are planning to become pregnant. If you drink alcohol: Limit how much you have to 0-1 drink a day. Know how much  alcohol is in your drink. In the [place] one drink equals one 12 oz bottle of beer (355 mL), one 5 oz glass of wine (148 mL), or one 1 oz  glass of hard liquor (44 mL). Lifestyle Brush your teeth every morning and night with fluoride toothpaste. Floss one time each day. Exercise for at least 30 minutes 5 or more days each week. Do not use any products that contain nicotine or tobacco. These products include cigarettes, chewing tobacco, and vaping devices, such as e-cigarettes. If you need help quitting, ask your health care provider. Do not use drugs. If you are sexually active, practice safe sex. Use a condom or other form of protection to prevent STIs. If you do not wish to become pregnant, use a form of birth control. If you plan to become pregnant, see your health care provider for a prepregnancy visit. Take aspirin only as told by your health care provider. Make sure that you understand how much to take and what form to take. Work with your health care provider to find out whether it is safe and beneficial for you to take aspirin daily. Find healthy ways to manage stress, such as: Meditation, yoga, or listening to music. Journaling. Talking to a trusted person. Spending time with friends and family. Minimize exposure to UV radiation to reduce your risk of skin cancer. Safety Always wear your seat belt while driving or riding in a vehicle. Do not drive: If you have been drinking alcohol. Do not ride with someone who has been drinking. When you are tired or distracted. While texting. If you have been using any mind-altering substances or drugs. Wear a helmet and other protective equipment during sports activities. If you have firearms in your house, make sure you follow all gun safety procedures. Seek help if you have been physically or sexually abused. What's next? Visit your health care provider once a year for an annual wellness visit. Ask your health care provider how often you should have your eyes and teeth checked. Stay up to date on all vaccines. This information is not intended to replace advice given to you by  your health care provider. Make sure you discuss any questions you have with your health care provider. Document Revised: 06/10/2021 Document Reviewed: 06/10/2021 Elsevier Patient Education  2023 Elsevier Inc.               

## 2022-11-11 ENCOUNTER — Encounter: Payer: Self-pay | Admitting: Medical

## 2022-11-11 NOTE — Telephone Encounter (Signed)
Ok to add TB ?

## 2022-11-11 NOTE — Addendum Note (Signed)
Addended by: Anabel Halon on: 11/11/2022 04:39 PM   Modules accepted: Orders

## 2022-11-11 NOTE — Telephone Encounter (Signed)
Pt would like a tb blood test added to her panel as well. Please advise pt on mychart once this has been added.

## 2022-11-16 ENCOUNTER — Other Ambulatory Visit (INDEPENDENT_AMBULATORY_CARE_PROVIDER_SITE_OTHER): Payer: BC Managed Care – PPO

## 2022-11-16 ENCOUNTER — Encounter: Payer: Self-pay | Admitting: Medical

## 2022-11-16 DIAGNOSIS — D75839 Thrombocytosis, unspecified: Secondary | ICD-10-CM | POA: Diagnosis not present

## 2022-11-16 DIAGNOSIS — Z111 Encounter for screening for respiratory tuberculosis: Secondary | ICD-10-CM | POA: Diagnosis not present

## 2022-11-16 DIAGNOSIS — R944 Abnormal results of kidney function studies: Secondary | ICD-10-CM | POA: Diagnosis not present

## 2022-11-16 DIAGNOSIS — Z0184 Encounter for antibody response examination: Secondary | ICD-10-CM | POA: Diagnosis not present

## 2022-11-16 LAB — CBC WITH DIFFERENTIAL/PLATELET
Basophils Absolute: 0.1 10*3/uL (ref 0.0–0.1)
Basophils Relative: 0.8 % (ref 0.0–3.0)
Eosinophils Absolute: 0.4 10*3/uL (ref 0.0–0.7)
Eosinophils Relative: 4.3 % (ref 0.0–5.0)
HCT: 34.3 % — ABNORMAL LOW (ref 36.0–46.0)
Hemoglobin: 11.7 g/dL — ABNORMAL LOW (ref 12.0–15.0)
Lymphocytes Relative: 32.1 % (ref 12.0–46.0)
Lymphs Abs: 3 10*3/uL (ref 0.7–4.0)
MCHC: 34.1 g/dL (ref 30.0–36.0)
MCV: 92.2 fl (ref 78.0–100.0)
Monocytes Absolute: 0.5 10*3/uL (ref 0.1–1.0)
Monocytes Relative: 5.2 % (ref 3.0–12.0)
Neutro Abs: 5.4 10*3/uL (ref 1.4–7.7)
Neutrophils Relative %: 57.6 % (ref 43.0–77.0)
Platelets: 455 10*3/uL — ABNORMAL HIGH (ref 150.0–400.0)
RBC: 3.72 Mil/uL — ABNORMAL LOW (ref 3.87–5.11)
RDW: 12.9 % (ref 11.5–15.5)
WBC: 9.4 10*3/uL (ref 4.0–10.5)

## 2022-11-16 LAB — COMPREHENSIVE METABOLIC PANEL
ALT: 19 U/L (ref 0–35)
AST: 16 U/L (ref 0–37)
Albumin: 3.7 g/dL (ref 3.5–5.2)
Alkaline Phosphatase: 43 U/L (ref 39–117)
BUN: 9 mg/dL (ref 6–23)
CO2: 28 mEq/L (ref 19–32)
Calcium: 8.8 mg/dL (ref 8.4–10.5)
Chloride: 106 mEq/L (ref 96–112)
Creatinine, Ser: 0.96 mg/dL (ref 0.40–1.20)
GFR: 68.69 mL/min (ref >60.00)
Glucose, Bld: 89 mg/dL (ref 70–99)
Potassium: 3.9 mEq/L (ref 3.5–5.1)
Sodium: 141 mEq/L (ref 135–145)
Total Bilirubin: 0.5 mg/dL (ref 0.2–1.2)
Total Protein: 5.9 g/dL — ABNORMAL LOW (ref 6.0–8.3)

## 2022-11-16 NOTE — Telephone Encounter (Signed)
Edward,  I did draw an extra SST tube for the MMR titer if you are ok to place the future lab order.

## 2022-11-16 NOTE — Addendum Note (Signed)
Addended by: Kelle Darting A on: 11/16/2022 02:10 PM   Modules accepted: Orders

## 2022-11-16 NOTE — Addendum Note (Signed)
Addended by: Anabel Halon on: 11/16/2022 10:18 AM   Modules accepted: Orders

## 2022-11-17 LAB — MEASLES/MUMPS/RUBELLA IMMUNITY
Mumps IgG: 31.4 AU/mL
Rubella: 8.21 Index
Rubeola IgG: >300 AU/mL

## 2022-11-23 LAB — QUANTIFERON-TB GOLD PLUS
Mitogen-NIL: >10 IU/mL
NIL: 0.03 IU/mL
QuantiFERON-TB Gold Plus: NEGATIVE
TB1-NIL: 0.01 IU/mL
TB2-NIL: 0.01 IU/mL

## 2022-11-26 ENCOUNTER — Encounter: Payer: Self-pay | Admitting: Family

## 2022-11-26 ENCOUNTER — Inpatient Hospital Stay (HOSPITAL_BASED_OUTPATIENT_CLINIC_OR_DEPARTMENT_OTHER): Payer: BC Managed Care – PPO | Admitting: Family

## 2022-11-26 ENCOUNTER — Inpatient Hospital Stay: Payer: BC Managed Care – PPO | Attending: Hematology & Oncology

## 2022-11-26 ENCOUNTER — Other Ambulatory Visit: Payer: Self-pay | Admitting: Family

## 2022-11-26 VITALS — BP 110/79 | HR 82 | Temp 98.2°F | Resp 17 | Ht 62.0 in | Wt 167.8 lb

## 2022-11-26 DIAGNOSIS — E8881 Metabolic syndrome: Secondary | ICD-10-CM | POA: Insufficient documentation

## 2022-11-26 DIAGNOSIS — D75839 Thrombocytosis, unspecified: Secondary | ICD-10-CM

## 2022-11-26 DIAGNOSIS — Z7984 Long term (current) use of oral hypoglycemic drugs: Secondary | ICD-10-CM | POA: Insufficient documentation

## 2022-11-26 DIAGNOSIS — D509 Iron deficiency anemia, unspecified: Secondary | ICD-10-CM

## 2022-11-26 LAB — CMP (CANCER CENTER ONLY)
ALT: 11 U/L (ref 0–44)
AST: 12 U/L — ABNORMAL LOW (ref 15–41)
Albumin: 4.8 g/dL (ref 3.5–5.0)
Alkaline Phosphatase: 54 U/L (ref 38–126)
Anion gap: 9 (ref 5–15)
BUN: 13 mg/dL (ref 6–20)
CO2: 29 mmol/L (ref 22–32)
Calcium: 10.5 mg/dL — ABNORMAL HIGH (ref 8.9–10.3)
Chloride: 100 mmol/L (ref 98–111)
Creatinine: 1.07 mg/dL — ABNORMAL HIGH (ref 0.44–1.00)
GFR, Estimated: >60 mL/min (ref >60)
Glucose, Bld: 99 mg/dL (ref 70–99)
Potassium: 3.9 mmol/L (ref 3.5–5.1)
Sodium: 138 mmol/L (ref 135–145)
Total Bilirubin: 0.6 mg/dL (ref 0.3–1.2)
Total Protein: 8.1 g/dL (ref 6.5–8.1)

## 2022-11-26 LAB — CBC WITH DIFFERENTIAL (CANCER CENTER ONLY)
Abs Immature Granulocytes: 0.08 10*3/uL — ABNORMAL HIGH (ref 0.00–0.07)
Basophils Absolute: 0.1 10*3/uL (ref 0.0–0.1)
Basophils Relative: 1 %
Eosinophils Absolute: 0.2 10*3/uL (ref 0.0–0.5)
Eosinophils Relative: 2 %
HCT: 41.5 % (ref 36.0–46.0)
Hemoglobin: 13.5 g/dL (ref 12.0–15.0)
Immature Granulocytes: 1 %
Lymphocytes Relative: 28 %
Lymphs Abs: 2.5 10*3/uL (ref 0.7–4.0)
MCH: 30.3 pg (ref 26.0–34.0)
MCHC: 32.5 g/dL (ref 30.0–36.0)
MCV: 93 fL (ref 80.0–100.0)
Monocytes Absolute: 0.6 10*3/uL (ref 0.1–1.0)
Monocytes Relative: 7 %
Neutro Abs: 5.6 10*3/uL (ref 1.7–7.7)
Neutrophils Relative %: 61 %
Platelet Count: 476 10*3/uL — ABNORMAL HIGH (ref 150–400)
RBC: 4.46 MIL/uL (ref 3.87–5.11)
RDW: 12.5 % (ref 11.5–15.5)
WBC Count: 9 10*3/uL (ref 4.0–10.5)
nRBC: 0 % (ref 0.0–0.2)

## 2022-11-26 LAB — RETICULOCYTES
Immature Retic Fract: 5 % (ref 2.3–15.9)
RBC.: 4.48 MIL/uL (ref 3.87–5.11)
Retic Count, Absolute: 86.5 10*3/uL (ref 19.0–186.0)
Retic Ct Pct: 1.9 % (ref 0.4–3.1)

## 2022-11-26 LAB — LACTATE DEHYDROGENASE: LDH: 141 U/L (ref 98–192)

## 2022-11-26 LAB — IRON AND IRON BINDING CAPACITY (CC-WL,HP ONLY)
Iron: 76 ug/dL (ref 28–170)
Saturation Ratios: 18 % (ref 10.4–31.8)
TIBC: 427 ug/dL (ref 250–450)
UIBC: 351 ug/dL (ref 148–442)

## 2022-11-26 LAB — FERRITIN: Ferritin: 27 ng/mL (ref 11–307)

## 2022-11-26 LAB — SAVE SMEAR(SSMR), FOR PROVIDER SLIDE REVIEW

## 2022-11-26 NOTE — Progress Notes (Unsigned)
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  Platelets are 476, Hgb 13.5, MCV 93 and WBC count 9.0.   She has history of iron deficiency in the past. Some fatigue at times.  She states that she had her left hip replaced in 2020 and most recently had her right hip replaced earlier this year (2023).  She denies any issue with blood loss. No abnormal bruising, no petechiae.  Her colonoscopy in 2016 was negative.  No history of diabetes or thyroid disease.  She is on Metformin for metabolic syndrome.  No personal or known familial history of cancer.  No fever, chills, n/v, cough, rash, dizziness, SOB, chest pain, palpitations, abdominal pain or changes in bowel or bladder habits.  No swelling, tenderness, numbness or tingling in her extremities at this time.  No falls or syncope reported.  No smoking or recreational drug use. ETOH socially.  Appetite comes and goes. She states that eats one meal a day. She does eat meal in small amounts. She is doing her best to stay well hydrated. Her weight is stable at 167 lbs.  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  She stays busy with her family and enjoys walking for exercise.   ROS: All other 10 point review of systems is negative.   PAST MEDICAL HISTORY:   Past Medical History:  Diagnosis Date   Acne    Anemia    history of   Anxiety    Arthritis    Depression    GERD (gastroesophageal reflux disease)    worse while pregnant   Headache    Migraines   Heart murmur    ? heart murmur in past   History of blood transfusion 2013   Hypertension    Metabolic syndrome      ALLERGIES: Allergies  Allergen Reactions   Azithromycin Nausea And Vomiting    diarrhea      MEDICATIONS:  Current Outpatient Medications on File Prior to Visit  Medication Sig Dispense Refill   ALPRAZolam (XANAX) 0.5 MG tablet alprazolam 0.5 mg tablet  TAKE 1 TABLET BY MOUTH THREE TIMES A DAY AS NEEDED FOR ANXIETY     AMBULATORY NON FORMULARY MEDICATION Medication Name: nitroglycerin 0.125 % twice daily for 6-8 weeks 1 Tube 3   aspirin 81 MG tablet Take 81 mg by mouth every other day.      buPROPion (WELLBUTRIN XL) 300 MG 24 hr tablet Take 300 mg by mouth every morning.     busPIRone (BUSPAR) 15 MG tablet TAKE 1 TABLET BY MOUTH TWICE A DAY 180 tablet 2   chlorthalidone (HYGROTON) 25 MG tablet TAKE 1 TABLET BY MOUTH EVERY DAY 90 tablet 1   ezetimibe (ZETIA) 10 MG tablet TAKE 1 TABLET BY MOUTH EVERY DAY 90 tablet 3   Insulin Pen Needle (B-D ULTRAFINE III SHORT PEN) 31G X 8 MM MISC USE AS DIRECTED TO INJECT SAXENDA 100 each 3   metFORMIN (GLUCOPHAGE) 500 MG tablet TAKE 1 TABLET BY MOUTH 2 TIMES DAILY WITH A MEAL. 180 tablet 0   SAXENDA 18 MG/3ML SOPN INJECT 3 MG INTO THE SKIN DAILY.  3 mL 2   Turmeric 500 MG CAPS Take 1 capsule by mouth 2 (two) times daily.     VITAMIN D PO Take 1,000 Units by mouth 2 (two) times daily.     zolpidem (AMBIEN) 5 MG tablet Take 5 mg by mouth at bedtime as needed for sleep.     No current facility-administered medications on file prior to visit.     PAST SURGICAL HISTORY Past Surgical History:  Procedure Laterality Date   ANTERIOR FUSION CERVICAL SPINE  2018   CESAREAN SECTION     x 2   CESAREAN SECTION  09/29/2012   Procedure: CESAREAN SECTION;  Surgeon: Luz Lex, MD;  Location: [facility];  Service: Obstetrics;  Laterality: N/A;  Repeat edc 10/12/12/REQUEST;Chassity,Dee,Colleen   COLONOSCOPY     ESOPHAGOGASTRODUODENOSCOPY  normal    7/12   JOINT REPLACEMENT Bilateral    LAPAROSCOPIC VAGINAL HYSTERECTOMY WITH SALPINGECTOMY Bilateral  02/23/2016   Procedure: LAPAROSCOPIC ASSISTED VAGINAL HYSTERECTOMY WITH bilateral SALPINGECTOMY, right oophorectomy. laporotic repair of incidental cystotomy.;  Surgeon: Louretta Shorten, MD;  Location: [facility];  Service: Gynecology;  Laterality: Bilateral;   MOUTH SURGERY      FAMILY HISTORY: Family History  Problem Relation Age of Onset   Depression Mother    Hypertension Mother    Diabetes Mother    Obesity Mother    Thyroid disease Mother    Cancer Father        ? lung CA   Kidney disease Father    Hydrocephalus Sister    Colon cancer Neg Hx    Esophageal cancer Neg Hx    Liver cancer Neg Hx    Pancreatic cancer Neg Hx    Rectal cancer Neg Hx    Stomach cancer Neg Hx     SOCIAL HISTORY:  reports that she has never smoked. She has never used smokeless tobacco. She reports current alcohol use. She reports that she does not use drugs.  PERFORMANCE STATUS: The patient's performance status is 1 - Symptomatic but completely ambulatory  PHYSICAL EXAM: Most Recent Vital Signs: Last menstrual period 02/11/2016. BP 110/79 (BP Location: Left Arm, Patient Position: Sitting)   Pulse 82   Temp 98.2 F (36.8 C) (Oral)   Resp 17   Ht '5\' 2"'$  (1.575 m)   Wt 167 lb 12.8 oz (76.1 kg)   LMP 02/11/2016 (Exact Date)   SpO2 99%   BMI 30.69 kg/m   General Appearance:    Alert, cooperative, no distress, appears stated age  Head:    Normocephalic, without obvious abnormality, atraumatic  Eyes:    PERRL, conjunctiva/corneas clear, EOM's intact, fundi    benign, both eyes        Throat:   Lips, mucosa, and tongue normal; teeth and gums normal  Neck:   Supple, symmetrical, trachea midline, no adenopathy;    thyroid:  no enlargement/tenderness/nodules; no carotid   bruit or JVD  Back:     Symmetric, no curvature, ROM normal, no CVA tenderness  Lungs:     Clear to auscultation bilaterally, respirations unlabored  Chest Wall:    No tenderness or deformity   Heart:    Regular rate and rhythm, S1  and S2 normal, no murmur, rub   or gallop     Abdomen:     Soft, non-tender, bowel sounds active all four quadrants,    no masses, no organomegaly        Extremities:   Extremities normal, atraumatic, no cyanosis or edema  Pulses:   2+ and symmetric all extremities  Skin:   Skin color, texture, turgor normal, no rashes or lesions  Lymph nodes:   Cervical, supraclavicular, and axillary nodes normal  Neurologic:   CNII-XII intact, normal strength, sensation and reflexes    throughout    LABORATORY DATA:  Results for orders placed or performed in visit on 11/26/22 (from the past 48 hour(s))  CBC with Differential ([place] Only)     Status: Abnormal   Collection Time: 11/26/22  1:36 PM  Result Value Ref Range   WBC Count 9.0 4.0 - 10.5 K/uL   RBC 4.46 3.87 - 5.11 MIL/uL   Hemoglobin 13.5 12.0 - 15.0 g/dL   HCT 41.5 36.0 - 46.0 %   MCV 93.0 80.0 - 100.0 fL   MCH 30.3 26.0 - 34.0 pg   MCHC 32.5 30.0 - 36.0 g/dL   RDW 12.5 11.5 - 15.5 %   Platelet Count 476 (H) 150 - 400 K/uL   nRBC 0.0 0.0 - 0.2 %   Neutrophils Relative % 61 %   Neutro Abs 5.6 1.7 - 7.7 K/uL   Lymphocytes Relative 28 %   Lymphs Abs 2.5 0.7 - 4.0 K/uL   Monocytes Relative 7 %   Monocytes Absolute 0.6 0.1 - 1.0 K/uL   Eosinophils Relative 2 %   Eosinophils Absolute 0.2 0.0 - 0.5 K/uL   Basophils Relative 1 %   Basophils Absolute 0.1 0.0 - 0.1 K/uL   Immature Granulocytes 1 %   Abs Immature Granulocytes 0.08 (H) 0.00 - 0.07 K/uL    Comment: Performed at [facility] at [facility], [address] [place], [place] [address]  Reticulocytes     Status: None   Collection Time: 11/26/22  1:37 PM  Result Value Ref Range   Retic Ct Pct 1.9 0.4 - 3.1 %   RBC. 4.48 3.87 - 5.11 MIL/uL   Retic Count, Absolute 86.5 19.0 - 186.0 K/uL   Immature Retic Fract 5.0 2.3 - 15.9 %    Comment: Performed at [facility] at [facility], [address], [place], [place]  [address]      RADIOGRAPHY: No results found.     PATHOLOGY: None  ASSESSMENT/PLAN: Ms. 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  Iron studies are pending. She would like to move forward with IV iron if needed.  Blood smear reviewed with Dr. Marin Olp and she was noted to have large platelets and a couple hyper segmented polys. We will have her come back in next week for JAK 2 test.  Follow-up pending results.   All questions were answered. The patient knows to call the clinic with any problems, questions or concerns. We can certainly see the patient much sooner if necessary.  The patient was discussed with Dr. Marin Olp and he is in agreement with the aforementioned.   Lottie Dawson, NP

## 2022-11-29 ENCOUNTER — Telehealth: Payer: Self-pay | Admitting: Family

## 2022-11-29 NOTE — Telephone Encounter (Signed)
I was able to speak with Ann Frederick and go over recent iron studies a blood smear review. She will try taking Fusion plus daily. We will also have her come back in later this week for lab to check a JAK 2 on her. She is in agreement with the plan. No questions or concerns at this time. Patient appreciative of call. Scheduling message sent.

## 2022-12-01 ENCOUNTER — Inpatient Hospital Stay: Payer: BC Managed Care – PPO

## 2022-12-07 ENCOUNTER — Other Ambulatory Visit: Payer: Self-pay | Admitting: Medical

## 2022-12-14 ENCOUNTER — Telehealth: Payer: Self-pay | Admitting: *Deleted

## 2022-12-14 NOTE — Telephone Encounter (Signed)
Per 11/26/22 los - Follow-up pending results.

## 2022-12-30 NOTE — Progress Notes (Signed)
Cardiology Office Note:    Date:  01/02/2023   ID:  Ann Frederick, DOB [birth year], MRN [id number]  PCP:  Mackie Pai, PA-C  Cardiologist:  Buford Dresser, MD PhD  Referring MD: Mackie Pai, PA-C   CC: follow up  History of Present Illness:    Ann Frederick is a 52 y.o. female with a hx of hypertension, diabetes who is seen in follow up. She was first seen on 08/31/18 for tachycardia and palpitations  Cardiac history: first noted ~2018 that her heart rate would go to 150 bpm when exercising. Resting heart rate in the 80s usually, but was up to around 100 bpm resting early 2019. Had a cervical fusion summer 2019 and was less active around that time. Had prior history of chest pain in 2021, improved with toradol.  Today, she reports right sided, squeezing chest pain with the initial episode on Christmas Eve. She was in tears due to the severity. At the time she thought perhaps it was anxiety, so she took Xanax. Her pain recurred one week later and she took omeprazole, which she believes may have improved her symptoms. Most recently, she describes feeling a discomfort along her chest wall/lining that almost made her feel pruritic on the inside. This happened the other day, and she states it is currently an active symptom. Every now and then she experiences "flip-flop" palpitations.  About 1.5 month ago she began drinking hibiscus tea in an effort to not need to take her chlorthalidone. She was also taking Saxenda at the time, which had been stopped due to development of GI issues. Currently, she is taking chlorthalidone as needed and has been back on Saxenda for about 1 week. It is occasionally difficult to remain consistent with the daily injections.  She states that her BP had mostly been well controlled in the 110s/70s-80s. This is confirmed in clinic today. However, she does have occasional blood pressures in the 130s-140s, which also prompts her to take a diuretic. Her usual  indication of needing a diuretic pill is noticing bursitic swelling in her ankles.  For exercise she is walking every day 30 minutes. Typically she drinks a cup of coffee, and is working at a computer. She also notes that she is sleep-deprived; she may eat dinner and go to bed at very late times.   She denies any shortness of breath, lightheadedness, headaches, syncope, orthopnea, or PND.   Past Medical History:  Diagnosis Date   Acne    Anemia    history of   Anxiety    Arthritis    Depression    GERD (gastroesophageal reflux disease)    worse while pregnant   Headache    Migraines   Heart murmur    ? heart murmur in past   History of blood transfusion 2013   Hypertension    Metabolic syndrome     Past Surgical History:  Procedure Laterality Date   ANTERIOR FUSION CERVICAL SPINE  2018   CESAREAN SECTION     x 2   CESAREAN SECTION  09/29/2012   Procedure: CESAREAN SECTION;  Surgeon: Luz Lex, MD;  Location: [facility];  Service: Obstetrics;  Laterality: N/A;  Repeat edc 10/12/12/REQUEST;Chassity,Dee,Colleen   COLONOSCOPY     ESOPHAGOGASTRODUODENOSCOPY  normal    7/12   JOINT REPLACEMENT Bilateral    LAPAROSCOPIC VAGINAL HYSTERECTOMY WITH SALPINGECTOMY Bilateral 02/23/2016   Procedure: LAPAROSCOPIC ASSISTED VAGINAL HYSTERECTOMY WITH bilateral SALPINGECTOMY, right oophorectomy. laporotic repair of incidental cystotomy.;  Surgeon: Shanon Brow  Corinna Capra, MD;  Location: [facility];  Service: Gynecology;  Laterality: Bilateral;   MOUTH SURGERY      Current Medications: Current Outpatient Medications on File Prior to Visit  Medication Sig   ALPRAZolam (XANAX) 0.5 MG tablet alprazolam 0.5 mg tablet  TAKE 1 TABLET BY MOUTH THREE TIMES A DAY AS NEEDED FOR ANXIETY   AMBULATORY NON FORMULARY MEDICATION Medication Name: nitroglycerin 0.125 % twice daily for 6-8 weeks   buPROPion (WELLBUTRIN XL) 300 MG 24 hr tablet Take 300 mg by mouth every morning.   busPIRone (BUSPAR) 15 MG tablet TAKE 1  TABLET BY MOUTH TWICE A DAY   ezetimibe (ZETIA) 10 MG tablet TAKE 1 TABLET BY MOUTH EVERY DAY   Insulin Pen Needle (B-D ULTRAFINE III SHORT PEN) 31G X 8 MM MISC USE AS DIRECTED TO INJECT SAXENDA   metFORMIN (GLUCOPHAGE) 500 MG tablet TAKE 1 TABLET BY MOUTH 2 TIMES DAILY WITH A MEAL.   SAXENDA 18 MG/3ML SOPN INJECT 3 MG INTO THE SKIN DAILY.   Turmeric 500 MG CAPS Take 1 capsule by mouth 2 (two) times daily.   VITAMIN D PO Take 1,000 Units by mouth 2 (two) times daily.   zolpidem (AMBIEN) 5 MG tablet Take 5 mg by mouth at bedtime as needed for sleep.   aspirin 81 MG tablet Take 81 mg by mouth every other day.  (Patient not taking: Reported on 11/26/2022)   No current facility-administered medications on file prior to visit.     Allergies:   Azithromycin   Social History   Tobacco Use   Smoking status: Never   Smokeless tobacco: Never  Vaping Use   Vaping Use: Never used  Substance Use Topics   Alcohol use: Yes    Comment: 1 glass of wine per week prior to preg   Drug use: No   Background in nursing, very high health literacy.   Family History: The patient's family history includes Cancer in her father; Depression in her mother; Diabetes in her mother; Hydrocephalus in her sister; Hypertension in her mother; Kidney disease in her father; Obesity in her mother; Thyroid disease in her mother. There is no history of Colon cancer, Esophageal cancer, Liver cancer, Pancreatic cancer, Rectal cancer, or Stomach cancer. Mom had heart disease in her late 19s, had a heart cath and stent last year, had diabetes and hypertension. No early CAD. Mother passed away, was on hospice. Had a history of CAD. Aunt with a stroke. Both were smokers. No premature CAD. Discussed ASCVD risk, statins, etc at length.   ROS:   Please see the history of present illness.   (+) Chest pain/discomfort (+) Intermittent LE edema (+) Palpitations Additional pertinent ROS otherwise unremarkable  EKGs/Labs/Other Studies  Reviewed:    The following studies were reviewed today:  CTA Head/Neck  02/20/2022: IMPRESSION: 1.  No acute intracranial process. 2.  No intracranial large vessel occlusion or significant stenosis. 3.  No hemodynamically significant stenosis in the neck.  EKG:  EKG is personally reviewed today.   12/31/2022:  NSR at 73 bpm 10/02/2020:  NSR at 91 bpm.  Recent Labs: 02/20/2022: Magnesium 1.8 11/05/2022: TSH 1.33 11/26/2022: ALT 11; BUN 13; Creatinine 1.07; Hemoglobin 13.5; Platelet Count 476; Potassium 3.9; Sodium 138   Recent Lipid Panel    Component Value Date/Time   CHOL 221 (H) 11/05/2022 0744   TRIG 210.0 (H) 11/05/2022 0744   HDL 48.10 11/05/2022 0744   CHOLHDL 5 11/05/2022 0744   VLDL 42.0 (H) 11/05/2022 0744  LDLCALC 131 (H) 01/15/2022 0755   LDLCALC 95 10/27/2020 1019   LDLDIRECT 130.0 11/05/2022 0744    Physical Exam:    VS:  BP 118/62 (BP Location: Left Arm, Patient Position: Sitting, Cuff Size: Normal)   Pulse 73   Ht '5\' 2"'$  (1.575 m)   Wt 171 lb 1.6 oz (77.6 kg)   LMP 02/11/2016 (Exact Date)   BMI 31.29 kg/m     Wt Readings from Last 3 Encounters:  12/31/22 171 lb 1.6 oz (77.6 kg)  11/26/22 167 lb 12.8 oz (76.1 kg)  11/09/22 169 lb (76.7 kg)    GEN: Well nourished, well developed in no acute distress HEENT: Normal, moist mucous membranes NECK: No JVD CARDIAC: regular rhythm, normal S1 and S2, no rubs or gallops. No murmur. VASCULAR: Radial and DP pulses 2+ bilaterally. No carotid bruits RESPIRATORY:  Clear to auscultation without rales, wheezing or rhonchi  ABDOMEN: Soft, non-tender, non-distended MUSCULOSKELETAL:  Ambulates independently SKIN: Warm and dry, trivial LE edema, trivial bursitic edema of ankles NEUROLOGIC:  Alert and oriented x 3. No focal neuro deficits noted. PSYCHIATRIC:  Normal affect   ASSESSMENT:    1. Atypical chest pain   2. Essential hypertension   3. Cardiac risk counseling   4. Counseling on health promotion and disease  prevention     PLAN:    Chest pain: atypical components,  -previously evaluated in ER, noted to have normal hsTn, improved with antiinflammatories -unlikely to be cardiac in etiology at this time. We discussed ways to see if it is GI (trying antacids) or MSK (trialing antiinflammatories). If her symptoms worsen and there is no clear alleviating factors, she will contact me and we will review options for further testing -counseled on red flag warning signs that need immediate medical attention  Hypertension:  -at goal today, not currently on antihypertensives (previously on HCTZ and losartan)  CV risk counseling and primary prevention:  -we discussed the data for statins vs zetia. She has been on statins previously -Her ASCVD risk is very low, already on aspirin 81 mg per her choice. No indication for preventative medication at this time. If her risk score increases, or if she develops diabetes in the future, would recommend a statin over baby aspirin for prevention. -recommend heart healthy/Mediterranean diet, with whole grains, fruits, vegetable, fish, lean meats, nuts, and olive oil. Limit salt. -recommend moderate walking, 3-5 times/week for 30-50 minutes each session. Aim for at least 150 minutes.week. Goal should be pace of 3 miles/hours, or walking 1.5 miles in 30 minutes -recommend avoidance of tobacco products. Avoid excess alcohol. -ASCVD risk score: The 10-year ASCVD risk score (Arnett DK, et al., 2019) is: 3%   Values used to calculate the score:     Age: 3 years     Sex: Female     Is Non-Hispanic African American: No     Diabetic: Yes     Tobacco smoker: No     Systolic Blood Pressure: 761 mmHg     Is BP treated: No     HDL Cholesterol: 48.1 mg/dL     Total Cholesterol: 221 mg/dL  Plan for follow up: 6 months or sooner as needed.  Buford Dresser, MD, PhD [place]  [facility]   Medication Adjustments/Labs and Tests Ordered: Current medicines are  reviewed at length with the patient today.  Concerns regarding medicines are outlined above.   Orders Placed This Encounter  Procedures   EKG 12-Lead   No orders of the defined  types were placed in this encounter.  Patient Instructions  Medication Instructions:  Your physician recommends that you continue on your current medications as directed. Please refer to the Current Medication list given to you today.  *If you need a refill on your cardiac medications before your next appointment, please call your pharmacy*  Lab Work: NONE  Testing/Procedures: NONE  Follow-Up: At [facility], you and your health needs are our priority.  As part of our continuing mission to provide you with exceptional heart care, we have created designated Provider Care Teams.  These Care Teams include your primary Cardiologist (physician) and Advanced Practice Providers (APPs -  Physician Assistants and Nurse Practitioners) who all work together to provide you with the care you need, when you need it.  We recommend signing up for the patient portal called "MyChart".  Sign up information is provided on this After Visit Summary.  MyChart is used to connect with patients for Virtual Visits (Telemedicine).  Patients are able to view lab/test results, encounter notes, upcoming appointments, etc.  Non-urgent messages can be sent to your provider as well.   To learn more about what you can do with MyChart, go to [email or web address].    Your next appointment:   6 month(s)  The format for your next appointment:   In Person  Provider:   Buford Dresser, MD      [facility],acting as a scribe for Buford Dresser, MD.,have documented all relevant documentation on the behalf of Buford Dresser, MD,as directed by  Buford Dresser, MD while in the presence of Buford Dresser, MD.  I, Buford Dresser, MD, have reviewed all documentation for this visit. The  documentation on 01/02/23 for the exam, diagnosis, procedures, and orders are all accurate and complete.   Signed, Buford Dresser, MD PhD 01/02/2023 9:05 PM    [facility]

## 2022-12-31 ENCOUNTER — Ambulatory Visit (INDEPENDENT_AMBULATORY_CARE_PROVIDER_SITE_OTHER): Payer: BC Managed Care – PPO | Admitting: Cardiology

## 2022-12-31 ENCOUNTER — Encounter (HOSPITAL_BASED_OUTPATIENT_CLINIC_OR_DEPARTMENT_OTHER): Payer: Self-pay | Admitting: Cardiology

## 2022-12-31 VITALS — BP 118/62 | HR 73 | Ht 62.0 in | Wt 171.1 lb

## 2022-12-31 DIAGNOSIS — Z7189 Other specified counseling: Secondary | ICD-10-CM

## 2022-12-31 DIAGNOSIS — R0789 Other chest pain: Secondary | ICD-10-CM

## 2022-12-31 DIAGNOSIS — I10 Essential (primary) hypertension: Secondary | ICD-10-CM

## 2022-12-31 NOTE — Patient Instructions (Signed)
Medication Instructions:  Your physician recommends that you continue on your current medications as directed. Please refer to the Current Medication list given to you today.   *If you need a refill on your cardiac medications before your next appointment, please call your pharmacy*  Lab Work: NONE  Testing/Procedures: NONE   Follow-Up: At [facility], you and your health needs are our priority.  As part of our continuing mission to provide you with exceptional heart care, we have created designated Provider Care Teams.  These Care Teams include your primary Cardiologist (physician) and Advanced Practice Providers (APPs -  Physician Assistants and Nurse Practitioners) who all work together to provide you with the care you need, when you need it.  We recommend signing up for the patient portal called "MyChart".  Sign up information is provided on this After Visit Summary.  MyChart is used to connect with patients for Virtual Visits (Telemedicine).  Patients are able to view lab/test results, encounter notes, upcoming appointments, etc.  Non-urgent messages can be sent to your provider as well.   To learn more about what you can do with MyChart, go to [email or web address].    Your next appointment:   6 month(s)  The format for your next appointment:   In Person  Provider:   Bridgette Christopher, MD             

## 2023-01-05 DIAGNOSIS — L700 Acne vulgaris: Secondary | ICD-10-CM | POA: Diagnosis not present

## 2023-01-05 DIAGNOSIS — L819 Disorder of pigmentation, unspecified: Secondary | ICD-10-CM | POA: Diagnosis not present

## 2023-01-05 DIAGNOSIS — B009 Herpesviral infection, unspecified: Secondary | ICD-10-CM | POA: Diagnosis not present

## 2023-02-22 DIAGNOSIS — H43812 Vitreous degeneration, left eye: Secondary | ICD-10-CM | POA: Diagnosis not present

## 2023-03-18 ENCOUNTER — Other Ambulatory Visit: Payer: Self-pay | Admitting: Medical

## 2023-03-21 ENCOUNTER — Telehealth: Payer: BC Managed Care – PPO | Admitting: Physician Assistant

## 2023-03-21 DIAGNOSIS — M542 Cervicalgia: Secondary | ICD-10-CM | POA: Diagnosis not present

## 2023-03-21 MED ORDER — CYCLOBENZAPRINE HCL 10 MG PO TABS: 5.0000 mg | ORAL_TABLET | Freq: Three times a day (TID) | ORAL | 0 refills | Status: DC | PRN

## 2023-03-21 MED ORDER — NAPROXEN 500 MG PO TABS: 500.0000 mg | ORAL_TABLET | Freq: Two times a day (BID) | ORAL | 0 refills | Status: DC

## 2023-03-21 MED ORDER — TIZANIDINE HCL 2 MG PO TABS: 2.0000 mg | ORAL_TABLET | Freq: Three times a day (TID) | ORAL | 0 refills | Status: DC | PRN

## 2023-03-21 NOTE — Progress Notes (Signed)

## 2023-03-21 NOTE — Addendum Note (Signed)
Addended by: Mar Daring on: 03/21/2023 11:55 AM   Modules accepted: Orders

## 2023-03-29 ENCOUNTER — Ambulatory Visit (INDEPENDENT_AMBULATORY_CARE_PROVIDER_SITE_OTHER): Payer: BC Managed Care – PPO | Admitting: Medical

## 2023-03-29 VITALS — BP 130/80 | HR 75 | Resp 18 | Ht 62.0 in | Wt 175.0 lb

## 2023-03-29 DIAGNOSIS — M542 Cervicalgia: Secondary | ICD-10-CM

## 2023-03-29 DIAGNOSIS — M5410 Radiculopathy, site unspecified: Secondary | ICD-10-CM | POA: Diagnosis not present

## 2023-03-29 MED ORDER — METHYLPREDNISOLONE 4 MG PO TABS: ORAL_TABLET | ORAL | 0 refills | Status: DC

## 2023-03-29 NOTE — Patient Instructions (Addendum)
For neck pain with tingling to fingers(probable radicular type pain) placed referral to [facility] as you had former surgery with Dr. Saintclair Halsted.  Pending referral discontinue naproxen. Sent in medrol taper course. Can continue zanaflex as well.  If pain worsens or changes pending referral to Dr. Saintclair Halsted.  Follow up date to be determined depending on appointment date and how you respond to treatment.

## 2023-03-29 NOTE — Progress Notes (Signed)
Subjective:    Patient ID: Ann Frederick, female    DOB: 07/11/1971, 52 y.o.   MRN: [id number]  HPI  3 weeks ago she state her neck pain stiffened up. No particular injury. She states pain got worse and gradually worsened. She states originally tried one robaxin but did not help. She did e type visit before trip to [place]. Pt did e type visit with insurance. She got naproxen, zanaflex and it helped some briefly(she states also had flexeril but did not use muscle relaxants at same time together). But after about one week started to get tinging to finger. With particular movements turning neck states feels minimal tingly.   Pt states some tingling in feet are very rare. No weakness of upper or lower extremities.   05-2018 sx IMPRESSION: Spinal stenosis from C3-4 through C6-7 due to osteophytes and disc herniations with narrowing of the canal and deformity of the cord. Although there is no abnormal T2 signal in the cord at this time, these findings would place the patient at significant risk of myelopathy and decompression should be considered. Foraminal encroachment could affect the exiting nerve roots at C3-4 bilaterally, C5-6 right worse than left, and especially at C6-7 on the right where there is a strongly right-sided predominant disc herniation with foraminal extension. See above for full discussion.   Pt had neck surgery in the past.  2/10 pain presently     Review of Systems  Constitutional:  Negative for chills, fatigue and fever.  Respiratory:  Negative for cough, chest tightness, shortness of breath and wheezing.   Cardiovascular:  Negative for chest pain and palpitations.  Gastrointestinal:  Negative for abdominal pain, blood in stool and diarrhea.  Musculoskeletal:  Positive for neck pain. Negative for back pain, joint swelling and neck stiffness.  Skin:  Negative for rash.  Neurological:  Negative for dizziness, seizures and light-headedness.  Hematological:   Negative for adenopathy. Does not bruise/bleed easily.  Psychiatric/Behavioral:  Negative for behavioral problems, decreased concentration and dysphoric mood.     Past Medical History:  Diagnosis Date   Acne    Anemia    history of   Anxiety    Arthritis    Depression    GERD (gastroesophageal reflux disease)    worse while pregnant   Headache    Migraines   Heart murmur    ? heart murmur in past   History of blood transfusion 2013   Hypertension    Metabolic syndrome      Social History   Socioeconomic History   Marital status: Married    Spouse name: Not on file   Number of children: 2   Years of education: Not on file   Highest education level: Master's degree (e.g., MA, MS, MEng, MEd, MSW, MBA)  Occupational History   Not on file  Tobacco Use   Smoking status: Never   Smokeless tobacco: Never  Vaping Use   Vaping Use: Never used  Substance and Sexual Activity   Alcohol use: Yes    Comment: 1 glass of wine per week prior to preg   Drug use: No   Sexual activity: Yes  Other Topics Concern   Not on file  Social History Narrative   Not on file   Social Determinants of Health   Financial Resource Strain: Low Risk  (03/25/2023)   Overall Financial Resource Strain (CARDIA)    Difficulty of Paying Living Expenses: Not hard at all  Food Insecurity: No Food  Insecurity (03/25/2023)   Hunger Vital Sign    Worried About Running Out of Food in the Last Year: Never true    [place] in the Last Year: Never true  Transportation Needs: No Transportation Needs (03/25/2023)   PRAPARE - Hydrologist (Medical): No    Lack of Transportation (Non-Medical): No  Physical Activity: Insufficiently Active (03/25/2023)   Exercise Vital Sign    Days of Exercise per Week: 3 days    Minutes of Exercise per Session: 30 min  Stress: Stress Concern Present (03/25/2023)   [place]     Feeling of Stress : Rather much  Social Connections: Socially Integrated (03/25/2023)   Social Connection and Isolation Panel [NHANES]    Frequency of Communication with Friends and Family: More than three times a week    Frequency of Social Gatherings with Friends and Family: Once a week    Attends Religious Services: More than 4 times per year    Active Member of Clubs or Organizations: Yes    Attends Music therapist: More than 4 times per year    Marital Status: Married  Human resources officer Violence: Not on file    Past Surgical History:  Procedure Laterality Date   [place]     x 2   CESAREAN SECTION  09/29/2012   Procedure: CESAREAN SECTION;  Surgeon: Luz Lex, MD;  Location: [facility];  Service: Obstetrics;  Laterality: N/A;  Repeat edc 10/12/12/REQUEST;Chassity,Dee,Colleen   COLONOSCOPY     ESOPHAGOGASTRODUODENOSCOPY  normal    7/12   JOINT REPLACEMENT Bilateral    LAPAROSCOPIC VAGINAL HYSTERECTOMY WITH SALPINGECTOMY Bilateral 02/23/2016   Procedure: LAPAROSCOPIC ASSISTED VAGINAL HYSTERECTOMY WITH bilateral SALPINGECTOMY, right oophorectomy. laporotic repair of incidental cystotomy.;  Surgeon: Louretta Shorten, MD;  Location: [facility];  Service: Gynecology;  Laterality: Bilateral;   MOUTH SURGERY      Family History  Problem Relation Age of Onset   Depression Mother    Hypertension Mother    Diabetes Mother    Obesity Mother    Thyroid disease Mother    Cancer Father        ? lung CA   Kidney disease Father    Hydrocephalus Sister    Colon cancer Neg Hx    Esophageal cancer Neg Hx    Liver cancer Neg Hx    Pancreatic cancer Neg Hx    Rectal cancer Neg Hx    Stomach cancer Neg Hx     Allergies  Allergen Reactions   Azithromycin Nausea And Vomiting    diarrhea    Current Outpatient Medications on File Prior to Visit  Medication Sig Dispense Refill   ALPRAZolam (XANAX) 0.5 MG tablet alprazolam 0.5 mg  tablet  TAKE 1 TABLET BY MOUTH THREE TIMES A DAY AS NEEDED FOR ANXIETY     AMBULATORY NON FORMULARY MEDICATION Medication Name: nitroglycerin 0.125 % twice daily for 6-8 weeks 1 Tube 3   aspirin 81 MG tablet Take 81 mg by mouth every other day.  (Patient not taking: Reported on 11/26/2022)     buPROPion (WELLBUTRIN XL) 300 MG 24 hr tablet Take 300 mg by mouth every morning.     busPIRone (BUSPAR) 15 MG tablet TAKE 1 TABLET BY MOUTH TWICE A DAY 180 tablet 2   ezetimibe (ZETIA) 10 MG tablet TAKE 1 TABLET BY MOUTH EVERY DAY 90 tablet  3   Insulin Pen Needle (B-D ULTRAFINE III SHORT PEN) 31G X 8 MM MISC USE AS DIRECTED TO INJECT SAXENDA 100 each 3   metFORMIN (GLUCOPHAGE) 500 MG tablet TAKE 1 TABLET BY MOUTH TWICE A DAY WITH FOOD 180 tablet 0   naproxen (NAPROSYN) 500 MG tablet Take 1 tablet (500 mg total) by mouth 2 (two) times daily with a meal. 30 tablet 0   SAXENDA 18 MG/3ML SOPN INJECT 3 MG INTO THE SKIN DAILY. 3 mL 2   tiZANidine (ZANAFLEX) 2 MG tablet Take 1 tablet (2 mg total) by mouth every 8 (eight) hours as needed for muscle spasms. 30 tablet 0   Turmeric 500 MG CAPS Take 1 capsule by mouth 2 (two) times daily.     VITAMIN D PO Take 1,000 Units by mouth 2 (two) times daily.     zolpidem (AMBIEN) 5 MG tablet Take 5 mg by mouth at bedtime as needed for sleep.     No current facility-administered medications on file prior to visit.    BP 130/80   Pulse 75   Resp 18   Ht 5\' 2"  (1.575 m)   Wt 175 lb (79.4 kg)   LMP 02/11/2016 (Exact Date)   SpO2 98%   BMI 32.01 kg/m        Objective:   Physical Exam  General Mental Status- Alert. General Appearance- Not in acute distress.   Skin General: Color- Normal Color. Moisture- Normal Moisture.  Neck Carotid Arteries- Normal color. Moisture- Normal Moisture. No carotid bruits. No JVD.  Chest and Lung Exam Auscultation: Breath Sounds:-Normal.  Cardiovascular Auscultation:Rythm- Regular. Murmurs & Other Heart Sounds:Auscultation  of the heart reveals- No Murmurs.  Abdomen Inspection:-Inspeection Normal. Palpation/Percussion:Note:No mass. Palpation and Percussion of the abdomen reveal- Non Tender, Non Distended + BS, no rebound or guarding.   Neurologic Cranial Nerve exam:- CN III-XII intact(No nystagmus), symmetric smile. Strength:- 5/5 equal and symmetric strength both upper and lower extremities.  Sharp and dull disrcimination intact upper ext. But sharp sensation rt hand not as prominent compared to left side.     Assessment & Plan:   Patient Instructions  For neck pain with tingling to fingers(probable radicular type pain) placed referral to [facility] as you had former surgery with Dr. Saintclair Halsted.  Pending referral discontinue naproxen. Sent in medrol taper course. Can continue zanaflex as well.  If pain worsens or changes pending referral to Dr. Saintclair Halsted.  Follow up date to be determined depending on appointment date and how you respond to treatment.     Mackie Pai, PA-C

## 2023-04-05 DIAGNOSIS — M542 Cervicalgia: Secondary | ICD-10-CM | POA: Diagnosis not present

## 2023-04-06 ENCOUNTER — Telehealth (INDEPENDENT_AMBULATORY_CARE_PROVIDER_SITE_OTHER): Payer: BC Managed Care – PPO | Admitting: Medical

## 2023-04-06 ENCOUNTER — Encounter: Payer: Self-pay | Admitting: Medical

## 2023-04-06 VITALS — BP 127/89 | HR 86

## 2023-04-06 DIAGNOSIS — M542 Cervicalgia: Secondary | ICD-10-CM | POA: Diagnosis not present

## 2023-04-06 DIAGNOSIS — M5410 Radiculopathy, site unspecified: Secondary | ICD-10-CM

## 2023-04-06 MED ORDER — METHOCARBAMOL 750 MG PO TABS: 750.0000 mg | ORAL_TABLET | Freq: Three times a day (TID) | ORAL | 0 refills | Status: DC | PRN

## 2023-04-06 MED ORDER — TRAMADOL HCL 50 MG PO TABS: 50.0000 mg | ORAL_TABLET | Freq: Four times a day (QID) | ORAL | 0 refills | Status: AC | PRN

## 2023-04-06 NOTE — Patient Instructions (Signed)
Neck pain with upper extremity radiculopathy.  Prior neck surgery with equipment.  Recently seen by neurosurgeon who ordered MRI and that is pending.  Symptoms seem to worsen as finished recent prednisone taper.  Presently recommend naproxen 500 mg twice daily, Robaxin and tramadol.  Rx advisement given on meds.  If signs and symptoms worsen pending MRI please notify me.  Follow-up date to be determined pending MRI review.  I think I will get MRI update relatively quickly as I did receive neurosurgeon office note quickly.

## 2023-04-06 NOTE — Progress Notes (Signed)
   Subjective:    Patient ID: Ann Frederick, female    DOB: August 30, 1971, 52 y.o.   MRN: [id number]  HPI  Virtual Visit via Video Note  I connected with Ann Frederick on 04/06/23 at  1:40 PM EDT by a video enabled telemedicine application and verified that I am speaking with the correct person using two identifiers.  Location: Patient: home Provider: office   I discussed the limitations of evaluation and management by telemedicine and the availability of in person appointments. The patient expressed understanding and agreed to proceed.  History of Present Illness:  Pt has constant neck pain with numbness ad tingling to her upper ext. Her symptoms seemed to get better with prednisone but when she stopped the taper her symptoms came back.  Pt saw neurosurgeon and she has repeat mri pending.  Pt has been using naproxyn  500 mg twice daily and it does help but not as much as prendisone.   Flexeril excessively sedates her. Pt states robaxin does not excessively sedate her.   Pt willing to use try naproxyn, robaxin and add on tramadol.      Observations/Objective: General-no acute distress, pleasant, oriented. Lungs- on inspection lungs appear unlabored. Neck- no tracheal deviation or jvd on inspection. Neuro- gross motor function appears intact.   Assessment and Plan: Patient Instructions  Neck pain with upper extremity radiculopathy.  Prior neck surgery with equipment.  Recently seen by neurosurgeon who ordered MRI and that is pending.  Symptoms seem to worsen as finished recent prednisone taper.  Presently recommend naproxen 500 mg twice daily, Robaxin and tramadol.  Rx advisement given on meds.  If signs and symptoms worsen pending MRI please notify me.  Follow-up date to be determined pending MRI review.  I think I will get MRI update relatively quickly as I did receive neurosurgeon office note quickly.   Esperanza Richters, PA-C    Follow Up Instructions:    I discussed  the assessment and treatment plan with the patient. The patient was provided an opportunity to ask questions and all were answered. The patient agreed with the plan and demonstrated an understanding of the instructions.   The patient was advised to call back or seek an in-person evaluation if the symptoms worsen or if the condition fails to improve as anticipated.     Esperanza Richters, PA-C   Review of Systems  Constitutional:  Negative for chills, fatigue and fever.  Respiratory:  Negative for cough and chest tightness.   Cardiovascular:  Negative for chest pain and palpitations.  Gastrointestinal:  Negative for abdominal pain.  Musculoskeletal:  Positive for neck pain.       Radicular pain.  Skin:  Negative for rash.  Neurological:  Negative for dizziness and headaches.  Hematological:  Negative for adenopathy. Does not bruise/bleed easily.  Psychiatric/Behavioral:  Negative for behavioral problems.        Objective:   Physical Exam        Assessment & Plan:

## 2023-04-11 ENCOUNTER — Encounter: Payer: Self-pay | Admitting: *Deleted

## 2023-04-19 DIAGNOSIS — M542 Cervicalgia: Secondary | ICD-10-CM | POA: Diagnosis not present

## 2023-04-19 DIAGNOSIS — M5020 Other cervical disc displacement, unspecified cervical region: Secondary | ICD-10-CM | POA: Diagnosis not present

## 2023-04-26 DIAGNOSIS — Z6831 Body mass index (BMI) 31.0-31.9, adult: Secondary | ICD-10-CM | POA: Diagnosis not present

## 2023-04-26 DIAGNOSIS — M542 Cervicalgia: Secondary | ICD-10-CM | POA: Diagnosis not present

## 2023-05-05 ENCOUNTER — Other Ambulatory Visit: Payer: Self-pay

## 2023-05-05 ENCOUNTER — Ambulatory Visit: Payer: BC Managed Care – PPO | Attending: Neurosurgery

## 2023-05-05 DIAGNOSIS — R293 Abnormal posture: Secondary | ICD-10-CM | POA: Insufficient documentation

## 2023-05-05 DIAGNOSIS — M542 Cervicalgia: Secondary | ICD-10-CM | POA: Insufficient documentation

## 2023-05-05 DIAGNOSIS — M25611 Stiffness of right shoulder, not elsewhere classified: Secondary | ICD-10-CM

## 2023-05-05 NOTE — Therapy (Signed)
OUTPATIENT PHYSICAL THERAPY CERVICAL EVALUATION   Patient Name: Ann Frederick MRN: [id number] DOB:28-Oct-1971, 52 y.o., female Today's Date: 05/05/2023  END OF SESSION:  PT End of Session - 05/05/23 0843     Visit Number 1    Date for PT Re-Evaluation 07/28/23    PT Start Time 0843    PT Stop Time 0930    PT Time Calculation (min) 47 min    Activity Tolerance Patient tolerated treatment well    Behavior During Therapy [facility] for tasks assessed/performed             Past Medical History:  Diagnosis Date   Acne    Anemia    history of   Anxiety    Arthritis    Depression    GERD (gastroesophageal reflux disease)    worse while pregnant   Headache    Migraines   Heart murmur    ? heart murmur in past   History of blood transfusion 2013   Hypertension    Metabolic syndrome    Past Surgical History:  Procedure Laterality Date   ANTERIOR FUSION CERVICAL SPINE  2018   CESAREAN SECTION     x 2   CESAREAN SECTION  09/29/2012   Procedure: CESAREAN SECTION;  Surgeon: Turner Daniels, MD;  Location: [facility];  Service: Obstetrics;  Laterality: N/A;  Repeat edc 10/12/12/REQUEST;Chassity,Dee,Colleen   COLONOSCOPY     ESOPHAGOGASTRODUODENOSCOPY  normal    7/12   JOINT REPLACEMENT Bilateral    LAPAROSCOPIC VAGINAL HYSTERECTOMY WITH SALPINGECTOMY Bilateral 02/23/2016   Procedure: LAPAROSCOPIC ASSISTED VAGINAL HYSTERECTOMY WITH bilateral SALPINGECTOMY, right oophorectomy. laporotic repair of incidental cystotomy.;  Surgeon: Candice Camp, MD;  Location: [facility];  Service: Gynecology;  Laterality: Bilateral;   MOUTH SURGERY     Patient Active Problem List   Diagnosis Date Noted   Left hip pain 08/27/2019   Class 1 obesity due to excess calories without serious comorbidity with body mass index (BMI) of 34.0 to 34.9 in adult 11/28/2018   Heart palpitations 11/28/2018   OSA (obstructive sleep apnea) 09/14/2018   Neck pain 04/26/2018   Wellness examination 03/08/2016   S/P laparoscopic  assisted vaginal hysterectomy (LAVH) 02/23/2016   Pink eye 06/19/2015   Pain of right thumb 06/19/2015   Sinusitis, acute frontal 02/05/2015   Headache around the eyes 02/05/2015   Back pain 11/01/2014   Routine general medical examination at a health care facility 10/30/2013   Vesicular rash 09/07/2013   Sinusitis 02/02/2013   Viral pharyngitis 03/03/2012   Flank pain 08/13/2011   Inguinal adenopathy 08/13/2011   Dysphagia 04/20/2011   GERD 01/29/2011   FATIGUE 01/29/2011   SCIATICA, BILATERAL 06/18/2010   LEG PAIN, BILATERAL 06/06/2010   HIP PAIN, LEFT 04/01/2010   MASS, LOCALIZED, SUPERFICIAL 03/05/2009   ACNE VULGARIS 07/11/2007   MURMUR 07/11/2007    PCP: Carolin Guernsey PROVIDER: Donalee Citrin  REFERRING DIAG: cervicalgia  THERAPY DIAG:  Cervicalgia  Stiffness of right shoulder, not elsewhere classified  Abnormal posture  Rationale for Evaluation and Treatment: Rehabilitation  ONSET DATE: 03/16/23  SUBJECTIVE:  SUBJECTIVE STATEMENT: 6 weeks ago L sided stiff neck occiput to L upper traps, really painful, locked up eventually, then switched to R side, developed numbness dorsal hands, fingers.  Took naproxen which worked, flexiril works as well now sore and tingling R C 7/7 area, hand and forearm, no specific pattern noted . Sometimes interferes with sleep, wakes her up, difficulty going to sleep  Hand dominance: Right  PERTINENT HISTORY:  H/o cervical fusion C 3 to C7 2018, also hip replacements R 2023  PAIN:  Are you having pain? Yes: NPRS scale: 3/10 Pain location: R upper traps region Pain description: aching, sore, locks up Aggravating factors: no specific pattern Relieving factors: meds somewhat  PRECAUTIONS: None  WEIGHT BEARING RESTRICTIONS:  No  FALLS:  Has patient fallen in last 6 months? 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  OBJECTIVE:   DIAGNOSTIC FINDINGS:  Per neurologist's notes some spinal stenosis above and below surgical sites, not encroaching spinal cord  PATIENT SURVEYS:  FOTO 51 neck  COGNITION: Overall cognitive status: Within functional limits for tasks assessed  SENSATION: Reports tingling/ aching along radial nerve pathway, C7.  No sensory deficits noted  POSTURE: in standing R iliac crest elevated approx. 2 cm, R scapula and shoulder depressed. Mild concavity R lumbar region, mild R lat shift pelvis.  Increased thoracic kyphosis noted mid thoracic T 4 to T 8 region  PALPATION: Point tender R upper traps,R  supraspinatus, R infraspinatus,decreased PA mobility noted R post ribs as compared to L  CERVICAL ROM:   Active ROM A/PROM (deg) eval  Flexion 50  Extension 50  Right lateral flexion nt  Left lateral flexion nt  Right rotation 45  Left rotation 45   (Blank rows = not tested)  UPPER EXTREMITY ROM: All UE ROM wfl, discomfort/"soreness" noted by patient R side transitioning from R shoulder abduction to rest position and with IR behind back   UPPER EXTREMITY MMT:  MMT Right eval Left eval  Shoulder flexion 5 5  Shoulder extension 5 5  Shoulder abduction 5 5  Shoulder adduction 5 5  Shoulder extension 5 5  Shoulder internal rotation 4 4  Shoulder external rotation 5 5  Middle trapezius 4 4  Lower trapezius 3+ 4-  Elbow flexion 5 5  Elbow extension 5 5  Wrist flexion    Wrist extension    Wrist ulnar deviation    Wrist radial deviation    Wrist pronation    Wrist supination    Grip strength     (Blank rows = not tested)   TODAY'S TREATMENT:  DATE: 05/05/23  Manual:  prone for gentle PA gr 2 mobs B mid post ribs Trigger Point Dry-Needling  Treatment instructions: Expect mild to moderate muscle soreness. S/S of pneumothorax if dry needled over a lung field, and to seek immediate medical attention should they occur. Patient verbalized understanding of these instructions and education. Patient Consent Given: Yes Education handout provided: No Muscles treated: R upper traps, R infraspinatus Electrical stimulation performed: No Parameters: N/A Treatment response/outcome: Twitch Response Elicited   Instructed in therex as described below, also patient education in evaluating posture at her work station, also elongating L step length and trying to maintain equal stride length B, noted slight shortening L stride length and decreased R hip ext movement terminal stance. PATIENT EDUCATION:  Education details: POC, Goals Person educated: Patient Education method: Explanation, Demonstration, Tactile cues, Verbal cues, and Handouts Education comprehension: verbalized understanding, returned demonstration, and verbal cues required  HOME EXERCISE PROGRAM: Access Code: [email or web address] URL: [email or web address] Date: 05/05/2023 Prepared by: Jeris Easterly  Exercises - Seated Shoulder Horizontal Abduction with Resistance - Palms Down  - 3 x daily - 7 x weekly - 1 sets - 10 reps - Standing Shoulder External Rotation with Resistance  - 3 x daily - 7 x weekly - 1 sets - 10 reps - Hip Flexor Stretch at Edge of Bed  - 3 x daily - 7 x weekly - 1 sets - 10 reps  ASSESSMENT:  CLINICAL IMPRESSION: Patient is a 52 y.o. female who was seen today for physical therapy evaluation and treatment for cervicalgia. PMH significant for multilevel fusion C3 to C7 as well as B hip replacements, R side one year ago.  Presents with "locking of cervical spine  ", originally on L, now shifter to R.  Consistent ache, some radicular Sx R hand, restricting sleeping quite a lot.  MMT revealed weakness B shoulder IR, lower traps,and middle traps. Stiff with cervical spine movements all planes.  Also some R hip flexor/ant hip tightness noted.  Will benefit from skilled PT to address her Sx and assist her with long term management.  OBJECTIVE IMPAIRMENTS: decreased ROM, decreased strength, hypomobility, impaired flexibility, impaired UE functional use, and pain.   ACTIVITY LIMITATIONS: lifting, sleeping, and reach over head  PARTICIPATION LIMITATIONS: community activity and yard work  PERSONAL FACTORS: Age, Past/current experiences, Profession, Time since onset of injury/illness/exacerbation, and 1-2 comorbidities: B THA, previous c spine fusion  are also affecting patient's functional outcome.   REHAB POTENTIAL: Good  CLINICAL DECISION MAKING: Stable/uncomplicated  EVALUATION COMPLEXITY: Low   GOALS: Goals reviewed with patient? Yes GOALS: Goals reviewed with patient? Yes  SHORT TERM GOALS: Target date: 05/19/23 Patient will be independent with initial HEP.  Baseline: initiated at eval Goal status: INITIAL    LONG TERM GOALS: Target date: 12 weeks 07/28/23 Patient will be independent with advanced/ongoing HEP to improve outcomes and carryover.  Baseline: initiated at eval Goal status: INITIAL  2.  Patient will report improvement in neck pain to allow for 505 improvement in sleep to improve QOL.  Baseline: multiple sleep interruptions, and difficulty falling asleep Goal status: INITIAL  3.  Patient will demonstrate increased  cervical ROM for rotation to 60 degrees safety with driving.  Baseline: 45 Goal status: INITIAL  4.  Patient will report 15 on FOTO neck(patient outcome measure)  to demonstrate improved functional ability.  Baseline: 51 Goal status: INITIAL  5.  Patient will demonstrate improved strength B shoulder IR, horizontal  abd, and lower traps to 5/5  Baseline: 3+ to 4/5 Goal status: INITIAL   PLAN:  PT FREQUENCY: 1-2x/week  PT DURATION: 12 weeks  PLANNED INTERVENTIONS: Therapeutic exercises, Therapeutic activity, Neuromuscular re-education, Balance training, Gait training, Patient/Family education, Self Care, Joint mobilization, and Manual therapy  PLAN FOR NEXT SESSION: How was dry needling, how was therex.  Continue dry needling, assess and stretch upper cervical spine to improve rotation mobility, mobilize thoracic spine and ribs   Fanchon Papania L Troi Bechtold, PT 05/05/2023, 1:07 PM

## 2023-05-11 ENCOUNTER — Ambulatory Visit: Payer: BC Managed Care – PPO | Admitting: Physical Therapy

## 2023-05-12 ENCOUNTER — Other Ambulatory Visit: Payer: Self-pay | Admitting: Medical

## 2023-05-17 NOTE — Therapy (Signed)
OUTPATIENT PHYSICAL THERAPY CERVICAL TREATMENT   Patient Name: Ann Frederick MRN: [id number] DOB:15-Nov-1971, 52 y.o., female Today's Date: 05/18/2023  END OF SESSION:  PT End of Session - 05/18/23 1306     Visit Number 2    Date for PT Re-Evaluation 07/28/23    PT Start Time 1300    PT Stop Time 1343    PT Time Calculation (min) 43 min    Activity Tolerance Patient tolerated treatment well    Behavior During Therapy WFL for tasks assessed/performed              Past Medical History:  Diagnosis Date   Acne    Anemia    history of   Anxiety    Arthritis    Depression    GERD (gastroesophageal reflux disease)    worse while pregnant   Headache    Migraines   Heart murmur    ? heart murmur in past   History of blood transfusion 2013   Hypertension    Metabolic syndrome    Past Surgical History:  Procedure Laterality Date   ANTERIOR FUSION CERVICAL SPINE  2018   CESAREAN SECTION     x 2   CESAREAN SECTION  09/29/2012   Procedure: CESAREAN SECTION;  Surgeon: Turner Daniels, MD;  Location: [facility];  Service: Obstetrics;  Laterality: N/A;  Repeat edc 10/12/12/REQUEST;Chassity,Dee,Colleen   COLONOSCOPY     ESOPHAGOGASTRODUODENOSCOPY  normal    7/12   JOINT REPLACEMENT Bilateral    LAPAROSCOPIC VAGINAL HYSTERECTOMY WITH SALPINGECTOMY Bilateral 02/23/2016   Procedure: LAPAROSCOPIC ASSISTED VAGINAL HYSTERECTOMY WITH bilateral SALPINGECTOMY, right oophorectomy. laporotic repair of incidental cystotomy.;  Surgeon: Candice Camp, MD;  Location: [facility];  Service: Gynecology;  Laterality: Bilateral;   MOUTH SURGERY     Patient Active Problem List   Diagnosis Date Noted   Left hip pain 08/27/2019   Class 1 obesity due to excess calories without serious comorbidity with body mass index (BMI) of 34.0 to 34.9 in adult 11/28/2018   Heart palpitations 11/28/2018   OSA (obstructive sleep apnea) 09/14/2018   Neck pain 04/26/2018   Wellness examination 03/08/2016   S/P laparoscopic  assisted vaginal hysterectomy (LAVH) 02/23/2016   Pink eye 06/19/2015   Pain of right thumb 06/19/2015   Sinusitis, acute frontal 02/05/2015   Headache around the eyes 02/05/2015   Back pain 11/01/2014   Routine general medical examination at a health care facility 10/30/2013   Vesicular rash 09/07/2013   Sinusitis 02/02/2013   Viral pharyngitis 03/03/2012   Flank pain 08/13/2011   Inguinal adenopathy 08/13/2011   Dysphagia 04/20/2011   GERD 01/29/2011   FATIGUE 01/29/2011   SCIATICA, BILATERAL 06/18/2010   LEG PAIN, BILATERAL 06/06/2010   HIP PAIN, LEFT 04/01/2010   MASS, LOCALIZED, SUPERFICIAL 03/05/2009   ACNE VULGARIS 07/11/2007   MURMUR 07/11/2007    PCP: Carolin Guernsey PROVIDER: Donalee Citrin  REFERRING DIAG: cervicalgia  THERAPY DIAG:  Cervicalgia  Stiffness of right shoulder, not elsewhere classified  Abnormal posture  Rationale for Evaluation and Treatment: Rehabilitation  ONSET DATE: 03/16/23  SUBJECTIVE:  SUBJECTIVE STATEMENT: Pain was at distal scapula and now it's in the shoulder. DN helps   Eval: 6 weeks ago L sided stiff neck occiput to L upper traps, really painful, locked up eventually, then switched to R side, developed numbness dorsal hands, fingers.  Took naproxen which worked, flexiril works as well now sore and tingling R C 7/7 area, hand and forearm, no specific pattern noted . Sometimes interferes with sleep, wakes her up, difficulty going to sleep  Hand dominance: Right  PERTINENT HISTORY:  H/o cervical fusion C 3 to C7 2018, also hip replacements R 2023  PAIN:  Are you having pain? Yes: NPRS scale: 3/10 Pain location: R upper traps region Pain description: aching, sore, locks up Aggravating factors: no specific pattern Relieving  factors: meds somewhat  PRECAUTIONS: None  WEIGHT BEARING RESTRICTIONS: No  FALLS:  Has patient fallen in last 6 months? 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  OBJECTIVE:   DIAGNOSTIC FINDINGS:  Per neurologist's notes some spinal stenosis above and below surgical sites, not encroaching spinal cord  PATIENT SURVEYS:  FOTO 51 neck  COGNITION: Overall cognitive status: Within functional limits for tasks assessed  SENSATION: Reports tingling/ aching along radial nerve pathway, C7.  No sensory deficits noted  POSTURE: in standing R iliac crest elevated approx. 2 cm, R scapula and shoulder depressed. Mild concavity R lumbar region, mild R lat shift pelvis.  Increased thoracic kyphosis noted mid thoracic T 4 to T 8 region  PALPATION: Point tender R upper traps,R  supraspinatus, R infraspinatus,decreased PA mobility noted R post ribs as compared to L  CERVICAL ROM:   Active ROM A/PROM (deg) eval  Flexion 50  Extension 50  Right lateral flexion nt  Left lateral flexion nt  Right rotation 45  Left rotation 45   (Blank rows = not tested)  UPPER EXTREMITY ROM: All UE ROM wfl, discomfort/"soreness" noted by patient R side transitioning from R shoulder abduction to rest position and with IR behind back   UPPER EXTREMITY MMT:  MMT Right eval Left eval  Shoulder flexion 5 5  Shoulder extension 5 5  Shoulder abduction 5 5  Shoulder adduction 5 5  Shoulder extension 5 5  Shoulder internal rotation 4 4  Shoulder external rotation 5 5  Middle trapezius 4 4  Lower trapezius 3+ 4-  Elbow flexion 5 5  Elbow extension 5 5   (Blank rows = not tested)   TODAY'S TREATMENT:                                                                                                                               DATE:  05/18/23 UBE L2 x 6 min 61fwd/3bwd Foam roller: back, glutes, lat glutes,  HS, quads and gluteals 18 Seated ER RTB x 12 Seated horizontal ABD x 12 Standing diagonals x 10 ea RTB RTB IR/ER in door x 10 ea Manual Therapy: to decrease muscle spasm and pain and improve mobility Skilled palpation and monitoring of soft tissues during DN STM to R UT and IS Trigger Point Dry-Needling  Treatment instructions: Expect mild to moderate muscle soreness. S/S of pneumothorax if dry needled over a lung field, and to seek immediate medical attention should they occur. Patient verbalized understanding of these instructions and education. Patient Consent Given: Yes Education handout provided: No Muscles treated: R upper traps, R infraspinatus Electrical stimulation performed: No Parameters: N/A Treatment response/outcome: Twitch Response Elicited   Instructed in therex as described below, also patient education in evaluating posture at her work station, also elongating L step length and trying to maintain equal stride length B, noted slight shortening L stride length and decreased R hip ext movement terminal stance.  05/05/23  Manual:  prone for gentle PA gr 2 mobs B mid post ribs Trigger Point Dry-Needling  Treatment instructions: Expect mild to moderate muscle soreness. S/S of pneumothorax if dry needled over a lung field, and to seek immediate medical attention should they occur. Patient verbalized understanding of these instructions and education. Patient Consent Given: Yes Education handout provided: No Muscles treated: R upper traps, R infraspinatus Electrical stimulation performed: No Parameters: N/A Treatment response/outcome: Twitch Response Elicited   Instructed in therex as described below, also patient education in evaluating posture at her work station, also elongating L step length and trying to maintain equal stride length B, noted slight shortening L  stride length and decreased R hip ext movement terminal stance.  PATIENT EDUCATION:  Education details: POC, Goals Person educated: Patient Education method: Explanation, Demonstration, Tactile cues, Verbal cues, and Handouts Education comprehension: verbalized understanding, returned demonstration, and verbal cues required  HOME EXERCISE PROGRAM: Access Code: [email or web address] URL: [email or web address] Date: 05/18/2023 Prepared by: Raynelle Fanning  Exercises - Seated Shoulder Horizontal Abduction with Resistance - Palms Down  - 3 x daily - 7 x weekly - 1 sets - 10 reps - Standing Shoulder External Rotation with Resistance  - 3 x daily - 7 x weekly - 1 sets - 10 reps - Hip Flexor Stretch at Edge of Bed  - 3 x daily - 7 x weekly - 1 sets - 10 reps - Shoulder External Rotation with Anchored Resistance  - 1 x daily - 3-4 x weekly - 3 sets - 10 reps - Shoulder Internal Rotation with Resistance  - 1 x daily - 3-4 x weekly - 3 sets - 10 reps - Standing Shoulder Diagonal Horizontal Abduction 60/120 Degrees with Resistance  - 1 x daily - 3-4 x weekly - 3 sets - 10 reps - Thoracic Mobilization with Hands Behind Head on Foam Roll  - 1 x daily - 7 x weekly - 1 sets - 1 reps - Quadriceps Mobilization with Foam Roll  - 1 x daily - 3-4 x weekly - 1 sets - 10 reps - Gluteus Mobilization with Foam Roll  - 1 x daily - 3-4 x weekly - 1 sets - 10 reps - Figure 4 Gluteus Mobilization on Foam Roll  - 1 x daily - 3-4 x weekly - 1 sets - 10 reps  ASSESSMENT:  CLINICAL IMPRESSION: Derica demonstrates weakness in bil shoulders with IR and ER and fatigues fairly easily with diagonals and post shoulder exercises. Good response to DN again today. Pt educated on  use of foam roller for back and BLE as she plans to purchase one. She is independent and compliant with HEP which was progressed today. Lindasy continues to demonstrate potential for improvement and would benefit from continued skilled therapy to address impairments.    OBJECTIVE IMPAIRMENTS: decreased ROM, decreased strength, hypomobility, impaired flexibility, impaired UE functional use, and pain.   ACTIVITY LIMITATIONS: lifting, sleeping, and reach over head  PARTICIPATION LIMITATIONS: community activity and yard work  PERSONAL FACTORS: Age, Past/current experiences, Profession, Time since onset of injury/illness/exacerbation, and 1-2 comorbidities: B THA, previous c spine fusion  are also affecting patient's functional outcome.   REHAB POTENTIAL: Good  CLINICAL DECISION MAKING: Stable/uncomplicated  EVALUATION COMPLEXITY: Low   GOALS: Goals reviewed with patient? Yes GOALS: Goals reviewed with patient? Yes  SHORT TERM GOALS: Target date: 05/19/23 Patient will be independent with initial HEP.  Baseline: initiated at eval Goal status: MET    LONG TERM GOALS: Target date: 12 weeks 07/28/23 Patient will be independent with advanced/ongoing HEP to improve outcomes and carryover.  Baseline: initiated at eval Goal status: INITIAL  2.  Patient will report improvement in neck pain to allow for 505 improvement in sleep to improve QOL.  Baseline: multiple sleep interruptions, and difficulty falling asleep Goal status: INITIAL  3.  Patient will demonstrate increased  cervical ROM for rotation to 60 degrees safety with driving.  Baseline: 45 Goal status: INITIAL  4.  Patient will report 39 on FOTO neck(patient outcome measure)  to demonstrate improved functional ability.  Baseline: 51 Goal status: INITIAL  5.  Patient will demonstrate improved strength B shoulder IR, horizontal abd, and lower traps to 5/5 Baseline: 3+ to 4/5 Goal status: INITIAL   PLAN:  PT FREQUENCY: 1-2x/week  PT DURATION: 12 weeks  PLANNED INTERVENTIONS: Therapeutic exercises, Therapeutic activity, Neuromuscular re-education, Balance training, Gait training, Patient/Family education, Self Care, Joint mobilization, and Manual therapy  PLAN FOR NEXT SESSION: How was  dry needling, how was therex.  Continue dry needling, assess and stretch upper cervical spine to improve rotation mobility, mobilize thoracic spine and ribs   Glenmore Karl, PT 05/18/2023, 2:49 PM

## 2023-05-18 ENCOUNTER — Ambulatory Visit: Payer: BC Managed Care – PPO | Admitting: Physical Therapy

## 2023-05-18 ENCOUNTER — Encounter: Payer: Self-pay | Admitting: Physical Therapy

## 2023-05-18 DIAGNOSIS — M542 Cervicalgia: Secondary | ICD-10-CM | POA: Diagnosis not present

## 2023-05-18 DIAGNOSIS — M25611 Stiffness of right shoulder, not elsewhere classified: Secondary | ICD-10-CM | POA: Diagnosis not present

## 2023-05-18 DIAGNOSIS — R293 Abnormal posture: Secondary | ICD-10-CM | POA: Diagnosis not present

## 2023-05-31 ENCOUNTER — Encounter: Payer: Self-pay | Admitting: Physician Assistant

## 2023-05-31 ENCOUNTER — Telehealth: Payer: BC Managed Care – PPO | Admitting: Physician Assistant

## 2023-05-31 ENCOUNTER — Ambulatory Visit: Payer: BC Managed Care – PPO

## 2023-05-31 DIAGNOSIS — R6889 Other general symptoms and signs: Secondary | ICD-10-CM | POA: Diagnosis not present

## 2023-05-31 MED ORDER — FLUTICASONE PROPIONATE 50 MCG/ACT NA SUSP
2.0000 | Freq: Every day | NASAL | 0 refills | Status: DC
Start: 2023-05-31 — End: 2023-11-17

## 2023-05-31 MED ORDER — PROMETHAZINE-DM 6.25-15 MG/5ML PO SYRP: 5.0000 mL | ORAL_SOLUTION | Freq: Four times a day (QID) | ORAL | 0 refills | Status: DC | PRN

## 2023-05-31 MED ORDER — LIDOCAINE VISCOUS HCL 2 % MT SOLN
OROMUCOSAL | 0 refills | Status: DC
Start: 2023-05-31 — End: 2023-06-24

## 2023-05-31 MED ORDER — ONDANSETRON 4 MG PO TBDP
4.0000 mg | ORAL_TABLET | Freq: Three times a day (TID) | ORAL | 0 refills | Status: DC | PRN
Start: 2023-05-31 — End: 2023-06-24

## 2023-05-31 NOTE — Patient Instructions (Signed)
Ann Frederick, thank you for joining Margaretann Loveless, PA-C for today's virtual visit.  While this provider is not your primary care provider (PCP), if your PCP is located in our provider database this encounter information will be shared with them immediately following your visit.   A [facility] MyChart account gives you access to today's visit and all your visits, tests, and labs performed at [facility] " click here if you don't have a [facility] MyChart account or go to [email or web address]  Consent: (Patient) Ann Frederick provided verbal consent for this virtual visit at the beginning of the encounter.  Current Medications:  Current Outpatient Medications:    fluticasone (FLONASE) 50 MCG/ACT nasal spray, Place 2 sprays into both nostrils daily., Disp: 16 g, Rfl: 0   lidocaine (XYLOCAINE) 2 % solution, Swallow 5mL every 6 hours as needed for sore throat, Disp: 100 mL, Rfl: 0   ondansetron (ZOFRAN-ODT) 4 MG disintegrating tablet, Take 1 tablet (4 mg total) by mouth every 8 (eight) hours as needed., Disp: 20 tablet, Rfl: 0   promethazine-dextromethorphan (PROMETHAZINE-DM) 6.25-15 MG/5ML syrup, Take 5 mLs by mouth 4 (four) times daily as needed., Disp: 118 mL, Rfl: 0   ALPRAZolam (XANAX) 0.5 MG tablet, alprazolam 0.5 mg tablet  TAKE 1 TABLET BY MOUTH THREE TIMES A DAY AS NEEDED FOR ANXIETY, Disp: , Rfl:    AMBULATORY NON FORMULARY MEDICATION, Medication Name: nitroglycerin 0.125 % twice daily for 6-8 weeks, Disp: 1 Tube, Rfl: 3   aspirin 81 MG tablet, Take 81 mg by mouth every other day.  (Patient not taking: Reported on 11/26/2022), Disp: , Rfl:    buPROPion (WELLBUTRIN XL) 300 MG 24 hr tablet, Take 300 mg by mouth every morning., Disp: , Rfl:    busPIRone (BUSPAR) 15 MG tablet, TAKE 1 TABLET BY MOUTH TWICE A DAY, Disp: 180 tablet, Rfl: 2   ezetimibe (ZETIA) 10 MG tablet, TAKE 1 TABLET BY MOUTH EVERY DAY, Disp: 90 tablet, Rfl: 3   Insulin Pen Needle (B-D ULTRAFINE III  SHORT PEN) 31G X 8 MM MISC, USE AS DIRECTED TO INJECT SAXENDA, Disp: 100 each, Rfl: 3   metFORMIN (GLUCOPHAGE) 500 MG tablet, TAKE 1 TABLET BY MOUTH TWICE A DAY WITH FOOD, Disp: 180 tablet, Rfl: 0   methocarbamol (ROBAXIN-750) 750 MG tablet, Take 1 tablet (750 mg total) by mouth every 8 (eight) hours as needed for muscle spasms., Disp: 40 tablet, Rfl: 0   methylPREDNISolone (MEDROL) 4 MG tablet, Standard 6 day dose course., Disp: 21 tablet, Rfl: 0   naproxen (NAPROSYN) 500 MG tablet, Take 1 tablet (500 mg total) by mouth 2 (two) times daily with a meal., Disp: 30 tablet, Rfl: 0   SAXENDA 18 MG/3ML SOPN, INJECT 3 MG INTO THE SKIN DAILY., Disp: 3 mL, Rfl: 2   tiZANidine (ZANAFLEX) 2 MG tablet, Take 1 tablet (2 mg total) by mouth every 8 (eight) hours as needed for muscle spasms., Disp: 30 tablet, Rfl: 0   Turmeric 500 MG CAPS, Take 1 capsule by mouth 2 (two) times daily., Disp: , Rfl:    VITAMIN D PO, Take 1,000 Units by mouth 2 (two) times daily., Disp: , Rfl:    zolpidem (AMBIEN) 5 MG tablet, Take 5 mg by mouth at bedtime as needed for sleep., Disp: , Rfl:    Medications ordered in this encounter:  Meds ordered this encounter  Medications   promethazine-dextromethorphan (PROMETHAZINE-DM) 6.25-15 MG/5ML syrup    Sig: Take 5 mLs by mouth 4 (  four) times daily as needed.    Dispense:  118 mL    Refill:  0    Order Specific Question:   Supervising Provider    Answer:   LAMPTEY, PHILIP O [[id number]]   ondansetron (ZOFRAN-ODT) 4 MG disintegrating tablet    Sig: Take 1 tablet (4 mg total) by mouth every 8 (eight) hours as needed.    Dispense:  20 tablet    Refill:  0    Order Specific Question:   Supervising Provider    Answer:   Merrilee Jansky [[id number]]   fluticasone (FLONASE) 50 MCG/ACT nasal spray    Sig: Place 2 sprays into both nostrils daily.    Dispense:  16 g    Refill:  0    Order Specific Question:   Supervising Provider    Answer:   Merrilee Jansky [[id number]]   lidocaine  (XYLOCAINE) 2 % solution    Sig: Swallow 5mL every 6 hours as needed for sore throat    Dispense:  100 mL    Refill:  0    Order Specific Question:   Supervising Provider    Answer:   Merrilee Jansky [id number]     *If you need refills on other medications prior to your next appointment, please contact your pharmacy*  Follow-Up: Call back or seek an in-person evaluation if the symptoms worsen or if the condition fails to improve as anticipated.  [facility] [phone]  Other Instructions Viral Respiratory Infection A respiratory infection is an illness that affects part of the respiratory system, such as the lungs, nose, or throat. A respiratory infection that is caused by a virus is called a viral respiratory infection. Common types of viral respiratory infections include: A cold. The flu (influenza). A respiratory syncytial virus (RSV) infection. What are the causes? This condition is caused by a virus. The virus may spread through contact with droplets or direct contact with infected people or their mucus or secretions. The virus may spread from person to person (is contagious). What are the signs or symptoms? Symptoms of this condition include: A stuffy or runny nose. A sore throat or cough. Shortness of breath or difficulty breathing. Yellow or green mucus (sputum). Other symptoms may include: A fever. Sweating or chills. Fatigue. Achy muscles. A headache. How is this diagnosed? This condition may be diagnosed based on: Your symptoms. A physical exam. Testing of secretions from the nose or throat. Chest X-ray. How is this treated? This condition may be treated with medicines, such as: Antiviral medicine. This may shorten the length of time a person has symptoms. Expectorants. These make it easier to cough up mucus. Decongestant nasal sprays. Acetaminophen or NSAIDs, such as ibuprofen, to relieve fever and pain. Antibiotic medicines are not  prescribed for viral infections.This is because antibiotics are designed to kill bacteria. They do not kill viruses. Follow these instructions at home: Managing pain and congestion Take over-the-counter and prescription medicines only as told by your health care provider. If you have a sore throat, gargle with a mixture of salt and water 3-4 times a day or as needed. To make salt water, completely dissolve -1 tsp (3-6 g) of salt in 1 cup (237 mL) of warm water. Use nose drops made from salt water to ease congestion and soften raw skin around your nose. Take 2 tsp (10 mL) of honey at bedtime to lessen coughing at night. Do not give honey to children who are younger than 1  year. Drink enough fluid to keep your urine pale yellow. This helps prevent dehydration and helps loosen up mucus. General instructions  Rest as much as possible. Do not drink alcohol. Do not use any products that contain nicotine or tobacco. These products include cigarettes, chewing tobacco, and vaping devices, such as e-cigarettes. If you need help quitting, ask your health care provider. Keep all follow-up visits. This is important. How is this prevented?     Get an annual flu shot. You may get the flu shot in late summer, fall, or winter. Ask your health care provider when you should get your flu shot. Avoid spreading your infection to other people. If you are sick: Wash your hands with soap and water often, especially after you cough or sneeze. Wash for at least 20 seconds. If soap and water are not available, use alcohol-based hand sanitizer. Cover your mouth when you cough. Cover your nose and mouth when you sneeze. Do not share cups or eating utensils. Clean commonly used objects often. Clean commonly touched surfaces. Stay home from work or school as told by your health care provider. Avoid contact with people who are sick during cold and flu season. This is generally fall and winter. Contact a health care  provider if: Your symptoms last for 10 days or longer. Your symptoms get worse over time. You have severe sinus pain in your face or forehead. The glands in your jaw or neck become very swollen. You have shortness of breath. Get help right away if you: Feel pain or pressure in your chest. Have trouble breathing. Faint or feel like you will faint. Have severe and persistent vomiting. Feel confused or disoriented. These symptoms may represent a serious problem that is an emergency. Do not wait to see if the symptoms will go away. Get medical help right away. Call your local emergency services (911 in the [place] Do not drive yourself to the hospital. Summary A respiratory infection is an illness that affects part of the respiratory system, such as the lungs, nose, or throat. A respiratory infection that is caused by a virus is called a viral respiratory infection. Common types of viral respiratory infections include a cold, influenza, and respiratory syncytial virus (RSV) infection. Symptoms of this condition include a stuffy or runny nose, cough, fatigue, achy muscles, sore throat, and fevers or chills. Antibiotic medicines are not prescribed for viral infections. This is because antibiotics are designed to kill bacteria. They are not effective against viruses. This information is not intended to replace advice given to you by your health care provider. Make sure you discuss any questions you have with your health care provider. Document Revised: 03/19/2021 Document Reviewed: 03/19/2021 Elsevier Patient Education  2024 [employer]    If you have been instructed to have an in-person evaluation today at a local Urgent Care facility, please use the link below. It will take you to a list of all of our available [facility], including address, phone number and hours of operation. Please do not delay care.  [facility]  If you or a family member do not have a primary  care provider, use the link below to schedule a visit and establish care. When you choose a [facility] primary care physician or advanced practice provider, you gain a long-term partner in health. Find a Primary Care Provider  Learn more about [facility]'s in-office and virtual care options:  - Get Care Now

## 2023-05-31 NOTE — Progress Notes (Signed)
Virtual Visit Consent   NYSA BROSZ, you are scheduled for a virtual visit with a [facility] provider today. Just as with appointments in the office, your consent must be obtained to participate. Your consent will be active for this visit and any virtual visit you may have with one of our providers in the next 365 days. If you have a MyChart account, a copy of this consent can be sent to you electronically.  As this is a virtual visit, video technology does not allow for your provider to perform a traditional examination. This may limit your provider's ability to fully assess your condition. If your provider identifies any concerns that need to be evaluated in person or the need to arrange testing (such as labs, EKG, etc.), we will make arrangements to do so. Although advances in technology are sophisticated, we cannot ensure that it will always work on either your end or our end. If the connection with a video visit is poor, the visit may have to be switched to a telephone visit. With either a video or telephone visit, we are not always able to ensure that we have a secure connection.  By engaging in this virtual visit, you consent to the provision of healthcare and authorize for your insurance to be billed (if applicable) for the services provided during this visit. Depending on your insurance coverage, you may receive a charge related to this service.  I need to obtain your verbal consent now. Are you willing to proceed with your visit today? Ann Frederick has provided verbal consent on 05/31/2023 for a virtual visit (video or telephone). Margaretann Loveless, PA-C  Date: 05/31/2023 8:11 AM  Virtual Visit via Video Note   I, Margaretann Loveless, connected with  Ann Frederick  ([id number], [birth year]) on 05/31/23 at  8:00 AM EDT by a video-enabled telemedicine application and verified that I am speaking with the correct person using two identifiers.  Location: Patient: Virtual Visit Location  Patient: Home Provider: Virtual Visit Location Provider: Home Office   I discussed the limitations of evaluation and management by telemedicine and the availability of in person appointments. The patient expressed understanding and agreed to proceed.    History of Present Illness: Ann Frederick is a 52 y.o. who identifies as a female who was assigned female at birth, and is being seen today for URI symptoms.  HPI: URI  This is a new problem. The current episode started in the past 7 days. The problem has been gradually worsening. There has been no fever. Associated symptoms include abdominal pain (stomach cramps), congestion, coughing, headaches, nausea, rhinorrhea, sinus pain, a sore throat and vomiting (Sunday morning before symptoms worsened). Pertinent negatives include no chest pain, diarrhea, ear pain or plugged ear sensation. Associated symptoms comments: Myalgias, chills. Treatments tried: dimetapp, lemon tea. The treatment provided no relief.    Did fly from [place] home on Sunday. Symptoms started Sunday night while flying home.   Problems:  Patient Active Problem List   Diagnosis Date Noted   Left hip pain 08/27/2019   Class 1 obesity due to excess calories without serious comorbidity with body mass index (BMI) of 34.0 to 34.9 in adult 11/28/2018   Heart palpitations 11/28/2018   OSA (obstructive sleep apnea) 09/14/2018   Neck pain 04/26/2018   Wellness examination 03/08/2016   S/P laparoscopic assisted vaginal hysterectomy (LAVH) 02/23/2016   Pink eye 06/19/2015   Pain of right thumb 06/19/2015   Sinusitis, acute frontal 02/05/2015  Headache around the eyes 02/05/2015   Back pain 11/01/2014   Routine general medical examination at a health care facility 10/30/2013   Vesicular rash 09/07/2013   Sinusitis 02/02/2013   Viral pharyngitis 03/03/2012   Flank pain 08/13/2011   Inguinal adenopathy 08/13/2011   Dysphagia 04/20/2011   GERD 01/29/2011   FATIGUE 01/29/2011    SCIATICA, BILATERAL 06/18/2010   LEG PAIN, BILATERAL 06/06/2010   HIP PAIN, LEFT 04/01/2010   MASS, LOCALIZED, SUPERFICIAL 03/05/2009   ACNE VULGARIS 07/11/2007   MURMUR 07/11/2007    Allergies:  Allergies  Allergen Reactions   Azithromycin Nausea And Vomiting    diarrhea   Medications:  Current Outpatient Medications:    fluticasone (FLONASE) 50 MCG/ACT nasal spray, Place 2 sprays into both nostrils daily., Disp: 16 g, Rfl: 0   lidocaine (XYLOCAINE) 2 % solution, Swallow 5mL every 6 hours as needed for sore throat, Disp: 100 mL, Rfl: 0   ondansetron (ZOFRAN-ODT) 4 MG disintegrating tablet, Take 1 tablet (4 mg total) by mouth every 8 (eight) hours as needed., Disp: 20 tablet, Rfl: 0   promethazine-dextromethorphan (PROMETHAZINE-DM) 6.25-15 MG/5ML syrup, Take 5 mLs by mouth 4 (four) times daily as needed., Disp: 118 mL, Rfl: 0   ALPRAZolam (XANAX) 0.5 MG tablet, alprazolam 0.5 mg tablet  TAKE 1 TABLET BY MOUTH THREE TIMES A DAY AS NEEDED FOR ANXIETY, Disp: , Rfl:    AMBULATORY NON FORMULARY MEDICATION, Medication Name: nitroglycerin 0.125 % twice daily for 6-8 weeks, Disp: 1 Tube, Rfl: 3   aspirin 81 MG tablet, Take 81 mg by mouth every other day.  (Patient not taking: Reported on 11/26/2022), Disp: , Rfl:    buPROPion (WELLBUTRIN XL) 300 MG 24 hr tablet, Take 300 mg by mouth every morning., Disp: , Rfl:    busPIRone (BUSPAR) 15 MG tablet, TAKE 1 TABLET BY MOUTH TWICE A DAY, Disp: 180 tablet, Rfl: 2   ezetimibe (ZETIA) 10 MG tablet, TAKE 1 TABLET BY MOUTH EVERY DAY, Disp: 90 tablet, Rfl: 3   Insulin Pen Needle (B-D ULTRAFINE III SHORT PEN) 31G X 8 MM MISC, USE AS DIRECTED TO INJECT SAXENDA, Disp: 100 each, Rfl: 3   metFORMIN (GLUCOPHAGE) 500 MG tablet, TAKE 1 TABLET BY MOUTH TWICE A DAY WITH FOOD, Disp: 180 tablet, Rfl: 0   methocarbamol (ROBAXIN-750) 750 MG tablet, Take 1 tablet (750 mg total) by mouth every 8 (eight) hours as needed for muscle spasms., Disp: 40 tablet, Rfl: 0    methylPREDNISolone (MEDROL) 4 MG tablet, Standard 6 day dose course., Disp: 21 tablet, Rfl: 0   naproxen (NAPROSYN) 500 MG tablet, Take 1 tablet (500 mg total) by mouth 2 (two) times daily with a meal., Disp: 30 tablet, Rfl: 0   SAXENDA 18 MG/3ML SOPN, INJECT 3 MG INTO THE SKIN DAILY., Disp: 3 mL, Rfl: 2   tiZANidine (ZANAFLEX) 2 MG tablet, Take 1 tablet (2 mg total) by mouth every 8 (eight) hours as needed for muscle spasms., Disp: 30 tablet, Rfl: 0   Turmeric 500 MG CAPS, Take 1 capsule by mouth 2 (two) times daily., Disp: , Rfl:    VITAMIN D PO, Take 1,000 Units by mouth 2 (two) times daily., Disp: , Rfl:    zolpidem (AMBIEN) 5 MG tablet, Take 5 mg by mouth at bedtime as needed for sleep., Disp: , Rfl:   Observations/Objective: Patient is well-developed, well-nourished in no acute distress.  Resting comfortably at home.  Head is normocephalic, atraumatic.  No labored breathing.  Speech is  clear and coherent with logical content.  Patient is alert and oriented at baseline.    Assessment and Plan: 1. Flu-like symptoms - promethazine-dextromethorphan (PROMETHAZINE-DM) 6.25-15 MG/5ML syrup; Take 5 mLs by mouth 4 (four) times daily as needed.  Dispense: 118 mL; Refill: 0 - ondansetron (ZOFRAN-ODT) 4 MG disintegrating tablet; Take 1 tablet (4 mg total) by mouth every 8 (eight) hours as needed.  Dispense: 20 tablet; Refill: 0 - fluticasone (FLONASE) 50 MCG/ACT nasal spray; Place 2 sprays into both nostrils daily.  Dispense: 16 g; Refill: 0 - lidocaine (XYLOCAINE) 2 % solution; Swallow 5mL every 6 hours as needed for sore throat  Dispense: 100 mL; Refill: 0  - Suspicious for Covid 19 - Symptomatic medications of choice over the counter as needed - Prescribed Promethazine DM, Zofran, Viscous Lidocaine, and Flonase for symptoms - Push fluids - Rest - Seek further evaluation if symptoms change or worsen   Follow Up Instructions: I discussed the assessment and treatment plan with the patient.  The patient was provided an opportunity to ask questions and all were answered. The patient agreed with the plan and demonstrated an understanding of the instructions.  A copy of instructions were sent to the patient via MyChart unless otherwise noted below.    The patient was advised to call back or seek an in-person evaluation if the symptoms worsen or if the condition fails to improve as anticipated.  Time:  I spent 10 minutes with the patient via telehealth technology discussing the above problems/concerns.    Margaretann Loveless, PA-C

## 2023-06-07 ENCOUNTER — Other Ambulatory Visit: Payer: Self-pay

## 2023-06-07 ENCOUNTER — Ambulatory Visit: Payer: BC Managed Care – PPO | Attending: Neurosurgery

## 2023-06-07 DIAGNOSIS — R293 Abnormal posture: Secondary | ICD-10-CM | POA: Insufficient documentation

## 2023-06-07 DIAGNOSIS — M542 Cervicalgia: Secondary | ICD-10-CM | POA: Diagnosis not present

## 2023-06-07 DIAGNOSIS — M25611 Stiffness of right shoulder, not elsewhere classified: Secondary | ICD-10-CM | POA: Insufficient documentation

## 2023-06-07 NOTE — Therapy (Signed)
OUTPATIENT PHYSICAL THERAPY CERVICAL TREATMENT   Patient Name: Ann Frederick MRN: [id number] DOB:August 05, 1971, 52 y.o., female Today's Date: 06/07/2023  END OF SESSION:  PT End of Session - 06/07/23 0934     Visit Number 3    Date for PT Re-Evaluation 07/28/23    PT Start Time 0935    PT Stop Time 1015    PT Time Calculation (min) 40 min    Activity Tolerance Patient tolerated treatment well    Behavior During Therapy WFL for tasks assessed/performed              Past Medical History:  Diagnosis Date   Acne    Anemia    history of   Anxiety    Arthritis    Depression    GERD (gastroesophageal reflux disease)    worse while pregnant   Headache    Migraines   Heart murmur    ? heart murmur in past   History of blood transfusion 2013   Hypertension    Metabolic syndrome    Past Surgical History:  Procedure Laterality Date   ANTERIOR FUSION CERVICAL SPINE  2018   CESAREAN SECTION     x 2   CESAREAN SECTION  09/29/2012   Procedure: CESAREAN SECTION;  Surgeon: Turner Daniels, MD;  Location: [facility];  Service: Obstetrics;  Laterality: N/A;  Repeat edc 10/12/12/REQUEST;Chassity,Dee,Colleen   COLONOSCOPY     ESOPHAGOGASTRODUODENOSCOPY  normal    7/12   JOINT REPLACEMENT Bilateral    LAPAROSCOPIC VAGINAL HYSTERECTOMY WITH SALPINGECTOMY Bilateral 02/23/2016   Procedure: LAPAROSCOPIC ASSISTED VAGINAL HYSTERECTOMY WITH bilateral SALPINGECTOMY, right oophorectomy. laporotic repair of incidental cystotomy.;  Surgeon: Candice Camp, MD;  Location: [facility];  Service: Gynecology;  Laterality: Bilateral;   MOUTH SURGERY     Patient Active Problem List   Diagnosis Date Noted   Left hip pain 08/27/2019   Class 1 obesity due to excess calories without serious comorbidity with body mass index (BMI) of 34.0 to 34.9 in adult 11/28/2018   Heart palpitations 11/28/2018   OSA (obstructive sleep apnea) 09/14/2018   Neck pain 04/26/2018   Wellness examination 03/08/2016   S/P laparoscopic  assisted vaginal hysterectomy (LAVH) 02/23/2016   Pink eye 06/19/2015   Pain of right thumb 06/19/2015   Sinusitis, acute frontal 02/05/2015   Headache around the eyes 02/05/2015   Back pain 11/01/2014   Routine general medical examination at a health care facility 10/30/2013   Vesicular rash 09/07/2013   Sinusitis 02/02/2013   Viral pharyngitis 03/03/2012   Flank pain 08/13/2011   Inguinal adenopathy 08/13/2011   Dysphagia 04/20/2011   GERD 01/29/2011   FATIGUE 01/29/2011   SCIATICA, BILATERAL 06/18/2010   LEG PAIN, BILATERAL 06/06/2010   HIP PAIN, LEFT 04/01/2010   MASS, LOCALIZED, SUPERFICIAL 03/05/2009   ACNE VULGARIS 07/11/2007   MURMUR 07/11/2007    PCP: Carolin Guernsey PROVIDER: Donalee Citrin  REFERRING DIAG: cervicalgia  THERAPY DIAG:  Cervicalgia  Stiffness of right shoulder, not elsewhere classified  Abnormal posture  Rationale for Evaluation and Treatment: Rehabilitation  ONSET DATE: 03/16/23  SUBJECTIVE:  SUBJECTIVE STATEMENT: Stiff R upper traps, I think some of it is when I cook, I can spend a few hours cooking.  My R hip is still kind of stiff. I travelled for my college reunion to [place] and got sick so havent been able to come in here or be very diligent with the therex.  Eval: 6 weeks ago L sided stiff neck occiput to L upper traps, really painful, locked up eventually, then switched to R side, developed numbness dorsal hands, fingers.  Took naproxen which worked, flexiril works as well now sore and tingling R C 7/7 area, hand and forearm, no specific pattern noted . Sometimes interferes with sleep, wakes her up, difficulty going to sleep  Hand dominance: Right  PERTINENT HISTORY:  H/o cervical fusion C 3 to C7 2018, also hip replacements R  2023  PAIN:  Are you having pain? Yes: NPRS scale: 3/10 Pain location: R upper traps region Pain description: aching, sore, locks up Aggravating factors: no specific pattern Relieving factors: meds somewhat  PRECAUTIONS: None  WEIGHT BEARING RESTRICTIONS: No  FALLS:  Has patient fallen in last 6 months? 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  OBJECTIVE:   DIAGNOSTIC FINDINGS:  Per neurologist's notes some spinal stenosis above and below surgical sites, not encroaching spinal cord  PATIENT SURVEYS:  FOTO 51 neck  COGNITION: Overall cognitive status: Within functional limits for tasks assessed  SENSATION: Reports tingling/ aching along radial nerve pathway, C7.  No sensory deficits noted  POSTURE: in standing R iliac crest elevated approx. 2 cm, R scapula and shoulder depressed. Mild concavity R lumbar region, mild R lat shift pelvis.  Increased thoracic kyphosis noted mid thoracic T 4 to T 8 region  PALPATION: Point tender R upper traps,R  supraspinatus, R infraspinatus,decreased PA mobility noted R post ribs as compared to L  CERVICAL ROM:   Active ROM A/PROM (deg) eval  Flexion 50  Extension 50  Right lateral flexion nt  Left lateral flexion nt  Right rotation 45  Left rotation 45   (Blank rows = not tested)  UPPER EXTREMITY ROM: All UE ROM wfl, discomfort/"soreness" noted by patient R side transitioning from R shoulder abduction to rest position and with IR behind back   UPPER EXTREMITY MMT:  MMT Right eval Left eval  Shoulder flexion 5 5  Shoulder extension 5 5  Shoulder abduction 5 5  Shoulder adduction 5 5  Shoulder extension 5 5  Shoulder internal rotation 4 4  Shoulder external rotation 5 5  Middle trapezius 4 4  Lower trapezius 3+  4-  Elbow flexion 5 5  Elbow extension 5 5   (Blank rows = not tested)   TODAY'S TREATMENT:  DATE:  06/07/23:Manual:  Prone for PA stabilization R gr trochanter and manual stretching of R ant hip jt, quads, hip flexors, 45 sec holds, 3 reps Trigger Point Dry-Needling  Treatment instructions: Expect mild to moderate muscle soreness. S/S of pneumothorax if dry needled over a lung field, and to seek immediate medical attention should they occur. Patient verbalized understanding of these instructions and education. Patient Consent Given: Yes Education handout provided: No Muscles treated: R upper traps, R infraspinatus, 2 pts each Electrical stimulation performed: No Parameters: N/A Treatment response/outcome: Twitch Response Elicited and Palpable Increase in Muscle Length Therapeutic exercise: The patient was instructed/ reviewed the following ex: 6"Foam roller for pecs stretch and B shoulder flexion, static and dynamic stretching Prone B quads myofascial release with foam roller Red t band B shoulder ER Red t band B shoulder horiz abd Red t band diagonals, Red t band shoulder IR Also inst in seated quads and IT band self myofascial release with massage stick and foam roller.   05/18/23 UBE L2 x 6 min 77fwd/3bwd Foam roller: back, glutes, lat glutes, HS, quads and gluteals 18 Seated ER RTB x 12 Seated horizontal ABD x 12 Standing diagonals x 10 ea RTB RTB IR/ER in door x 10 ea Manual Therapy: to decrease muscle spasm and pain and improve mobility Skilled palpation and monitoring of soft tissues during DN STM to R UT and IS Trigger Point Dry-Needling  Treatment instructions: Expect mild to moderate muscle soreness. S/S of pneumothorax if dry needled over a lung field, and to seek immediate medical attention should they occur. Patient verbalized understanding of  these instructions and education. Patient Consent Given: Yes Education handout provided: No Muscles treated: R upper traps, R infraspinatus Electrical stimulation performed: No Parameters: N/A Treatment response/outcome: Twitch Response Elicited   Instructed in therex as described below, also patient education in evaluating posture at her work station, also elongating L step length and trying to maintain equal stride length B, noted slight shortening L stride length and decreased R hip ext movement terminal stance.  05/05/23  Manual:  prone for gentle PA gr 2 mobs B mid post ribs Trigger Point Dry-Needling  Treatment instructions: Expect mild to moderate muscle soreness. S/S of pneumothorax if dry needled over a lung field, and to seek immediate medical attention should they occur. Patient verbalized understanding of these instructions and education. Patient Consent Given: Yes Education handout provided: No Muscles treated: R upper traps, R infraspinatus Electrical stimulation performed: No Parameters: N/A Treatment response/outcome: Twitch Response Elicited   Instructed in therex as described below, also patient education in evaluating posture at her work station, also elongating L step length and trying to maintain equal stride length B, noted slight shortening L stride length and decreased R hip ext movement terminal stance.  PATIENT EDUCATION:  Education details: POC, Goals Person educated: Patient Education method: Explanation, Demonstration, Tactile cues, Verbal cues, and Handouts Education comprehension: verbalized understanding, returned demonstration, and verbal cues required  HOME EXERCISE PROGRAM: Access Code: [email or web address] URL: [email or web address] Date: 05/18/2023 Prepared by: Raynelle Fanning  Exercises - Seated Shoulder Horizontal Abduction with Resistance - Palms Down  - 3 x daily - 7 x weekly - 1 sets - 10 reps - Standing Shoulder External Rotation with Resistance   - 3 x daily - 7 x weekly - 1 sets - 10 reps - Hip Flexor Stretch at Edge of Bed  - 3 x daily - 7 x weekly - 1 sets - 10 reps - Shoulder External Rotation with  Anchored Resistance  - 1 x daily - 3-4 x weekly - 3 sets - 10 reps - Shoulder Internal Rotation with Resistance  - 1 x daily - 3-4 x weekly - 3 sets - 10 reps - Standing Shoulder Diagonal Horizontal Abduction 60/120 Degrees with Resistance  - 1 x daily - 3-4 x weekly - 3 sets - 10 reps - Thoracic Mobilization with Hands Behind Head on Foam Roll  - 1 x daily - 7 x weekly - 1 sets - 1 reps - Quadriceps Mobilization with Foam Roll  - 1 x daily - 3-4 x weekly - 1 sets - 10 reps - Gluteus Mobilization with Foam Roll  - 1 x daily - 3-4 x weekly - 1 sets - 10 reps - Figure 4 Gluteus Mobilization on Foam Roll  - 1 x daily - 3-4 x weekly - 1 sets - 10 reps  ASSESSMENT:  CLINICAL IMPRESSION: Charliegh overall has improved Sx periscapular region.  Still weak B shoulders ER .  Limited flexibility R hip as compared to L in all planes.   Good response to DN again today. Aleysha continues to demonstrate potential for improvement and would benefit from continued skilled therapy to address impairments.   OBJECTIVE IMPAIRMENTS: decreased ROM, decreased strength, hypomobility, impaired flexibility, impaired UE functional use, and pain.   ACTIVITY LIMITATIONS: lifting, sleeping, and reach over head  PARTICIPATION LIMITATIONS: community activity and yard work  PERSONAL FACTORS: Age, Past/current experiences, Profession, Time since onset of injury/illness/exacerbation, and 1-2 comorbidities: B THA, previous c spine fusion  are also affecting patient's functional outcome.   REHAB POTENTIAL: Good  CLINICAL DECISION MAKING: Stable/uncomplicated  EVALUATION COMPLEXITY: Low   GOALS: Goals reviewed with patient? Yes GOALS: Goals reviewed with patient? Yes  SHORT TERM GOALS: Target date: 05/19/23 Patient will be independent with initial HEP.  Baseline:  initiated at eval Goal status: MET    LONG TERM GOALS: Target date: 12 weeks 07/28/23 Patient will be independent with advanced/ongoing HEP to improve outcomes and carryover.  Baseline: initiated at eval Goal status: INITIAL  2.  Patient will report improvement in neck pain to allow for 505 improvement in sleep to improve QOL.  Baseline: multiple sleep interruptions, and difficulty falling asleep Goal status: INITIAL  3.  Patient will demonstrate increased  cervical ROM for rotation to 60 degrees safety with driving.  Baseline: 45 Goal status: INITIAL  4.  Patient will report 61 on FOTO neck(patient outcome measure)  to demonstrate improved functional ability.  Baseline: 51 Goal status: INITIAL  5.  Patient will demonstrate improved strength B shoulder IR, horizontal abd, and lower traps to 5/5 Baseline: 3+ to 4/5 Goal status: INITIAL   PLAN:  PT FREQUENCY: 1-2x/week  PT DURATION: 12 weeks  PLANNED INTERVENTIONS: Therapeutic exercises, Therapeutic activity, Neuromuscular re-education, Balance training, Gait training, Patient/Family education, Self Care, Joint mobilization, and Manual therapy  PLAN FOR NEXT SESSION: How was dry needling, how was therex.  Continue dry needling, assess and stretch upper cervical spine to improve rotation mobility, mobilize thoracic spine and ribs   Jenasia Dolinar L Refugio Vandevoorde, PT 06/07/2023, 12:14 PM

## 2023-06-09 ENCOUNTER — Ambulatory Visit: Payer: BC Managed Care – PPO

## 2023-06-16 ENCOUNTER — Other Ambulatory Visit: Payer: Self-pay | Admitting: Medical

## 2023-06-17 ENCOUNTER — Telehealth: Payer: BC Managed Care – PPO | Admitting: Physician Assistant

## 2023-06-17 DIAGNOSIS — J019 Acute sinusitis, unspecified: Secondary | ICD-10-CM

## 2023-06-17 DIAGNOSIS — B9689 Other specified bacterial agents as the cause of diseases classified elsewhere: Secondary | ICD-10-CM

## 2023-06-17 MED ORDER — AMOXICILLIN-POT CLAVULANATE 875-125 MG PO TABS
1.0000 | ORAL_TABLET | Freq: Two times a day (BID) | ORAL | 0 refills | Status: DC
Start: 2023-06-17 — End: 2023-06-24

## 2023-06-17 NOTE — Progress Notes (Signed)
Virtual Visit Consent   Ann Frederick, you are scheduled for a virtual visit with a [facility] provider today. Just as with appointments in the office, your consent must be obtained to participate. Your consent will be active for this visit and any virtual visit you may have with one of our providers in the next 365 days. If you have a MyChart account, a copy of this consent can be sent to you electronically.  As this is a virtual visit, video technology does not allow for your provider to perform a traditional examination. This may limit your provider's ability to fully assess your condition. If your provider identifies any concerns that need to be evaluated in person or the need to arrange testing (such as labs, EKG, etc.), we will make arrangements to do so. Although advances in technology are sophisticated, we cannot ensure that it will always work on either your end or our end. If the connection with a video visit is poor, the visit may have to be switched to a telephone visit. With either a video or telephone visit, we are not always able to ensure that we have a secure connection.  By engaging in this virtual visit, you consent to the provision of healthcare and authorize for your insurance to be billed (if applicable) for the services provided during this visit. Depending on your insurance coverage, you may receive a charge related to this service.  I need to obtain your verbal consent now. Are you willing to proceed with your visit today? [name] has provided verbal consent on 06/17/2023 for a virtual visit (video or telephone). Margaretann Loveless, PA-C  Date: 06/17/2023 12:09 PM  Virtual Visit via Video Note   I, Margaretann Loveless, connected with  Ann Frederick  ([id number], 1971-11-21) on 06/17/23 at 12:00 PM EDT by a video-enabled telemedicine application and verified that I am speaking with the correct person using two identifiers.  Location: Patient: Virtual Visit Location  Patient: Home Provider: Virtual Visit Location Provider: Home Office   I discussed the limitations of evaluation and management by telemedicine and the availability of in person appointments. The patient expressed understanding and agreed to proceed.    History of Present Illness: Ann Frederick is a 52 y.o. who identifies as a female who was assigned female at birth, and is being seen today for continued URI symptoms. Seen virtually on 05/31/23 and diagnosed with Viral URI/Flu-like symptoms.  HPI: URI  This is a new problem. The current episode started 1 to 4 weeks ago (3 weeks). The problem has been unchanged. There has been no fever. Associated symptoms include congestion, coughing, headaches, nausea, rhinorrhea (post nasal drainage), sinus pain and a sore throat (raw from post nasal drainage). Pertinent negatives include no diarrhea, ear pain, plugged ear sensation, vomiting or wheezing. Associated symptoms comments: Intermittent myalgias, increased fatigue by end of day. She has tried antihistamine (promethazine dm, flonase) for the symptoms. The treatment provided no relief.   Covid 19 test was negative   Problems:  Patient Active Problem List   Diagnosis Date Noted   Left hip pain 08/27/2019   Class 1 obesity due to excess calories without serious comorbidity with body mass index (BMI) of 34.0 to 34.9 in adult 11/28/2018   Heart palpitations 11/28/2018   OSA (obstructive sleep apnea) 09/14/2018   Neck pain 04/26/2018   Wellness examination 03/08/2016   S/P laparoscopic assisted vaginal hysterectomy (LAVH) 02/23/2016   Pink eye 06/19/2015   Pain  of right thumb 06/19/2015   Sinusitis, acute frontal 02/05/2015   Headache around the eyes 02/05/2015   Back pain 11/01/2014   Routine general medical examination at a health care facility 10/30/2013   Vesicular rash 09/07/2013   Sinusitis 02/02/2013   Viral pharyngitis 03/03/2012   Flank pain 08/13/2011   Inguinal adenopathy 08/13/2011    Dysphagia 04/20/2011   GERD 01/29/2011   FATIGUE 01/29/2011   SCIATICA, BILATERAL 06/18/2010   LEG PAIN, BILATERAL 06/06/2010   HIP PAIN, LEFT 04/01/2010   MASS, LOCALIZED, SUPERFICIAL 03/05/2009   ACNE VULGARIS 07/11/2007   MURMUR 07/11/2007    Allergies:  Allergies  Allergen Reactions   Azithromycin Nausea And Vomiting    diarrhea   Medications:  Current Outpatient Medications:    amoxicillin-clavulanate (AUGMENTIN) 875-125 MG tablet, Take 1 tablet by mouth 2 (two) times daily., Disp: 20 tablet, Rfl: 0   ALPRAZolam (XANAX) 0.5 MG tablet, alprazolam 0.5 mg tablet  TAKE 1 TABLET BY MOUTH THREE TIMES A DAY AS NEEDED FOR ANXIETY, Disp: , Rfl:    AMBULATORY NON FORMULARY MEDICATION, Medication Name: nitroglycerin 0.125 % twice daily for 6-8 weeks, Disp: 1 Tube, Rfl: 3   aspirin 81 MG tablet, Take 81 mg by mouth every other day.  (Patient not taking: Reported on 11/26/2022), Disp: , Rfl:    buPROPion (WELLBUTRIN XL) 300 MG 24 hr tablet, Take 300 mg by mouth every morning., Disp: , Rfl:    busPIRone (BUSPAR) 15 MG tablet, TAKE 1 TABLET BY MOUTH TWICE A DAY, Disp: 180 tablet, Rfl: 2   ezetimibe (ZETIA) 10 MG tablet, TAKE 1 TABLET BY MOUTH EVERY DAY, Disp: 90 tablet, Rfl: 3   fluticasone (FLONASE) 50 MCG/ACT nasal spray, Place 2 sprays into both nostrils daily., Disp: 16 g, Rfl: 0   Insulin Pen Needle (B-D ULTRAFINE III SHORT PEN) 31G X 8 MM MISC, USE AS DIRECTED TO INJECT SAXENDA, Disp: 100 each, Rfl: 3   lidocaine (XYLOCAINE) 2 % solution, Swallow 5mL every 6 hours as needed for sore throat, Disp: 100 mL, Rfl: 0   metFORMIN (GLUCOPHAGE) 500 MG tablet, TAKE 1 TABLET BY MOUTH TWICE A DAY WITH FOOD, Disp: 180 tablet, Rfl: 0   methocarbamol (ROBAXIN-750) 750 MG tablet, Take 1 tablet (750 mg total) by mouth every 8 (eight) hours as needed for muscle spasms., Disp: 40 tablet, Rfl: 0   methylPREDNISolone (MEDROL) 4 MG tablet, Standard 6 day dose course., Disp: 21 tablet, Rfl: 0   naproxen  (NAPROSYN) 500 MG tablet, Take 1 tablet (500 mg total) by mouth 2 (two) times daily with a meal., Disp: 30 tablet, Rfl: 0   ondansetron (ZOFRAN-ODT) 4 MG disintegrating tablet, Take 1 tablet (4 mg total) by mouth every 8 (eight) hours as needed., Disp: 20 tablet, Rfl: 0   promethazine-dextromethorphan (PROMETHAZINE-DM) 6.25-15 MG/5ML syrup, Take 5 mLs by mouth 4 (four) times daily as needed., Disp: 118 mL, Rfl: 0   SAXENDA 18 MG/3ML SOPN, INJECT 3 MG INTO THE SKIN DAILY., Disp: 3 mL, Rfl: 2   tiZANidine (ZANAFLEX) 2 MG tablet, Take 1 tablet (2 mg total) by mouth every 8 (eight) hours as needed for muscle spasms., Disp: 30 tablet, Rfl: 0   Turmeric 500 MG CAPS, Take 1 capsule by mouth 2 (two) times daily., Disp: , Rfl:    VITAMIN D PO, Take 1,000 Units by mouth 2 (two) times daily., Disp: , Rfl:    zolpidem (AMBIEN) 5 MG tablet, Take 5 mg by mouth at bedtime as needed  for sleep., Disp: , Rfl:   Observations/Objective: Patient is well-developed, well-nourished in no acute distress.  Resting comfortably at home.  Head is normocephalic, atraumatic.  No labored breathing.  Speech is clear and coherent with logical content.  Patient is alert and oriented at baseline.    Assessment and Plan: 1. Acute bacterial sinusitis - amoxicillin-clavulanate (AUGMENTIN) 875-125 MG tablet; Take 1 tablet by mouth 2 (two) times daily.  Dispense: 20 tablet; Refill: 0  - Worsening symptoms that have not responded to OTC medications.  - Will give Augmentin - Continue allergy medications.  - Steam and humidifier can help - Stay well hydrated and get plenty of rest.  - Seek in person evaluation if no symptom improvement or if symptoms worsen   Follow Up Instructions: I discussed the assessment and treatment plan with the patient. The patient was provided an opportunity to ask questions and all were answered. The patient agreed with the plan and demonstrated an understanding of the instructions.  A copy of  instructions were sent to the patient via MyChart unless otherwise noted below.    The patient was advised to call back or seek an in-person evaluation if the symptoms worsen or if the condition fails to improve as anticipated.  Time:  I spent 10 minutes with the patient via telehealth technology discussing the above problems/concerns.    Margaretann Loveless, PA-C

## 2023-06-17 NOTE — Patient Instructions (Signed)
Rosaura Carpenter, thank you for joining Margaretann Loveless, PA-C for today's virtual visit.  While this provider is not your primary care provider (PCP), if your PCP is located in our provider database this encounter information will be shared with them immediately following your visit.   A [facility] MyChart account gives you access to today's visit and all your visits, tests, and labs performed at [facility] " click here if you don't have a [facility] MyChart account or go to [email or web address]  Consent: (Patient) Rosaura Carpenter provided verbal consent for this virtual visit at the beginning of the encounter.  Current Medications:  Current Outpatient Medications:    amoxicillin-clavulanate (AUGMENTIN) 875-125 MG tablet, Take 1 tablet by mouth 2 (two) times daily., Disp: 20 tablet, Rfl: 0   ALPRAZolam (XANAX) 0.5 MG tablet, alprazolam 0.5 mg tablet  TAKE 1 TABLET BY MOUTH THREE TIMES A DAY AS NEEDED FOR ANXIETY, Disp: , Rfl:    AMBULATORY NON FORMULARY MEDICATION, Medication Name: nitroglycerin 0.125 % twice daily for 6-8 weeks, Disp: 1 Tube, Rfl: 3   aspirin 81 MG tablet, Take 81 mg by mouth every other day.  (Patient not taking: Reported on 11/26/2022), Disp: , Rfl:    buPROPion (WELLBUTRIN XL) 300 MG 24 hr tablet, Take 300 mg by mouth every morning., Disp: , Rfl:    busPIRone (BUSPAR) 15 MG tablet, TAKE 1 TABLET BY MOUTH TWICE A DAY, Disp: 180 tablet, Rfl: 2   ezetimibe (ZETIA) 10 MG tablet, TAKE 1 TABLET BY MOUTH EVERY DAY, Disp: 90 tablet, Rfl: 3   fluticasone (FLONASE) 50 MCG/ACT nasal spray, Place 2 sprays into both nostrils daily., Disp: 16 g, Rfl: 0   Insulin Pen Needle (B-D ULTRAFINE III SHORT PEN) 31G X 8 MM MISC, USE AS DIRECTED TO INJECT SAXENDA, Disp: 100 each, Rfl: 3   lidocaine (XYLOCAINE) 2 % solution, Swallow 5mL every 6 hours as needed for sore throat, Disp: 100 mL, Rfl: 0   metFORMIN (GLUCOPHAGE) 500 MG tablet, TAKE 1 TABLET BY MOUTH TWICE A DAY WITH FOOD,  Disp: 180 tablet, Rfl: 0   methocarbamol (ROBAXIN-750) 750 MG tablet, Take 1 tablet (750 mg total) by mouth every 8 (eight) hours as needed for muscle spasms., Disp: 40 tablet, Rfl: 0   methylPREDNISolone (MEDROL) 4 MG tablet, Standard 6 day dose course., Disp: 21 tablet, Rfl: 0   naproxen (NAPROSYN) 500 MG tablet, Take 1 tablet (500 mg total) by mouth 2 (two) times daily with a meal., Disp: 30 tablet, Rfl: 0   ondansetron (ZOFRAN-ODT) 4 MG disintegrating tablet, Take 1 tablet (4 mg total) by mouth every 8 (eight) hours as needed., Disp: 20 tablet, Rfl: 0   promethazine-dextromethorphan (PROMETHAZINE-DM) 6.25-15 MG/5ML syrup, Take 5 mLs by mouth 4 (four) times daily as needed., Disp: 118 mL, Rfl: 0   SAXENDA 18 MG/3ML SOPN, INJECT 3 MG INTO THE SKIN DAILY., Disp: 3 mL, Rfl: 2   tiZANidine (ZANAFLEX) 2 MG tablet, Take 1 tablet (2 mg total) by mouth every 8 (eight) hours as needed for muscle spasms., Disp: 30 tablet, Rfl: 0   Turmeric 500 MG CAPS, Take 1 capsule by mouth 2 (two) times daily., Disp: , Rfl:    VITAMIN D PO, Take 1,000 Units by mouth 2 (two) times daily., Disp: , Rfl:    zolpidem (AMBIEN) 5 MG tablet, Take 5 mg by mouth at bedtime as needed for sleep., Disp: , Rfl:    Medications ordered in this encounter:  Meds ordered  this encounter  Medications   amoxicillin-clavulanate (AUGMENTIN) 875-125 MG tablet    Sig: Take 1 tablet by mouth 2 (two) times daily.    Dispense:  20 tablet    Refill:  0    Order Specific Question:   Supervising Provider    Answer:   Merrilee Jansky [id number]     *If you need refills on other medications prior to your next appointment, please contact your pharmacy*  Follow-Up: Call back or seek an in-person evaluation if the symptoms worsen or if the condition fails to improve as anticipated.  [facility] [phone]  Other Instructions Sinus Infection, Adult A sinus infection, also called sinusitis, is inflammation of your sinuses.  Sinuses are hollow spaces in the bones around your face. Your sinuses are located: Around your eyes. In the middle of your forehead. Behind your nose. In your cheekbones. Mucus normally drains out of your sinuses. When your nasal tissues become inflamed or swollen, mucus can become trapped or blocked. This allows bacteria, viruses, and fungi to grow, which leads to infection. Most infections of the sinuses are caused by a virus. A sinus infection can develop quickly. It can last for up to 4 weeks (acute) or for more than 12 weeks (chronic). A sinus infection often develops after a cold. What are the causes? This condition is caused by anything that creates swelling in the sinuses or stops mucus from draining. This includes: Allergies. Asthma. Infection from bacteria or viruses. Deformities or blockages in your nose or sinuses. Abnormal growths in the nose (nasal polyps). Pollutants, such as chemicals or irritants in the air. Infection from fungi. This is rare. What increases the risk? You are more likely to develop this condition if you: Have a weak body defense system (immune system). Do a lot of swimming or diving. Overuse nasal sprays. Smoke. What are the signs or symptoms? The main symptoms of this condition are pain and a feeling of pressure around the affected sinuses. Other symptoms include: Stuffy nose or congestion that makes it difficult to breathe through your nose. Thick yellow or greenish drainage from your nose. Tenderness, swelling, and warmth over the affected sinuses. A cough that may get worse at night. Decreased sense of smell and taste. Extra mucus that collects in the throat or the back of the nose (postnasal drip) causing a sore throat or bad breath. Tiredness (fatigue). Fever. How is this diagnosed? This condition is diagnosed based on: Your symptoms. Your medical history. A physical exam. Tests to find out if your condition is acute or chronic. This may  include: Checking your nose for nasal polyps. Viewing your sinuses using a device that has a light (endoscope). Testing for allergies or bacteria. Imaging tests, such as an MRI or CT scan. In rare cases, a bone biopsy may be done to rule out more serious types of fungal sinus disease. How is this treated? Treatment for a sinus infection depends on the cause and whether your condition is chronic or acute. If caused by a virus, your symptoms should go away on their own within 10 days. You may be given medicines to relieve symptoms. They include: Medicines that shrink swollen nasal passages (decongestants). A spray that eases inflammation of the nostrils (topical intranasal corticosteroids). Rinses that help get rid of thick mucus in your nose (nasal saline washes). Medicines that treat allergies (antihistamines). Over-the-counter pain relievers. If caused by bacteria, your health care provider may recommend waiting to see if your symptoms improve.  Most bacterial infections will get better without antibiotic medicine. You may be given antibiotics if you have: A severe infection. A weak immune system. If caused by narrow nasal passages or nasal polyps, surgery may be needed. Follow these instructions at home: Medicines Take, use, or apply over-the-counter and prescription medicines only as told by your health care provider. These may include nasal sprays. If you were prescribed an antibiotic medicine, take it as told by your health care provider. Do not stop taking the antibiotic even if you start to feel better. Hydrate and humidify  Drink enough fluid to keep your urine pale yellow. Staying hydrated will help to thin your mucus. Use a cool mist humidifier to keep the humidity level in your home above 50%. Inhale steam for 10-15 minutes, 3-4 times a day, or as told by your health care provider. You can do this in the bathroom while a hot shower is running. Limit your exposure to cool or dry  air. Rest Rest as much as possible. Sleep with your head raised (elevated). Make sure you get enough sleep each night. General instructions  Apply a warm, moist washcloth to your face 3-4 times a day or as told by your health care provider. This will help with discomfort. Use nasal saline washes as often as told by your health care provider. Wash your hands often with soap and water to reduce your exposure to germs. If soap and water are not available, use hand sanitizer. Do not smoke. Avoid being around people who are smoking (secondhand smoke). Keep all follow-up visits. This is important. Contact a health care provider if: You have a fever. Your symptoms get worse. Your symptoms do not improve within 10 days. Get help right away if: You have a severe headache. You have persistent vomiting. You have severe pain or swelling around your face or eyes. You have vision problems. You develop confusion. Your neck is stiff. You have trouble breathing. These symptoms may be an emergency. Get help right away. Call 911. Do not wait to see if the symptoms will go away. Do not drive yourself to the hospital. Summary A sinus infection is soreness and inflammation of your sinuses. Sinuses are hollow spaces in the bones around your face. This condition is caused by nasal tissues that become inflamed or swollen. The swelling traps or blocks the flow of mucus. This allows bacteria, viruses, and fungi to grow, which leads to infection. If you were prescribed an antibiotic medicine, take it as told by your health care provider. Do not stop taking the antibiotic even if you start to feel better. Keep all follow-up visits. This is important. This information is not intended to replace advice given to you by your health care provider. Make sure you discuss any questions you have with your health care provider. Document Revised: 11/17/2021 Document Reviewed: 11/17/2021 Elsevier Patient Education  2024  [employer]    If you have been instructed to have an in-person evaluation today at a local Urgent Care facility, please use the link below. It will take you to a list of all of our available [facility], including address, phone number and hours of operation. Please do not delay care.  [facility]  If you or a family member do not have a primary care provider, use the link below to schedule a visit and establish care. When you choose a [facility] primary care physician or advanced practice provider, you gain a long-term partner in  health. Find a Primary Care Provider  Learn more about [facility]'s in-office and virtual care options: [facility]

## 2023-06-21 ENCOUNTER — Ambulatory Visit: Payer: BC Managed Care – PPO

## 2023-06-21 ENCOUNTER — Other Ambulatory Visit: Payer: Self-pay

## 2023-06-21 DIAGNOSIS — M542 Cervicalgia: Secondary | ICD-10-CM | POA: Diagnosis not present

## 2023-06-21 DIAGNOSIS — R293 Abnormal posture: Secondary | ICD-10-CM | POA: Diagnosis not present

## 2023-06-21 DIAGNOSIS — M25611 Stiffness of right shoulder, not elsewhere classified: Secondary | ICD-10-CM

## 2023-06-21 NOTE — Therapy (Signed)
OUTPATIENT PHYSICAL THERAPY CERVICAL TREATMENT   Patient Name: Ann Frederick MRN: [id number] DOB:30-Jan-1971, 52 y.o., female Today's Date: 06/21/2023  END OF SESSION:  PT End of Session - 06/21/23 0851     Visit Number 4    Date for PT Re-Evaluation 07/28/23    Progress Note Due on Visit 10    PT Start Time 0848    PT Stop Time 0930    PT Time Calculation (min) 42 min    Activity Tolerance Patient tolerated treatment well    Behavior During Therapy WFL for tasks assessed/performed              Past Medical History:  Diagnosis Date   Acne    Anemia    history of   Anxiety    Arthritis    Depression    GERD (gastroesophageal reflux disease)    worse while pregnant   Headache    Migraines   Heart murmur    ? heart murmur in past   History of blood transfusion 2013   Hypertension    Metabolic syndrome    Past Surgical History:  Procedure Laterality Date   ANTERIOR FUSION CERVICAL SPINE  2018   CESAREAN SECTION     x 2   CESAREAN SECTION  09/29/2012   Procedure: CESAREAN SECTION;  Surgeon: Turner Daniels, MD;  Location: [facility];  Service: Obstetrics;  Laterality: N/A;  Repeat edc 10/12/12/REQUEST;Chassity,Dee,Colleen   COLONOSCOPY     ESOPHAGOGASTRODUODENOSCOPY  normal    7/12   JOINT REPLACEMENT Bilateral    LAPAROSCOPIC VAGINAL HYSTERECTOMY WITH SALPINGECTOMY Bilateral 02/23/2016   Procedure: LAPAROSCOPIC ASSISTED VAGINAL HYSTERECTOMY WITH bilateral SALPINGECTOMY, right oophorectomy. laporotic repair of incidental cystotomy.;  Surgeon: Candice Camp, MD;  Location: [facility];  Service: Gynecology;  Laterality: Bilateral;   MOUTH SURGERY     Patient Active Problem List   Diagnosis Date Noted   Left hip pain 08/27/2019   Class 1 obesity due to excess calories without serious comorbidity with body mass index (BMI) of 34.0 to 34.9 in adult 11/28/2018   Heart palpitations 11/28/2018   OSA (obstructive sleep apnea) 09/14/2018   Neck pain 04/26/2018   Wellness  examination 03/08/2016   S/P laparoscopic assisted vaginal hysterectomy (LAVH) 02/23/2016   Pink eye 06/19/2015   Pain of right thumb 06/19/2015   Sinusitis, acute frontal 02/05/2015   Headache around the eyes 02/05/2015   Back pain 11/01/2014   Routine general medical examination at a health care facility 10/30/2013   Vesicular rash 09/07/2013   Sinusitis 02/02/2013   Viral pharyngitis 03/03/2012   Flank pain 08/13/2011   Inguinal adenopathy 08/13/2011   Dysphagia 04/20/2011   GERD 01/29/2011   FATIGUE 01/29/2011   SCIATICA, BILATERAL 06/18/2010   LEG PAIN, BILATERAL 06/06/2010   HIP PAIN, LEFT 04/01/2010   MASS, LOCALIZED, SUPERFICIAL 03/05/2009   ACNE VULGARIS 07/11/2007   MURMUR 07/11/2007    PCP: Carolin Guernsey PROVIDER: Donalee Citrin  REFERRING DIAG: cervicalgia  THERAPY DIAG:  Stiffness of right shoulder, not elsewhere classified  Cervicalgia  Abnormal posture  Rationale for Evaluation and Treatment: Rehabilitation  ONSET DATE: 03/16/23  SUBJECTIVE:  SUBJECTIVE STATEMENT: The patient reports she has had an upper respiratory infection/cold for several weeks, she has finally started an antibiotic and these Sx are improving. Reports hasn't felt well and hasn't participated in any cardiovascular or strengthening ex  Eval: 6 weeks ago L sided stiff neck occiput to L upper traps, really painful, locked up eventually, then switched to R side, developed numbness dorsal hands, fingers.  Took naproxen which worked, flexiril works as well now sore and tingling R C 7/7 area, hand and forearm, no specific pattern noted . Sometimes interferes with sleep, wakes her up, difficulty going to sleep  Hand dominance: Right  PERTINENT HISTORY:  H/o cervical fusion C 3 to C7  2018, also hip replacements R 2023  PAIN:  Are you having pain? Yes: NPRS scale: 3/10 Pain location: R upper traps region Pain description: aching, sore, locks up Aggravating factors: no specific pattern Relieving factors: meds somewhat  PRECAUTIONS: None  WEIGHT BEARING RESTRICTIONS: No  FALLS:  Has patient fallen in last 6 months? 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  OBJECTIVE:   DIAGNOSTIC FINDINGS:  Per neurologist's notes some spinal stenosis above and below surgical sites, not encroaching spinal cord  PATIENT SURVEYS:  FOTO 51 neck  COGNITION: Overall cognitive status: Within functional limits for tasks assessed  SENSATION: Reports tingling/ aching along radial nerve pathway, C7.  No sensory deficits noted  POSTURE: in standing R iliac crest elevated approx. 2 cm, R scapula and shoulder depressed. Mild concavity R lumbar region, mild R lat shift pelvis.  Increased thoracic kyphosis noted mid thoracic T 4 to T 8 region  PALPATION: Point tender R upper traps,R  supraspinatus, R infraspinatus,decreased PA mobility noted R post ribs as compared to L  CERVICAL ROM:   Active ROM A/PROM (deg) eval  Flexion 50  Extension 50  Right lateral flexion nt  Left lateral flexion nt  Right rotation 45  Left rotation 45   (Blank rows = not tested)  UPPER EXTREMITY ROM: All UE ROM wfl, discomfort/"soreness" noted by patient R side transitioning from R shoulder abduction to rest position and with IR behind back   UPPER EXTREMITY MMT:  MMT Right eval Left eval  Shoulder flexion 5 5  Shoulder extension 5 5  Shoulder abduction 5 5  Shoulder adduction 5 5  Shoulder extension 5 5  Shoulder internal rotation 4 4  Shoulder external rotation 5 5  Middle  trapezius 4 4  Lower trapezius 3+ 4-  Elbow flexion 5 5  Elbow extension 5 5   (Blank rows = not tested)   TODAY'S TREATMENT:  DATE:  06/21/23:  Manual:  Prone for manual techniques as follows:  Trigger Point Dry-Needling  Treatment instructions: Expect mild to moderate muscle soreness. S/S of pneumothorax if dry needled over a lung field, and to seek immediate medical attention should they occur. Patient verbalized understanding of these instructions and education. Patient Consent Given: Yes Education handout provided: Previously provided Muscles treated: B upper traps, 2 pts each, R infraspinatus Electrical stimulation performed: No Parameters: N/A Treatment response/outcome: Twitch Response Elicited  Theragun for B quads, vastus lateralis musculature, the patient also utilized to determine whether she might benefit from one for home use  Prone for PA stabilization of R post hip jt with ant hip/ quads stretch  Therex:  the patient was able to demonstrate her previously instructed ex including :  needed some cues on technique but otherwise able to perform well from memory Red t band B shoulder ER Red t band B shoulder horiz abd Red t band diagonals, Red t band shoulder IR 06/07/23:Manual:  Prone for PA stabilization R gr trochanter and manual stretching of R ant hip jt, quads, hip flexors, 45 sec holds, 3 reps Trigger Point Dry-Needling  Treatment instructions: Expect mild to moderate muscle soreness. S/S of pneumothorax if dry needled over a lung field, and to seek immediate medical attention should they occur. Patient verbalized understanding of these instructions and education. Patient Consent Given: Yes Education handout provided: No Muscles treated: R upper traps, R infraspinatus, 2 pts each Electrical stimulation performed: No Parameters:  N/A Treatment response/outcome: Twitch Response Elicited and Palpable Increase in Muscle Length Therapeutic exercise: The patient was instructed/ reviewed the following ex: 6"Foam roller for pecs stretch and B shoulder flexion, static and dynamic stretching Prone B quads myofascial release with foam roller Red t band B shoulder ER Red t band B shoulder horiz abd Red t band diagonals, Red t band shoulder IR Also inst in seated quads and IT band self myofascial release with massage stick and foam roller.   05/18/23 UBE L2 x 6 min 50fwd/3bwd Foam roller: back, glutes, lat glutes, HS, quads and gluteals 18 Seated ER RTB x 12 Seated horizontal ABD x 12 Standing diagonals x 10 ea RTB RTB IR/ER in door x 10 ea Manual Therapy: to decrease muscle spasm and pain and improve mobility Skilled palpation and monitoring of soft tissues during DN STM to R UT and IS Trigger Point Dry-Needling  Treatment instructions: Expect mild to moderate muscle soreness. S/S of pneumothorax if dry needled over a lung field, and to seek immediate medical attention should they occur. Patient verbalized understanding of these instructions and education. Patient Consent Given: Yes Education handout provided: No Muscles treated: R upper traps, R infraspinatus Electrical stimulation performed: No Parameters: N/A Treatment response/outcome: Twitch Response Elicited   Instructed in therex as described below, also patient education in evaluating posture at her work station, also elongating L step length and trying to maintain equal stride length B, noted slight shortening L stride length and decreased R hip ext movement terminal stance.  05/05/23  Manual:  prone for gentle PA gr 2 mobs B mid post ribs Trigger Point Dry-Needling  Treatment instructions: Expect mild to moderate muscle soreness. S/S of pneumothorax if dry needled over a lung field, and to seek immediate medical attention should they occur. Patient verbalized  understanding of these instructions and education. Patient Consent Given: Yes Education handout provided: No Muscles treated: R upper traps, R infraspinatus Electrical stimulation performed: No Parameters: N/A Treatment response/outcome: Twitch  Response Elicited   Instructed in therex as described below, also patient education in evaluating posture at her work station, also elongating L step length and trying to maintain equal stride length B, noted slight shortening L stride length and decreased R hip ext movement terminal stance.  PATIENT EDUCATION:  Education details: POC, Goals Person educated: Patient Education method: Explanation, Demonstration, Tactile cues, Verbal cues, and Handouts Education comprehension: verbalized understanding, returned demonstration, and verbal cues required  HOME EXERCISE PROGRAM: Access Code: [email or web address] URL: [email or web address] Date: 05/18/2023 Prepared by: Raynelle Fanning  Exercises - Seated Shoulder Horizontal Abduction with Resistance - Palms Down  - 3 x daily - 7 x weekly - 1 sets - 10 reps - Standing Shoulder External Rotation with Resistance  - 3 x daily - 7 x weekly - 1 sets - 10 reps - Hip Flexor Stretch at Edge of Bed  - 3 x daily - 7 x weekly - 1 sets - 10 reps - Shoulder External Rotation with Anchored Resistance  - 1 x daily - 3-4 x weekly - 3 sets - 10 reps - Shoulder Internal Rotation with Resistance  - 1 x daily - 3-4 x weekly - 3 sets - 10 reps - Standing Shoulder Diagonal Horizontal Abduction 60/120 Degrees with Resistance  - 1 x daily - 3-4 x weekly - 3 sets - 10 reps - Thoracic Mobilization with Hands Behind Head on Foam Roll  - 1 x daily - 7 x weekly - 1 sets - 1 reps - Quadriceps Mobilization with Foam Roll  - 1 x daily - 3-4 x weekly - 1 sets - 10 reps - Gluteus Mobilization with Foam Roll  - 1 x daily - 3-4 x weekly - 1 sets - 10 reps - Figure 4 Gluteus Mobilization on Foam Roll  - 1 x daily - 3-4 x weekly - 1 sets - 10  reps  ASSESSMENT:  CLINICAL IMPRESSION: Kataleyah returned today for skilled PT intervention for chronic neck and upper back pain and r hip stiffness.  Overall improved although slowed by persistent upper respiratory infection that has lasted several weeks and affected her endurance.     Good response to DN again today. Thresia continues to demonstrate potential for improvement and would benefit from continued skilled therapy to address impairments.   OBJECTIVE IMPAIRMENTS: decreased ROM, decreased strength, hypomobility, impaired flexibility, impaired UE functional use, and pain.   ACTIVITY LIMITATIONS: lifting, sleeping, and reach over head  PARTICIPATION LIMITATIONS: community activity and yard work  PERSONAL FACTORS: Age, Past/current experiences, Profession, Time since onset of injury/illness/exacerbation, and 1-2 comorbidities: B THA, previous c spine fusion  are also affecting patient's functional outcome.   REHAB POTENTIAL: Good  CLINICAL DECISION MAKING: Stable/uncomplicated  EVALUATION COMPLEXITY: Low   GOALS: Goals reviewed with patient? Yes GOALS: Goals reviewed with patient? Yes  SHORT TERM GOALS: Target date: 05/19/23 Patient will be independent with initial HEP.  Baseline: initiated at eval Goal status: MET    LONG TERM GOALS: Target date: 12 weeks 07/28/23 Patient will be independent with advanced/ongoing HEP to improve outcomes and carryover.  Baseline: initiated at eval Goal status: INITIAL  2.  Patient will report improvement in neck pain to allow for 505 improvement in sleep to improve QOL.  Baseline: multiple sleep interruptions, and difficulty falling asleep Goal status: INITIAL  3.  Patient will demonstrate increased  cervical ROM for rotation to 60 degrees safety with driving.  Baseline: 45 Goal status: INITIAL  4.  Patient will report 63 on  FOTO neck(patient outcome measure)  to demonstrate improved functional ability.  Baseline: 51 Goal status:  INITIAL  5.  Patient will demonstrate improved strength B shoulder IR, horizontal abd, and lower traps to 5/5 Baseline: 3+ to 4/5 Goal status: IN PROGRESS   PLAN:  PT FREQUENCY: 1-2x/week  PT DURATION: 12 weeks  PLANNED INTERVENTIONS: Therapeutic exercises, Therapeutic activity, Neuromuscular re-education, Balance training, Gait training, Patient/Family education, Self Care, Joint mobilization, and Manual therapy  PLAN FOR NEXT SESSION: How was dry needling, how was therex.  Continue dry needling, assess and stretch upper cervical spine to improve rotation mobility, mobilize thoracic spine and ribs, reassess strength B shoulders   Tam Delisle L Ivonne [place], PT 06/21/2023, 12:55 PM

## 2023-06-24 ENCOUNTER — Encounter (HOSPITAL_BASED_OUTPATIENT_CLINIC_OR_DEPARTMENT_OTHER): Payer: Self-pay | Admitting: Cardiology

## 2023-06-24 ENCOUNTER — Ambulatory Visit (INDEPENDENT_AMBULATORY_CARE_PROVIDER_SITE_OTHER): Payer: BC Managed Care – PPO | Admitting: Cardiology

## 2023-06-24 VITALS — BP 124/86 | HR 97 | Ht 62.0 in | Wt 174.0 lb

## 2023-06-24 DIAGNOSIS — Z713 Dietary counseling and surveillance: Secondary | ICD-10-CM | POA: Diagnosis not present

## 2023-06-24 DIAGNOSIS — Z6831 Body mass index (BMI) 31.0-31.9, adult: Secondary | ICD-10-CM

## 2023-06-24 DIAGNOSIS — I10 Essential (primary) hypertension: Secondary | ICD-10-CM | POA: Diagnosis not present

## 2023-06-24 DIAGNOSIS — E6609 Other obesity due to excess calories: Secondary | ICD-10-CM | POA: Diagnosis not present

## 2023-06-24 DIAGNOSIS — Z7189 Other specified counseling: Secondary | ICD-10-CM | POA: Diagnosis not present

## 2023-06-24 DIAGNOSIS — Z7182 Exercise counseling: Secondary | ICD-10-CM

## 2023-06-24 NOTE — Progress Notes (Signed)
Cardiology Office Note:  .   Date:  06/24/2023  ID:  Rosaura Carpenter, DOB 21-Feb-1971, MRN [id number] PCP: Marisue Brooklyn  [facility] Providers Cardiologist:  Jodelle Red, MD {  History of Present Illness: .   Ann Frederick is a 52 y.o. female with a hx of hypertension, diabetes who is seen in follow up. She was first seen on 08/31/18 for tachycardia and palpitations   Cardiac history: first noted ~2018 that her heart rate would go to 150 bpm when exercising. Resting heart rate in the 80s usually, but was up to around 100 bpm resting early 2019. Had a cervical fusion summer 2019 and was less active around that time. Had prior history of chest pain in 2021, improved with toradol.  Today: Had URI last month, took a long time to recover. Still working on weight, has saxenda at home, but she is trying to minimize meds.  BP cuff from home checked today. Systolic is good, but diastolic is above goal. On no meds currently.  Discussed lifestyle at length today. BMI 31 today. Is cooking her own food, trying to increase activity.  No further issues with chest pain.  ROS: Denies chest pain, shortness of breath at rest or with normal exertion. No PND, orthopnea, LE edema or unexpected weight gain. No syncope or palpitations. ROS otherwise negative except as noted.   Studies Reviewed: Marland Kitchen    EKG:     not ordered today  Physical Exam:   VS:  BP 124/86   Pulse 97   Ht 5\' 2"  (1.575 m)   Wt 174 lb (78.9 kg)   LMP 02/11/2016 (Exact Date)   SpO2 98%   BMI 31.83 kg/m    Wt Readings from Last 3 Encounters:  06/24/23 174 lb (78.9 kg)  03/29/23 175 lb (79.4 kg)  12/31/22 171 lb 1.6 oz (77.6 kg)    GEN: Well nourished, well developed in no acute distress HEENT: Normal, moist mucous membranes NECK: No JVD CARDIAC: regular rhythm, normal S1 and S2, no rubs or gallops. No murmur. VASCULAR: Radial and DP pulses 2+ bilaterally. No carotid bruits RESPIRATORY:  Clear to  auscultation without rales, wheezing or rhonchi  ABDOMEN: Soft, non-tender, non-distended MUSCULOSKELETAL:  Ambulates independently SKIN: Warm and dry, no edema NEUROLOGIC:  Alert and oriented x 3. No focal neuro deficits noted. PSYCHIATRIC:  Normal affect    ASSESSMENT AND PLAN: .    Hypertension:  -near goal today (diastolic above goal), not currently on antihypertensives (previously on HCTZ and losartan)  Obesity -has saxenda at home but doesn't use routinely. She will discuss with her PCP to see if a weekly med would be covered.  -discussed lifestyle today, she is working hard on this  CV risk counseling and prevention -recommend heart healthy/Mediterranean diet, with whole grains, fruits, vegetable, fish, lean meats, nuts, and olive oil. Limit salt. -recommend moderate walking, 3-5 times/week for 30-50 minutes each session. Aim for at least 150 minutes.week. Goal should be pace of 3 miles/hours, or walking 1.5 miles in 30 minutes -recommend avoidance of tobacco products. Avoid excess alcohol. -ASCVD risk score: The 10-year ASCVD risk score (Arnett DK, et al., 2019) is: 3.3%   Values used to calculate the score:     Age: 52 years     Sex: Female     Is Non-Hispanic African American: No     Diabetic: Yes     Tobacco smoker: No     Systolic Blood Pressure: 124 mmHg  Is BP treated: No     HDL Cholesterol: 48.1 mg/dL     Total Cholesterol: 221 mg/dL    Dispo: 1 year  Signed, Jodelle Red, MD   Jodelle Red, MD, PhD, [facility]  [facility]  [facility] [address], [address] [place], [place] [address] [phone]

## 2023-06-24 NOTE — Patient Instructions (Signed)
Medication Instructions:  Your physician recommends that you continue on your current medications as directed. Please refer to the Current Medication list given to you today.  *If you need a refill on your cardiac medications before your next appointment, please call your pharmacy*   Follow-Up: At [facility], you and your health needs are our priority.  As part of our continuing mission to provide you with exceptional heart care, we have created designated Provider Care Teams.  These Care Teams include your primary Cardiologist (physician) and Advanced Practice Providers (APPs -  Physician Assistants and Nurse Practitioners) who all work together to provide you with the care you need, when you need it.   Your next appointment:   1 year(s)  Provider:   Bridgette Christopher, MD     

## 2023-06-25 ENCOUNTER — Encounter (HOSPITAL_BASED_OUTPATIENT_CLINIC_OR_DEPARTMENT_OTHER): Payer: Self-pay

## 2023-06-25 ENCOUNTER — Other Ambulatory Visit: Payer: Self-pay

## 2023-06-25 ENCOUNTER — Emergency Department (HOSPITAL_BASED_OUTPATIENT_CLINIC_OR_DEPARTMENT_OTHER): Payer: BC Managed Care – PPO

## 2023-06-25 ENCOUNTER — Emergency Department (HOSPITAL_BASED_OUTPATIENT_CLINIC_OR_DEPARTMENT_OTHER)
Admission: EM | Admit: 2023-06-25 | Discharge: 2023-06-25 | Disposition: A | Discharge status: Home / Self Care | Payer: BC Managed Care – PPO | Attending: Emergency Medicine | Admitting: Emergency Medicine

## 2023-06-25 DIAGNOSIS — R072 Precordial pain: Secondary | ICD-10-CM | POA: Diagnosis not present

## 2023-06-25 DIAGNOSIS — I10 Essential (primary) hypertension: Secondary | ICD-10-CM | POA: Insufficient documentation

## 2023-06-25 DIAGNOSIS — D72829 Elevated white blood cell count, unspecified: Secondary | ICD-10-CM | POA: Insufficient documentation

## 2023-06-25 DIAGNOSIS — D75839 Thrombocytosis, unspecified: Secondary | ICD-10-CM | POA: Diagnosis not present

## 2023-06-25 DIAGNOSIS — Z79899 Other long term (current) drug therapy: Secondary | ICD-10-CM | POA: Diagnosis not present

## 2023-06-25 DIAGNOSIS — R079 Chest pain, unspecified: Secondary | ICD-10-CM | POA: Diagnosis not present

## 2023-06-25 LAB — BASIC METABOLIC PANEL
Anion gap: 10 (ref 5–15)
BUN: 9 mg/dL (ref 6–20)
CO2: 25 mmol/L (ref 22–32)
Calcium: 9.2 mg/dL (ref 8.9–10.3)
Chloride: 103 mmol/L (ref 98–111)
Creatinine, Ser: 0.82 mg/dL (ref 0.44–1.00)
GFR, Estimated: >60 mL/min (ref >60)
Glucose, Bld: 135 mg/dL — ABNORMAL HIGH (ref 70–99)
Potassium: 3.9 mmol/L (ref 3.5–5.1)
Sodium: 138 mmol/L (ref 135–145)

## 2023-06-25 LAB — CBC
HCT: 38.9 % (ref 36.0–46.0)
Hemoglobin: 13 g/dL (ref 12.0–15.0)
MCH: 31.2 pg (ref 26.0–34.0)
MCHC: 33.4 g/dL (ref 30.0–36.0)
MCV: 93.3 fL (ref 80.0–100.0)
Platelets: 517 10*3/uL — ABNORMAL HIGH (ref 150–400)
RBC: 4.17 MIL/uL (ref 3.87–5.11)
RDW: 12.7 % (ref 11.5–15.5)
WBC: 13.9 10*3/uL — ABNORMAL HIGH (ref 4.0–10.5)
nRBC: 0 % (ref 0.0–0.2)

## 2023-06-25 LAB — TROPONIN I (HIGH SENSITIVITY)
Troponin I (High Sensitivity): 2 ng/L (ref <18)
Troponin I (High Sensitivity): 2 ng/L (ref <18)

## 2023-06-25 MED ORDER — OMEPRAZOLE 20 MG PO CPDR: 20.0000 mg | DELAYED_RELEASE_CAPSULE | Freq: Every day | ORAL | 0 refills | Status: DC

## 2023-06-25 MED ORDER — KETOROLAC TROMETHAMINE 60 MG/2ML IM SOLN
60.0000 mg | Freq: Once | INTRAMUSCULAR | Status: AC
  Administered 2023-06-25: 60 mg via INTRAMUSCULAR
  Filled 2023-06-25: qty 2

## 2023-06-25 MED ORDER — SUCRALFATE 1 GM/10ML PO SUSP
1.0000 g | Freq: Three times a day (TID) | ORAL | Status: DC
  Administered 2023-06-25: 1 g via ORAL
  Filled 2023-06-25: qty 10

## 2023-06-25 MED ORDER — ALUM & MAG HYDROXIDE-SIMETH 200-200-20 MG/5ML PO SUSP
30.0000 mL | Freq: Once | ORAL | Status: AC
  Administered 2023-06-25: 30 mL via ORAL
  Filled 2023-06-25: qty 30

## 2023-06-25 NOTE — ED Notes (Signed)
Patient taken to xray at this time.

## 2023-06-25 NOTE — ED Triage Notes (Signed)
Pt with CP that radiates around both sides of rib cage directly to center of back. Pt thought it was gas and attempted to take medicine to help and it is worse. Pt denies hx of MI; hx of HTN. Pt nauseous and intermittently vomiting.

## 2023-06-25 NOTE — ED Provider Notes (Signed)
[facility] Provider Note   CSN: [id number] Arrival date & time: 06/25/23  0154     History  Chief Complaint  Patient presents with   Chest Pain    Ann Frederick is a 52 y.o. female.  The history is provided by the patient.  Chest Pain Pain location:  Substernal area, L chest and R chest Pain quality: dull and radiating   Pain radiates to:  Upper back Pain severity:  Moderate Onset quality:  Sudden Timing:  Constant Progression:  Unchanged Chronicity:  New Context: at rest   Relieved by:  Nothing Worsened by:  Nothing Associated symptoms: heartburn   Associated symptoms: no abdominal pain, no cough, no fever, no orthopnea, no palpitations, no shortness of breath and no weakness   Associated symptoms comment:  Feels like she has gas but cannot pass it  Risk factors: no diabetes mellitus and not female   Patient with HTN who was seen by cardiology earlier in the day presents with SSCP with radiation to the back. She states she had heartburn earlier in the week but not today.  Patient feels she has gas trapped but it wont come out.  No SOb, no exertional symptoms.  No pleuritic component, no leg pain no OCP no travel.      Past Medical History:  Diagnosis Date   Acne    Anemia    history of   Anxiety    Arthritis    Depression    GERD (gastroesophageal reflux disease)    worse while pregnant   Headache    Migraines   Heart murmur    ? heart murmur in past   History of blood transfusion 2013   Hypertension    Metabolic syndrome      Home Medications Prior to Admission medications   Medication Sig Start Date End Date Taking? Authorizing Provider  omeprazole (PRILOSEC) 20 MG capsule Take 1 capsule (20 mg total) by mouth daily. 06/25/23  Yes Alka Falwell, MD  ALPRAZolam (XANAX) 0.5 MG tablet alprazolam 0.5 mg tablet  TAKE 1 TABLET BY MOUTH THREE TIMES A DAY AS NEEDED FOR ANXIETY    [provider]  buPROPion  (WELLBUTRIN XL) 300 MG 24 hr tablet Take 300 mg by mouth every morning. 08/29/20   [provider]  busPIRone (BUSPAR) 15 MG tablet TAKE 1 TABLET BY MOUTH TWICE A DAY 10/29/22   Saguier, Ramon Dredge, PA-C  ezetimibe (ZETIA) 10 MG tablet TAKE 1 TABLET BY MOUTH EVERY DAY 12/07/22   Saguier, Ramon Dredge, PA-C  fluticasone [facility]) 50 MCG/ACT nasal spray Place 2 sprays into both nostrils daily. 05/31/23   Margaretann Loveless, PA-C  Insulin Pen Needle (B-D ULTRAFINE III SHORT PEN) 31G X 8 MM MISC USE AS DIRECTED TO INJECT SAXENDA 06/23/22   Saguier, Ramon Dredge, PA-C  metFORMIN (GLUCOPHAGE) 500 MG tablet TAKE 1 TABLET BY MOUTH TWICE A DAY WITH FOOD 06/16/23   Saguier, Ramon Dredge, PA-C  SAXENDA 18 MG/3ML SOPN INJECT 3 MG INTO THE SKIN DAILY. 07/19/22   Saguier, Ramon Dredge, PA-C  Turmeric 500 MG CAPS Take 1 capsule by mouth 2 (two) times daily.    [provider]  VITAMIN D PO Take 1,000 Units by mouth 2 (two) times daily.    [provider]  zolpidem (AMBIEN) 5 MG tablet Take 5 mg by mouth at bedtime as needed for sleep.    [provider]      Allergies    Azithromycin  Review of Systems   Review of Systems  Constitutional:  Negative for fever.  Respiratory:  Negative for cough and shortness of breath.   Cardiovascular:  Positive for chest pain. Negative for palpitations, orthopnea and leg swelling.  Gastrointestinal:  Positive for heartburn. Negative for abdominal pain and diarrhea.  Neurological:  Negative for weakness.  All other systems reviewed and are negative.   Physical Exam Updated Vital Signs BP (!) 158/98   Pulse 79   Temp 98.1 F (36.7 C) (Oral)   Resp 18   Ht 5\' 2"  (1.575 m)   Wt 78.9 kg   LMP 02/11/2016 (Exact Date)   SpO2 97%   BMI 31.83 kg/m  Physical Exam Vitals and nursing note reviewed.  Constitutional:      General: She is not in acute distress.    Appearance: Normal appearance. She is well-developed.  HENT:     Head: Normocephalic and atraumatic.      Nose: Nose normal.  Eyes:     Pupils: Pupils are equal, round, and reactive to light.  Cardiovascular:     Rate and Rhythm: Normal rate and regular rhythm.     Pulses: Normal pulses.     Heart sounds: Normal heart sounds.  Pulmonary:     Effort: Pulmonary effort is normal. No respiratory distress.     Breath sounds: Normal breath sounds.  Abdominal:     General: Bowel sounds are normal. There is no distension.     Palpations: Abdomen is soft. There is no mass.     Tenderness: There is no abdominal tenderness. There is no guarding or rebound. Negative signs include Rovsing's sign and McBurney's sign.     Hernia: No hernia is present.  Genitourinary:    Vagina: No vaginal discharge.  Musculoskeletal:        General: Normal range of motion.     Cervical back: Normal range of motion and neck supple.  Skin:    General: Skin is warm and dry.     Capillary Refill: Capillary refill takes less than 2 seconds.     Findings: No erythema or rash.  Neurological:     General: No focal deficit present.     Mental Status: She is alert and oriented to person, place, and time.     Deep Tendon Reflexes: Reflexes normal.  Psychiatric:        Mood and Affect: Mood normal.     ED Results / Procedures / Treatments   Labs (all labs ordered are listed, but only abnormal results are displayed) Results for orders placed or performed during the hospital encounter of 06/25/23  Basic metabolic panel  Result Value Ref Range   Sodium 138 135 - 145 mmol/L   Potassium 3.9 3.5 - 5.1 mmol/L   Chloride 103 98 - 111 mmol/L   CO2 25 22 - 32 mmol/L   Glucose, Bld 135 (H) 70 - 99 mg/dL   BUN 9 6 - 20 mg/dL   Creatinine, Ser 1.61 0.44 - 1.00 mg/dL   Calcium 9.2 8.9 - 09.6 mg/dL   GFR, Estimated >04 >54 mL/min   Anion gap 10 5 - 15  CBC  Result Value Ref Range   WBC 13.9 (H) 4.0 - 10.5 K/uL   RBC 4.17 3.87 - 5.11 MIL/uL   Hemoglobin 13.0 12.0 - 15.0 g/dL   HCT 09.8 11.9 - 14.7 %   MCV 93.3 80.0 -  100.0 fL   MCH 31.2 26.0 - 34.0 pg  MCHC 33.4 30.0 - 36.0 g/dL   RDW 40.9 81.1 - 91.4 %   Platelets 517 (H) 150 - 400 K/uL   nRBC 0.0 0.0 - 0.2 %  Troponin I (High Sensitivity)  Result Value Ref Range   Troponin I (High Sensitivity) 2 <18 ng/L  Troponin I (High Sensitivity)  Result Value Ref Range   Troponin I (High Sensitivity) 2 <18 ng/L   DG Chest 2 View  Result Date: 06/25/2023 CLINICAL DATA:  Chest pain. EXAM: CHEST - 2 VIEW COMPARISON:  PA and lateral 09/22/2020 FINDINGS: The heart size and mediastinal contours are within normal limits. Both lungs are clear. The visualized skeletal structures are unremarkable apart from again noted multilevel mid/lower cervical spine fusion plating. IMPRESSION: No active cardiopulmonary disease.  Stable chest. Electronically Signed   By: Almira Bar M.D.   On: 06/25/2023 02:43     EKG EKG Interpretation Date/Time:  Saturday June 25 2023 02:11:00 EDT Ventricular Rate:  92 PR Interval:  159 QRS Duration:  82 QT Interval:  334 QTC Calculation: 414 R Axis:   72  Text Interpretation: Sinus rhythm Confirmed by Ewa Hipp ([id number]) on 06/25/2023 2:15:50 AM  Radiology DG Chest 2 View  Result Date: 06/25/2023 CLINICAL DATA:  Chest pain. EXAM: CHEST - 2 VIEW COMPARISON:  PA and lateral 09/22/2020 FINDINGS: The heart size and mediastinal contours are within normal limits. Both lungs are clear. The visualized skeletal structures are unremarkable apart from again noted multilevel mid/lower cervical spine fusion plating. IMPRESSION: No active cardiopulmonary disease.  Stable chest. Electronically Signed   By: Almira Bar M.D.   On: 06/25/2023 02:43    Procedures Procedures    Medications Ordered in ED Medications  sucralfate (CARAFATE) 1 GM/10ML suspension 1 g (1 g Oral Given 06/25/23 0331)  alum & mag hydroxide-simeth (MAALOX/MYLANTA) 200-200-20 MG/5ML suspension 30 mL (30 mLs Oral Given 06/25/23 0331)  ketorolac (TORADOL) injection 60 mg  (60 mg Intramuscular Given 06/25/23 0446)    ED Course/ Medical Decision Making/ A&P                             Medical Decision Making Patient has had heartburn this week but tonight had SSCP that radiated around and through to the back she is on antibiotics for her sinuses.    Problems Addressed: Primary hypertension: chronic illness or injury    Details: Follow up with your PMD  Amount and/or Complexity of Data Reviewed External Data Reviewed: labs and notes.    Details: Previous notes reviewed platelets reviewed for more than 1 years and are always elevated  Labs: ordered.    Details: White count slight elevation 13.9 (on antibiotics for sinus infection), normal hemoglobin 13, elevated platelets 517 (this is chronic based on previous CBCs0 2 negative troponins 2/2 Radiology: ordered and independent interpretation performed.    Details: Xray normal by me  ECG/medicine tests: ordered and independent interpretation performed. Decision-making details documented in ED Course.  Risk OTC drugs. Prescription drug management. Risk Details: Patient is very well appearing.  I suspect this is GERD vs esophageal spasm vs. Gastritis especially given current antibiotics for sinus infection.  I do not believe this is biliary colic as there is no RUQ pain on exam and pain is in the chest and B.  I do not believe this is a PE given nature of the symptoms and lack of SOB.  No travel no OCP.  Ruled out for MI  Heart score is 2 low risk for MACE.  Ruled out for MI in the ED.  Stabel for discharge.  Have advised GERD friendly diet.  Stable for discharge.  Strict return.      Final Clinical Impression(s) / ED Diagnoses Final diagnoses:  Precordial pain   Return for intractable cough, coughing up blood, fevers > 100.4 unrelieved by medication, shortness of breath, intractable vomiting, chest pain, shortness of breath, weakness, numbness, changes in speech, facial asymmetry, abdominal pain, passing out,  Inability to tolerate liquids or food, cough, altered mental status or any concerns. No signs of systemic illness or infection. The patient is nontoxic-appearing on exam and vital signs are within normal limits.  I have reviewed the triage vital signs and the nursing notes. Pertinent labs & imaging results that were available during my care of the patient were reviewed by me and considered in my medical decision making (see chart for details). After history, exam, and medical workup I feel the patient has been appropriately medically screened and is safe for discharge home. Pertinent diagnoses were discussed with the patient. Patient was given return precautions.  Rx / DC Orders ED Discharge Orders          Ordered    omeprazole (PRILOSEC) 20 MG capsule  Daily        06/25/23 0438              Brunette Lavalle, MD 06/25/23 7253

## 2023-07-05 ENCOUNTER — Ambulatory Visit: Payer: BC Managed Care – PPO | Attending: Neurosurgery

## 2023-07-05 DIAGNOSIS — M25611 Stiffness of right shoulder, not elsewhere classified: Secondary | ICD-10-CM | POA: Diagnosis not present

## 2023-07-05 DIAGNOSIS — M542 Cervicalgia: Secondary | ICD-10-CM | POA: Insufficient documentation

## 2023-07-05 DIAGNOSIS — R293 Abnormal posture: Secondary | ICD-10-CM | POA: Diagnosis not present

## 2023-07-05 NOTE — Therapy (Signed)
OUTPATIENT PHYSICAL THERAPY CERVICAL TREATMENT   Patient Name: Ann Frederick MRN: [id number] DOB:09-03-1971, 52 y.o., female Today's Date: 07/05/2023  END OF SESSION:  PT End of Session - 07/05/23 1455     Visit Number 5    Date for PT Re-Evaluation 07/28/23    Progress Note Due on Visit 10    PT Start Time 1445    PT Stop Time 1530    PT Time Calculation (min) 45 min    Activity Tolerance Patient tolerated treatment well    Behavior During Therapy WFL for tasks assessed/performed               Past Medical History:  Diagnosis Date   Acne    Anemia    history of   Anxiety    Arthritis    Depression    GERD (gastroesophageal reflux disease)    worse while pregnant   Headache    Migraines   Heart murmur    ? heart murmur in past   History of blood transfusion 2013   Hypertension    Metabolic syndrome    Past Surgical History:  Procedure Laterality Date   ANTERIOR FUSION CERVICAL SPINE  2018   CESAREAN SECTION     x 2   CESAREAN SECTION  09/29/2012   Procedure: CESAREAN SECTION;  Surgeon: Turner Daniels, MD;  Location: [facility];  Service: Obstetrics;  Laterality: N/A;  Repeat edc 10/12/12/REQUEST;Chassity,Dee,Colleen   COLONOSCOPY     ESOPHAGOGASTRODUODENOSCOPY  normal    7/12   JOINT REPLACEMENT Bilateral    LAPAROSCOPIC VAGINAL HYSTERECTOMY WITH SALPINGECTOMY Bilateral 02/23/2016   Procedure: LAPAROSCOPIC ASSISTED VAGINAL HYSTERECTOMY WITH bilateral SALPINGECTOMY, right oophorectomy. laporotic repair of incidental cystotomy.;  Surgeon: Candice Camp, MD;  Location: [facility];  Service: Gynecology;  Laterality: Bilateral;   MOUTH SURGERY     Patient Active Problem List   Diagnosis Date Noted   Left hip pain 08/27/2019   Class 1 obesity due to excess calories without serious comorbidity with body mass index (BMI) of 34.0 to 34.9 in adult 11/28/2018   Heart palpitations 11/28/2018   OSA (obstructive sleep apnea) 09/14/2018   Neck pain 04/26/2018   Wellness  examination 03/08/2016   S/P laparoscopic assisted vaginal hysterectomy (LAVH) 02/23/2016   Pink eye 06/19/2015   Pain of right thumb 06/19/2015   Sinusitis, acute frontal 02/05/2015   Headache around the eyes 02/05/2015   Back pain 11/01/2014   Routine general medical examination at a health care facility 10/30/2013   Vesicular rash 09/07/2013   Sinusitis 02/02/2013   Viral pharyngitis 03/03/2012   Flank pain 08/13/2011   Inguinal adenopathy 08/13/2011   Dysphagia 04/20/2011   GERD 01/29/2011   FATIGUE 01/29/2011   SCIATICA, BILATERAL 06/18/2010   LEG PAIN, BILATERAL 06/06/2010   HIP PAIN, LEFT 04/01/2010   MASS, LOCALIZED, SUPERFICIAL 03/05/2009   ACNE VULGARIS 07/11/2007   MURMUR 07/11/2007    PCP: Carolin Guernsey PROVIDER: Donalee Citrin  REFERRING DIAG: cervicalgia  THERAPY DIAG:  Stiffness of right shoulder, not elsewhere classified  Cervicalgia  Abnormal posture  Rationale for Evaluation and Treatment: Rehabilitation  ONSET DATE: 03/16/23  SUBJECTIVE:  SUBJECTIVE STATEMENT: pt reports improved pain, therapy has been helping so far.  Eval: 6 weeks ago L sided stiff neck occiput to L upper traps, really painful, locked up eventually, then switched to R side, developed numbness dorsal hands, fingers.  Took naproxen which worked, flexiril works as well now sore and tingling R C 7/7 area, hand and forearm, no specific pattern noted . Sometimes interferes with sleep, wakes her up, difficulty going to sleep  Hand dominance: Right  PERTINENT HISTORY:  H/o cervical fusion C 3 to C7 2018, also hip replacements R 2023  PAIN:  Are you having pain? Yes: NPRS scale: 2/10 Pain location: R upper traps region Pain description: soreness Aggravating factors: no specific  pattern Relieving factors: meds somewhat  PRECAUTIONS: None  WEIGHT BEARING RESTRICTIONS: No  FALLS:  Has patient fallen in last 6 months? 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  OBJECTIVE:   DIAGNOSTIC FINDINGS:  Per neurologist's notes some spinal stenosis above and below surgical sites, not encroaching spinal cord  PATIENT SURVEYS:  FOTO 51 neck  COGNITION: Overall cognitive status: Within functional limits for tasks assessed  SENSATION: Reports tingling/ aching along radial nerve pathway, C7.  No sensory deficits noted  POSTURE: in standing R iliac crest elevated approx. 2 cm, R scapula and shoulder depressed. Mild concavity R lumbar region, mild R lat shift pelvis.  Increased thoracic kyphosis noted mid thoracic T 4 to T 8 region  PALPATION: Point tender R upper traps,R  supraspinatus, R infraspinatus,decreased PA mobility noted R post ribs as compared to L  CERVICAL ROM:   Active ROM A/PROM (deg) eval AROM 07/05/23  Flexion 50 WFL  Extension 50 WFL  Right lateral flexion nt   Left lateral flexion nt   Right rotation 45 50% limited  Left rotation 45 mildly limited- 58   (Blank rows = not tested)  UPPER EXTREMITY ROM: All UE ROM wfl, discomfort/"soreness" noted by patient R side transitioning from R shoulder abduction to rest position and with IR behind back   UPPER EXTREMITY MMT:  MMT Right eval Left eval  Shoulder flexion 5 5  Shoulder extension 5 5  Shoulder abduction 5 5  Shoulder adduction 5 5  Shoulder extension 5 5  Shoulder internal rotation 4 4  Shoulder external rotation 5 5  Middle trapezius 4 4  Lower trapezius 3+ 4-  Elbow flexion 5 5  Elbow extension 5 5   (Blank rows = not tested)   TODAY'S TREATMENT:                                                                                                                               DATE: 07/05/23 UBE L2.0 3 min fwd/ 3 min back  Cervcial ROM assessment Seated external rotation with RTB 2x10 Seated horizontal ABD RTB 2x10 Standing alternate diagonals RTB x 10  Standing IR with RTB x 10 bil S/L open book 10x5" bil Cobra press up 5x5"  STM to Bil UT, LS more focused on R side  06/21/23:  Manual:  Prone for manual techniques as follows:  Trigger Point Dry-Needling  Treatment instructions: Expect mild to moderate muscle soreness. S/S of pneumothorax if dry needled over a lung field, and to seek immediate medical attention should they occur. Patient verbalized understanding of these instructions and education. Patient Consent Given: Yes Education handout provided: Previously provided Muscles treated: B upper traps, 2 pts each, R infraspinatus Electrical stimulation performed: No Parameters: N/A Treatment response/outcome: Twitch Response Elicited  Theragun for B quads, vastus lateralis musculature, the patient also utilized to determine whether she might benefit from one for home use  Prone for PA stabilization of R post hip jt with ant hip/ quads stretch  Therex:  the patient was able to demonstrate her previously instructed ex including :  needed some cues on technique but otherwise able to perform well from memory Red t band B shoulder ER Red t band B shoulder horiz abd Red t band diagonals, Red t band shoulder IR 06/07/23:Manual:  Prone for PA stabilization R gr trochanter and manual stretching of R ant hip jt, quads, hip flexors, 45 sec holds, 3 reps Trigger Point Dry-Needling  Treatment instructions: Expect mild to moderate muscle soreness. S/S of pneumothorax if dry needled over a lung field, and to seek immediate medical attention should they occur. Patient verbalized understanding of these instructions and education. Patient Consent Given: Yes Education  handout provided: No Muscles treated: R upper traps, R infraspinatus, 2 pts each Electrical stimulation performed: No Parameters: N/A Treatment response/outcome: Twitch Response Elicited and Palpable Increase in Muscle Length Therapeutic exercise: The patient was instructed/ reviewed the following ex: 6"Foam roller for pecs stretch and B shoulder flexion, static and dynamic stretching Prone B quads myofascial release with foam roller Red t band B shoulder ER Red t band B shoulder horiz abd Red t band diagonals, Red t band shoulder IR Also inst in seated quads and IT band self myofascial release with massage stick and foam roller.   05/18/23 UBE L2 x 6 min 34fwd/3bwd Foam roller: back, glutes, lat glutes, HS, quads and gluteals 18 Seated ER RTB x 12 Seated horizontal ABD x 12 Standing diagonals x 10 ea RTB RTB IR/ER in door x 10 ea Manual Therapy: to decrease muscle spasm and pain and improve mobility Skilled palpation and monitoring of soft tissues during DN STM to R UT and IS Trigger Point Dry-Needling  Treatment instructions: Expect mild to moderate muscle soreness. S/S of pneumothorax if dry needled over a lung field, and to seek immediate medical attention should they occur. Patient verbalized understanding of these instructions and education. Patient Consent Given: Yes Education handout provided: No Muscles treated: R upper traps, R infraspinatus Electrical stimulation performed: No Parameters: N/A Treatment response/outcome: Twitch Response Elicited   Instructed in therex as described below, also patient education in evaluating posture at her work station, also elongating L step length and trying to maintain equal stride length B, noted slight shortening L stride length and decreased R hip ext movement terminal stance.  05/05/23  Manual:  prone for gentle PA gr 2 mobs B mid post ribs Trigger Point Dry-Needling  Treatment instructions: Expect mild to moderate muscle soreness.  S/S of pneumothorax if  dry needled over a lung field, and to seek immediate medical attention should they occur. Patient verbalized understanding of these instructions and education. Patient Consent Given: Yes Education handout provided: No Muscles treated: R upper traps, R infraspinatus Electrical stimulation performed: No Parameters: N/A Treatment response/outcome: Twitch Response Elicited   Instructed in therex as described below, also patient education in evaluating posture at her work station, also elongating L step length and trying to maintain equal stride length B, noted slight shortening L stride length and decreased R hip ext movement terminal stance.  PATIENT EDUCATION:  Education details: POC, Goals Person educated: Patient Education method: Explanation, Demonstration, Tactile cues, Verbal cues, and Handouts Education comprehension: verbalized understanding, returned demonstration, and verbal cues required  HOME EXERCISE PROGRAM: Access Code: [email or web address] URL: [email or web address] Date: 05/18/2023 Prepared by: Raynelle Fanning  Exercises - Seated Shoulder Horizontal Abduction with Resistance - Palms Down  - 3 x daily - 7 x weekly - 1 sets - 10 reps - Standing Shoulder External Rotation with Resistance  - 3 x daily - 7 x weekly - 1 sets - 10 reps - Hip Flexor Stretch at Edge of Bed  - 3 x daily - 7 x weekly - 1 sets - 10 reps - Shoulder External Rotation with Anchored Resistance  - 1 x daily - 3-4 x weekly - 3 sets - 10 reps - Shoulder Internal Rotation with Resistance  - 1 x daily - 3-4 x weekly - 3 sets - 10 reps - Standing Shoulder Diagonal Horizontal Abduction 60/120 Degrees with Resistance  - 1 x daily - 3-4 x weekly - 3 sets - 10 reps - Thoracic Mobilization with Hands Behind Head on Foam Roll  - 1 x daily - 7 x weekly - 1 sets - 1 reps - Quadriceps Mobilization with Foam Roll  - 1 x daily - 3-4 x weekly - 1 sets - 10 reps - Gluteus Mobilization with Foam Roll  - 1 x  daily - 3-4 x weekly - 1 sets - 10 reps - Figure 4 Gluteus Mobilization on Foam Roll  - 1 x daily - 3-4 x weekly - 1 sets - 10 reps  ASSESSMENT:  CLINICAL IMPRESSION: Pt shows limited R cervical rotation, L rotation mildly limited, flex and ext is [facility]. Reviewed HEP for postural exercises, pt required postural cues to sit up straight and avoid cervical protraction with ER. Followed exercise with MT to address tightness on R side of the neck, good response from patient.   OBJECTIVE IMPAIRMENTS: decreased ROM, decreased strength, hypomobility, impaired flexibility, impaired UE functional use, and pain.   ACTIVITY LIMITATIONS: lifting, sleeping, and reach over head  PARTICIPATION LIMITATIONS: community activity and yard work  PERSONAL FACTORS: Age, Past/current experiences, Profession, Time since onset of injury/illness/exacerbation, and 1-2 comorbidities: B THA, previous c spine fusion  are also affecting patient's functional outcome.   REHAB POTENTIAL: Good  CLINICAL DECISION MAKING: Stable/uncomplicated  EVALUATION COMPLEXITY: Low   GOALS: Goals reviewed with patient? Yes GOALS: Goals reviewed with patient? Yes  SHORT TERM GOALS: Target date: 05/19/23 Patient will be independent with initial HEP.  Baseline: initiated at eval Goal status: MET    LONG TERM GOALS: Target date: 12 weeks 07/28/23 Patient will be independent with advanced/ongoing HEP to improve outcomes and carryover.  Baseline: initiated at eval Goal status: INITIAL  2.  Patient will report improvement in neck pain to allow for 505 improvement in sleep to improve QOL.  Baseline: multiple sleep interruptions, and difficulty falling asleep Goal  status: INITIAL  3.  Patient will demonstrate increased  cervical ROM for rotation to 60 degrees safety with driving.  Baseline: 45 Goal status: IN PROGRESS- able to check blind spot on L side, has to turn body to check R side 07/05/23  4.  Patient will report 80 on FOTO  neck(patient outcome measure)  to demonstrate improved functional ability.  Baseline: 51 Goal status: INITIAL  5.  Patient will demonstrate improved strength B shoulder IR, horizontal abd, and lower traps to 5/5 Baseline: 3+ to 4/5 Goal status: IN PROGRESS   PLAN:  PT FREQUENCY: 1-2x/week  PT DURATION: 12 weeks  PLANNED INTERVENTIONS: Therapeutic exercises, Therapeutic activity, Neuromuscular re-education, Balance training, Gait training, Patient/Family education, Self Care, Joint mobilization, and Manual therapy  PLAN FOR NEXT SESSION: How was dry needling, how was therex.  Continue dry needling, assess and stretch upper cervical spine to improve rotation mobility, mobilize thoracic spine and ribs, reassess strength B shoulders   Deshara Rossi L Bawi Lakins, PTA 07/05/2023, 3:36 PM

## 2023-07-11 ENCOUNTER — Ambulatory Visit: Payer: BC Managed Care – PPO | Admitting: Physical Therapy

## 2023-07-14 ENCOUNTER — Telehealth: Payer: BC Managed Care – PPO | Admitting: Nurse Practitioner

## 2023-07-14 DIAGNOSIS — H109 Unspecified conjunctivitis: Secondary | ICD-10-CM

## 2023-07-15 MED ORDER — OFLOXACIN 0.3 % OP SOLN
1.0000 [drp] | Freq: Four times a day (QID) | OPHTHALMIC | 0 refills | Status: AC
Start: 2023-07-15 — End: 2023-07-20

## 2023-07-15 NOTE — Progress Notes (Signed)
E-Visit for Pink Eye   We are sorry that you are not feeling well.  Here is how we plan to help!  Based on what you have shared with me it looks like you have conjunctivitis.  Conjunctivitis is a common inflammatory or infectious condition of the eye that is often referred to as "pink eye".  In most cases it is contagious (viral or bacterial). However, not all conjunctivitis requires antibiotics (ex. Allergic).  We have made appropriate suggestions for you based upon your presentation.  I have prescribed Oflaxacin 1-2 drops 4 times a day times 5 days   Pink eye can be highly contagious.  It is typically spread through direct contact with secretions, or contaminated objects or surfaces that one may have touched.  Strict handwashing is suggested with soap and water is urged.  If not available, use alcohol based had sanitizer.  Avoid unnecessary touching of the eye.  If you wear contact lenses, you will need to refrain from wearing them until you see no white discharge from the eye for at least 24 hours after being on medication.  You should see symptom improvement in 1-2 days after starting the medication regimen.  Call us if symptoms are not improved in 1-2 days.  Home Care: Wash your hands often! Do not wear your contacts until you complete your treatment plan. Avoid sharing towels, bed linen, personal items with a person who has pink eye. See attention for anyone in your home with similar symptoms.  Get Help Right Away If: Your symptoms do not improve. You develop blurred or loss of vision. Your symptoms worsen (increased discharge, pain or redness)   Thank you for choosing an e-visit.  Your e-visit answers were reviewed by a board certified advanced clinical practitioner to complete your personal care plan. Depending upon the condition, your plan could have included both over the counter or prescription medications.  Please review your pharmacy choice. Make sure the pharmacy is open so  you can pick up prescription now. If there is a problem, you may contact your provider through MyChart messaging and have the prescription routed to another pharmacy.  Your safety is important to us. If you have drug allergies check your prescription carefully.   For the next 24 hours you can use MyChart to ask questions about today's visit, request a non-urgent call back, or ask for a work or school excuse. You will get an email in the next two days asking about your experience. I hope that your e-visit has been valuable and will speed your recovery.  Meds ordered this encounter  Medications   ofloxacin (OCUFLOX) 0.3 % ophthalmic solution    Sig: Place 1 drop into both eyes 4 (four) times daily for 5 days.    Dispense:  5 mL    Refill:  0    I spent approximately 5 minutes reviewing the patient's history, current symptoms and coordinating their care today.   

## 2023-07-17 NOTE — Therapy (Signed)
OUTPATIENT PHYSICAL THERAPY CERVICAL TREATMENT   Patient Name: Ann Frederick MRN: [id number] DOB:[birth year], 52 y.o., female Today's Date: 07/18/2023  END OF SESSION:  PT End of Session - 07/18/23 1350     Visit Number 6    Date for PT Re-Evaluation 07/28/23    Progress Note Due on Visit 10    PT Start Time 1350    PT Stop Time 1430    PT Time Calculation (min) 40 min    Activity Tolerance Patient tolerated treatment well    Behavior During Therapy WFL for tasks assessed/performed                Past Medical History:  Diagnosis Date   Acne    Anemia    history of   Anxiety    Arthritis    Depression    GERD (gastroesophageal reflux disease)    worse while pregnant   Headache    Migraines   Heart murmur    ? heart murmur in past   History of blood transfusion 2013   Hypertension    Metabolic syndrome    Past Surgical History:  Procedure Laterality Date   ANTERIOR FUSION CERVICAL SPINE  2018   CESAREAN SECTION     x 2   CESAREAN SECTION  09/29/2012   Procedure: CESAREAN SECTION;  Surgeon: Turner Daniels, MD;  Location: [facility];  Service: Obstetrics;  Laterality: N/A;  Repeat edc 10/12/12/REQUEST;Chassity,Dee,Colleen   COLONOSCOPY     ESOPHAGOGASTRODUODENOSCOPY  normal    7/12   JOINT REPLACEMENT Bilateral    LAPAROSCOPIC VAGINAL HYSTERECTOMY WITH SALPINGECTOMY Bilateral 02/23/2016   Procedure: LAPAROSCOPIC ASSISTED VAGINAL HYSTERECTOMY WITH bilateral SALPINGECTOMY, right oophorectomy. laporotic repair of incidental cystotomy.;  Surgeon: Candice Camp, MD;  Location: [facility];  Service: Gynecology;  Laterality: Bilateral;   MOUTH SURGERY     Patient Active Problem List   Diagnosis Date Noted   Left hip pain 08/27/2019   Class 1 obesity due to excess calories without serious comorbidity with body mass index (BMI) of 34.0 to 34.9 in adult 11/28/2018   Heart palpitations 11/28/2018   OSA (obstructive sleep apnea) 09/14/2018   Neck pain 04/26/2018   Wellness  examination 03/08/2016   S/P laparoscopic assisted vaginal hysterectomy (LAVH) 02/23/2016   Pink eye 06/19/2015   Pain of right thumb 06/19/2015   Sinusitis, acute frontal 02/05/2015   Headache around the eyes 02/05/2015   Back pain 11/01/2014   Routine general medical examination at a health care facility 10/30/2013   Vesicular rash 09/07/2013   Sinusitis 02/02/2013   Viral pharyngitis 03/03/2012   Flank pain 08/13/2011   Inguinal adenopathy 08/13/2011   Dysphagia 04/20/2011   GERD 01/29/2011   FATIGUE 01/29/2011   SCIATICA, BILATERAL 06/18/2010   LEG PAIN, BILATERAL 06/06/2010   HIP PAIN, LEFT 04/01/2010   MASS, LOCALIZED, SUPERFICIAL 03/05/2009   ACNE VULGARIS 07/11/2007   MURMUR 07/11/2007    PCP: Carolin Guernsey PROVIDER: Donalee Citrin  REFERRING DIAG: cervicalgia  THERAPY DIAG:  Stiffness of right shoulder, not elsewhere classified  Cervicalgia  Abnormal posture  Rationale for Evaluation and Treatment: Rehabilitation  ONSET DATE: 03/16/23  SUBJECTIVE:  SUBJECTIVE STATEMENT: It's more soreness. The needling definitely helps.Symptoms have resolved in the hand/fingers.   Eval: 6 weeks ago L sided stiff neck occiput to L upper traps, really painful, locked up eventually, then switched to R side, developed numbness dorsal hands, fingers.  Took naproxen which worked, flexiril works as well now sore and tingling R C 7/7 area, hand and forearm, no specific pattern noted . Sometimes interferes with sleep, wakes her up, difficulty going to sleep  Hand dominance: Right  PERTINENT HISTORY:  H/o cervical fusion C 3 to C7 2018, also hip replacements R 2023  PAIN:  Are you having pain? Yes: NPRS scale: 2/10 Pain location: R upper traps region Pain description:  soreness Aggravating factors: no specific pattern Relieving factors: meds somewhat  PRECAUTIONS: None  WEIGHT BEARING RESTRICTIONS: No  FALLS:  Has patient fallen in last 6 months? 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  OBJECTIVE:   DIAGNOSTIC FINDINGS:  Per neurologist's notes some spinal stenosis above and below surgical sites, not encroaching spinal cord  PATIENT SURVEYS:  FOTO 51 neck  COGNITION: Overall cognitive status: Within functional limits for tasks assessed  SENSATION: Reports tingling/ aching along radial nerve pathway, C7.  No sensory deficits noted  POSTURE: in standing R iliac crest elevated approx. 2 cm, R scapula and shoulder depressed. Mild concavity R lumbar region, mild R lat shift pelvis.  Increased thoracic kyphosis noted mid thoracic T 4 to T 8 region  PALPATION: Point tender R upper traps,R  supraspinatus, R infraspinatus,decreased PA mobility noted R post ribs as compared to L  CERVICAL ROM:   Active ROM A/PROM (deg) eval AROM 07/05/23 AROM 07/18/23  Flexion 50 WFL   Extension 50 WFL   Right lateral flexion nt    Left lateral flexion nt    Right rotation 45 50% limited 43  Left rotation 45 mildly limited- 58 50   (Blank rows = not tested)  UPPER EXTREMITY ROM: All UE ROM wfl, discomfort/"soreness" noted by patient R side transitioning from R shoulder abduction to rest position and with IR behind back   UPPER EXTREMITY MMT:  MMT Right eval Left eval Right 07/18/23 Left 07/18/23  Shoulder flexion 5 5    Shoulder extension 5 5    Shoulder abduction 5 5    Shoulder adduction 5 5    Shoulder extension 5 5    Shoulder internal rotation 4 4 5 5   Shoulder external rotation 5 5    Middle trapezius 4 4 4+ 5  Lower trapezius 3+ 4-  4 4+  Elbow flexion 5 5    Elbow extension 5 5     (Blank rows = not tested)   TODAY'S TREATMENT:  DATE: 07/18/23 UBE L2 X 6 MIN Assessed goals Seated horizontal ABD GTB x 10, then standing 2x10 Standing ER GTB 3 x 10 intermittent rests Standing alternate diagonals GTB 2 x 10  Standing IR with GTB 3 x 10 bil Prone on elbows: neck diagonals x 10 ea elbow to opp shoulder Skilled palpation and monitoring of soft tissues during DN STM to muscles needled; rotational mobs upper cervical Trigger Point Dry-Needling  Treatment instructions: Expect mild to moderate muscle soreness. S/S of pneumothorax if dry needled over a lung field, and to seek immediate medical attention should they occur. Patient verbalized understanding of these instructions and education. Patient Consent Given: Yes Education handout provided: Previously provided Muscles treated: B C2 multifidi, R rectus capitius and UT Electrical stimulation performed: No Parameters: N/A Treatment response/outcome: Twitch Response Elicited and Palpable Increase in Muscle Length    07/05/23 UBE L2.0 3 min fwd/ 3 min back  Cervcial ROM assessment Seated external rotation with RTB 2x10 Seated horizontal ABD RTB 2x10 Standing alternate diagonals RTB x 10  Standing IR with RTB x 10 bil S/L open book 10x5" bil Cobra press up 5x5"  STM to Bil UT, LS more focused on R side  06/21/23:  Manual:  Prone for manual techniques as follows:  Trigger Point Dry-Needling  Treatment instructions: Expect mild to moderate muscle soreness. S/S of pneumothorax if dry needled over a lung field, and to seek immediate medical attention should they occur. Patient verbalized understanding of these instructions and education. Patient Consent Given: Yes Education handout provided: Previously provided Muscles treated: B upper traps, 2  pts each, R infraspinatus Electrical stimulation performed: No Parameters: N/A Treatment response/outcome: Twitch Response Elicited  Theragun for B quads, vastus lateralis musculature, the patient also utilized to determine whether she might benefit from one for home use  Prone for PA stabilization of R post hip jt with ant hip/ quads stretch  Therex:  the patient was able to demonstrate her previously instructed ex including :  needed some cues on technique but otherwise able to perform well from memory Red t band B shoulder ER Red t band B shoulder horiz abd Red t band diagonals, Red t band shoulder IR 06/07/23:Manual:  Prone for PA stabilization R gr trochanter and manual stretching of R ant hip jt, quads, hip flexors, 45 sec holds, 3 reps Trigger Point Dry-Needling  Treatment instructions: Expect mild to moderate muscle soreness. S/S of pneumothorax if dry needled over a lung field, and to seek immediate medical attention should they occur. Patient verbalized understanding of these instructions and education. Patient Consent Given: Yes Education handout provided: No Muscles treated: R upper traps, R infraspinatus, 2 pts each Electrical stimulation performed: No Parameters: N/A Treatment response/outcome: Twitch Response Elicited and Palpable Increase in Muscle Length Therapeutic exercise: The patient was instructed/ reviewed the following ex: 6"Foam roller for pecs stretch and B shoulder flexion, static and dynamic stretching Prone B quads myofascial release with foam roller Red t band B shoulder ER Red t band B shoulder horiz abd Red t band diagonals, Red t band shoulder IR Also inst in seated quads and IT band self myofascial release with massage stick and foam roller.    PATIENT EDUCATION:  Education details: POC, Goals Person educated: Patient Education method: Explanation, Demonstration, Tactile cues, Verbal cues, and Handouts Education comprehension: verbalized  understanding, returned demonstration, and verbal cues required  HOME EXERCISE PROGRAM: Access Code: [email or web address] URL: [email or web address] Date: 05/18/2023 Prepared by: Raynelle Fanning  Exercises - Seated Shoulder  Horizontal Abduction with Resistance - Palms Down  - 3 x daily - 7 x weekly - 1 sets - 10 reps - Standing Shoulder External Rotation with Resistance  - 3 x daily - 7 x weekly - 1 sets - 10 reps - Hip Flexor Stretch at Edge of Bed  - 3 x daily - 7 x weekly - 1 sets - 10 reps - Shoulder External Rotation with Anchored Resistance  - 1 x daily - 3-4 x weekly - 3 sets - 10 reps - Shoulder Internal Rotation with Resistance  - 1 x daily - 3-4 x weekly - 3 sets - 10 reps - Standing Shoulder Diagonal Horizontal Abduction 60/120 Degrees with Resistance  - 1 x daily - 3-4 x weekly - 3 sets - 10 reps - Thoracic Mobilization with Hands Behind Head on Foam Roll  - 1 x daily - 7 x weekly - 1 sets - 1 reps - Quadriceps Mobilization with Foam Roll  - 1 x daily - 3-4 x weekly - 1 sets - 10 reps - Gluteus Mobilization with Foam Roll  - 1 x daily - 3-4 x weekly - 1 sets - 10 reps - Figure 4 Gluteus Mobilization on Foam Roll  - 1 x daily - 3-4 x weekly - 1 sets - 10 reps  ASSESSMENT:  CLINICAL IMPRESSION: Ann Frederick is progressing with her LTGs. Sleep goal has been met. She is getting stronger in her shoulder and upper back. Progressed HEP to [facility] today. She was stiff in upper cervical spine with mobs and may benefit from ongoing treatment here. Airlie continues to demonstrate potential for improvement and would benefit from continued skilled therapy to address remaining impairments.    OBJECTIVE IMPAIRMENTS: decreased ROM, decreased strength, hypomobility, impaired flexibility, impaired UE functional use, and pain.   ACTIVITY LIMITATIONS: lifting, sleeping, and reach over head  PARTICIPATION LIMITATIONS: community activity and yard work  PERSONAL FACTORS: Age, Past/current  experiences, Profession, Time since onset of injury/illness/exacerbation, and 1-2 comorbidities: B THA, previous c spine fusion  are also affecting patient's functional outcome.   REHAB POTENTIAL: Good  CLINICAL DECISION MAKING: Stable/uncomplicated  EVALUATION COMPLEXITY: Low   GOALS: Goals reviewed with patient? Yes GOALS: Goals reviewed with patient? Yes  SHORT TERM GOALS: Target date: 05/19/23 Patient will be independent with initial HEP.  Baseline: initiated at eval Goal status: MET    LONG TERM GOALS: Target date: 12 weeks 07/28/23  Patient will be independent with advanced/ongoing HEP to improve outcomes and carryover.  Baseline: initiated at eval Goal status: IN PROGRESS  2.  Patient will report improvement in neck pain to allow for 50% improvement in sleep to improve QOL.  Baseline: multiple sleep interruptions, and difficulty falling asleep Goal status: MET  3.  Patient will demonstrate increased cervical ROM for rotation to 60 degrees safety with driving.  Baseline: 45 Goal status: IN PROGRESS- see objective chart  07/18/23  4.  Patient will report 26 on FOTO neck(patient outcome measure)  to demonstrate improved functional ability.  Baseline: 51 Goal status: IN PROGRESS  07/18/23 54  5.  Patient will demonstrate improved strength B shoulder IR, horizontal abd, and lower traps to 5/5 Baseline: 3+ to 4/5 Goal status: IN PROGRESS see objective chart 07/18/23   PLAN:  PT FREQUENCY: 1-2x/week  PT DURATION: 12 weeks  PLANNED INTERVENTIONS: Therapeutic exercises, Therapeutic activity, Neuromuscular re-education, Balance training, Gait training, Patient/Family education, Self Care, Joint mobilization, and Manual therapy  PLAN FOR NEXT SESSION: Assess  dry needling, assess and stretch upper cervical spine to improve rotation mobility, mobilize thoracic spine and ribs,  Solon Palm, PT 07/18/2023, 4:34 PM

## 2023-07-18 ENCOUNTER — Encounter: Payer: Self-pay | Admitting: Physical Therapy

## 2023-07-18 ENCOUNTER — Ambulatory Visit: Payer: BC Managed Care – PPO | Admitting: Physical Therapy

## 2023-07-18 DIAGNOSIS — M542 Cervicalgia: Secondary | ICD-10-CM

## 2023-07-18 DIAGNOSIS — R293 Abnormal posture: Secondary | ICD-10-CM | POA: Diagnosis not present

## 2023-07-18 DIAGNOSIS — M25611 Stiffness of right shoulder, not elsewhere classified: Secondary | ICD-10-CM

## 2023-07-21 ENCOUNTER — Ambulatory Visit: Payer: BC Managed Care – PPO

## 2023-07-26 ENCOUNTER — Ambulatory Visit: Payer: BC Managed Care – PPO

## 2023-07-28 ENCOUNTER — Ambulatory Visit: Payer: BC Managed Care – PPO | Attending: Neurosurgery

## 2023-08-11 ENCOUNTER — Other Ambulatory Visit: Payer: Self-pay | Admitting: Medical

## 2023-09-12 ENCOUNTER — Other Ambulatory Visit: Payer: Self-pay | Admitting: Medical

## 2023-09-14 ENCOUNTER — Other Ambulatory Visit: Payer: Self-pay | Admitting: Medical

## 2023-09-21 DIAGNOSIS — Z01419 Encounter for gynecological examination (general) (routine) without abnormal findings: Secondary | ICD-10-CM | POA: Diagnosis not present

## 2023-09-21 DIAGNOSIS — Z1231 Encounter for screening mammogram for malignant neoplasm of breast: Secondary | ICD-10-CM | POA: Diagnosis not present

## 2023-09-21 DIAGNOSIS — Z1272 Encounter for screening for malignant neoplasm of vagina: Secondary | ICD-10-CM | POA: Diagnosis not present

## 2023-09-21 DIAGNOSIS — Z6832 Body mass index (BMI) 32.0-32.9, adult: Secondary | ICD-10-CM | POA: Diagnosis not present

## 2023-09-21 DIAGNOSIS — N951 Menopausal and female climacteric states: Secondary | ICD-10-CM | POA: Diagnosis not present

## 2023-09-21 LAB — HM PAP SMEAR: HM Pap smear: NORMAL

## 2023-09-21 LAB — HM MAMMOGRAPHY

## 2023-09-30 ENCOUNTER — Telehealth: Payer: BC Managed Care – PPO | Admitting: Physician Assistant

## 2023-09-30 DIAGNOSIS — B9689 Other specified bacterial agents as the cause of diseases classified elsewhere: Secondary | ICD-10-CM

## 2023-09-30 DIAGNOSIS — H109 Unspecified conjunctivitis: Secondary | ICD-10-CM

## 2023-09-30 MED ORDER — OFLOXACIN 0.3 % OP SOLN
1.0000 [drp] | Freq: Four times a day (QID) | OPHTHALMIC | 0 refills | Status: DC
Start: 2023-09-30 — End: 2023-11-17

## 2023-09-30 NOTE — Progress Notes (Signed)
E-Visit for [employer]   We are sorry that you are not feeling well.  Here is how we plan to help!  Based on what you have shared with me it looks like you have conjunctivitis.  Conjunctivitis is a common inflammatory or infectious condition of the eye that is often referred to as "pink eye".  In most cases it is contagious (viral or bacterial). However, not all conjunctivitis requires antibiotics (ex. Allergic).  We have made appropriate suggestions for you based upon your presentation.  I have prescribed Oflaxacin 1-2 drops 4 times a day times 5 days   Pink eye can be highly contagious.  It is typically spread through direct contact with secretions, or contaminated objects or surfaces that one may have touched.  Strict handwashing is suggested with soap and water is urged.  If not available, use alcohol based had sanitizer.  Avoid unnecessary touching of the eye.  If you wear contact lenses, you will need to refrain from wearing them until you see no white discharge from the eye for at least 24 hours after being on medication.  You should see symptom improvement in 1-2 days after starting the medication regimen.  Call us if symptoms are not improved in 1-2 days.  Home Care: Wash your hands often! Do not wear your contacts until you complete your treatment plan. Avoid sharing towels, bed linen, personal items with a person who has pink eye. See attention for anyone in your home with similar symptoms.  Get Help Right Away If: Your symptoms do not improve. You develop blurred or loss of vision. Your symptoms worsen (increased discharge, pain or redness)   Thank you for choosing an e-visit.  Your e-visit answers were reviewed by a board certified advanced clinical practitioner to complete your personal care plan. Depending upon the condition, your plan could have included both over the counter or prescription medications.  Please review your pharmacy choice. Make sure the pharmacy is open so  you can pick up prescription now. If there is a problem, you may contact your provider through [employer] and have the prescription routed to another pharmacy.  Your safety is important to [place]. If you have drug allergies check your prescription carefully.   For the next 24 hours you can use MyChart to ask questions about today's visit, request a non-urgent call back, or ask for a work or school excuse. You will get an email in the next two days asking about your experience. I hope that your e-visit has been valuable and will speed your recovery.   I have spent 5 minutes in review of e-visit questionnaire, review and updating patient chart, medical decision making and response to patient.   Mar Daring, PA-C

## 2023-10-11 ENCOUNTER — Encounter: Payer: Self-pay | Admitting: Medical

## 2023-10-11 DIAGNOSIS — E6609 Other obesity due to excess calories: Secondary | ICD-10-CM

## 2023-10-11 DIAGNOSIS — E66811 Obesity, class 1: Secondary | ICD-10-CM

## 2023-10-11 MED ORDER — BD PEN NEEDLE SHORT U/F 31G X 8 MM MISC
3 refills | Status: DC
Start: 2023-10-11 — End: 2024-03-22

## 2023-10-25 ENCOUNTER — Telehealth: Payer: BC Managed Care – PPO | Admitting: Physician Assistant

## 2023-10-25 ENCOUNTER — Encounter: Payer: Self-pay | Admitting: Physician Assistant

## 2023-10-25 DIAGNOSIS — J069 Acute upper respiratory infection, unspecified: Secondary | ICD-10-CM | POA: Diagnosis not present

## 2023-10-25 DIAGNOSIS — R110 Nausea: Secondary | ICD-10-CM

## 2023-10-25 MED ORDER — BENZONATATE 100 MG PO CAPS
100.0000 mg | ORAL_CAPSULE | Freq: Three times a day (TID) | ORAL | 0 refills | Status: DC | PRN
Start: 2023-10-25 — End: 2023-11-17

## 2023-10-25 MED ORDER — IPRATROPIUM BROMIDE 0.03 % NA SOLN
2.0000 | Freq: Two times a day (BID) | NASAL | 0 refills | Status: DC
Start: 2023-10-25 — End: 2023-11-17

## 2023-10-25 MED ORDER — AMOXICILLIN-POT CLAVULANATE 875-125 MG PO TABS: 1.0000 | ORAL_TABLET | Freq: Two times a day (BID) | ORAL | 0 refills | Status: DC

## 2023-10-25 MED ORDER — ONDANSETRON 4 MG PO TBDP
4.0000 mg | ORAL_TABLET | Freq: Three times a day (TID) | ORAL | 0 refills | Status: DC | PRN
Start: 2023-10-25 — End: 2024-02-25

## 2023-10-25 MED ORDER — PSEUDOEPH-BROMPHEN-DM 30-2-10 MG/5ML PO SYRP
5.0000 mL | ORAL_SOLUTION | Freq: Four times a day (QID) | ORAL | 0 refills | Status: DC | PRN
Start: 2023-10-25 — End: 2023-11-17

## 2023-10-25 NOTE — Patient Instructions (Signed)
Ann Frederick, thank you for joining Margaretann Loveless, PA-C for today's virtual visit.  While this provider is not your primary care provider (PCP), if your PCP is located in our provider database this encounter information will be shared with them immediately following your visit.   A [facility] MyChart account gives you access to today's visit and all your visits, tests, and labs performed at [facility] " click here if you don't have a [facility] MyChart account or go to [email or web address]  Consent: (Patient) Ann Frederick provided verbal consent for this virtual visit at the beginning of the encounter.  Current Medications:  Current Outpatient Medications:    benzonatate (TESSALON) 100 MG capsule, Take 1 capsule (100 mg total) by mouth 3 (three) times daily as needed., Disp: 30 capsule, Rfl: 0   brompheniramine-pseudoephedrine-DM 30-2-10 MG/5ML syrup, Take 5 mLs by mouth 4 (four) times daily as needed., Disp: 120 mL, Rfl: 0   ipratropium (ATROVENT) 0.03 % nasal spray, Place 2 sprays into both nostrils every 12 (twelve) hours., Disp: 30 mL, Rfl: 0   ondansetron (ZOFRAN-ODT) 4 MG disintegrating tablet, Take 1 tablet (4 mg total) by mouth every 8 (eight) hours as needed., Disp: 20 tablet, Rfl: 0   ALPRAZolam (XANAX) 0.5 MG tablet, alprazolam 0.5 mg tablet  TAKE 1 TABLET BY MOUTH THREE TIMES A DAY AS NEEDED FOR ANXIETY, Disp: , Rfl:    buPROPion (WELLBUTRIN XL) 300 MG 24 hr tablet, Take 300 mg by mouth every morning., Disp: , Rfl:    busPIRone (BUSPAR) 15 MG tablet, TAKE 1 TABLET BY MOUTH TWICE A DAY, Disp: 180 tablet, Rfl: 2   ezetimibe (ZETIA) 10 MG tablet, Take 1 tablet (10 mg total) by mouth daily., Disp: 90 tablet, Rfl: 1   fluticasone (FLONASE) 50 MCG/ACT nasal spray, Place 2 sprays into both nostrils daily., Disp: 16 g, Rfl: 0   Insulin Pen Needle (B-D ULTRAFINE III SHORT PEN) 31G X 8 MM MISC, USE AS DIRECTED TO INJECT SAXENDA, Disp: 100 each, Rfl: 3   metFORMIN  (GLUCOPHAGE) 500 MG tablet, TAKE 1 TABLET BY MOUTH TWICE A DAY WITH FOOD, Disp: 180 tablet, Rfl: 0   ofloxacin (OCUFLOX) 0.3 % ophthalmic solution, Place 1 drop into both eyes 4 (four) times daily. For 5 days, Disp: 5 mL, Rfl: 0   omeprazole (PRILOSEC) 20 MG capsule, Take 1 capsule (20 mg total) by mouth daily., Disp: 30 capsule, Rfl: 0   SAXENDA 18 MG/3ML SOPN, INJECT 3 MG INTO THE SKIN DAILY., Disp: 3 mL, Rfl: 2   Turmeric 500 MG CAPS, Take 1 capsule by mouth 2 (two) times daily., Disp: , Rfl:    VITAMIN D PO, Take 1,000 Units by mouth 2 (two) times daily., Disp: , Rfl:    zolpidem (AMBIEN) 5 MG tablet, Take 5 mg by mouth at bedtime as needed for sleep., Disp: , Rfl:    Medications ordered in this encounter:  Meds ordered this encounter  Medications   ipratropium (ATROVENT) 0.03 % nasal spray    Sig: Place 2 sprays into both nostrils every 12 (twelve) hours.    Dispense:  30 mL    Refill:  0    Order Specific Question:   Supervising Provider    Answer:   Merrilee Jansky [id number]   brompheniramine-pseudoephedrine-DM 30-2-10 MG/5ML syrup    Sig: Take 5 mLs by mouth 4 (four) times daily as needed.    Dispense:  120 mL    Refill:  0  Order Specific Question:   Supervising Provider    Answer:   Merrilee Jansky [[id number]]   benzonatate (TESSALON) 100 MG capsule    Sig: Take 1 capsule (100 mg total) by mouth 3 (three) times daily as needed.    Dispense:  30 capsule    Refill:  0    Order Specific Question:   Supervising Provider    Answer:   LAMPTEY, PHILIP O [[id number]]   ondansetron (ZOFRAN-ODT) 4 MG disintegrating tablet    Sig: Take 1 tablet (4 mg total) by mouth every 8 (eight) hours as needed.    Dispense:  20 tablet    Refill:  0    Order Specific Question:   Supervising Provider    Answer:   Merrilee Jansky [id number]     *If you need refills on other medications prior to your next appointment, please contact your pharmacy*  Follow-Up: Call back or seek an in-person  evaluation if the symptoms worsen or if the condition fails to improve as anticipated.  [facility] [phone]  Other Instructions Upper Respiratory Infection, Adult An upper respiratory infection (URI) is a common viral infection of the nose, throat, and upper air passages that lead to the lungs. The most common type of URI is the common cold. URIs usually get better on their own, without medical treatment. What are the causes? A URI is caused by a virus. You may catch a virus by: Breathing in droplets from an infected person's cough or sneeze. Touching something that has been exposed to the virus (is contaminated) and then touching your mouth, nose, or eyes. What increases the risk? You are more likely to get a URI if: You are very young or very old. You have close contact with others, such as at work, school, or a health care facility. You smoke. You have long-term (chronic) heart or lung disease. You have a weakened disease-fighting system (immune system). You have nasal allergies or asthma. You are experiencing a lot of stress. You have poor nutrition. What are the signs or symptoms? A URI usually involves some of the following symptoms: Runny or stuffy (congested) nose. Cough. Sneezing. Sore throat. Headache. Fatigue. Fever. Loss of appetite. Pain in your forehead, behind your eyes, and over your cheekbones (sinus pain). Muscle aches. Redness or irritation of the eyes. Pressure in the ears or face. How is this diagnosed? This condition may be diagnosed based on your medical history and symptoms, and a physical exam. Your health care provider may use a swab to take a mucus sample from your nose (nasal swab). This sample can be tested to determine what virus is causing the illness. How is this treated? URIs usually get better on their own within 7-10 days. Medicines cannot cure URIs, but your health care provider may recommend certain medicines to help  relieve symptoms, such as: Over-the-counter cold medicines. Cough suppressants. Coughing is a type of defense against infection that helps to clear the respiratory system, so take these medicines only as recommended by your health care provider. Fever-reducing medicines. Follow these instructions at home: Activity Rest as needed. If you have a fever, stay home from work or school until your fever is gone or until your health care provider says your URI cannot spread to other people (is no longer contagious). Your health care provider may have you wear a face mask to prevent your infection from spreading. Relieving symptoms Gargle with a mixture of salt and water 3-4 times a  day or as needed. To make salt water, completely dissolve -1 tsp (3-6 g) of salt in 1 cup (237 mL) of warm water. Use a cool-mist humidifier to add moisture to the air. This can help you breathe more easily. Eating and drinking  Drink enough fluid to keep your urine pale yellow. Eat soups and other clear broths. General instructions  Take over-the-counter and prescription medicines only as told by your health care provider. These include cold medicines, fever reducers, and cough suppressants. Do not use any products that contain nicotine or tobacco. These products include cigarettes, chewing tobacco, and vaping devices, such as e-cigarettes. If you need help quitting, ask your health care provider. Stay away from secondhand smoke. Stay up to date on all immunizations, including the yearly (annual) flu vaccine. Keep all follow-up visits. This is important. How to prevent the spread of infection to others URIs can be contagious. To prevent the infection from spreading: Wash your hands with soap and water for at least 20 seconds. If soap and water are not available, use hand sanitizer. Avoid touching your mouth, face, eyes, or nose. Cough or sneeze into a tissue or your sleeve or elbow instead of into your hand or into the  air.  Contact a health care provider if: You are getting worse instead of better. You have a fever or chills. Your mucus is brown or red. You have yellow or brown discharge coming from your nose. You have pain in your face, especially when you bend forward. You have swollen neck glands. You have pain while swallowing. You have white areas in the back of your throat. Get help right away if: You have shortness of breath that gets worse. You have severe or persistent: Headache. Ear pain. Sinus pain. Chest pain. You have chronic lung disease along with any of the following: Making high-pitched whistling sounds when you breathe, most often when you breathe out (wheezing). Prolonged cough (more than 14 days). Coughing up blood. A change in your usual mucus. You have a stiff neck. You have changes in your: Vision. Hearing. Thinking. Mood. These symptoms may be an emergency. Get help right away. Call 911. Do not wait to see if the symptoms will go away. Do not drive yourself to the hospital. Summary An upper respiratory infection (URI) is a common infection of the nose, throat, and upper air passages that lead to the lungs. A URI is caused by a virus. URIs usually get better on their own within 7-10 days. Medicines cannot cure URIs, but your health care provider may recommend certain medicines to help relieve symptoms. This information is not intended to replace advice given to you by your health care provider. Make sure you discuss any questions you have with your health care provider. Document Revised: 07/15/2021 Document Reviewed: 07/15/2021 Elsevier Patient Education  2024 Elsevier Inc.    If you have been instructed to have an in-person evaluation today at a local Urgent Care facility, please use the link below. It will take you to a list of all of our available [facility], including address, phone number and hours of operation. Please do not delay care.  [facility]  If you or a family member do not have a primary care provider, use the link below to schedule a visit and establish care. When you choose a [facility] primary care physician or advanced practice provider, you gain a long-term partner in health. Find a Primary Care Provider  Learn more  about [facility] in-office and virtual care options:  - Get Care Now

## 2023-10-25 NOTE — Progress Notes (Signed)
Virtual Visit Consent   Ann Frederick, you are scheduled for a virtual visit with a [facility] provider today. Just as with appointments in the office, your consent must be obtained to participate. Your consent will be active for this visit and any virtual visit you may have with one of our providers in the next 365 days. If you have a MyChart account, a copy of this consent can be sent to you electronically.  As this is a virtual visit, video technology does not allow for your provider to perform a traditional examination. This may limit your provider's ability to fully assess your condition. If your provider identifies any concerns that need to be evaluated in person or the need to arrange testing (such as labs, EKG, etc.), we will make arrangements to do so. Although advances in technology are sophisticated, we cannot ensure that it will always work on either your end or our end. If the connection with a video visit is poor, the visit may have to be switched to a telephone visit. With either a video or telephone visit, we are not always able to ensure that we have a secure connection.  By engaging in this virtual visit, you consent to the provision of healthcare and authorize for your insurance to be billed (if applicable) for the services provided during this visit. Depending on your insurance coverage, you may receive a charge related to this service.  I need to obtain your verbal consent now. Are you willing to proceed with your visit today? DARRIS GAMBLER has provided verbal consent on 10/25/2023 for a virtual visit (video or telephone). Margaretann Loveless, PA-C  Date: 10/25/2023 8:54 AM  Virtual Visit via Video Note   I, Margaretann Loveless, connected with  Ann Frederick  ([id number], Apr 05, 1971) on 10/25/23 at  8:45 AM EDT by a video-enabled telemedicine application and verified that I am speaking with the correct person using two identifiers.  Location: Patient: Virtual Visit Location  Patient: Home Provider: Virtual Visit Location Provider: Home Office   I discussed the limitations of evaluation and management by telemedicine and the availability of in person appointments. The patient expressed understanding and agreed to proceed.    History of Present Illness: Ann Frederick is a 52 y.o. who identifies as a female who was assigned female at birth, and is being seen today for URI symptoms.  HPI: URI  This is a new problem. The current episode started yesterday. The problem has been gradually worsening. There has been no fever. Associated symptoms include congestion, coughing (dry), headaches, nausea, rhinorrhea, sinus pain and a sore throat. Pertinent negatives include no chest pain, diarrhea, ear pain, plugged ear sensation, vomiting or wheezing. Associated symptoms comments: Post nasal drainage, chills, body aches, general malaise. She has tried antihistamine (robitussin) for the symptoms. The treatment provided no relief.      Problems:  Patient Active Problem List   Diagnosis Date Noted   Left hip pain 08/27/2019   Class 1 obesity due to excess calories without serious comorbidity with body mass index (BMI) of 34.0 to 34.9 in adult 11/28/2018   Heart palpitations 11/28/2018   OSA (obstructive sleep apnea) 09/14/2018   Neck pain 04/26/2018   Wellness examination 03/08/2016   S/P laparoscopic assisted vaginal hysterectomy (LAVH) 02/23/2016   Pink eye 06/19/2015   Pain of right thumb 06/19/2015   Sinusitis, acute frontal 02/05/2015   Headache around the eyes 02/05/2015   Back pain 11/01/2014   Routine general  medical examination at a health care facility 10/30/2013   Vesicular rash 09/07/2013   Sinusitis 02/02/2013   Viral pharyngitis 03/03/2012   Flank pain 08/13/2011   Inguinal adenopathy 08/13/2011   Dysphagia 04/20/2011   GERD 01/29/2011   FATIGUE 01/29/2011   SCIATICA, BILATERAL 06/18/2010   LEG PAIN, BILATERAL 06/06/2010   HIP PAIN, LEFT 04/01/2010    MASS, LOCALIZED, SUPERFICIAL 03/05/2009   ACNE VULGARIS 07/11/2007   MURMUR 07/11/2007    Allergies:  Allergies  Allergen Reactions   Azithromycin Nausea And Vomiting    diarrhea   Medications:  Current Outpatient Medications:    benzonatate (TESSALON) 100 MG capsule, Take 1 capsule (100 mg total) by mouth 3 (three) times daily as needed., Disp: 30 capsule, Rfl: 0   brompheniramine-pseudoephedrine-DM 30-2-10 MG/5ML syrup, Take 5 mLs by mouth 4 (four) times daily as needed., Disp: 120 mL, Rfl: 0   ipratropium (ATROVENT) 0.03 % nasal spray, Place 2 sprays into both nostrils every 12 (twelve) hours., Disp: 30 mL, Rfl: 0   ondansetron (ZOFRAN-ODT) 4 MG disintegrating tablet, Take 1 tablet (4 mg total) by mouth every 8 (eight) hours as needed., Disp: 20 tablet, Rfl: 0   ALPRAZolam (XANAX) 0.5 MG tablet, alprazolam 0.5 mg tablet  TAKE 1 TABLET BY MOUTH THREE TIMES A DAY AS NEEDED FOR ANXIETY, Disp: , Rfl:    buPROPion (WELLBUTRIN XL) 300 MG 24 hr tablet, Take 300 mg by mouth every morning., Disp: , Rfl:    busPIRone (BUSPAR) 15 MG tablet, TAKE 1 TABLET BY MOUTH TWICE A DAY, Disp: 180 tablet, Rfl: 2   ezetimibe (ZETIA) 10 MG tablet, Take 1 tablet (10 mg total) by mouth daily., Disp: 90 tablet, Rfl: 1   fluticasone (FLONASE) 50 MCG/ACT nasal spray, Place 2 sprays into both nostrils daily., Disp: 16 g, Rfl: 0   Insulin Pen Needle (B-D ULTRAFINE III SHORT PEN) 31G X 8 MM MISC, USE AS DIRECTED TO INJECT SAXENDA, Disp: 100 each, Rfl: 3   metFORMIN (GLUCOPHAGE) 500 MG tablet, TAKE 1 TABLET BY MOUTH TWICE A DAY WITH FOOD, Disp: 180 tablet, Rfl: 0   ofloxacin (OCUFLOX) 0.3 % ophthalmic solution, Place 1 drop into both eyes 4 (four) times daily. For 5 days, Disp: 5 mL, Rfl: 0   omeprazole (PRILOSEC) 20 MG capsule, Take 1 capsule (20 mg total) by mouth daily., Disp: 30 capsule, Rfl: 0   SAXENDA 18 MG/3ML SOPN, INJECT 3 MG INTO THE SKIN DAILY., Disp: 3 mL, Rfl: 2   Turmeric 500 MG CAPS, Take 1 capsule by  mouth 2 (two) times daily., Disp: , Rfl:    VITAMIN D PO, Take 1,000 Units by mouth 2 (two) times daily., Disp: , Rfl:    zolpidem (AMBIEN) 5 MG tablet, Take 5 mg by mouth at bedtime as needed for sleep., Disp: , Rfl:   Observations/Objective: Patient is well-developed, well-nourished in no acute distress.  Resting comfortably at home.  Head is normocephalic, atraumatic.  No labored breathing.  Speech is clear and coherent with logical content.  Patient is alert and oriented at baseline.    Assessment and Plan: 1. Viral URI with cough - ipratropium (ATROVENT) 0.03 % nasal spray; Place 2 sprays into both nostrils every 12 (twelve) hours.  Dispense: 30 mL; Refill: 0 - brompheniramine-pseudoephedrine-DM 30-2-10 MG/5ML syrup; Take 5 mLs by mouth 4 (four) times daily as needed.  Dispense: 120 mL; Refill: 0 - benzonatate (TESSALON) 100 MG capsule; Take 1 capsule (100 mg total) by mouth 3 (three) times  daily as needed.  Dispense: 30 capsule; Refill: 0  2. Nausea - ondansetron (ZOFRAN-ODT) 4 MG disintegrating tablet; Take 1 tablet (4 mg total) by mouth every 8 (eight) hours as needed.  Dispense: 20 tablet; Refill: 0  - Suspect viral URI; advised can take at home Covid 19/Flu test if desired - Symptomatic medications of choice over the counter as needed - Bromfed DM, Tessalon perles for cough - Ipratropium bromide for drainage and congestion - Zofran for nausea - Push fluids - Rest - Seek further evaluation if symptoms change or worsen   Follow Up Instructions: I discussed the assessment and treatment plan with the patient. The patient was provided an opportunity to ask questions and all were answered. The patient agreed with the plan and demonstrated an understanding of the instructions.  A copy of instructions were sent to the patient via MyChart unless otherwise noted below.    The patient was advised to call back or seek an in-person evaluation if the symptoms worsen or if the  condition fails to improve as anticipated.    Margaretann Loveless, PA-C

## 2023-11-15 ENCOUNTER — Encounter: Payer: BC Managed Care – PPO | Admitting: Medical

## 2023-11-17 ENCOUNTER — Encounter: Payer: Self-pay | Admitting: Medical

## 2023-11-17 ENCOUNTER — Ambulatory Visit: Payer: BC Managed Care – PPO | Admitting: Medical

## 2023-11-17 VITALS — BP 138/70 | HR 72 | Temp 98.0°F | Resp 18 | Ht 62.0 in | Wt 169.0 lb

## 2023-11-17 DIAGNOSIS — Z0000 Encounter for general adult medical examination without abnormal findings: Secondary | ICD-10-CM

## 2023-11-17 DIAGNOSIS — I10 Essential (primary) hypertension: Secondary | ICD-10-CM

## 2023-11-17 DIAGNOSIS — E66811 Obesity, class 1: Secondary | ICD-10-CM | POA: Diagnosis not present

## 2023-11-17 DIAGNOSIS — R944 Abnormal results of kidney function studies: Secondary | ICD-10-CM | POA: Diagnosis not present

## 2023-11-17 DIAGNOSIS — R739 Hyperglycemia, unspecified: Secondary | ICD-10-CM

## 2023-11-17 DIAGNOSIS — E6609 Other obesity due to excess calories: Secondary | ICD-10-CM

## 2023-11-17 DIAGNOSIS — E785 Hyperlipidemia, unspecified: Secondary | ICD-10-CM | POA: Diagnosis not present

## 2023-11-17 DIAGNOSIS — Z6834 Body mass index (BMI) 34.0-34.9, adult: Secondary | ICD-10-CM

## 2023-11-17 LAB — CBC WITH DIFFERENTIAL/PLATELET
Basophils Absolute: 0.1 10*3/uL (ref 0.0–0.1)
Basophils Relative: 1.1 % (ref 0.0–3.0)
Eosinophils Absolute: 0.2 10*3/uL (ref 0.0–0.7)
Eosinophils Relative: 2.4 % (ref 0.0–5.0)
HCT: 40.6 % (ref 36.0–46.0)
Hemoglobin: 13.4 g/dL (ref 12.0–15.0)
Lymphocytes Relative: 29.8 % (ref 12.0–46.0)
Lymphs Abs: 2.7 10*3/uL (ref 0.7–4.0)
MCHC: 33 g/dL (ref 30.0–36.0)
MCV: 94.8 fL (ref 78.0–100.0)
Monocytes Absolute: 0.4 10*3/uL (ref 0.1–1.0)
Monocytes Relative: 4.2 % (ref 3.0–12.0)
Neutro Abs: 5.6 10*3/uL (ref 1.4–7.7)
Neutrophils Relative %: 62.5 % (ref 43.0–77.0)
Platelets: 564 10*3/uL — ABNORMAL HIGH (ref 150.0–400.0)
RBC: 4.28 Mil/uL (ref 3.87–5.11)
RDW: 13.4 % (ref 11.5–15.5)
WBC: 9 10*3/uL (ref 4.0–10.5)

## 2023-11-17 LAB — COMPREHENSIVE METABOLIC PANEL
ALT: 10 U/L (ref 0–35)
AST: 14 U/L (ref 0–37)
Albumin: 4.4 g/dL (ref 3.5–5.2)
Alkaline Phosphatase: 62 U/L (ref 39–117)
BUN: 7 mg/dL (ref 6–23)
CO2: 29 meq/L (ref 19–32)
Calcium: 9.9 mg/dL (ref 8.4–10.5)
Chloride: 103 meq/L (ref 96–112)
Creatinine, Ser: 0.93 mg/dL (ref 0.40–1.20)
GFR: 70.86 mL/min (ref >60.00)
Glucose, Bld: 77 mg/dL (ref 70–99)
Potassium: 4.4 meq/L (ref 3.5–5.1)
Sodium: 140 meq/L (ref 135–145)
Total Bilirubin: 0.6 mg/dL (ref 0.2–1.2)
Total Protein: 6.9 g/dL (ref 6.0–8.3)

## 2023-11-17 LAB — LIPID PANEL
Cholesterol: 258 mg/dL — ABNORMAL HIGH (ref 0–200)
HDL: 49 mg/dL (ref >39.00)
LDL Cholesterol: 183 mg/dL — ABNORMAL HIGH (ref 0–99)
NonHDL: 208.9
Total CHOL/HDL Ratio: 5
Triglycerides: 132 mg/dL (ref 0.0–149.0)
VLDL: 26.4 mg/dL (ref 0.0–40.0)

## 2023-11-17 LAB — HEMOGLOBIN A1C: Hgb A1c MFr Bld: 5.2 % (ref 4.6–6.5)

## 2023-11-17 NOTE — Patient Instructions (Addendum)
For you wellness exam today I have ordered cbc, cmp and lipid panel.  Vaccine up to date.  Up to date on mammogram and pap smear.  Recommend exercise and healthy diet.  We will let you know lab results as they come in.  Follow up date appointment will be determined after lab review.     Class 1 obesity due to excess calories without serious comorbidity with body mass index (BMI) of 34.0 to 34.9 in adult -well follow A1c then decide whether to rx wegovy vs ozempic.  Decreased GFR - Comp Met (CMET)  Hyperlipidemia, unspecified hyperlipidemia type -off zetia and well see how you have done without med. - Lipid panel   Htn- -after lab review will decide on bp med. On disussion may prescribe losartan 25 mg daily and not diuretic.    Preventive Care 36-73 Years Old, Female Preventive care refers to lifestyle choices and visits with your health care provider that can promote health and wellness. Preventive care visits are also called wellness exams. What can I expect for my preventive care visit? Counseling Your health care provider may ask you questions about your: Medical history, including: Past medical problems. Family medical history. Pregnancy history. Current health, including: Menstrual cycle. Method of birth control. Emotional well-being. Home life and relationship well-being. Sexual activity and sexual health. Lifestyle, including: Alcohol, nicotine or tobacco, and drug use. Access to firearms. Diet, exercise, and sleep habits. Work and work Astronomer. Sunscreen use. Safety issues such as seatbelt and bike helmet use. Physical exam Your health care provider will check your: Height and weight. These may be used to calculate your BMI (body mass index). BMI is a measurement that tells if you are at a healthy weight. Waist circumference. This measures the distance around your waistline. This measurement also tells if you are at a healthy weight and may help  predict your risk of certain diseases, such as type 2 diabetes and high blood pressure. Heart rate and blood pressure. Body temperature. Skin for abnormal spots. What immunizations do I need?  Vaccines are usually given at various ages, according to a schedule. Your health care provider will recommend vaccines for you based on your age, medical history, and lifestyle or other factors, such as travel or where you work. What tests do I need? Screening Your health care provider may recommend screening tests for certain conditions. This may include: Lipid and cholesterol levels. Diabetes screening. This is done by checking your blood sugar (glucose) after you have not eaten for a while (fasting). Pelvic exam and Pap test. Hepatitis B test. Hepatitis C test. HIV (human immunodeficiency virus) test. STI (sexually transmitted infection) testing, if you are at risk. Lung cancer screening. Colorectal cancer screening. Mammogram. Talk with your health care provider about when you should start having regular mammograms. This may depend on whether you have a family history of breast cancer. BRCA-related cancer screening. This may be done if you have a family history of breast, ovarian, tubal, or peritoneal cancers. Bone density scan. This is done to screen for osteoporosis. Talk with your health care provider about your test results, treatment options, and if necessary, the need for more tests. Follow these instructions at home: Eating and drinking  Eat a diet that includes fresh fruits and vegetables, whole grains, lean protein, and low-fat dairy products. Take vitamin and mineral supplements as recommended by your health care provider. Do not drink alcohol if: Your health care provider tells you not to drink. You are pregnant, may  be pregnant, or are planning to become pregnant. If you drink alcohol: Limit how much you have to 0-1 drink a day. Know how much alcohol is in your drink. In the  [place] one drink equals one 12 oz bottle of beer (355 mL), one 5 oz glass of wine (148 mL), or one 1 oz glass of hard liquor (44 mL). Lifestyle Brush your teeth every morning and night with fluoride toothpaste. Floss one time each day. Exercise for at least 30 minutes 5 or more days each week. Do not use any products that contain nicotine or tobacco. These products include cigarettes, chewing tobacco, and vaping devices, such as e-cigarettes. If you need help quitting, ask your health care provider. Do not use drugs. If you are sexually active, practice safe sex. Use a condom or other form of protection to prevent STIs. If you do not wish to become pregnant, use a form of birth control. If you plan to become pregnant, see your health care provider for a prepregnancy visit. Take aspirin only as told by your health care provider. Make sure that you understand how much to take and what form to take. Work with your health care provider to find out whether it is safe and beneficial for you to take aspirin daily. Find healthy ways to manage stress, such as: Meditation, yoga, or listening to music. Journaling. Talking to a trusted person. Spending time with friends and family. Minimize exposure to UV radiation to reduce your risk of skin cancer. Safety Always wear your seat belt while driving or riding in a vehicle. Do not drive: If you have been drinking alcohol. Do not ride with someone who has been drinking. When you are tired or distracted. While texting. If you have been using any mind-altering substances or drugs. Wear a helmet and other protective equipment during sports activities. If you have firearms in your house, make sure you follow all gun safety procedures. Seek help if you have been physically or sexually abused. What's next? Visit your health care provider once a year for an annual wellness visit. Ask your health care provider how often you should have your eyes and teeth  checked. Stay up to date on all vaccines. This information is not intended to replace advice given to you by your health care provider. Make sure you discuss any questions you have with your health care provider. Document Revised: 06/10/2021 Document Reviewed: 06/10/2021 Elsevier Patient Education  2024 Elsevier Inc.     Elevated blood sugar - Hemoglobin A1c  Advise low sugar diet.

## 2023-11-17 NOTE — Progress Notes (Signed)
Subjective:    Patient ID: Ann Frederick, female    DOB: September 15, 1971, 52 y.o.   MRN: [id number]  HPI  Not exercising much. She states she snacks a lot at night and not sleeping much. About 4-6 hours at night. She states always goes to sleep very late.   Up to date on mammogram an pap.  Up to date on vaccines flu and covid.   Pt has last weight with Saxenda. About 6-7 lbs. She admits to forgets to inject daily. She also does not want to take med daily. Pt wants to try wegovy now. No hx of pancreatitis and no hx of thyroid cancer personally or in her family. Pt also on metformin.  Htn- pt is no longer chlorthalidone. She discontinued that recently.  Hyperlipidemia- pt recently stopped her zetia about 1.5 month ago.    Review of Systems  Constitutional:  Negative for chills, fatigue and fever.  Respiratory:  Negative for cough, chest tightness, shortness of breath and wheezing.   Cardiovascular:  Negative for chest pain and palpitations.  Gastrointestinal:  Negative for abdominal pain, anal bleeding and blood in stool.  Genitourinary:  Negative for dyspareunia, enuresis, frequency and genital sores.  Musculoskeletal:  Negative for back pain and myalgias.  Neurological:  Negative for facial asymmetry and light-headedness.  Hematological:  Negative for adenopathy. Does not bruise/bleed easily.  Psychiatric/Behavioral:  Negative for behavioral problems, confusion and dysphoric mood. The patient is not hyperactive.     Past Medical History:  Diagnosis Date   Acne    Anemia    history of   Anxiety    Arthritis    Depression    GERD (gastroesophageal reflux disease)    worse while pregnant   Headache    Migraines   Heart murmur    ? heart murmur in past   History of blood transfusion 2013   Hypertension    Metabolic syndrome      Social History   Socioeconomic History   Marital status: Married    Spouse name: Not on file   Number of children: 2   Years of education:  Not on file   Highest education level: Master's degree (e.g., MA, MS, MEng, MEd, MSW, MBA)  Occupational History   Not on file  Tobacco Use   Smoking status: Never   Smokeless tobacco: Never  Vaping Use   Vaping status: Never Used  Substance and Sexual Activity   Alcohol use: Yes    Comment: 1 glass of wine per week prior to preg   Drug use: No   Sexual activity: Yes  Other Topics Concern   Not on file  Social History Narrative   Not on file   Social Determinants of Health   Financial Resource Strain: Low Risk  (03/25/2023)   Overall Financial Resource Strain (CARDIA)    Difficulty of Paying Living Expenses: Not hard at all  Food Insecurity: No Food Insecurity (03/25/2023)   Hunger Vital Sign    Worried About Running Out of Food in the Last Year: Never true    Ran Out of Food in the Last Year: Never true  Transportation Needs: No Transportation Needs (03/25/2023)   PRAPARE - Administrator, Civil Service (Medical): No    Lack of Transportation (Non-Medical): No  Physical Activity: Insufficiently Active (03/25/2023)   Exercise Vital Sign    Days of Exercise per Week: 3 days    Minutes of Exercise per Session: 30 min  Stress:  Stress Concern Present (03/25/2023)   [employer] of Occupational Health - Occupational Stress Questionnaire    Feeling of Stress : Rather much  Social Connections: Socially Integrated (03/25/2023)   Social Connection and Isolation Panel [NHANES]    Frequency of Communication with Friends and Family: More than three times a week    Frequency of Social Gatherings with Friends and Family: Once a week    Attends Religious Services: More than 4 times per year    Active Member of Clubs or Organizations: Yes    Attends Engineer, structural: More than 4 times per year    Marital Status: Married  Catering manager Violence: Not on file    Past Surgical History:  Procedure Laterality Date   ANTERIOR FUSION CERVICAL SPINE  2018    CESAREAN SECTION     x 2   CESAREAN SECTION  09/29/2012   Procedure: CESAREAN SECTION;  Surgeon: Turner Daniels, MD;  Location: [facility];  Service: Obstetrics;  Laterality: N/A;  Repeat edc 10/12/12/REQUEST;Chassity,Dee,Colleen   COLONOSCOPY     ESOPHAGOGASTRODUODENOSCOPY  normal    7/12   JOINT REPLACEMENT Bilateral    LAPAROSCOPIC VAGINAL HYSTERECTOMY WITH SALPINGECTOMY Bilateral 02/23/2016   Procedure: LAPAROSCOPIC ASSISTED VAGINAL HYSTERECTOMY WITH bilateral SALPINGECTOMY, right oophorectomy. laporotic repair of incidental cystotomy.;  Surgeon: Candice Camp, MD;  Location: [facility];  Service: Gynecology;  Laterality: Bilateral;   MOUTH SURGERY      Family History  Problem Relation Age of Onset   Depression Mother    Hypertension Mother    Diabetes Mother    Obesity Mother    Thyroid disease Mother    Cancer Father        ? lung CA   Kidney disease Father    Hydrocephalus Sister    Colon cancer Neg Hx    Esophageal cancer Neg Hx    Liver cancer Neg Hx    Pancreatic cancer Neg Hx    Rectal cancer Neg Hx    Stomach cancer Neg Hx     Allergies  Allergen Reactions   Azithromycin Nausea And Vomiting    diarrhea    Current Outpatient Medications on File Prior to Visit  Medication Sig Dispense Refill   ALPRAZolam (XANAX) 0.5 MG tablet alprazolam 0.5 mg tablet  TAKE 1 TABLET BY MOUTH THREE TIMES A DAY AS NEEDED FOR ANXIETY     buPROPion (WELLBUTRIN XL) 300 MG 24 hr tablet Take 300 mg by mouth every morning.     busPIRone (BUSPAR) 15 MG tablet TAKE 1 TABLET BY MOUTH TWICE A DAY 180 tablet 2   ezetimibe (ZETIA) 10 MG tablet Take 1 tablet (10 mg total) by mouth daily. 90 tablet 1   Insulin Pen Needle (B-D ULTRAFINE III SHORT PEN) 31G X 8 MM MISC USE AS DIRECTED TO INJECT SAXENDA 100 each 3   metFORMIN (GLUCOPHAGE) 500 MG tablet TAKE 1 TABLET BY MOUTH TWICE A DAY WITH FOOD 180 tablet 0   omeprazole (PRILOSEC) 20 MG capsule Take 1 capsule (20 mg total) by mouth daily. 30 capsule 0    ondansetron (ZOFRAN-ODT) 4 MG disintegrating tablet Take 1 tablet (4 mg total) by mouth every 8 (eight) hours as needed. 20 tablet 0   SAXENDA 18 MG/3ML SOPN INJECT 3 MG INTO THE SKIN DAILY. 3 mL 2   Turmeric 500 MG CAPS Take 1 capsule by mouth 2 (two) times daily.     VITAMIN D PO Take 1,000 Units by mouth 2 (two) times daily.  zolpidem (AMBIEN) 5 MG tablet Take 5 mg by mouth at bedtime as needed for sleep.     No current facility-administered medications on file prior to visit.    BP 138/70   Pulse 72   Temp 98 F (36.7 C)   Resp 18   Ht 5\' 2"  (1.575 m)   Wt 169 lb (76.7 kg)   LMP 02/11/2016 (Exact Date)   SpO2 98%   BMI 30.91 kg/m        Objective:   Physical Exam   General Mental Status- Alert. General Appearance- Not in acute distress.   Skin General: Color- Normal Color. Moisture- Normal Moisture.  Neck Carotid Arteries- Normal color. Moisture- Normal Moisture. No carotid bruits. No JVD.  Chest and Lung Exam Auscultation: Breath Sounds:-Normal.  Cardiovascular Auscultation:Rythm- Regular. Murmurs & Other Heart Sounds:Auscultation of the heart reveals- No Murmurs.  Abdomen Inspection:-Inspeection Normal. Palpation/Percussion:Note:No mass. Palpation and Percussion of the abdomen reveal- Non Tender, Non Distended + BS, no rebound or guarding.  Neurologic Cranial Nerve exam:- CN III-XII intact(No nystagmus), symmetric smile. Strength:- 5/5 equal and symmetric strength both upper and lower extremities.      Assessment & Plan:  For you wellness exam today I have ordered cbc, cmp and lipid panel.  Vaccine up to date.  Up to date on mammogram and pap smear.  Recommend exercise and healthy diet.  We will let you know lab results as they come in.  Follow up date appointment will be determined after lab review.     Class 1 obesity due to excess calories without serious comorbidity with body mass index (BMI) of 34.0 to 34.9 in adult -well follow A1c  then decide whether to rx wegovy vs ozempic.  Decreased GFR - Comp Met (CMET)  Hyperlipidemia, unspecified hyperlipidemia type -off zetia and well see how you have done without med. - Lipid panel   Htn- -after lab review will decide on bp med. On disussion may prescribe losartan 25 mg daily and not diuretic.   Esperanza Richters, [place]   [id number] charge as well addressed chronic med prolbmes in addition to wllnees.Marland Kitchen

## 2023-11-19 MED ORDER — SEMAGLUTIDE-WEIGHT MANAGEMENT 0.25 MG/0.5ML ~~LOC~~ SOAJ: 0.2500 mg | SUBCUTANEOUS | 0 refills | Status: DC

## 2023-11-19 MED ORDER — EZETIMIBE 10 MG PO TABS: 10.0000 mg | ORAL_TABLET | Freq: Every day | ORAL | 3 refills | Status: DC

## 2023-11-19 MED ORDER — LOSARTAN POTASSIUM 25 MG PO TABS: 25.0000 mg | ORAL_TABLET | Freq: Every day | ORAL | 3 refills | Status: DC

## 2023-11-19 NOTE — Addendum Note (Signed)
Addended by: Gwenevere Abbot on: 11/19/2023 09:34 PM   Modules accepted: Orders

## 2023-11-19 NOTE — Addendum Note (Signed)
Addended by: Gwenevere Abbot on: 11/19/2023 09:28 PM   Modules accepted: Orders

## 2023-11-21 ENCOUNTER — Telehealth: Payer: Self-pay

## 2023-11-21 NOTE — Telephone Encounter (Signed)
PA approved. Effective 11/21/2023 to 06/18/2024

## 2023-11-21 NOTE — Telephone Encounter (Signed)
PA initiated via Covermymeds; KEY: [id number]. Awaiting determination.

## 2023-11-28 ENCOUNTER — Encounter: Payer: Self-pay | Admitting: Medical Oncology

## 2023-11-28 ENCOUNTER — Inpatient Hospital Stay (HOSPITAL_BASED_OUTPATIENT_CLINIC_OR_DEPARTMENT_OTHER): Payer: BC Managed Care – PPO | Admitting: Medical Oncology

## 2023-11-28 ENCOUNTER — Inpatient Hospital Stay: Payer: BC Managed Care – PPO | Attending: Hematology & Oncology

## 2023-11-28 VITALS — BP 136/75 | HR 82 | Temp 98.2°F | Resp 18 | Ht 62.0 in | Wt 171.8 lb

## 2023-11-28 DIAGNOSIS — D75839 Thrombocytosis, unspecified: Secondary | ICD-10-CM

## 2023-11-28 DIAGNOSIS — D696 Thrombocytopenia, unspecified: Secondary | ICD-10-CM | POA: Insufficient documentation

## 2023-11-28 NOTE — Progress Notes (Signed)
Hematology and Oncology Follow Up Visit  Ann Frederick [id number] Jun 05, 1971 52 y.o. 11/28/2023  Past Medical History:  Diagnosis Date   Acne    Anemia    history of   Anxiety    Arthritis    Depression    GERD (gastroesophageal reflux disease)    worse while pregnant   Headache    Migraines   Heart murmur    ? heart murmur in past   History of blood transfusion 2013   Hypertension    Metabolic syndrome     Principle Diagnosis:  Thrombocytosis   Current Therapy:   Currently being worked up    Interim History:  Ann Frederick is back for follow-up for thrombocytosis. Her last and first visit with our office was on 11/26/2022 with Ann Stanford NP. She had labs completed at the time which showed a ferritin of 27, iron saturation of 18%, creatinine of 1.07, calcium of 10.5, with normal LFTs. LDH was normal at 141.   She reports that she had loss to follow up accidentally due to her schedule. She was recently seen by her PCP who completed labs showing a platelet count of 564 (11/17/2023).   She is not currently taking a daily asa  She denies any history of blood clots. She does not smoke She drinks about 3-4 drinks of ETOH on weekends Nutrition rated as poor. Hydration rated as poor.  Has some chronic mild fatigue No SOB, chest pain, calf pain.  No unintentional weight loss   Wt Readings from Last 3 Encounters:  11/28/23 171 lb 12.8 oz (77.9 kg)  11/17/23 169 lb (76.7 kg)  06/25/23 174 lb (78.9 kg)     Medications:   Current Outpatient Medications:    ALPRAZolam (XANAX) 0.5 MG tablet, alprazolam 0.5 mg tablet  TAKE 1 TABLET BY MOUTH THREE TIMES A DAY AS NEEDED FOR ANXIETY, Disp: , Rfl:    buPROPion (WELLBUTRIN XL) 300 MG 24 hr tablet, Take 300 mg by mouth every morning., Disp: , Rfl:    busPIRone (BUSPAR) 15 MG tablet, TAKE 1 TABLET BY MOUTH TWICE A DAY, Disp: 180 tablet, Rfl: 2   ezetimibe (ZETIA) 10 MG tablet, Take 1 tablet (10 mg total) by mouth daily., Disp: 90  tablet, Rfl: 3   Insulin Pen Needle (B-D ULTRAFINE III SHORT PEN) 31G X 8 MM MISC, USE AS DIRECTED TO INJECT SAXENDA, Disp: 100 each, Rfl: 3   losartan (COZAAR) 25 MG tablet, Take 1 tablet (25 mg total) by mouth daily., Disp: 90 tablet, Rfl: 3   metFORMIN (GLUCOPHAGE) 500 MG tablet, TAKE 1 TABLET BY MOUTH TWICE A DAY WITH FOOD, Disp: 180 tablet, Rfl: 0   omeprazole (PRILOSEC) 20 MG capsule, Take 1 capsule (20 mg total) by mouth daily., Disp: 30 capsule, Rfl: 0   ondansetron (ZOFRAN-ODT) 4 MG disintegrating tablet, Take 1 tablet (4 mg total) by mouth every 8 (eight) hours as needed., Disp: 20 tablet, Rfl: 0   SAXENDA 18 MG/3ML SOPN, INJECT 3 MG INTO THE SKIN DAILY., Disp: 3 mL, Rfl: 2   Turmeric 500 MG CAPS, Take 1 capsule by mouth 2 (two) times daily., Disp: , Rfl:    VITAMIN D PO, Take 1,000 Units by mouth 2 (two) times daily., Disp: , Rfl:    zolpidem (AMBIEN) 5 MG tablet, Take 5 mg by mouth at bedtime as needed for sleep., Disp: , Rfl:    Semaglutide-Weight Management 0.25 MG/0.5ML SOAJ, Inject 0.25 mg into the skin once a week. (  Patient not taking: Reported on 11/28/2023), Disp: 2 mL, Rfl: 0  Allergies:  Allergies  Allergen Reactions   Azithromycin Diarrhea and Nausea And Vomiting    Past Medical History, Surgical history, Social history, and Family History were reviewed and updated.  Review of Systems: As stated above in HPI  Physical Exam:  height is 5\' 2"  (1.575 m) and weight is 171 lb 12.8 oz (77.9 kg). Her oral temperature is 98.2 F (36.8 C). Her blood pressure is 136/75 and her pulse is 82. Her respiration is 18 and oxygen saturation is 100%.   Physical Exam General: NAD Cardiovascular: regular rate and rhythm Pulmonary: clear ant fields Abdomen: soft, nontender, + bowel sounds GU: no suprapubic tenderness Extremities: no edema, no joint deformities Skin: no rashes Neurological: Weakness but otherwise nonfocal   Lab Results  Component Value Date   WBC 9.0 11/17/2023    HGB 13.4 11/17/2023   HCT 40.6 11/17/2023   MCV 94.8 11/17/2023   PLT 564.0 (H) 11/17/2023     Chemistry      Component Value Date/Time   NA 140 11/17/2023 1114   K 4.4 11/17/2023 1114   CL 103 11/17/2023 1114   CO2 29 11/17/2023 1114   BUN 7 11/17/2023 1114   CREATININE 0.93 11/17/2023 1114   CREATININE 1.07 (H) 11/26/2022 1336   CREATININE 0.90 10/27/2020 1019      Component Value Date/Time   CALCIUM 9.9 11/17/2023 1114   ALKPHOS 62 11/17/2023 1114   AST 14 11/17/2023 1114   AST 12 (L) 11/26/2022 1336   ALT 10 11/17/2023 1114   ALT 11 11/26/2022 1336   BILITOT 0.6 11/17/2023 1114   BILITOT 0.6 11/26/2022 1336     No diagnosis found.  Assessment and Plan- Patient is a 52 y.o. female who is followed by our office for Thrombocytopenia.   I have recommended hydration with water and a daily prenatal multivitamin JAK2 pending.  She will start a daily 81 mg asa  Disposition: RTC 2 months APP, labs (CBC w/, CMP, iron, ferritin, folate, LDH)   Ann Jacks PA-C 12/2/202412:54 PM

## 2023-11-29 LAB — MISC LABCORP TEST (SEND OUT): Labcorp test code: 489514

## 2023-12-09 ENCOUNTER — Other Ambulatory Visit: Payer: Self-pay | Admitting: Medical

## 2023-12-10 ENCOUNTER — Other Ambulatory Visit: Payer: Self-pay | Admitting: Medical

## 2023-12-26 ENCOUNTER — Telehealth (INDEPENDENT_AMBULATORY_CARE_PROVIDER_SITE_OTHER): Payer: BC Managed Care – PPO | Admitting: Medical

## 2023-12-26 ENCOUNTER — Encounter: Payer: Self-pay | Admitting: Medical

## 2023-12-26 VITALS — BP 139/78 | HR 90 | Temp 97.1°F

## 2023-12-26 DIAGNOSIS — K219 Gastro-esophageal reflux disease without esophagitis: Secondary | ICD-10-CM | POA: Diagnosis not present

## 2023-12-26 DIAGNOSIS — I10 Essential (primary) hypertension: Secondary | ICD-10-CM

## 2023-12-26 NOTE — Progress Notes (Signed)
   Subjective:    Patient ID: Ann Frederick, female    DOB: 11-28-1971, 52 y.o.   MRN: [id number]  HPI  Virtual Visit via Video Note  I connected with Ann Frederick on 12/26/23 at  9:40 AM EST by a video enabled telemedicine application and verified that I am speaking with the correct person using two identifiers.  Location: Patient: home Provider: office   I discussed the limitations of evaluation and management by telemedicine and the availability of in person appointments. The patient expressed understanding and agreed to proceed.  History of Present Illness: Discussed the use of AI scribe software for clinical note transcription with the patient, who gave verbal consent to proceed.  History of Present Illness   The patient, with a history of hypertension, initiated losartan therapy and subsequently experienced a sensation of a pill stuck in her throat and burning on past thursday. The symptoms began on the first day of losartan use, which coincided with a day of heavy eating. The patient also takes Wegovy, a medication known to slow digestion and potentially exacerbate potential reflux, particularly after large meals. The patient self-medicated with omeprazole, which seemed to alleviate the symptoms over a few days.  The patient has not taken losartan since the initial dose due to concerns about potential side effects. She denies any symptoms of angioedema such as lip swelling, shortness of breath, wheezing, lightheadedness, or rash. The patient's blood pressure readings have been mildly elevated in the past, with the most recent reading being 139/78.  The patient also reports a history of pedal edema remotely but not now. She has not experienced any new or worsening symptoms of this condition recently. The patient plans to monitor her blood pressure at home over the next week and will abstain from losartan during this time.         Observations/Objective: General-no acute distress,  pleasant, oriented. Lungs- on inspection lungs appear unlabored. Neck- no tracheal deviation or jvd on inspection. Neuro- gross motor function appears intact.   Assessment and Plan: Assessment and Plan    Hypertension Patient recently started Losartan and experienced throat discomfort and difficulty swallowing on the same day(now resolved since use of omeprazole on same day). No angioedema picture overall. Patient also took [place] and had a large meal on the same day. Blood pressure mildly elevated today (139/78). Symptoms resolved with omeprazole. -Hold Losartan. -Check blood pressure daily. -Low salt diet. -Use Omeprazole as needed for reflux symptoms. -Review blood pressure readings in 1 week.  Gastroesophageal Reflux Disease (GERD) Patient experienced throat discomfort and difficulty swallowing after starting [facility] and having a large meal. Symptoms improved with Omeprazole. -Continue Omeprazole as needed. -Monitor for recurrent symptoms while off Losartan.   Follow-up in 1 week via video visit to review blood pressure readings and discuss symptoms.        Follow Up Instructions:    I discussed the assessment and treatment plan with the patient. The patient was provided an opportunity to ask questions and all were answered. The patient agreed with the plan and demonstrated an understanding of the instructions.   The patient was advised to call back or seek an in-person evaluation if the symptoms worsen or if the condition fails to improve as anticipated.  I   Esperanza Richters, PA-C   Review of Systems     Objective:   Physical Exam        Assessment & Plan:

## 2023-12-26 NOTE — Patient Instructions (Signed)
Hypertension Patient recently started Losartan and experienced throat discomfort and difficulty swallowing on the same day(now resolved since use of omeprazole on same day). No angioedema picture overall. Patient also took [place] and had a large meal on the same day. Blood pressure mildly elevated today (139/78). Symptoms resolved with omeprazole. -Hold Losartan. -Check blood pressure daily. -Low salt diet. -Use Omeprazole as needed for reflux symptoms. -Review blood pressure readings in 1 week.  Gastroesophageal Reflux Disease (GERD) Patient experienced throat discomfort and difficulty swallowing after starting [facility] and having a large meal. Symptoms improved with Omeprazole. -Continue Omeprazole as needed. -Monitor for recurrent symptoms while off Losartan.   Follow-up in 1 week via video visit to review blood pressure readings and discuss symptoms.

## 2024-01-06 ENCOUNTER — Ambulatory Visit (INDEPENDENT_AMBULATORY_CARE_PROVIDER_SITE_OTHER): Payer: BC Managed Care – PPO | Admitting: Medical

## 2024-01-06 VITALS — BP 137/89 | HR 79 | Resp 18 | Ht 62.0 in | Wt 168.2 lb

## 2024-01-06 DIAGNOSIS — I10 Essential (primary) hypertension: Secondary | ICD-10-CM | POA: Diagnosis not present

## 2024-01-06 DIAGNOSIS — K219 Gastro-esophageal reflux disease without esophagitis: Secondary | ICD-10-CM

## 2024-01-06 NOTE — Progress Notes (Signed)
 Subjective:    Patient ID: Ann Frederick, female    DOB: Apr 28, 1971, 53 y.o.   MRN: [id number]  HPI  Discussed the use of AI scribe software for clinical note transcription with the patient, who gave verbal consent to proceed.  History of Present Illness   The patient, with a history of hypertension and gastroesophageal reflux disease (GERD), presents for a follow-up visit. She reports variable blood pressure readings at home, with some high readings that prompted her to take a diuretic on one occasion. She has been prescribed losartan  for hypertension but has not yet re-started taking it.(see last note)   Pt had states had throat discomfort same day as starting losartan  so I had advised holding due to concern for possible allergy.     On last visit reported a recent episode of severe heartburn following a large meal, which she believes may have been exacerbated by the initiation of Wegovy  or losartan . The heartburn has since improved to a medium intensity only occasionally and is currently controlled with sporadic use of omeprazole .    The patient is considering re-challenging herself with losartan  as she is not convinced it was the cause of her throat discomfort.    She has an EpiPen  at home and states in the event she used losartan  and had allergic reaction then she would use and then go to ED. Pt tells me today she strongly does not think she had reaction to losartan .    She has previously responded well to chlorthalidone  for blood pressure control. She today desires tight control of her bp.        Review of Systems  Constitutional:  Negative for chills, fatigue and fever.  Respiratory:  Negative for cough, chest tightness, shortness of breath and wheezing.   Cardiovascular:  Negative for chest pain and palpitations.  Gastrointestinal:  Negative for abdominal pain.  Musculoskeletal:  Negative for back pain.  Neurological:  Negative for dizziness, seizures, weakness and  headaches.  Hematological:  Negative for adenopathy. Does not bruise/bleed easily.  Psychiatric/Behavioral:  Negative for behavioral problems and decreased concentration.     Past Medical History:  Diagnosis Date   Acne    Anemia    history of   Anxiety    Arthritis    Depression    GERD (gastroesophageal reflux disease)    worse while pregnant   Headache    Migraines   Heart murmur    ? heart murmur in past   History of blood transfusion 2013   Hypertension    Metabolic syndrome      Social History   Socioeconomic History   Marital status: Married    Spouse name: Not on file   Number of children: 2   Years of education: Not on file   Highest education level: Master's degree (e.g., MA, MS, MEng, MEd, MSW, MBA)  Occupational History   Not on file  Tobacco Use   Smoking status: Never   Smokeless tobacco: Never  Vaping Use   Vaping status: Never Used  Substance and Sexual Activity   Alcohol use: Yes    Comment: 1 glass of wine per week prior to preg   Drug use: No   Sexual activity: Yes  Other Topics Concern   Not on file  Social History Narrative   Not on file   Social Drivers of Health   Financial Resource Strain: Low Risk  (01/06/2024)   Overall Financial Resource Strain (CARDIA)    Difficulty of  Paying Living Expenses: Not hard at all  Food Insecurity: No Food Insecurity (01/06/2024)   Hunger Vital Sign    Worried About Running Out of Food in the Last Year: Never true    Ran Out of Food in the Last Year: Never true  Transportation Needs: No Transportation Needs (01/06/2024)   PRAPARE - Administrator, Civil Service (Medical): No    Lack of Transportation (Non-Medical): No  Physical Activity: Insufficiently Active (01/06/2024)   Exercise Vital Sign    Days of Exercise per Week: 2 days    Minutes of Exercise per Session: 30 min  Stress: Stress Concern Present (01/06/2024)   [employer] of Occupational Health - Occupational Stress  Questionnaire    Feeling of Stress : To some extent  Social Connections: Socially Integrated (01/06/2024)   Social Connection and Isolation Panel [NHANES]    Frequency of Communication with Friends and Family: More than three times a week    Frequency of Social Gatherings with Friends and Family: Twice a week    Attends Religious Services: More than 4 times per year    Active Member of Clubs or Organizations: Yes    Attends Engineer, Structural: More than 4 times per year    Marital Status: Married  Catering Manager Violence: Not on file    Past Surgical History:  Procedure Laterality Date   ANTERIOR FUSION CERVICAL SPINE  2018   CESAREAN SECTION     x 2   CESAREAN SECTION  09/29/2012   Procedure: CESAREAN SECTION;  Surgeon: Alm JAYSON Cook, MD;  Location: [facility];  Service: Obstetrics;  Laterality: N/A;  Repeat edc 10/12/12/REQUEST;Chassity,Dee,Colleen   COLONOSCOPY     ESOPHAGOGASTRODUODENOSCOPY  normal    7/12   JOINT REPLACEMENT Bilateral    LAPAROSCOPIC VAGINAL HYSTERECTOMY WITH SALPINGECTOMY Bilateral 02/23/2016   Procedure: LAPAROSCOPIC ASSISTED VAGINAL HYSTERECTOMY WITH bilateral SALPINGECTOMY, right oophorectomy. laporotic repair of incidental cystotomy.;  Surgeon: Alm Cook, MD;  Location: [facility];  Service: Gynecology;  Laterality: Bilateral;   MOUTH SURGERY      Family History  Problem Relation Age of Onset   Depression Mother    Hypertension Mother    Diabetes Mother    Obesity Mother    Thyroid  disease Mother    Cancer Father        ? lung CA   Kidney disease Father    Hydrocephalus Sister    Colon cancer Neg Hx    Esophageal cancer Neg Hx    Liver cancer Neg Hx    Pancreatic cancer Neg Hx    Rectal cancer Neg Hx    Stomach cancer Neg Hx     Allergies  Allergen Reactions   Azithromycin  Diarrhea and Nausea And Vomiting    Current Outpatient Medications on File Prior to Visit  Medication Sig Dispense Refill   ALPRAZolam  (XANAX ) 0.5 MG tablet  alprazolam  0.5 mg tablet  TAKE 1 TABLET BY MOUTH THREE TIMES A DAY AS NEEDED FOR ANXIETY     buPROPion  (WELLBUTRIN  XL) 300 MG 24 hr tablet Take 300 mg by mouth every morning.     busPIRone  (BUSPAR ) 15 MG tablet TAKE 1 TABLET BY MOUTH TWICE A DAY 180 tablet 2   ezetimibe  (ZETIA ) 10 MG tablet Take 1 tablet (10 mg total) by mouth daily. 90 tablet 3   Insulin  Pen Needle (B-D ULTRAFINE III SHORT PEN) 31G X 8 MM MISC USE AS DIRECTED TO INJECT SAXENDA  100 each 3   losartan  (COZAAR )  25 MG tablet Take 1 tablet (25 mg total) by mouth daily. 90 tablet 3   metFORMIN  (GLUCOPHAGE ) 500 MG tablet TAKE 1 TABLET BY MOUTH TWICE A DAY WITH FOOD 180 tablet 0   omeprazole  (PRILOSEC) 20 MG capsule Take 1 capsule (20 mg total) by mouth daily. 30 capsule 0   ondansetron  (ZOFRAN -ODT) 4 MG disintegrating tablet Take 1 tablet (4 mg total) by mouth every 8 (eight) hours as needed. 20 tablet 0   SAXENDA  18 MG/3ML SOPN INJECT 3 MG INTO THE SKIN DAILY. 3 mL 2   Semaglutide -Weight Management (WEGOVY ) 0.25 MG/0.5ML SOAJ INJECT 0.25MG  INTO THE SKIN ONE TIME PER WEEK 0.5 mL 1   Turmeric 500 MG CAPS Take 1 capsule by mouth 2 (two) times daily.     VITAMIN D  PO Take 1,000 Units by mouth 2 (two) times daily.     zolpidem  (AMBIEN ) 5 MG tablet Take 5 mg by mouth at bedtime as needed for sleep.     No current facility-administered medications on file prior to visit.    BP 137/89   Pulse 79   Resp 18   Ht 5' 2 (1.575 m)   Wt 168 lb 3.2 oz (76.3 kg)   LMP 02/11/2016 (Exact Date)   SpO2 99%   BMI 30.76 kg/m        Objective:   Physical Exam  General Mental Status- Alert. General Appearance- Not in acute distress.   Skin General: Color- Normal Color. Moisture- Normal Moisture.  Neck Carotid Arteries- Normal color. Moisture- Normal Moisture. No carotid bruits. No JVD.  Chest and Lung Exam Auscultation: Breath Sounds:-Normal.  Cardiovascular Auscultation:Rythm- Regular. Murmurs & Other Heart Sounds:Auscultation of  the heart reveals- No Murmurs.  Abdomen Inspection:-Inspeection Normal. Palpation/Percussion:Note:No mass. Palpation and Percussion of the abdomen reveal- Non Tender, Non Distended + BS, no rebound or guarding.    Neurologic Cranial Nerve exam:- CN III-XII intact(No nystagmus), symmetric smile. Drift Test:- No drift. Romberg Exam:- Negative.  Heal to Toe Gait exam:-Normal. Finger to Nose:- Normal/Intact Strength:- 5/5 equal and symmetric strength both upper and lower extremities.       Assessment & Plan:   Patient Instructions  Hypertension Elevated blood pressure readings at home, variable compliance with losartan . Patient experienced throat discomfort after first dose of losartan , possibly due to reflux. Patient willing to retry losartan . -Start chlorthalidone  25mg  daily for 1 month. -After 1 month, stop chlorthalidone  and start losartan .(decided to wait rather than make change now) -If any symptoms of angioedema or anaphylaxis occur, patient to use EpiPen  and go to the emergency department. -Check blood pressure at home 1 week after starting losartan  and send update.  Gastroesophageal Reflux Disease (GERD) Patient experienced severe heartburn after a large meal, currently controlled with sporadic use of omeprazole . -Continue healthy diet and sporadic use of omeprazole  as needed.   -Follow-up date to be determined based on blood pressure update.

## 2024-01-06 NOTE — Patient Instructions (Signed)
 Hypertension Elevated blood pressure readings at home, variable compliance with losartan . Patient experienced throat discomfort after first dose of losartan , possibly due to reflux. Patient willing to retry losartan . -Start chlorthalidone  25mg  daily for 1 month. -After 1 month, stop chlorthalidone  and start losartan .(decided to wait rather than make change now) -If any symptoms of angioedema or anaphylaxis occur, patient to use EpiPen  and go to the emergency department. -Check blood pressure at home 1 week after starting losartan  and send update.  Gastroesophageal Reflux Disease (GERD) Patient experienced severe heartburn after a large meal, currently controlled with sporadic use of omeprazole . -Continue healthy diet and sporadic use of omeprazole  as needed.   -Follow-up date to be determined based on blood pressure update.

## 2024-01-31 ENCOUNTER — Encounter: Payer: Self-pay | Admitting: Medical Oncology

## 2024-01-31 ENCOUNTER — Inpatient Hospital Stay (HOSPITAL_BASED_OUTPATIENT_CLINIC_OR_DEPARTMENT_OTHER): Payer: BC Managed Care – PPO | Admitting: Medical Oncology

## 2024-01-31 ENCOUNTER — Inpatient Hospital Stay: Payer: BC Managed Care – PPO | Attending: Medical Oncology

## 2024-01-31 VITALS — BP 117/62 | HR 74 | Temp 97.8°F | Resp 18 | Ht 62.0 in | Wt 166.0 lb

## 2024-01-31 DIAGNOSIS — D75839 Thrombocytosis, unspecified: Secondary | ICD-10-CM | POA: Diagnosis not present

## 2024-01-31 DIAGNOSIS — Z7982 Long term (current) use of aspirin: Secondary | ICD-10-CM | POA: Diagnosis not present

## 2024-01-31 DIAGNOSIS — D696 Thrombocytopenia, unspecified: Secondary | ICD-10-CM | POA: Diagnosis not present

## 2024-01-31 DIAGNOSIS — E611 Iron deficiency: Secondary | ICD-10-CM | POA: Diagnosis not present

## 2024-01-31 DIAGNOSIS — D509 Iron deficiency anemia, unspecified: Secondary | ICD-10-CM | POA: Diagnosis not present

## 2024-01-31 LAB — IRON AND IRON BINDING CAPACITY (CC-WL,HP ONLY)
Iron: 88 ug/dL (ref 28–170)
Saturation Ratios: 26 % (ref 10.4–31.8)
TIBC: 335 ug/dL (ref 250–450)
UIBC: 247 ug/dL (ref 148–442)

## 2024-01-31 LAB — CBC WITH DIFFERENTIAL (CANCER CENTER ONLY)
Abs Immature Granulocytes: 0.03 10*3/uL (ref 0.00–0.07)
Basophils Absolute: 0.1 10*3/uL (ref 0.0–0.1)
Basophils Relative: 1 %
Eosinophils Absolute: 0.2 10*3/uL (ref 0.0–0.5)
Eosinophils Relative: 2 %
HCT: 38.1 % (ref 36.0–46.0)
Hemoglobin: 12.8 g/dL (ref 12.0–15.0)
Immature Granulocytes: 0 %
Lymphocytes Relative: 24 %
Lymphs Abs: 2.6 10*3/uL (ref 0.7–4.0)
MCH: 31.1 pg (ref 26.0–34.0)
MCHC: 33.6 g/dL (ref 30.0–36.0)
MCV: 92.5 fL (ref 80.0–100.0)
Monocytes Absolute: 0.5 10*3/uL (ref 0.1–1.0)
Monocytes Relative: 5 %
Neutro Abs: 7.3 10*3/uL (ref 1.7–7.7)
Neutrophils Relative %: 68 %
Platelet Count: 447 10*3/uL — ABNORMAL HIGH (ref 150–400)
RBC: 4.12 MIL/uL (ref 3.87–5.11)
RDW: 12.7 % (ref 11.5–15.5)
WBC Count: 10.7 10*3/uL — ABNORMAL HIGH (ref 4.0–10.5)
nRBC: 0 % (ref 0.0–0.2)

## 2024-01-31 LAB — CMP (CANCER CENTER ONLY)
ALT: 17 U/L (ref 0–44)
AST: 19 U/L (ref 15–41)
Albumin: 4.2 g/dL (ref 3.5–5.0)
Alkaline Phosphatase: 51 U/L (ref 38–126)
Anion gap: 7 (ref 5–15)
BUN: 10 mg/dL (ref 6–20)
CO2: 30 mmol/L (ref 22–32)
Calcium: 9.3 mg/dL (ref 8.9–10.3)
Chloride: 105 mmol/L (ref 98–111)
Creatinine: 0.99 mg/dL (ref 0.44–1.00)
GFR, Estimated: >60 mL/min (ref >60)
Glucose, Bld: 84 mg/dL (ref 70–99)
Potassium: 3.7 mmol/L (ref 3.5–5.1)
Sodium: 142 mmol/L (ref 135–145)
Total Bilirubin: 0.5 mg/dL (ref 0.0–1.2)
Total Protein: 6.9 g/dL (ref 6.5–8.1)

## 2024-01-31 LAB — FOLATE: Folate: >40 ng/mL (ref >5.9)

## 2024-01-31 LAB — VITAMIN B12: Vitamin B-12: 1386 pg/mL — ABNORMAL HIGH (ref 180–914)

## 2024-01-31 LAB — FERRITIN: Ferritin: 32 ng/mL (ref 11–307)

## 2024-01-31 NOTE — Progress Notes (Signed)
 Hematology and Oncology Follow Up Visit  Ann Frederick [id number] December 29, 1970 53 y.o. 01/31/2024  Past Medical History:  Diagnosis Date   Acne    Anemia    history of   Anxiety    Arthritis    Depression    GERD (gastroesophageal reflux disease)    worse while pregnant   Headache    Migraines   Heart murmur    ? 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 Her last and first visit with our office was on 11/26/2022 with Lauraine Pepper NP. She had labs completed at the time which showed a ferritin of 27, iron saturation of 18%, creatinine of 1.07, calcium  of 10.5, with normal LFTs. LDH was normal at 141. She then had loss to follow up and was found to have a platelet count of 564 (11/17/2023).   At her last visit on 11/28/2023 I recommended a daily prenatal multivitamin along with an 81 mg asa.  Today she reports that she has been doing well. She has been taking her asa and prenatal vitamin as recommended. She is on a GLP-1 medication so appetite is lower but she is making a conscious effort to eat nutritious food when she eats.   She denies any history of blood clots. She does not smoke She drinks about 3-4 drinks of ETOH on weekends Nutrition rated as poor. Hydration rated as poor.  Has some chronic mild fatigue No SOB, chest pain, calf pain.  No unintentional weight loss   Wt Readings from Last 3 Encounters:  01/31/24 166 lb (75.3 kg)  01/06/24 168 lb 3.2 oz (76.3 kg)  11/28/23 171 lb 12.8 oz (77.9 kg)     Medications:   Current Outpatient Medications:    ALPRAZolam  (XANAX ) 0.5 MG tablet, alprazolam  0.5 mg tablet  TAKE 1 TABLET BY MOUTH THREE TIMES A DAY AS NEEDED FOR ANXIETY, Disp: , Rfl:    buPROPion  (WELLBUTRIN  XL) 300 MG 24  hr tablet, Take 300 mg by mouth every morning., Disp: , Rfl:    busPIRone  (BUSPAR ) 15 MG tablet, TAKE 1 TABLET BY MOUTH TWICE A DAY, Disp: 180 tablet, Rfl: 2   ezetimibe  (ZETIA ) 10 MG tablet, Take 1 tablet (10 mg total) by mouth daily., Disp: 90 tablet, Rfl: 3   Insulin  Pen Needle (B-D ULTRAFINE III SHORT PEN) 31G X 8 MM MISC, USE AS DIRECTED TO INJECT SAXENDA , Disp: 100 each, Rfl: 3   metFORMIN  (GLUCOPHAGE ) 500 MG tablet, TAKE 1 TABLET BY MOUTH TWICE A DAY WITH FOOD, Disp: 180 tablet, Rfl: 0   omeprazole  (PRILOSEC) 20 MG capsule, Take 1 capsule (20 mg total) by mouth daily., Disp: 30 capsule, Rfl: 0   ondansetron  (ZOFRAN -ODT) 4 MG disintegrating tablet, Take 1 tablet (4 mg total) by mouth every 8 (eight) hours as needed., Disp: 20 tablet, Rfl: 0   Semaglutide -Weight Management (WEGOVY ) 0.25 MG/0.5ML SOAJ, INJECT 0.25MG  INTO THE SKIN ONE TIME PER WEEK, Disp: 0.5 mL, Rfl: 1   Turmeric 500 MG CAPS, Take 1 capsule by mouth 2 (two) times daily., Disp: , Rfl:    VITAMIN D  PO, Take 1,000 Units by mouth 2 (two) times daily., Disp: , Rfl:    zolpidem  (AMBIEN ) 5 MG  tablet, Take 5 mg by mouth at bedtime as needed for sleep., Disp: , Rfl:   Allergies:  Allergies  Allergen Reactions   Azithromycin  Diarrhea and Nausea And Vomiting    Past Medical History, Surgical history, Social history, and Family History were reviewed and updated.  Review of Systems: As stated above in HPI  Physical Exam:  height is 5' 2 (1.575 m) and weight is 166 lb (75.3 kg). Her oral temperature is 97.8 F (36.6 C). Her blood pressure is 117/62 and her pulse is 74. Her respiration is 18 and oxygen saturation is 100%.   Physical Exam General: NAD Cardiovascular: regular rate and rhythm Pulmonary: clear ant fields Abdomen: soft, nontender, + bowel sounds GU: no suprapubic tenderness Extremities: no edema, no joint deformities Skin: no rashes Neurological: Weakness but otherwise nonfocal   Lab Results  Component Value  Date   WBC 10.7 (H) 01/31/2024   HGB 12.8 01/31/2024   HCT 38.1 01/31/2024   MCV 92.5 01/31/2024   PLT 447 (H) 01/31/2024     Chemistry      Component Value Date/Time   NA 140 11/17/2023 1114   K 4.4 11/17/2023 1114   CL 103 11/17/2023 1114   CO2 29 11/17/2023 1114   BUN 7 11/17/2023 1114   CREATININE 0.93 11/17/2023 1114   CREATININE 1.07 (H) 11/26/2022 1336   CREATININE 0.90 10/27/2020 1019      Component Value Date/Time   CALCIUM  9.9 11/17/2023 1114   ALKPHOS 62 11/17/2023 1114   AST 14 11/17/2023 1114   AST 12 (L) 11/26/2022 1336   ALT 10 11/17/2023 1114   ALT 11 11/26/2022 1336   BILITOT 0.6 11/17/2023 1114   BILITOT 0.6 11/26/2022 1336     Encounter Diagnoses  Name Primary?   Thrombocytosis Yes   Iron deficiency anemia, unspecified iron deficiency anemia type     Assessment and Plan- Patient is a 53 y.o. female who is followed by our office for Thrombocytopenia- JAK 2 negative. She also has concurrent iron deficiency.   CBC shows improvement with her current regimen- we will continue it at this time Continue 81 mg asa and prenatal vitamin.   Disposition: RTC 6 months APP, labs (CBC w/, CMP, iron, ferritin, folate, B12)   Lauraine Dais PA-C 2/4/20259:24 AM

## 2024-02-01 ENCOUNTER — Encounter: Payer: Self-pay | Admitting: Medical Oncology

## 2024-02-23 ENCOUNTER — Other Ambulatory Visit: Payer: Self-pay | Admitting: Medical

## 2024-02-24 ENCOUNTER — Other Ambulatory Visit: Payer: Self-pay

## 2024-02-24 ENCOUNTER — Emergency Department (HOSPITAL_BASED_OUTPATIENT_CLINIC_OR_DEPARTMENT_OTHER)
Admission: EM | Admit: 2024-02-24 | Discharge: 2024-02-25 | Disposition: A | Discharge status: Home / Self Care | Payer: BC Managed Care – PPO | Attending: Emergency Medicine | Admitting: Emergency Medicine

## 2024-02-24 ENCOUNTER — Encounter (HOSPITAL_BASED_OUTPATIENT_CLINIC_OR_DEPARTMENT_OTHER): Payer: Self-pay | Admitting: Urology

## 2024-02-24 DIAGNOSIS — R1011 Right upper quadrant pain: Secondary | ICD-10-CM | POA: Diagnosis not present

## 2024-02-24 DIAGNOSIS — N838 Other noninflammatory disorders of ovary, fallopian tube and broad ligament: Secondary | ICD-10-CM | POA: Insufficient documentation

## 2024-02-24 DIAGNOSIS — R112 Nausea with vomiting, unspecified: Secondary | ICD-10-CM | POA: Diagnosis not present

## 2024-02-24 DIAGNOSIS — N949 Unspecified condition associated with female genital organs and menstrual cycle: Secondary | ICD-10-CM

## 2024-02-24 DIAGNOSIS — I10 Essential (primary) hypertension: Secondary | ICD-10-CM | POA: Insufficient documentation

## 2024-02-24 DIAGNOSIS — Z794 Long term (current) use of insulin: Secondary | ICD-10-CM | POA: Diagnosis not present

## 2024-02-24 DIAGNOSIS — N83201 Unspecified ovarian cyst, right side: Secondary | ICD-10-CM | POA: Diagnosis not present

## 2024-02-24 LAB — CBC
HCT: 40 % (ref 36.0–46.0)
Hemoglobin: 13.5 g/dL (ref 12.0–15.0)
MCH: 31 pg (ref 26.0–34.0)
MCHC: 33.8 g/dL (ref 30.0–36.0)
MCV: 91.7 fL (ref 80.0–100.0)
Platelets: 520 10*3/uL — ABNORMAL HIGH (ref 150–400)
RBC: 4.36 MIL/uL (ref 3.87–5.11)
RDW: 12.9 % (ref 11.5–15.5)
WBC: 12.8 10*3/uL — ABNORMAL HIGH (ref 4.0–10.5)
nRBC: 0 % (ref 0.0–0.2)

## 2024-02-24 LAB — COMPREHENSIVE METABOLIC PANEL
ALT: 18 U/L (ref 0–44)
AST: 20 U/L (ref 15–41)
Albumin: 4 g/dL (ref 3.5–5.0)
Alkaline Phosphatase: 49 U/L (ref 38–126)
Anion gap: 10 (ref 5–15)
BUN: 6 mg/dL (ref 6–20)
CO2: 25 mmol/L (ref 22–32)
Calcium: 9.2 mg/dL (ref 8.9–10.3)
Chloride: 102 mmol/L (ref 98–111)
Creatinine, Ser: 0.8 mg/dL (ref 0.44–1.00)
GFR, Estimated: >60 mL/min (ref >60)
Glucose, Bld: 124 mg/dL — ABNORMAL HIGH (ref 70–99)
Potassium: 3.4 mmol/L — ABNORMAL LOW (ref 3.5–5.1)
Sodium: 137 mmol/L (ref 135–145)
Total Bilirubin: 0.6 mg/dL (ref 0.0–1.2)
Total Protein: 7.1 g/dL (ref 6.5–8.1)

## 2024-02-24 LAB — LIPASE, BLOOD: Lipase: 35 U/L (ref 11–51)

## 2024-02-24 NOTE — ED Triage Notes (Signed)
 RUQ pain that started approx 3 hours ago  Reports vomiting x 2    On wygovi x 6 weeks

## 2024-02-25 ENCOUNTER — Emergency Department (HOSPITAL_BASED_OUTPATIENT_CLINIC_OR_DEPARTMENT_OTHER)

## 2024-02-25 ENCOUNTER — Ambulatory Visit (HOSPITAL_BASED_OUTPATIENT_CLINIC_OR_DEPARTMENT_OTHER)
Admission: RE | Admit: 2024-02-25 | Discharge: 2024-02-25 | Disposition: A | Discharge status: Home / Self Care | Source: Ambulatory Visit | Attending: Emergency Medicine | Admitting: Emergency Medicine

## 2024-02-25 ENCOUNTER — Other Ambulatory Visit (HOSPITAL_BASED_OUTPATIENT_CLINIC_OR_DEPARTMENT_OTHER): Payer: Self-pay | Admitting: Emergency Medicine

## 2024-02-25 ENCOUNTER — Other Ambulatory Visit: Payer: Self-pay

## 2024-02-25 ENCOUNTER — Encounter (HOSPITAL_BASED_OUTPATIENT_CLINIC_OR_DEPARTMENT_OTHER): Payer: Self-pay

## 2024-02-25 ENCOUNTER — Observation Stay (HOSPITAL_BASED_OUTPATIENT_CLINIC_OR_DEPARTMENT_OTHER)
Admission: EM | Admit: 2024-02-25 | Discharge: 2024-02-26 | Disposition: A | Discharge status: Home / Self Care | Attending: Surgery | Admitting: Surgery

## 2024-02-25 ENCOUNTER — Other Ambulatory Visit: Payer: Self-pay | Admitting: General Surgery

## 2024-02-25 DIAGNOSIS — Z79899 Other long term (current) drug therapy: Secondary | ICD-10-CM | POA: Insufficient documentation

## 2024-02-25 DIAGNOSIS — N949 Unspecified condition associated with female genital organs and menstrual cycle: Secondary | ICD-10-CM

## 2024-02-25 DIAGNOSIS — Z7982 Long term (current) use of aspirin: Secondary | ICD-10-CM | POA: Diagnosis not present

## 2024-02-25 DIAGNOSIS — K810 Acute cholecystitis: Principal | ICD-10-CM | POA: Diagnosis present

## 2024-02-25 DIAGNOSIS — Z7984 Long term (current) use of oral hypoglycemic drugs: Secondary | ICD-10-CM | POA: Insufficient documentation

## 2024-02-25 DIAGNOSIS — I10 Essential (primary) hypertension: Secondary | ICD-10-CM | POA: Diagnosis not present

## 2024-02-25 DIAGNOSIS — R112 Nausea with vomiting, unspecified: Secondary | ICD-10-CM | POA: Diagnosis not present

## 2024-02-25 DIAGNOSIS — G4733 Obstructive sleep apnea (adult) (pediatric): Secondary | ICD-10-CM | POA: Diagnosis not present

## 2024-02-25 DIAGNOSIS — R1011 Right upper quadrant pain: Secondary | ICD-10-CM | POA: Diagnosis not present

## 2024-02-25 DIAGNOSIS — K819 Cholecystitis, unspecified: Secondary | ICD-10-CM | POA: Diagnosis present

## 2024-02-25 DIAGNOSIS — K812 Acute cholecystitis with chronic cholecystitis: Secondary | ICD-10-CM | POA: Diagnosis present

## 2024-02-25 DIAGNOSIS — K828 Other specified diseases of gallbladder: Secondary | ICD-10-CM | POA: Diagnosis not present

## 2024-02-25 DIAGNOSIS — K8020 Calculus of gallbladder without cholecystitis without obstruction: Secondary | ICD-10-CM | POA: Diagnosis not present

## 2024-02-25 DIAGNOSIS — Z794 Long term (current) use of insulin: Secondary | ICD-10-CM | POA: Diagnosis not present

## 2024-02-25 DIAGNOSIS — K8012 Calculus of gallbladder with acute and chronic cholecystitis without obstruction: Principal | ICD-10-CM | POA: Insufficient documentation

## 2024-02-25 DIAGNOSIS — R933 Abnormal findings on diagnostic imaging of other parts of digestive tract: Secondary | ICD-10-CM | POA: Diagnosis not present

## 2024-02-25 LAB — CBC WITH DIFFERENTIAL/PLATELET
Abs Immature Granulocytes: 0.02 10*3/uL (ref 0.00–0.07)
Basophils Absolute: 0.1 10*3/uL (ref 0.0–0.1)
Basophils Relative: 1 %
Eosinophils Absolute: 0.2 10*3/uL (ref 0.0–0.5)
Eosinophils Relative: 2 %
HCT: 38.6 % (ref 36.0–46.0)
Hemoglobin: 12.9 g/dL (ref 12.0–15.0)
Immature Granulocytes: 0 %
Lymphocytes Relative: 24 %
Lymphs Abs: 2.9 10*3/uL (ref 0.7–4.0)
MCH: 31.1 pg (ref 26.0–34.0)
MCHC: 33.4 g/dL (ref 30.0–36.0)
MCV: 93 fL (ref 80.0–100.0)
Monocytes Absolute: 0.8 10*3/uL (ref 0.1–1.0)
Monocytes Relative: 7 %
Neutro Abs: 8.1 10*3/uL — ABNORMAL HIGH (ref 1.7–7.7)
Neutrophils Relative %: 66 %
Platelets: 458 10*3/uL — ABNORMAL HIGH (ref 150–400)
RBC: 4.15 MIL/uL (ref 3.87–5.11)
RDW: 13.2 % (ref 11.5–15.5)
WBC: 12 10*3/uL — ABNORMAL HIGH (ref 4.0–10.5)
nRBC: 0 % (ref 0.0–0.2)

## 2024-02-25 LAB — COMPREHENSIVE METABOLIC PANEL
ALT: 16 U/L (ref 0–44)
AST: 17 U/L (ref 15–41)
Albumin: 3.6 g/dL (ref 3.5–5.0)
Alkaline Phosphatase: 48 U/L (ref 38–126)
Anion gap: 5 (ref 5–15)
BUN: 6 mg/dL (ref 6–20)
CO2: 28 mmol/L (ref 22–32)
Calcium: 8.7 mg/dL — ABNORMAL LOW (ref 8.9–10.3)
Chloride: 103 mmol/L (ref 98–111)
Creatinine, Ser: 1.02 mg/dL — ABNORMAL HIGH (ref 0.44–1.00)
GFR, Estimated: >60 mL/min (ref >60)
Glucose, Bld: 85 mg/dL (ref 70–99)
Potassium: 3.4 mmol/L — ABNORMAL LOW (ref 3.5–5.1)
Sodium: 136 mmol/L (ref 135–145)
Total Bilirubin: 1.1 mg/dL (ref 0.0–1.2)
Total Protein: 6.5 g/dL (ref 6.5–8.1)

## 2024-02-25 LAB — LIPASE, BLOOD: Lipase: 27 U/L (ref 11–51)

## 2024-02-25 MED ORDER — METFORMIN HCL 500 MG PO TABS: 500.0000 mg | ORAL_TABLET | Freq: Two times a day (BID) | ORAL | Status: DC

## 2024-02-25 MED ORDER — ONDANSETRON 4 MG PO TBDP: 4.0000 mg | ORAL_TABLET | Freq: Four times a day (QID) | ORAL | Status: DC | PRN

## 2024-02-25 MED ORDER — ALPRAZOLAM 0.5 MG PO TABS: 0.5000 mg | ORAL_TABLET | Freq: Three times a day (TID) | ORAL | Status: DC | PRN

## 2024-02-25 MED ORDER — ENOXAPARIN SODIUM 40 MG/0.4ML IJ SOSY: 40.0000 mg | PREFILLED_SYRINGE | INTRAMUSCULAR | Status: DC

## 2024-02-25 MED ORDER — ONDANSETRON HCL 4 MG/2ML IJ SOLN
4.0000 mg | Freq: Once | INTRAMUSCULAR | Status: AC
  Administered 2024-02-25: 4 mg via INTRAVENOUS
  Filled 2024-02-25: qty 2

## 2024-02-25 MED ORDER — BUSPIRONE HCL 5 MG PO TABS
15.0000 mg | ORAL_TABLET | Freq: Two times a day (BID) | ORAL | Status: DC
  Administered 2024-02-25: 15 mg via ORAL
  Filled 2024-02-25: qty 1

## 2024-02-25 MED ORDER — KCL IN DEXTROSE-NACL 20-5-0.45 MEQ/L-%-% IV SOLN
INTRAVENOUS | Status: DC
  Filled 2024-02-25: qty 1000

## 2024-02-25 MED ORDER — ORAL CARE MOUTH RINSE: 15.0000 mL | OROMUCOSAL | Status: DC | PRN

## 2024-02-25 MED ORDER — MORPHINE SULFATE (PF) 4 MG/ML IV SOLN
4.0000 mg | Freq: Once | INTRAVENOUS | Status: AC
  Administered 2024-02-25: 4 mg via INTRAVENOUS
  Filled 2024-02-25: qty 1

## 2024-02-25 MED ORDER — ENOXAPARIN SODIUM 40 MG/0.4ML IJ SOSY
40.0000 mg | PREFILLED_SYRINGE | INTRAMUSCULAR | Status: DC
  Filled 2024-02-25: qty 0.4

## 2024-02-25 MED ORDER — SIMETHICONE 80 MG PO CHEW: 40.0000 mg | CHEWABLE_TABLET | Freq: Four times a day (QID) | ORAL | Status: DC | PRN

## 2024-02-25 MED ORDER — KETOROLAC TROMETHAMINE 15 MG/ML IJ SOLN
15.0000 mg | Freq: Once | INTRAMUSCULAR | Status: AC
  Administered 2024-02-25: 15 mg via INTRAVENOUS
  Filled 2024-02-25: qty 1

## 2024-02-25 MED ORDER — INSULIN ASPART 100 UNIT/ML IJ SOLN: 0.0000 [IU] | Freq: Three times a day (TID) | INTRAMUSCULAR | Status: DC

## 2024-02-25 MED ORDER — IOHEXOL 300 MG/ML  SOLN
100.0000 mL | Freq: Once | INTRAMUSCULAR | Status: AC | PRN
  Administered 2024-02-25: 100 mL via INTRAVENOUS

## 2024-02-25 MED ORDER — ONDANSETRON 4 MG PO TBDP: 4.0000 mg | ORAL_TABLET | Freq: Three times a day (TID) | ORAL | 0 refills | Status: AC | PRN

## 2024-02-25 MED ORDER — MORPHINE SULFATE (PF) 2 MG/ML IV SOLN: 2.0000 mg | INTRAVENOUS | Status: DC | PRN

## 2024-02-25 MED ORDER — SODIUM CHLORIDE 0.9 % IV SOLN: 2.0000 g | INTRAVENOUS | Status: DC

## 2024-02-25 MED ORDER — HYDROMORPHONE HCL 1 MG/ML IJ SOLN
1.0000 mg | Freq: Once | INTRAMUSCULAR | Status: AC
  Administered 2024-02-25: 1 mg via INTRAVENOUS
  Filled 2024-02-25: qty 1

## 2024-02-25 MED ORDER — DIPHENHYDRAMINE HCL 12.5 MG/5ML PO ELIX: 12.5000 mg | ORAL_SOLUTION | Freq: Four times a day (QID) | ORAL | Status: DC | PRN

## 2024-02-25 MED ORDER — EZETIMIBE 10 MG PO TABS
10.0000 mg | ORAL_TABLET | Freq: Every day | ORAL | Status: DC
  Administered 2024-02-25: 10 mg via ORAL
  Filled 2024-02-25: qty 1

## 2024-02-25 MED ORDER — ONDANSETRON HCL 4 MG/2ML IJ SOLN: 4.0000 mg | Freq: Four times a day (QID) | INTRAMUSCULAR | Status: DC | PRN

## 2024-02-25 MED ORDER — FENTANYL CITRATE PF 50 MCG/ML IJ SOSY
100.0000 ug | PREFILLED_SYRINGE | Freq: Once | INTRAMUSCULAR | Status: AC
  Administered 2024-02-25: 100 ug via INTRAVENOUS
  Filled 2024-02-25: qty 2

## 2024-02-25 MED ORDER — PANTOPRAZOLE SODIUM 40 MG PO TBEC
40.0000 mg | DELAYED_RELEASE_TABLET | Freq: Every day | ORAL | Status: DC
  Administered 2024-02-25: 40 mg via ORAL
  Filled 2024-02-25: qty 1

## 2024-02-25 MED ORDER — DIPHENHYDRAMINE HCL 50 MG/ML IJ SOLN: 25.0000 mg | Freq: Four times a day (QID) | INTRAMUSCULAR | Status: DC | PRN

## 2024-02-25 MED ORDER — ONDANSETRON HCL 4 MG/2ML IJ SOLN
4.0000 mg | Freq: Four times a day (QID) | INTRAMUSCULAR | Status: DC | PRN
  Filled 2024-02-25: qty 2

## 2024-02-25 MED ORDER — ZOLPIDEM TARTRATE 5 MG PO TABS: 5.0000 mg | ORAL_TABLET | Freq: Every evening | ORAL | Status: DC | PRN

## 2024-02-25 MED ORDER — SODIUM CHLORIDE 0.9 % IV SOLN
2.0000 g | INTRAVENOUS | Status: DC
  Administered 2024-02-26: 2 g via INTRAVENOUS

## 2024-02-25 MED ORDER — HYDROCODONE-ACETAMINOPHEN 5-325 MG PO TABS: 1.0000 | ORAL_TABLET | Freq: Four times a day (QID) | ORAL | 0 refills | Status: DC | PRN

## 2024-02-25 MED ORDER — SODIUM CHLORIDE 0.9 % IV SOLN: 12.5000 mg | Freq: Four times a day (QID) | INTRAVENOUS | Status: DC | PRN

## 2024-02-25 MED ORDER — OXYCODONE HCL 5 MG PO TABS
5.0000 mg | ORAL_TABLET | ORAL | Status: DC | PRN
Start: 2024-02-25 — End: 2024-02-25

## 2024-02-25 MED ORDER — DIPHENHYDRAMINE HCL 50 MG/ML IJ SOLN: 12.5000 mg | Freq: Four times a day (QID) | INTRAMUSCULAR | Status: DC | PRN

## 2024-02-25 MED ORDER — SENNA 8.6 MG PO TABS: 1.0000 | ORAL_TABLET | Freq: Two times a day (BID) | ORAL | Status: DC

## 2024-02-25 MED ORDER — SODIUM CHLORIDE 0.9 % IV SOLN
2.0000 g | Freq: Once | INTRAVENOUS | Status: AC
  Administered 2024-02-25: 2 g via INTRAVENOUS
  Filled 2024-02-25: qty 20

## 2024-02-25 MED ORDER — OXYCODONE HCL 5 MG PO TABS: 5.0000 mg | ORAL_TABLET | ORAL | Status: DC | PRN

## 2024-02-25 MED ORDER — FENTANYL CITRATE PF 50 MCG/ML IJ SOSY
25.0000 ug | PREFILLED_SYRINGE | Freq: Once | INTRAMUSCULAR | Status: AC
  Administered 2024-02-25: 25 ug via INTRAVENOUS
  Filled 2024-02-25: qty 1

## 2024-02-25 MED ORDER — DIPHENHYDRAMINE HCL 25 MG PO CAPS: 25.0000 mg | ORAL_CAPSULE | Freq: Four times a day (QID) | ORAL | Status: DC | PRN

## 2024-02-25 MED ORDER — BUPROPION HCL ER (XL) 300 MG PO TB24: 300.0000 mg | ORAL_TABLET | Freq: Every morning | ORAL | Status: DC

## 2024-02-25 MED ORDER — SENNA 8.6 MG PO TABS
1.0000 | ORAL_TABLET | Freq: Two times a day (BID) | ORAL | Status: DC
  Administered 2024-02-25 – 2024-02-26 (×2): 8.6 mg via ORAL
  Filled 2024-02-25 (×2): qty 1

## 2024-02-25 MED ORDER — PROCHLORPERAZINE EDISYLATE 10 MG/2ML IJ SOLN
10.0000 mg | INTRAMUSCULAR | Status: DC | PRN
  Administered 2024-02-25: 10 mg via INTRAVENOUS
  Filled 2024-02-25: qty 2

## 2024-02-25 MED ORDER — KCL IN DEXTROSE-NACL 20-5-0.45 MEQ/L-%-% IV SOLN: INTRAVENOUS | Status: DC

## 2024-02-25 NOTE — ED Provider Notes (Signed)
 [facility] Provider Note   CSN: [id number] Arrival date & time: 02/24/24  2301     History  Chief Complaint  Patient presents with   Abdominal Pain    Ann Frederick is a 53 y.o. female.  The history is provided by the patient and the spouse.  Abdominal Pain Pain location:  RUQ Pain severity:  Severe Onset quality:  Sudden Duration:  3 hours Timing:  Constant Progression:  Worsening Chronicity:  New Associated symptoms: nausea and vomiting   Associated symptoms: no chest pain, no diarrhea, no dysuria, no fever, no hematemesis, no hematochezia and no melena   Risk factors: no aspirin use and no NSAID use   Patient with history of hypertension, metabolic syndrome presents with abdominal pain.  Patient reports just before dinnertime she started having right upper quadrant abdominal pain.  She had dinner later on and then began to have nonbloody emesis.  No diarrhea.  She has persistent pain in the right upper quadrant.  No chest pain or shortness of breath.  No fevers. She does not recall having this pain before Patient with previous history of hysterectomy   Past Medical History:  Diagnosis Date   Acne    Anemia    history of   Anxiety    Arthritis    Depression    GERD (gastroesophageal reflux disease)    worse while pregnant   Headache    Migraines   Heart murmur    ? heart murmur in past   History of blood transfusion 2013   Hypertension    Metabolic syndrome     Home Medications Prior to Admission medications   Medication Sig Start Date End Date Taking? Authorizing Provider  HYDROcodone-acetaminophen (NORCO/VICODIN) 5-325 MG tablet Take 1 tablet by mouth every 6 (six) hours as needed for severe pain (pain score 7-10). 02/25/24  Yes Zadie Rhine, MD  ondansetron (ZOFRAN-ODT) 4 MG disintegrating tablet Take 1 tablet (4 mg total) by mouth every 8 (eight) hours as needed. 02/25/24  Yes Zadie Rhine, MD   ALPRAZolam Prudy Feeler) 0.5 MG tablet alprazolam 0.5 mg tablet  TAKE 1 TABLET BY MOUTH THREE TIMES A DAY AS NEEDED FOR ANXIETY    [provider]  buPROPion (WELLBUTRIN XL) 300 MG 24 hr tablet Take 300 mg by mouth every morning. 08/29/20   [provider]  busPIRone (BUSPAR) 15 MG tablet TAKE 1 TABLET BY MOUTH TWICE A DAY 08/11/23   Saguier, Ramon Dredge, PA-C  ezetimibe (ZETIA) 10 MG tablet Take 1 tablet (10 mg total) by mouth daily. 11/19/23   Saguier, Ramon Dredge, PA-C  Insulin Pen Needle (B-D ULTRAFINE III SHORT PEN) 31G X 8 MM MISC USE AS DIRECTED TO INJECT SAXENDA 10/11/23   Saguier, Ramon Dredge, PA-C  metFORMIN (GLUCOPHAGE) 500 MG tablet TAKE 1 TABLET BY MOUTH TWICE A DAY WITH FOOD 12/12/23   Saguier, Ramon Dredge, PA-C  omeprazole (PRILOSEC) 20 MG capsule Take 1 capsule (20 mg total) by mouth daily. 06/25/23   Palumbo, April, MD  Semaglutide-Weight Management [facility]) 0.25 MG/0.5ML SOAJ INJECT 0.25 MG SUBCUTANEOUSLY ONE TIME PER WEEK 02/24/24   Saguier, Ramon Dredge, PA-C  Turmeric 500 MG CAPS Take 1 capsule by mouth 2 (two) times daily.    [provider]  VITAMIN D PO Take 1,000 Units by mouth 2 (two) times daily.    [provider]  zolpidem (AMBIEN) 5 MG tablet Take 5 mg by mouth at bedtime as needed for sleep.    [provider]      Allergies    Azithromycin    Review of Systems   Review of Systems  Constitutional:  Negative for fever.  Cardiovascular:  Negative for chest pain.  Gastrointestinal:  Positive for abdominal pain, nausea and vomiting. Negative for diarrhea, hematemesis, hematochezia and melena.  Genitourinary:  Negative for dysuria and flank pain.    Physical Exam Updated Vital Signs BP (!) 165/103   Pulse 91   Temp 97.8 F (36.6 C) (Oral)   Resp 14   Ht 1.575 m (5\' 2" )   Wt 74.4 kg   LMP 02/11/2016 (Exact Date)   SpO2 98%   BMI 30.00 kg/m  Physical Exam CONSTITUTIONAL: Well developed/well nourished HEAD: Normocephalic/atraumatic EYES:  EOMI/PERRL, no icterus ENMT: Mucous membranes moist NECK: supple no meningeal signs CV: S1/S2 noted, no murmurs/rubs/gallops noted LUNGS: Lungs are clear to auscultation bilaterally, no apparent distress ABDOMEN: soft, moderate RUQ/epigastric tenderness, no rebound or guarding, bowel sounds noted throughout abdomen GU:no cva tenderness NEURO: Pt is awake/alert/appropriate, moves all extremitiesx4.  No facial droop.   EXTREMITIES: pulses normal/equal, full ROM SKIN: warm, color normal PSYCH: no abnormalities of mood noted, alert and oriented to situation  ED Results / Procedures / Treatments   Labs (all labs ordered are listed, but only abnormal results are displayed) Labs Reviewed  COMPREHENSIVE METABOLIC PANEL - Abnormal; Notable for the following components:      Result Value   Potassium 3.4 (*)    Glucose, Bld 124 (*)    All other components within normal limits  CBC - Abnormal; Notable for the following components:   WBC 12.8 (*)    Platelets 520 (*)    All other components within normal limits  LIPASE, BLOOD    EKG EKG Interpretation Date/Time:  Saturday February 25 2024 00:28:58 EST Ventricular Rate:  73 PR Interval:  160 QRS Duration:  86 QT Interval:  369 QTC Calculation: 407 R Axis:   60  Text Interpretation: Sinus rhythm No significant change since last tracing Confirmed by Zadie Rhine ([id number]) on 02/25/2024 12:56:36 AM  Radiology CT ABDOMEN PELVIS W CONTRAST Result Date: 02/25/2024 CLINICAL DATA:  Right upper quadrant pain for 3 hours.  Vomiting x2. EXAM: CT ABDOMEN AND PELVIS WITH CONTRAST TECHNIQUE: Multidetector CT imaging of the abdomen and pelvis was performed using the standard protocol following bolus administration of intravenous contrast. RADIATION DOSE REDUCTION: This exam was performed according to the departmental dose-optimization program which includes automated exposure control, adjustment of the mA and/or kV according to patient size and/or use of  iterative reconstruction technique. CONTRAST:  OMNIPAQUE IOHEXOL 300 MG/ML  SOLN COMPARISON:  CT abdomen and pelvis 02/07/2018 FINDINGS: Lower chest: No acute abnormality. Hepatobiliary: Unremarkable liver. Normal gallbladder. No biliary dilation. Pancreas: Unremarkable. Spleen: Unremarkable. Adrenals/Urinary Tract: Normal adrenal glands. No urinary calculi or hydronephrosis. Bladder is unremarkable. Stomach/Bowel: Normal caliber large and small bowel. No bowel wall thickening. The appendix is normal.Stomach is within normal limits. Vascular/Lymphatic: No significant vascular findings are present. No enlarged abdominal or pelvic lymph nodes. Reproductive: Hysterectomy. The right ovary is not visualized. 5.5 cm left adnexal cyst. Other: No free intraperitoneal fluid or air. Musculoskeletal: No acute fracture.  Bilateral THAs. IMPRESSION: 1. No acute abnormality in the abdomen or pelvis. 2. 5.5 cm left adnexal cyst. Follow-up pelvic ultrasound in 6-12 months is recommended. Reference: JACR 2020 Feb;17(2):248-254 Electronically Signed   By: Minerva Fester M.D.   On: 02/25/2024 02:45    Procedures Procedures  Medications Ordered in ED Medications  fentaNYL (SUBLIMAZE) injection 100 mcg (100 mcg Intravenous Given 02/25/24 0018)  ondansetron (ZOFRAN) injection 4 mg (4 mg Intravenous Given 02/25/24 0018)  ondansetron (ZOFRAN) injection 4 mg (4 mg Intravenous Given 02/25/24 0140)  morphine (PF) 4 MG/ML injection 4 mg (4 mg Intravenous Given 02/25/24 0140)  iohexol (OMNIPAQUE) 300 MG/ML solution 100 mL (100 mLs Intravenous Contrast Given 02/25/24 0216)  fentaNYL (SUBLIMAZE) injection 25 mcg (25 mcg Intravenous Given 02/25/24 0314)  ketorolac (TORADOL) 15 MG/ML injection 15 mg (15 mg Intravenous Given 02/25/24 0413)    ED Course/ Medical Decision Making/ A&P Clinical Course as of 02/25/24 0437  Sat Feb 25, 2024  0158 WBC(!): 12.8 Leukocytosis [DW]  0202 Patient presents with persistent upper abdominal pain,  will obtain CT imaging.  Patient did have brief episodes of hypoxia after pain medicine, but this is improved [DW]  0357 Overall patient is in no acute distress.  No acute findings on CT imaging.  Patient still tender in the right upper quadrant, therefore will need further evaluation for biliary disease [DW]  0358 She has had  no tachycardia, denies any shortness of breath, denies any pleuritic chest pain, she is a non-smoker, no recent travel or surgery, and is not on estrogen. Low suspicion for PE or right lower lobe pneumonia triggering this pain [DW]  0435 Pt resting comfortably Still reports pain but overall appears improved Plan for RUQ [place] later today Short course of pain meds provider  [DW]    Clinical Course User Index [DW] Zadie Rhine, MD                                 Medical Decision Making Amount and/or Complexity of Data Reviewed Labs: ordered. Decision-making details documented in ED Course. Radiology: ordered. ECG/medicine tests: ordered.  Risk Prescription drug management.   This patient presents to the ED for concern of abdominal pain, this involves an extensive number of treatment options, and is a complaint that carries with it a high risk of complications and morbidity.  The differential diagnosis includes but is not limited to cholecystitis, cholelithiasis, pancreatitis, gastritis, peptic ulcer disease, appendicitis, bowel obstruction, bowel perforation, diverticulitis, AAA, ischemic bowel\   Comorbidities that complicate the patient evaluation: Patient's presentation is complicated by their history of hypertension  Social Determinants of Health: Patient's  occasional alcohol use   increases the complexity of managing their presentation  Additional history obtained: Additional history obtained from spouse  Lab Tests: I Ordered, and personally interpreted labs.  The pertinent results include: Leukocytosis  Imaging Studies ordered: I ordered imaging  studies including CT scan abdomen pelvis   I independently visualized and interpreted imaging which showed adenxal cyst, no acute findings I agree with the radiologist interpretation  Medicines ordered and prescription drug management: I ordered medication including fentanyl for pain Reevaluation of the patient after these medicines showed that the patient    stayed the same  Reevaluation: After the interventions noted above, I reevaluated the patient and found that they have :improved  Complexity of problems addressed: Patient's presentation is most consistent with  acute presentation with potential threat to life or bodily function  Disposition: After consideration of the diagnostic results and the patient's response to treatment,  I feel that the patent would benefit from discharge   .           Final Clinical Impression(s) / ED Diagnoses Final diagnoses:  Right upper quadrant abdominal pain  Adnexal cyst    Rx / DC Orders ED Discharge Orders          Ordered    US ABDOMEN LIMITED RUQ (LIVER/GB)        02/25/24 0356    ondansetron (ZOFRAN-ODT) 4 MG disintegrating tablet  Every 8 hours PRN        02/25/24 0435    HYDROcodone-acetaminophen (NORCO/VICODIN) 5-325 MG tablet  Every 6 hours PRN        02/25/24 0435              Zadie Rhine, MD 02/25/24 [id number]

## 2024-02-25 NOTE — H&P (Signed)
 CC: abd pain   HPI: Ann Frederick is an 53 y.o. female who is here for RUQ pain that started yesterday after dinner.  This was accompanied by nausea and vomiting.  Pain has persisted since then.  Past Medical History:  Diagnosis Date   Acne    Anemia    history of   Anxiety    Arthritis    Depression    GERD (gastroesophageal reflux disease)    worse while pregnant   Headache    Migraines   Heart murmur    ? heart murmur in past   History of blood transfusion 2013   Hypertension    Metabolic syndrome     Past Surgical History:  Procedure Laterality Date   ANTERIOR FUSION CERVICAL SPINE  2018   CESAREAN SECTION     x 2   CESAREAN SECTION  09/29/2012   Procedure: CESAREAN SECTION;  Surgeon: Turner Daniels, MD;  Location: [facility];  Service: Obstetrics;  Laterality: N/A;  Repeat edc 10/12/12/REQUEST;Chassity,Dee,Colleen   COLONOSCOPY     ESOPHAGOGASTRODUODENOSCOPY  normal    7/12   JOINT REPLACEMENT Bilateral    LAPAROSCOPIC VAGINAL HYSTERECTOMY WITH SALPINGECTOMY Bilateral 02/23/2016   Procedure: LAPAROSCOPIC ASSISTED VAGINAL HYSTERECTOMY WITH bilateral SALPINGECTOMY, right oophorectomy. laporotic repair of incidental cystotomy.;  Surgeon: Candice Camp, MD;  Location: [facility];  Service: Gynecology;  Laterality: Bilateral;   MOUTH SURGERY      Family History  Problem Relation Age of Onset   Depression Mother    Hypertension Mother    Diabetes Mother    Obesity Mother    Thyroid disease Mother    Cancer Father        ? lung CA   Kidney disease Father    Hydrocephalus Sister    Colon cancer Neg Hx    Esophageal cancer Neg Hx    Liver cancer Neg Hx    Pancreatic cancer Neg Hx    Rectal cancer Neg Hx    Stomach cancer Neg Hx     Social:  reports that she has never smoked. She has never used smokeless tobacco. She reports current alcohol use. She reports that she does not use drugs.  Allergies:  Allergies  Allergen Reactions   Azithromycin Diarrhea and Nausea  And Vomiting    Medications: I have reviewed the patient's current medications.  Results for orders placed or performed during the hospital encounter of 02/25/24 (from the past 48 hours)  CBC with Differential     Status: Abnormal   Collection Time: 02/25/24  3:55 PM  Result Value Ref Range   WBC 12.0 (H) 4.0 - 10.5 K/uL   RBC 4.15 3.87 - 5.11 MIL/uL   Hemoglobin 12.9 12.0 - 15.0 g/dL   HCT 16.1 09.6 - 04.5 %   MCV 93.0 80.0 - 100.0 fL   MCH 31.1 26.0 - 34.0 pg   MCHC 33.4 30.0 - 36.0 g/dL   RDW 40.9 81.1 - 91.4 %   Platelets 458 (H) 150 - 400 K/uL   nRBC 0.0 0.0 - 0.2 %   Neutrophils Relative % 66 %   Neutro Abs 8.1 (H) 1.7 - 7.7 K/uL   Lymphocytes Relative 24 %   Lymphs Abs 2.9 0.7 - 4.0 K/uL   Monocytes Relative 7 %   Monocytes Absolute 0.8 0.1 - 1.0 K/uL   Eosinophils Relative 2 %   Eosinophils Absolute 0.2 0.0 - 0.5 K/uL   Basophils Relative 1 %   Basophils  Absolute 0.1 0.0 - 0.1 K/uL   Immature Granulocytes 0 %   Abs Immature Granulocytes 0.02 0.00 - 0.07 K/uL    Comment: Performed at [facility], [address] [place], [place] [address]  Lipase, blood     Status: None   Collection Time: 02/25/24  3:55 PM  Result Value Ref Range   Lipase 27 11 - 51 U/L    Comment: Performed at [facility], [address] [place], [place] [address]  Comprehensive metabolic panel     Status: Abnormal   Collection Time: 02/25/24  3:55 PM  Result Value Ref Range   Sodium 136 135 - 145 mmol/L   Potassium 3.4 (L) 3.5 - 5.1 mmol/L   Chloride 103 98 - 111 mmol/L   CO2 28 22 - 32 mmol/L   Glucose, Bld 85 70 - 99 mg/dL    Comment: Glucose reference range applies only to samples taken after fasting for at least 8 hours.   BUN 6 6 - 20 mg/dL   Creatinine, Ser 0.98 (H) 0.44 - 1.00 mg/dL   Calcium 8.7 (L) 8.9 - 10.3 mg/dL   Total Protein 6.5 6.5 - 8.1 g/dL   Albumin 3.6 3.5 - 5.0 g/dL   AST 17 15 - 41 U/L   ALT 16 0 - 44 U/L   Alkaline Phosphatase 48 38 - 126  U/L   Total Bilirubin 1.1 0.0 - 1.2 mg/dL   GFR, Estimated >11 >91 mL/min    Comment: (NOTE) Calculated using the CKD-EPI Creatinine Equation (2021)    Anion gap 5 5 - 15    Comment: Performed at [facility], [address] [place], [place] [address]    US ABDOMEN LIMITED RUQ (LIVER/GB) Result Date: 02/25/2024 CLINICAL DATA:  Right upper quadrant pain.  Nausea and vomiting. EXAM: ULTRASOUND ABDOMEN LIMITED RIGHT UPPER QUADRANT COMPARISON:  CT earlier today FINDINGS: Gallbladder: Moderately distended. Multiple gallstones with edematous gallbladder wall thickening measuring up to 6 mm. Nodular wall thickening versus adherent stones. Small pericholecystic fluid. Positive sonographic Murphy sign noted by sonographer. Common bile duct: Diameter: 3-4 mm, normal. Liver: No focal lesion identified. Diffusely increased in parenchymal echogenicity. Portal vein is patent on color Doppler imaging with normal direction of blood flow towards the liver. Other: None. IMPRESSION: 1. Sonographic findings consistent with acute cholecystitis in the appropriate clinical setting. 2. No biliary dilatation. 3. Hepatic steatosis. Electronically Signed   By: Narda Rutherford M.D.   On: 02/25/2024 14:07   CT ABDOMEN PELVIS W CONTRAST Result Date: 02/25/2024 CLINICAL DATA:  Right upper quadrant pain for 3 hours.  Vomiting x2. EXAM: CT ABDOMEN AND PELVIS WITH CONTRAST TECHNIQUE: Multidetector CT imaging of the abdomen and pelvis was performed using the standard protocol following bolus administration of intravenous contrast. RADIATION DOSE REDUCTION: This exam was performed according to the departmental dose-optimization program which includes automated exposure control, adjustment of the mA and/or kV according to patient size and/or use of iterative reconstruction technique. CONTRAST:  OMNIPAQUE IOHEXOL 300 MG/ML  SOLN COMPARISON:  CT abdomen and pelvis 02/07/2018 FINDINGS: Lower chest: No acute abnormality.  Hepatobiliary: Unremarkable liver. Normal gallbladder. No biliary dilation. Pancreas: Unremarkable. Spleen: Unremarkable. Adrenals/Urinary Tract: Normal adrenal glands. No urinary calculi or hydronephrosis. Bladder is unremarkable. Stomach/Bowel: Normal caliber large and small bowel. No bowel wall thickening. The appendix is normal.Stomach is within normal limits. Vascular/Lymphatic: No significant vascular findings are present. No enlarged abdominal or pelvic lymph nodes. Reproductive:  Hysterectomy. The right ovary is not visualized. 5.5 cm left adnexal cyst. Other: No free intraperitoneal fluid or air. Musculoskeletal: No acute fracture.  Bilateral THAs. IMPRESSION: 1. No acute abnormality in the abdomen or pelvis. 2. 5.5 cm left adnexal cyst. Follow-up pelvic ultrasound in 6-12 months is recommended. Reference: JACR 2020 Feb;17(2):248-254 Electronically Signed   By: Minerva Fester M.D.   On: 02/25/2024 02:45    ROS - all of the below systems have been reviewed with the patient and positives are indicated with bold text General: chills, fever or night sweats Eyes: blurry vision or double vision ENT: epistaxis or sore throat Hematologic/Lymphatic: bleeding problems, blood clots or swollen lymph nodes Endocrine: temperature intolerance or unexpected weight changes Breast: new or changing breast lumps or nipple discharge Resp: cough, shortness of breath, or wheezing CV: chest pain or dyspnea on exertion GI: as per HPI GU: dysuria, trouble voiding, or hematuria Neuro: TIA or stroke symptoms    PE Blood pressure (!) 120/95, pulse 79, temperature 98 F (36.7 C), resp. rate 16, height 5\' 2"  (1.575 m), weight 74.4 kg, last menstrual period 02/11/2016, SpO2 97%. Constitutional: NAD; conversant; no deformities Eyes: Moist conjunctiva; no lid lag; anicteric; PERRL Neck: Trachea midline; no thyromegaly Lungs: Normal respiratory effort CV: RRR GI: Abd TTP RUQ MSK: Normal range of motion of  extremities; no clubbing/cyanosis Psychiatric: Appropriate affect; alert and oriented x3  Results for orders placed or performed during the hospital encounter of 02/25/24 (from the past 48 hours)  CBC with Differential     Status: Abnormal   Collection Time: 02/25/24  3:55 PM  Result Value Ref Range   WBC 12.0 (H) 4.0 - 10.5 K/uL   RBC 4.15 3.87 - 5.11 MIL/uL   Hemoglobin 12.9 12.0 - 15.0 g/dL   HCT 54.0 98.1 - 19.1 %   MCV 93.0 80.0 - 100.0 fL   MCH 31.1 26.0 - 34.0 pg   MCHC 33.4 30.0 - 36.0 g/dL   RDW 47.8 29.5 - 62.1 %   Platelets 458 (H) 150 - 400 K/uL   nRBC 0.0 0.0 - 0.2 %   Neutrophils Relative % 66 %   Neutro Abs 8.1 (H) 1.7 - 7.7 K/uL   Lymphocytes Relative 24 %   Lymphs Abs 2.9 0.7 - 4.0 K/uL   Monocytes Relative 7 %   Monocytes Absolute 0.8 0.1 - 1.0 K/uL   Eosinophils Relative 2 %   Eosinophils Absolute 0.2 0.0 - 0.5 K/uL   Basophils Relative 1 %   Basophils Absolute 0.1 0.0 - 0.1 K/uL   Immature Granulocytes 0 %   Abs Immature Granulocytes 0.02 0.00 - 0.07 K/uL    Comment: Performed at [facility], [address] [place], [place] [address]  Lipase, blood     Status: None   Collection Time: 02/25/24  3:55 PM  Result Value Ref Range   Lipase 27 11 - 51 U/L    Comment: Performed at [facility], [address] [place], [place] [address]  Comprehensive metabolic panel     Status: Abnormal   Collection Time: 02/25/24  3:55 PM  Result Value Ref Range   Sodium 136 135 - 145 mmol/L   Potassium 3.4 (L) 3.5 - 5.1 mmol/L   Chloride 103 98 - 111 mmol/L   CO2 28 22 - 32 mmol/L   Glucose, Bld 85 70 - 99 mg/dL    Comment: Glucose reference range applies only to samples taken after fasting  for at least 8 hours.   BUN 6 6 - 20 mg/dL   Creatinine, Ser 6.57 (H) 0.44 - 1.00 mg/dL   Calcium 8.7 (L) 8.9 - 10.3 mg/dL   Total Protein 6.5 6.5 - 8.1 g/dL   Albumin 3.6 3.5 - 5.0 g/dL   AST 17 15 - 41 U/L   ALT 16 0 - 44 U/L   Alkaline Phosphatase 48 38 -  126 U/L   Total Bilirubin 1.1 0.0 - 1.2 mg/dL   GFR, Estimated >84 >69 mL/min    Comment: (NOTE) Calculated using the CKD-EPI Creatinine Equation (2021)    Anion gap 5 5 - 15    Comment: Performed at [facility], [address] [place], [place] [address]    US ABDOMEN LIMITED RUQ (LIVER/GB) Result Date: 02/25/2024 CLINICAL DATA:  Right upper quadrant pain.  Nausea and vomiting. EXAM: ULTRASOUND ABDOMEN LIMITED RIGHT UPPER QUADRANT COMPARISON:  CT earlier today FINDINGS: Gallbladder: Moderately distended. Multiple gallstones with edematous gallbladder wall thickening measuring up to 6 mm. Nodular wall thickening versus adherent stones. Small pericholecystic fluid. Positive sonographic Murphy sign noted by sonographer. Common bile duct: Diameter: 3-4 mm, normal. Liver: No focal lesion identified. Diffusely increased in parenchymal echogenicity. Portal vein is patent on color Doppler imaging with normal direction of blood flow towards the liver. Other: None. IMPRESSION: 1. Sonographic findings consistent with acute cholecystitis in the appropriate clinical setting. 2. No biliary dilatation. 3. Hepatic steatosis. Electronically Signed   By: Narda Rutherford M.D.   On: 02/25/2024 14:07   CT ABDOMEN PELVIS W CONTRAST Result Date: 02/25/2024 CLINICAL DATA:  Right upper quadrant pain for 3 hours.  Vomiting x2. EXAM: CT ABDOMEN AND PELVIS WITH CONTRAST TECHNIQUE: Multidetector CT imaging of the abdomen and pelvis was performed using the standard protocol following bolus administration of intravenous contrast. RADIATION DOSE REDUCTION: This exam was performed according to the departmental dose-optimization program which includes automated exposure control, adjustment of the mA and/or kV according to patient size and/or use of iterative reconstruction technique. CONTRAST:  OMNIPAQUE IOHEXOL 300 MG/ML  SOLN COMPARISON:  CT abdomen and pelvis 02/07/2018 FINDINGS: Lower chest: No acute abnormality.  Hepatobiliary: Unremarkable liver. Normal gallbladder. No biliary dilation. Pancreas: Unremarkable. Spleen: Unremarkable. Adrenals/Urinary Tract: Normal adrenal glands. No urinary calculi or hydronephrosis. Bladder is unremarkable. Stomach/Bowel: Normal caliber large and small bowel. No bowel wall thickening. The appendix is normal.Stomach is within normal limits. Vascular/Lymphatic: No significant vascular findings are present. No enlarged abdominal or pelvic lymph nodes. Reproductive: Hysterectomy. The right ovary is not visualized. 5.5 cm left adnexal cyst. Other: No free intraperitoneal fluid or air. Musculoskeletal: No acute fracture.  Bilateral THAs. IMPRESSION: 1. No acute abnormality in the abdomen or pelvis. 2. 5.5 cm left adnexal cyst. Follow-up pelvic ultrasound in 6-12 months is recommended. Reference: JACR 2020 Feb;17(2):248-254 Electronically Signed   By: Minerva Fester M.D.   On: 02/25/2024 02:45     A/P: LEETA GRIMME is an 53 y.o. female with new onset RUQ pain and US showing distended gallbladder with thickened walls and pericholecystic fluid.    Will admit to floor IV abx NPO p MN Plan for OR in AM   Vanita Panda, MD  Colorectal and General Surgery [facility]   moderate decision making.

## 2024-02-25 NOTE — ED Notes (Signed)
 Noted intermittent periods of hypoxia when pt sleeps. Placed pt on 2L supplemental oxygen and O2 sat quickly increased to 100%. NAD and awake/alert at this time.

## 2024-02-25 NOTE — ED Provider Notes (Signed)
 [facility] Provider Note  CSN: [id number] Arrival date & time: 02/25/24 1421  Chief Complaint(s) Abdominal Pain  HPI Ann Frederick is a 53 y.o. female with past medical history as below, significant for hypertension, obesity, OSA hysterectomy who presents to the ED with complaint of right upper quadrant abdominal pain/positive ultrasound  Right upper quadrant abd pain onset last night.  She was seen in the ER last night, she had a CT abdomen pelvis with contrast that was reassuring, she came back today for a right upper quad ultrasound because ultrasound was unavailable overnight which was concerning for acute cholecystitis.  She has no jaundice or fever, nausea without vomiting.  No change to bowel or bladder function per last p.o. was yesterday evening.  No blood thinners  Past Medical History Past Medical History:  Diagnosis Date   Acne    Anemia    history of   Anxiety    Arthritis    Depression    GERD (gastroesophageal reflux disease)    worse while pregnant   Headache    Migraines   Heart murmur    ? heart murmur in past   History of blood transfusion 2013   Hypertension    Metabolic syndrome    Patient Active Problem List   Diagnosis Date Noted   Acute cholecystitis 02/25/2024   Cholecystitis 02/25/2024   Left hip pain 08/27/2019   Class 1 obesity due to excess calories without serious comorbidity with body mass index (BMI) of 34.0 to 34.9 in adult 11/28/2018   Heart palpitations 11/28/2018   OSA (obstructive sleep apnea) 09/14/2018   Neck pain 04/26/2018   Wellness examination 03/08/2016   S/P laparoscopic assisted vaginal hysterectomy (LAVH) 02/23/2016   Pink eye 06/19/2015   Pain of right thumb 06/19/2015   Sinusitis, acute frontal 02/05/2015   Headache around the eyes 02/05/2015   Back pain 11/01/2014   Routine general medical examination at a health care facility 10/30/2013   Vesicular rash 09/07/2013   Sinusitis 02/02/2013   Viral  pharyngitis 03/03/2012   Flank pain 08/13/2011   Inguinal adenopathy 08/13/2011   Dysphagia 04/20/2011   GERD 01/29/2011   FATIGUE 01/29/2011   SCIATICA, BILATERAL 06/18/2010   LEG PAIN, BILATERAL 06/06/2010   HIP PAIN, LEFT 04/01/2010   MASS, LOCALIZED, SUPERFICIAL 03/05/2009   ACNE VULGARIS 07/11/2007   MURMUR 07/11/2007   Home Medication(s) Prior to Admission medications   Medication Sig Start Date End Date Taking? Authorizing Provider  ALPRAZolam Prudy Feeler) 0.5 MG tablet Take 0.5 mg by mouth 3 (three) times daily as needed for anxiety.   Yes [provider]  aspirin EC 81 MG tablet Take 81 mg by mouth in the morning. Swallow whole.   Yes [provider]  buPROPion (WELLBUTRIN XL) 300 MG 24 hr tablet Take 300 mg by mouth every morning. 08/29/20  Yes [provider]  busPIRone (BUSPAR) 15 MG tablet TAKE 1 TABLET BY MOUTH TWICE A DAY Patient taking differently: Take 15 mg by mouth in the morning. 08/11/23  Yes Saguier, Ramon Dredge, PA-C  Cholecalciferol (VITAMIN D3) 1000 units CAPS Take 1,000 Units by mouth daily.   Yes [provider]  ezetimibe (ZETIA) 10 MG tablet Take 1 tablet (10 mg total) by mouth daily. 11/19/23  Yes Saguier, Ramon Dredge, PA-C  Ferrous Sulfate (SLOW FE PO) Take 1 tablet by mouth daily with breakfast.   Yes [provider]  LYSINE PO Take 1 capsule by mouth daily.   Yes [provider]  metFORMIN (GLUCOPHAGE) 500 MG tablet TAKE 1 TABLET BY MOUTH TWICE A DAY WITH FOOD Patient taking differently: Take 500 mg by mouth daily with breakfast. 12/12/23  Yes Saguier, Ramon Dredge, PA-C  omeprazole (PRILOSEC) 20 MG capsule Take 1 capsule (20 mg total) by mouth daily. Patient taking differently: Take 20 mg by mouth daily as needed (for heartburn). 06/25/23  Yes Palumbo, April, MD  ondansetron (ZOFRAN-ODT) 4 MG disintegrating tablet Take 1 tablet (4 mg total) by mouth every 8 (eight) hours as needed. Patient taking differently: Take 4 mg by  mouth every 8 (eight) hours as needed for nausea or vomiting (dissolve orally). 02/25/24  Yes Zadie Rhine, MD  Prenatal Vit-Fe Fumarate-FA (PRENATAL MULTIVITAMIN) TABS tablet Take 1 tablet by mouth daily at 12 noon.   Yes [provider]  Semaglutide-Weight Management (WEGOVY) 0.25 MG/0.5ML SOAJ INJECT 0.25 MG SUBCUTANEOUSLY ONE TIME PER WEEK Patient taking differently: Inject 0.25 mg into the skin every Sunday. 02/24/24  Yes Saguier, Ramon Dredge, PA-C  Specialty Vitamins Products (ADVANCED COLLAGEN PO) Take 1 tablet by mouth daily.   Yes [provider]  Zinc 50 MG TABS Take 50 mg by mouth daily.   Yes [provider]  zolpidem (AMBIEN) 10 MG tablet Take 5-10 mg by mouth at bedtime as needed for sleep.   Yes [provider]  HYDROcodone-acetaminophen (NORCO/VICODIN) 5-325 MG tablet Take 1 tablet by mouth every 6 (six) hours as needed for severe pain (pain score 7-10). Patient not taking: Reported on 02/25/2024 02/25/24   Zadie Rhine, MD  Insulin Pen Needle (B-D ULTRAFINE III SHORT PEN) 31G X 8 MM MISC USE AS DIRECTED TO INJECT SAXENDA 10/11/23   Saguier, Kateri Mc                                                                                                                                    Past Surgical History Past Surgical History:  Procedure Laterality Date   ANTERIOR FUSION CERVICAL SPINE  2018   CESAREAN SECTION     x 2   CESAREAN SECTION  09/29/2012   Procedure: CESAREAN SECTION;  Surgeon: Turner Daniels, MD;  Location: [facility];  Service: Obstetrics;  Laterality: N/A;  Repeat edc 10/12/12/REQUEST;Chassity,Dee,Colleen   COLONOSCOPY     ESOPHAGOGASTRODUODENOSCOPY  normal    7/12   JOINT REPLACEMENT Bilateral    LAPAROSCOPIC VAGINAL HYSTERECTOMY WITH SALPINGECTOMY Bilateral 02/23/2016   Procedure: LAPAROSCOPIC ASSISTED VAGINAL HYSTERECTOMY WITH bilateral SALPINGECTOMY, right oophorectomy. laporotic repair of incidental cystotomy.;  Surgeon: Candice Camp, MD;  Location: [facility];  Service: Gynecology;  Laterality: Bilateral;   MOUTH SURGERY     Family History Family History  Problem Relation Age of Onset   Depression Mother    Hypertension Mother    Diabetes Mother    Obesity Mother    Thyroid disease Mother    Cancer Father        ? lung  [place]   Kidney disease Father    Hydrocephalus Sister    Colon cancer Neg Hx    Esophageal cancer Neg Hx    Liver cancer Neg Hx    Pancreatic cancer Neg Hx    Rectal cancer Neg Hx    Stomach cancer Neg Hx     Social History Social History   Tobacco Use   Smoking status: Never   Smokeless tobacco: Never  Vaping Use   Vaping status: Never Used  Substance Use Topics   Alcohol use: Yes    Comment: 1 glass of wine per week prior to preg   Drug use: No   Allergies Azithromycin and Dilaudid [hydromorphone]  Review of Systems A thorough review of systems was obtained and all systems are negative except as noted in the HPI and PMH.   Physical Exam Vital Signs  I have reviewed the triage vital signs BP 131/86 (BP Location: Right Arm)   Pulse 76   Temp 97.8 F (36.6 C)   Resp 17   Ht 5\' 2"  (1.575 m)   Wt 74.4 kg   LMP 02/11/2016 (Exact Date)   SpO2 99%   BMI 30.00 kg/m  Physical Exam Vitals and nursing note reviewed.  Constitutional:      General: She is not in acute distress.    Appearance: Normal appearance. She is well-developed. She is not ill-appearing.  HENT:     Head: Normocephalic and atraumatic.     Right Ear: External ear normal.     Left Ear: External ear normal.     Nose: Nose normal.     Mouth/Throat:     Mouth: Mucous membranes are moist.  Eyes:     General: No scleral icterus.       Right eye: No discharge.        Left eye: No discharge.  Cardiovascular:     Rate and Rhythm: Normal rate and regular rhythm.     Pulses: Normal pulses.     Heart sounds: Normal heart sounds.  Pulmonary:     Effort: Pulmonary effort is normal. No respiratory distress.      Breath sounds: Normal breath sounds. No stridor.  Abdominal:     General: Abdomen is flat. There is no distension.     Palpations: Abdomen is soft.     Tenderness: There is abdominal tenderness in the right upper quadrant. Positive signs include Murphy's sign.  Musculoskeletal:     Cervical back: No rigidity.     Right lower leg: No edema.     Left lower leg: No edema.  Skin:    General: Skin is warm and dry.     Capillary Refill: Capillary refill takes less than 2 seconds.  Neurological:     Mental Status: She is alert.  Psychiatric:        Mood and Affect: Mood normal.        Behavior: Behavior normal. Behavior is cooperative.     ED Results and Treatments Labs (all labs ordered are listed, but only abnormal results are displayed) Labs Reviewed  CBC WITH DIFFERENTIAL/PLATELET - Abnormal; Notable for the following components:      Result Value   WBC 12.0 (*)    Platelets 458 (*)    Neutro Abs 8.1 (*)    All other components within normal limits  COMPREHENSIVE METABOLIC PANEL - Abnormal; Notable for the following components:   Potassium 3.4 (*)    Creatinine, Ser 1.02 (*)    Calcium  8.7 (*)    All other components within normal limits  LIPASE, BLOOD  COMPREHENSIVE METABOLIC PANEL  CBC  HIV ANTIBODY (ROUTINE TESTING W REFLEX)                                                                                                                          Radiology US ABDOMEN LIMITED RUQ (LIVER/GB) Result Date: 02/25/2024 CLINICAL DATA:  Right upper quadrant pain.  Nausea and vomiting. EXAM: ULTRASOUND ABDOMEN LIMITED RIGHT UPPER QUADRANT COMPARISON:  CT earlier today FINDINGS: Gallbladder: Moderately distended. Multiple gallstones with edematous gallbladder wall thickening measuring up to 6 mm. Nodular wall thickening versus adherent stones. Small pericholecystic fluid. Positive sonographic Murphy sign noted by sonographer. Common bile duct: Diameter: 3-4 mm, normal. Liver: No focal  lesion identified. Diffusely increased in parenchymal echogenicity. Portal vein is patent on color Doppler imaging with normal direction of blood flow towards the liver. Other: None. IMPRESSION: 1. Sonographic findings consistent with acute cholecystitis in the appropriate clinical setting. 2. No biliary dilatation. 3. Hepatic steatosis. Electronically Signed   By: Narda Rutherford M.D.   On: 02/25/2024 14:07   CT ABDOMEN PELVIS W CONTRAST Result Date: 02/25/2024 CLINICAL DATA:  Right upper quadrant pain for 3 hours.  Vomiting x2. EXAM: CT ABDOMEN AND PELVIS WITH CONTRAST TECHNIQUE: Multidetector CT imaging of the abdomen and pelvis was performed using the standard protocol following bolus administration of intravenous contrast. RADIATION DOSE REDUCTION: This exam was performed according to the departmental dose-optimization program which includes automated exposure control, adjustment of the mA and/or kV according to patient size and/or use of iterative reconstruction technique. CONTRAST:  OMNIPAQUE IOHEXOL 300 MG/ML  SOLN COMPARISON:  CT abdomen and pelvis 02/07/2018 FINDINGS: Lower chest: No acute abnormality. Hepatobiliary: Unremarkable liver. Normal gallbladder. No biliary dilation. Pancreas: Unremarkable. Spleen: Unremarkable. Adrenals/Urinary Tract: Normal adrenal glands. No urinary calculi or hydronephrosis. Bladder is unremarkable. Stomach/Bowel: Normal caliber large and small bowel. No bowel wall thickening. The appendix is normal.Stomach is within normal limits. Vascular/Lymphatic: No significant vascular findings are present. No enlarged abdominal or pelvic lymph nodes. Reproductive: Hysterectomy. The right ovary is not visualized. 5.5 cm left adnexal cyst. Other: No free intraperitoneal fluid or air. Musculoskeletal: No acute fracture.  Bilateral THAs. IMPRESSION: 1. No acute abnormality in the abdomen or pelvis. 2. 5.5 cm left adnexal cyst. Follow-up pelvic ultrasound in 6-12 months is  recommended. Reference: JACR 2020 Feb;17(2):248-254 Electronically Signed   By: Minerva Fester M.D.   On: 02/25/2024 02:45    Pertinent labs & imaging results that were available during my care of the patient were reviewed by me and considered in my medical decision making (see MDM for details).  Medications Ordered in ED Medications  enoxaparin (LOVENOX) injection 40 mg (0 mg Subcutaneous Hold 02/25/24 2108)  dextrose 5 % and 0.45 % NaCl with KCl 20 mEq/L infusion ( Intravenous New Bag/Given 02/25/24 2106)  cefTRIAXone (ROCEPHIN) 2 g in sodium chloride 0.9 % 100 mL IVPB (  has no administration in time range)  oxyCODONE (Oxy IR/ROXICODONE) immediate release tablet 5-10 mg (has no administration in time range)  morphine (PF) 2 MG/ML injection 2 mg (has no administration in time range)  diphenhydrAMINE (BENADRYL) 12.5 MG/5ML elixir 12.5 mg (has no administration in time range)    Or  diphenhydrAMINE (BENADRYL) injection 12.5 mg (has no administration in time range)  senna (SENOKOT) tablet 8.6 mg (8.6 mg Oral Given 02/25/24 2107)  simethicone (MYLICON) chewable tablet 40 mg (has no administration in time range)  ezetimibe (ZETIA) tablet 10 mg (10 mg Oral Given 02/25/24 1950)  ALPRAZolam (XANAX) tablet 0.5 mg (has no administration in time range)  buPROPion (WELLBUTRIN XL) 24 hr tablet 300 mg (has no administration in time range)  busPIRone (BUSPAR) tablet 15 mg (15 mg Oral Given 02/25/24 2107)  zolpidem (AMBIEN) tablet 5 mg (has no administration in time range)  metFORMIN (GLUCOPHAGE) tablet 500 mg (has no administration in time range)  pantoprazole (PROTONIX) EC tablet 40 mg (40 mg Oral Given 02/25/24 1950)  insulin aspart (novoLOG) injection 0-15 Units (has no administration in time range)  Oral care mouth rinse (has no administration in time range)  prochlorperazine (COMPAZINE) injection 10 mg (10 mg Intravenous Given 02/25/24 2005)  promethazine (PHENERGAN) 12.5 mg in sodium chloride 0.9 % 50 mL IVPB  (has no administration in time range)  cefTRIAXone (ROCEPHIN) 2 g in sodium chloride 0.9 % 100 mL IVPB (0 g Intravenous Stopped 02/25/24 1726)  HYDROmorphone (DILAUDID) injection 1 mg (1 mg Intravenous Given 02/25/24 1651)  ondansetron (ZOFRAN) injection 4 mg (4 mg Intravenous Given 02/25/24 1651)                                                                                                                                     Procedures Procedures  (including critical care time)  Medical Decision Making / ED Course    Medical Decision Making:    SUMIE REMSEN is a 53 y.o. female  with past medical history as below, significant for hypertension, obesity, OSA hysterectomy who presents to the ED with complaint of right upper quadrant abdominal pain/positive ultrasound. The complaint involves an extensive differential diagnosis and also carries with it a high risk of complications and morbidity.  Serious etiology was considered. Ddx includes but is not limited to: Differential diagnosis includes but is not exclusive to acute cholecystitis, intrathoracic causes for epigastric abdominal pain, gastritis, duodenitis, pancreatitis, small bowel or large bowel obstruction, abdominal aortic aneurysm, hernia, gastritis, etc.   Complete initial physical exam performed, notably the patient was in no acute distress, HDS, abdomen nonperitoneal, positive Murphy sign.    Reviewed and confirmed nursing documentation for past medical history, family history, social history.  Vital signs reviewed.       Brief summary: 53 year old female history as above here with right upper quad abdominal pain, ultrasound revealing for cholecystitis No fever or jaundice.  She  has ongoing right quad abdominal pain, positive Murphy sign.  She is HDS, afebrile. Labs repeated today, she has WBC of 12.0.  Potassium also mildly reduced. Her exam is consistent with cholecystitis, will discuss with general surgery.  Plan admission for  surgical evaluation Admit to [facility], send to [facility], plan OR tom               Additional history obtained: -Additional history obtained from family -External records from outside source obtained and reviewed including: Chart review including previous notes, labs, imaging, consultation notes including  Need evaluation, home medications, prior labs and imaging   Lab Tests: -I ordered, reviewed, and interpreted labs.   The pertinent results include:   Labs Reviewed  CBC WITH DIFFERENTIAL/PLATELET - Abnormal; Notable for the following components:      Result Value   WBC 12.0 (*)    Platelets 458 (*)    Neutro Abs 8.1 (*)    All other components within normal limits  COMPREHENSIVE METABOLIC PANEL - Abnormal; Notable for the following components:   Potassium 3.4 (*)    Creatinine, Ser 1.02 (*)    Calcium 8.7 (*)    All other components within normal limits  LIPASE, BLOOD  COMPREHENSIVE METABOLIC PANEL  CBC  HIV ANTIBODY (ROUTINE TESTING W REFLEX)    Notable for WBC elevated  EKG   EKG Interpretation Date/Time:    Ventricular Rate:    PR Interval:    QRS Duration:    QT Interval:    QTC Calculation:   R Axis:      Text Interpretation:           Imaging Studies ordered: RUQ u/s from earlier today CT a/p    Medicines ordered and prescription drug management: Meds ordered this encounter  Medications   cefTRIAXone (ROCEPHIN) 2 g in sodium chloride 0.9 % 100 mL IVPB    Antibiotic Indication::   Intra-abdominal   HYDROmorphone (DILAUDID) injection 1 mg   ondansetron (ZOFRAN) injection 4 mg   enoxaparin (LOVENOX) injection 40 mg   dextrose 5 % and 0.45 % NaCl with KCl 20 mEq/L infusion   cefTRIAXone (ROCEPHIN) 2 g in sodium chloride 0.9 % 100 mL IVPB    Antibiotic Indication::   Intra-abdominal   oxyCODONE (Oxy IR/ROXICODONE) immediate release tablet 5-10 mg   morphine (PF) 2 MG/ML injection 2 mg   OR Linked Order Group    diphenhydrAMINE (BENADRYL) 12.5  MG/5ML elixir 12.5 mg    diphenhydrAMINE (BENADRYL) injection 12.5 mg   senna (SENOKOT) tablet 8.6 mg   DISCONTD: ondansetron (ZOFRAN-ODT) disintegrating tablet 4 mg   DISCONTD: ondansetron (ZOFRAN) injection 4 mg   simethicone (MYLICON) chewable tablet 40 mg   ezetimibe (ZETIA) tablet 10 mg   ALPRAZolam (XANAX) tablet 0.5 mg   buPROPion (WELLBUTRIN XL) 24 hr tablet 300 mg   busPIRone (BUSPAR) tablet 15 mg   zolpidem (AMBIEN) tablet 5 mg   metFORMIN (GLUCOPHAGE) tablet 500 mg   pantoprazole (PROTONIX) EC tablet 40 mg   DISCONTD: enoxaparin (LOVENOX) injection 40 mg   DISCONTD: dextrose 5 % and 0.45 % NaCl with KCl 20 mEq/L infusion   DISCONTD: cefTRIAXone (ROCEPHIN) 2 g in sodium chloride 0.9 % 100 mL IVPB    Antibiotic Indication::   Intra-abdominal   DISCONTD: oxyCODONE (Oxy IR/ROXICODONE) immediate release tablet 5-10 mg   DISCONTD: morphine (PF) 2 MG/ML injection 2 mg   DISCONTD: diphenhydrAMINE (BENADRYL) capsule 25 mg   DISCONTD: diphenhydrAMINE (BENADRYL) injection 25 mg  DISCONTD: senna (SENOKOT) tablet 8.6 mg   DISCONTD: ondansetron (ZOFRAN-ODT) disintegrating tablet 4 mg   DISCONTD: ondansetron (ZOFRAN) injection 4 mg   DISCONTD: simethicone (MYLICON) chewable tablet 40 mg   insulin aspart (novoLOG) injection 0-15 Units    Correction coverage::   Moderate (average weight, post-op)    CBG < 70::   Implement Hypoglycemia Standing Orders and refer to Hypoglycemia Standing Orders sidebar report    CBG 70 - 120::   0 units    CBG 121 - 150::   2 units    CBG 151 - 200::   3 units    CBG 201 - 250::   5 units    CBG 251 - 300::   8 units    CBG 301 - 350::   11 units    CBG 351 - 400::   15 units    CBG > 400:   call MD and obtain STAT lab verification   Oral care mouth rinse   prochlorperazine (COMPAZINE) injection 10 mg   promethazine (PHENERGAN) 12.5 mg in sodium chloride 0.9 % 50 mL IVPB    -I have reviewed the patients home medicines and have made adjustments as  needed   Consultations Obtained: I requested consultation with the [facility],  and discussed lab and imaging findings as well as pertinent plan - they recommend: admit   Cardiac Monitoring: Continuous pulse oximetry interpreted by myself, 99% on RA.    Social Determinants of Health:  Diagnosis or treatment significantly limited by social determinants of health: na   Reevaluation: After the interventions noted above, I reevaluated the patient and found that they have improved  Co morbidities that complicate the patient evaluation  Past Medical History:  Diagnosis Date   Acne    Anemia    history of   Anxiety    Arthritis    Depression    GERD (gastroesophageal reflux disease)    worse while pregnant   Headache    Migraines   Heart murmur    ? heart murmur in past   History of blood transfusion 2013   Hypertension    Metabolic syndrome       Dispostion: Disposition decision including need for hospitalization was considered, and patient admitted to the hospital.    Final Clinical Impression(s) / ED Diagnoses Final diagnoses:  Acute cholecystitis        Sloan Leiter, DO 02/26/24 0010

## 2024-02-25 NOTE — ED Notes (Signed)
 Ordered outpatient [place] scheduled for pt at 1 pm per pt request with Annamarie Dawley. Imaging technician. Pt informed of orders for NPO 8 hours prior to exam. Pt verbalized understanding of these instructions.

## 2024-02-25 NOTE — ED Notes (Signed)
Pt drank water without difficulty

## 2024-02-25 NOTE — Plan of Care (Signed)
   Problem: Education: Goal: Knowledge of General Education information will improve Description: Including pain rating scale, medication(s)/side effects and non-pharmacologic comfort measures Outcome: Progressing   Problem: Activity: Goal: Risk for activity intolerance will decrease Outcome: Progressing   Problem: Pain Managment: Goal: General experience of comfort will improve and/or be controlled Outcome: Progressing

## 2024-02-25 NOTE — Discharge Instructions (Signed)
 PLEASE RETURN TODAY AT 1PM FOR YOUR ULTRASOUND  BE SURE TO SEE YOUR GYNECOLOGIST IN 6 MONTHS FOR AN ULTRASOUND OF YOUR OVARIES FOR THE CYST

## 2024-02-25 NOTE — ED Triage Notes (Signed)
 Pt returning d/t outpt [place] results; RUQ pain continues

## 2024-02-26 ENCOUNTER — Observation Stay (HOSPITAL_COMMUNITY): Admitting: Anesthesiology

## 2024-02-26 ENCOUNTER — Encounter (HOSPITAL_COMMUNITY)
Admission: EM | Disposition: A | Discharge status: Home / Self Care | Payer: Self-pay | Source: Home / Self Care | Attending: Emergency Medicine

## 2024-02-26 ENCOUNTER — Other Ambulatory Visit: Payer: Self-pay

## 2024-02-26 ENCOUNTER — Observation Stay (HOSPITAL_COMMUNITY)

## 2024-02-26 DIAGNOSIS — K8000 Calculus of gallbladder with acute cholecystitis without obstruction: Secondary | ICD-10-CM | POA: Diagnosis not present

## 2024-02-26 DIAGNOSIS — R932 Abnormal findings on diagnostic imaging of liver and biliary tract: Secondary | ICD-10-CM | POA: Diagnosis not present

## 2024-02-26 DIAGNOSIS — K8012 Calculus of gallbladder with acute and chronic cholecystitis without obstruction: Secondary | ICD-10-CM | POA: Diagnosis not present

## 2024-02-26 HISTORY — PX: CHOLECYSTECTOMY: SHX55

## 2024-02-26 LAB — COMPREHENSIVE METABOLIC PANEL
ALT: 13 U/L (ref 0–44)
AST: 15 U/L (ref 15–41)
Albumin: 3.3 g/dL — ABNORMAL LOW (ref 3.5–5.0)
Alkaline Phosphatase: 43 U/L (ref 38–126)
Anion gap: 7 (ref 5–15)
BUN: 7 mg/dL (ref 6–20)
CO2: 25 mmol/L (ref 22–32)
Calcium: 8.8 mg/dL — ABNORMAL LOW (ref 8.9–10.3)
Chloride: 105 mmol/L (ref 98–111)
Creatinine, Ser: 0.83 mg/dL (ref 0.44–1.00)
GFR, Estimated: >60 mL/min (ref >60)
Glucose, Bld: 91 mg/dL (ref 70–99)
Potassium: 3.5 mmol/L (ref 3.5–5.1)
Sodium: 137 mmol/L (ref 135–145)
Total Bilirubin: 0.8 mg/dL (ref 0.0–1.2)
Total Protein: 6.1 g/dL — ABNORMAL LOW (ref 6.5–8.1)

## 2024-02-26 LAB — HIV ANTIBODY (ROUTINE TESTING W REFLEX): HIV Screen 4th Generation wRfx: NONREACTIVE

## 2024-02-26 LAB — CBC
HCT: 37.9 % (ref 36.0–46.0)
Hemoglobin: 12 g/dL (ref 12.0–15.0)
MCH: 30.8 pg (ref 26.0–34.0)
MCHC: 31.7 g/dL (ref 30.0–36.0)
MCV: 97.4 fL (ref 80.0–100.0)
Platelets: 415 10*3/uL — ABNORMAL HIGH (ref 150–400)
RBC: 3.89 MIL/uL (ref 3.87–5.11)
RDW: 13 % (ref 11.5–15.5)
WBC: 9.9 10*3/uL (ref 4.0–10.5)
nRBC: 0 % (ref 0.0–0.2)

## 2024-02-26 LAB — GLUCOSE, CAPILLARY
Glucose-Capillary: 78 mg/dL (ref 70–99)
Glucose-Capillary: 151 mg/dL — ABNORMAL HIGH (ref 70–99)

## 2024-02-26 SURGERY — LAPAROSCOPIC CHOLECYSTECTOMY WITH INTRAOPERATIVE CHOLANGIOGRAM
Anesthesia: General | Site: Abdomen

## 2024-02-26 MED ORDER — BUPIVACAINE LIPOSOME 1.3 % IJ SUSP
INTRAMUSCULAR | Status: AC
  Filled 2024-02-26: qty 20

## 2024-02-26 MED ORDER — OXYCODONE HCL 5 MG PO TABS: 5.0000 mg | ORAL_TABLET | Freq: Once | ORAL | Status: DC | PRN

## 2024-02-26 MED ORDER — FENTANYL CITRATE PF 50 MCG/ML IJ SOSY
PREFILLED_SYRINGE | INTRAMUSCULAR | Status: AC
  Filled 2024-02-26: qty 2

## 2024-02-26 MED ORDER — PROPOFOL 10 MG/ML IV BOLUS
INTRAVENOUS | Status: AC
  Filled 2024-02-26: qty 20

## 2024-02-26 MED ORDER — ACETAMINOPHEN 500 MG PO TABS
1000.0000 mg | ORAL_TABLET | ORAL | Status: AC
  Administered 2024-02-26: 1000 mg via ORAL

## 2024-02-26 MED ORDER — FENTANYL CITRATE (PF) 250 MCG/5ML IJ SOLN
INTRAMUSCULAR | Status: AC
  Filled 2024-02-26: qty 5

## 2024-02-26 MED ORDER — PHENOL 1.4 % MT LIQD: 2.0000 | OROMUCOSAL | Status: DC | PRN

## 2024-02-26 MED ORDER — LACTATED RINGERS IV BOLUS: 1000.0000 mL | Freq: Three times a day (TID) | INTRAVENOUS | Status: DC | PRN

## 2024-02-26 MED ORDER — MENTHOL 3 MG MT LOZG: 1.0000 | LOZENGE | OROMUCOSAL | Status: DC | PRN

## 2024-02-26 MED ORDER — GABAPENTIN 300 MG PO CAPS
300.0000 mg | ORAL_CAPSULE | ORAL | Status: AC
  Administered 2024-02-26: 300 mg via ORAL

## 2024-02-26 MED ORDER — NAPHAZOLINE-GLYCERIN 0.012-0.25 % OP SOLN: 1.0000 [drp] | Freq: Four times a day (QID) | OPHTHALMIC | Status: DC | PRN

## 2024-02-26 MED ORDER — METHOCARBAMOL 500 MG PO TABS
1000.0000 mg | ORAL_TABLET | Freq: Four times a day (QID) | ORAL | Status: DC | PRN
Start: 2024-02-26 — End: 2024-02-26

## 2024-02-26 MED ORDER — SODIUM CHLORIDE 0.9% FLUSH: 3.0000 mL | INTRAVENOUS | Status: DC | PRN

## 2024-02-26 MED ORDER — SODIUM CHLORIDE 0.9 % IR SOLN
Status: DC | PRN
Start: 2024-02-26 — End: 2024-02-26
  Administered 2024-02-26: 1000 mL

## 2024-02-26 MED ORDER — SALINE SPRAY 0.65 % NA SOLN: 1.0000 | Freq: Four times a day (QID) | NASAL | Status: DC | PRN

## 2024-02-26 MED ORDER — CALCIUM POLYCARBOPHIL 625 MG PO TABS
625.0000 mg | ORAL_TABLET | Freq: Two times a day (BID) | ORAL | Status: DC
  Filled 2024-02-26: qty 1

## 2024-02-26 MED ORDER — LACTATED RINGERS IV SOLN: INTRAVENOUS | Status: DC | PRN

## 2024-02-26 MED ORDER — DEXAMETHASONE SODIUM PHOSPHATE 4 MG/ML IJ SOLN
INTRAMUSCULAR | Status: DC | PRN
  Administered 2024-02-26: 5 mg via INTRAVENOUS

## 2024-02-26 MED ORDER — LIDOCAINE HCL (CARDIAC) PF 100 MG/5ML IV SOSY
PREFILLED_SYRINGE | INTRAVENOUS | Status: DC | PRN
Start: 2024-02-26 — End: 2024-02-26
  Administered 2024-02-26: 100 mg via INTRAVENOUS

## 2024-02-26 MED ORDER — SODIUM CHLORIDE (PF) 0.9 % IJ SOLN
INTRAMUSCULAR | Status: DC | PRN
  Administered 2024-02-26: 10 mL

## 2024-02-26 MED ORDER — BUPIVACAINE-EPINEPHRINE (PF) 0.25% -1:200000 IJ SOLN
INTRAMUSCULAR | Status: AC
  Filled 2024-02-26: qty 30

## 2024-02-26 MED ORDER — FENTANYL CITRATE PF 50 MCG/ML IJ SOSY
25.0000 ug | PREFILLED_SYRINGE | INTRAMUSCULAR | Status: DC | PRN
  Administered 2024-02-26 (×2): 50 ug via INTRAVENOUS

## 2024-02-26 MED ORDER — MIDAZOLAM HCL 5 MG/5ML IJ SOLN
INTRAMUSCULAR | Status: DC | PRN
  Administered 2024-02-26: 2 mg via INTRAVENOUS

## 2024-02-26 MED ORDER — ASPIRIN 81 MG PO TBEC: 81.0000 mg | DELAYED_RELEASE_TABLET | Freq: Every day | ORAL | Status: DC

## 2024-02-26 MED ORDER — MAGIC MOUTHWASH: 15.0000 mL | Freq: Four times a day (QID) | ORAL | Status: DC | PRN

## 2024-02-26 MED ORDER — BUPIVACAINE LIPOSOME 1.3 % IJ SUSP: 20.0000 mL | INTRAMUSCULAR | Status: AC

## 2024-02-26 MED ORDER — HYDROCODONE-ACETAMINOPHEN 5-325 MG PO TABS: 1.0000 | ORAL_TABLET | Freq: Four times a day (QID) | ORAL | 0 refills | Status: DC | PRN

## 2024-02-26 MED ORDER — MIDAZOLAM HCL 2 MG/2ML IJ SOLN
INTRAMUSCULAR | Status: AC
  Filled 2024-02-26: qty 2

## 2024-02-26 MED ORDER — PROCHLORPERAZINE MALEATE 5 MG PO TABS
5.0000 mg | ORAL_TABLET | Freq: Four times a day (QID) | ORAL | Status: DC | PRN
  Administered 2024-02-26: 5 mg via ORAL
  Filled 2024-02-26 (×2): qty 2

## 2024-02-26 MED ORDER — ACETAMINOPHEN 500 MG PO TABS: 500.0000 mg | ORAL_TABLET | Freq: Three times a day (TID) | ORAL | Status: DC

## 2024-02-26 MED ORDER — SUGAMMADEX SODIUM 200 MG/2ML IV SOLN
INTRAVENOUS | Status: DC | PRN
  Administered 2024-02-26: 200 mg via INTRAVENOUS

## 2024-02-26 MED ORDER — AMISULPRIDE (ANTIEMETIC) 5 MG/2ML IV SOLN: 10.0000 mg | Freq: Once | INTRAVENOUS | Status: DC | PRN

## 2024-02-26 MED ORDER — PROCHLORPERAZINE MALEATE 5 MG PO TABS: 5.0000 mg | ORAL_TABLET | Freq: Four times a day (QID) | ORAL | 2 refills | Status: DC | PRN

## 2024-02-26 MED ORDER — OXYCODONE HCL 5 MG/5ML PO SOLN: 5.0000 mg | Freq: Once | ORAL | Status: DC | PRN

## 2024-02-26 MED ORDER — LACTATED RINGERS IR SOLN
Status: DC | PRN
  Administered 2024-02-26: 1000 mL

## 2024-02-26 MED ORDER — DEXAMETHASONE SODIUM PHOSPHATE 10 MG/ML IJ SOLN
INTRAMUSCULAR | Status: AC
  Filled 2024-02-26: qty 1

## 2024-02-26 MED ORDER — GABAPENTIN 300 MG PO CAPS
ORAL_CAPSULE | ORAL | Status: AC
  Filled 2024-02-26: qty 1

## 2024-02-26 MED ORDER — SODIUM CHLORIDE 0.9 % IV SOLN: 250.0000 mL | INTRAVENOUS | Status: DC | PRN

## 2024-02-26 MED ORDER — ONDANSETRON HCL 4 MG/2ML IJ SOLN
INTRAMUSCULAR | Status: DC | PRN
  Administered 2024-02-26: 4 mg via INTRAVENOUS

## 2024-02-26 MED ORDER — ZINC SULFATE 220 (50 ZN) MG PO CAPS: 220.0000 mg | ORAL_CAPSULE | Freq: Every day | ORAL | Status: DC

## 2024-02-26 MED ORDER — CELECOXIB 200 MG PO CAPS
ORAL_CAPSULE | ORAL | Status: AC
  Filled 2024-02-26: qty 1

## 2024-02-26 MED ORDER — VITAMIN D 25 MCG (1000 UNIT) PO TABS: 1000.0000 [IU] | ORAL_TABLET | Freq: Every day | ORAL | Status: DC

## 2024-02-26 MED ORDER — ONDANSETRON HCL 4 MG/2ML IJ SOLN
INTRAMUSCULAR | Status: AC
  Filled 2024-02-26: qty 2

## 2024-02-26 MED ORDER — ROCURONIUM BROMIDE 100 MG/10ML IV SOLN
INTRAVENOUS | Status: DC | PRN
  Administered 2024-02-26: 50 mg via INTRAVENOUS

## 2024-02-26 MED ORDER — HYDROCODONE-ACETAMINOPHEN 5-325 MG PO TABS
1.0000 | ORAL_TABLET | ORAL | Status: DC | PRN
  Administered 2024-02-26: 1 via ORAL
  Filled 2024-02-26: qty 1

## 2024-02-26 MED ORDER — FENTANYL CITRATE (PF) 100 MCG/2ML IJ SOLN
INTRAMUSCULAR | Status: DC | PRN
  Administered 2024-02-26: 100 ug via INTRAVENOUS
  Administered 2024-02-26: 50 ug via INTRAVENOUS

## 2024-02-26 MED ORDER — HYDROMORPHONE HCL 1 MG/ML IJ SOLN: 0.5000 mg | INTRAMUSCULAR | Status: DC | PRN

## 2024-02-26 MED ORDER — PRENATAL MULTIVITAMIN CH
1.0000 | ORAL_TABLET | Freq: Every day | ORAL | Status: DC
  Filled 2024-02-26: qty 1

## 2024-02-26 MED ORDER — PROPOFOL 10 MG/ML IV BOLUS
INTRAVENOUS | Status: DC | PRN
  Administered 2024-02-26: 150 mg via INTRAVENOUS

## 2024-02-26 MED ORDER — ALUM & MAG HYDROXIDE-SIMETH 200-200-20 MG/5ML PO SUSP: 30.0000 mL | Freq: Four times a day (QID) | ORAL | Status: DC | PRN

## 2024-02-26 MED ORDER — ACETAMINOPHEN 10 MG/ML IV SOLN: 1000.0000 mg | Freq: Once | INTRAVENOUS | Status: DC | PRN

## 2024-02-26 MED ORDER — CELECOXIB 200 MG PO CAPS
400.0000 mg | ORAL_CAPSULE | ORAL | Status: AC
  Administered 2024-02-26: 400 mg via ORAL

## 2024-02-26 MED ORDER — BUPIVACAINE-EPINEPHRINE (PF) 0.25% -1:200000 IJ SOLN
INTRAMUSCULAR | Status: DC | PRN
  Administered 2024-02-26: 50 mL

## 2024-02-26 MED ORDER — SODIUM CHLORIDE 0.9% FLUSH: 3.0000 mL | Freq: Two times a day (BID) | INTRAVENOUS | Status: DC

## 2024-02-26 MED ORDER — ACETAMINOPHEN 500 MG PO TABS
ORAL_TABLET | ORAL | Status: AC
  Filled 2024-02-26: qty 2

## 2024-02-26 MED ORDER — METHOCARBAMOL 1000 MG/10ML IJ SOLN: 1000.0000 mg | Freq: Four times a day (QID) | INTRAMUSCULAR | Status: DC | PRN

## 2024-02-26 MED ORDER — SODIUM CHLORIDE 0.9 % IV SOLN
INTRAVENOUS | Status: AC
  Filled 2024-02-26: qty 20

## 2024-02-26 SURGICAL SUPPLY — 40 items
APPLIER CLIP 5 13 M/L LIGAMAX5 (MISCELLANEOUS) IMPLANT
APPLIER CLIP ROT 10 11.4 M/L (STAPLE) IMPLANT
BAG COUNTER SPONGE SURGICOUNT (BAG) ×2 IMPLANT
CABLE HIGH FREQUENCY MONO STRZ (ELECTRODE) IMPLANT
CLIP APPLIE 5 13 M/L LIGAMAX5 (MISCELLANEOUS) IMPLANT
CLIP APPLIE ROT 10 11.4 M/L (STAPLE) IMPLANT
COVER MAYO STAND XLG (MISCELLANEOUS) ×2 IMPLANT
COVER SURGICAL LIGHT HANDLE (MISCELLANEOUS) ×2 IMPLANT
DRAPE 3/4 80X56 (DRAPES) IMPLANT
DRAPE C-ARM 42X120 X-RAY (DRAPES) ×2 IMPLANT
DRAPE UTILITY XL STRL (DRAPES) ×2 IMPLANT
DRAPE WARM FLUID 44X44 (DRAPES) ×2 IMPLANT
DRSG TEGADERM 2-3/8X2-3/4 SM (GAUZE/BANDAGES/DRESSINGS) ×4 IMPLANT
DRSG TEGADERM 4X4.75 (GAUZE/BANDAGES/DRESSINGS) IMPLANT
DRSG TEGADERM 6X8 (GAUZE/BANDAGES/DRESSINGS) ×2 IMPLANT
ELECT REM PT RETURN 15FT ADLT (MISCELLANEOUS) ×2 IMPLANT
ENDOLOOP SUT PDS II 0 18 (SUTURE) IMPLANT
GAUZE SPONGE 2X2 8PLY STRL LF (GAUZE/BANDAGES/DRESSINGS) IMPLANT
GLOVE ECLIPSE 8.0 STRL XLNG CF (GLOVE) ×2 IMPLANT
GLOVE INDICATOR 8.0 STRL GRN (GLOVE) ×2 IMPLANT
GOWN STRL REUS W/ TWL XL LVL3 (GOWN DISPOSABLE) ×4 IMPLANT
IRRIG SUCT STRYKERFLOW 2 WTIP (MISCELLANEOUS) ×1 IMPLANT
IRRIGATION SUCT STRKRFLW 2 WTP (MISCELLANEOUS) ×2 IMPLANT
KIT BASIN OR (CUSTOM PROCEDURE TRAY) ×2 IMPLANT
KIT TURNOVER KIT A (KITS) IMPLANT
PENCIL SMOKE EVACUATOR (MISCELLANEOUS) IMPLANT
POUCH RETRIEVAL ECOSAC 10 (ENDOMECHANICALS) ×2 IMPLANT
SCISSORS LAP 5X35 DISP (ENDOMECHANICALS) ×2 IMPLANT
SET CHOLANGIOGRAPH MIX (MISCELLANEOUS) ×2 IMPLANT
SET TUBE SMOKE EVAC HIGH FLOW (TUBING) ×2 IMPLANT
SLEEVE ADV FIXATION 5X100MM (TROCAR) ×2 IMPLANT
SPIKE FLUID TRANSFER (MISCELLANEOUS) ×2 IMPLANT
SUT MNCRL AB 4-0 PS2 18 (SUTURE) ×2 IMPLANT
SUT PDS AB 1 CT 36 (SUTURE) ×2 IMPLANT
SYR 20ML LL LF (SYRINGE) ×2 IMPLANT
TOWEL OR 17X26 10 PK STRL BLUE (TOWEL DISPOSABLE) ×2 IMPLANT
TRAY LAPAROSCOPIC (CUSTOM PROCEDURE TRAY) ×2 IMPLANT
TROCAR ADV FIXATION 12X100MM (TROCAR) ×2 IMPLANT
TROCAR ADV FIXATION 5X100MM (TROCAR) ×2 IMPLANT
TROCAR Z-THREAD OPTICAL 5X100M (TROCAR) ×2 IMPLANT

## 2024-02-26 NOTE — Discharge Instructions (Signed)
 ################################################################  LAPAROSCOPIC SURGERY: POST OP INSTRUCTIONS  ######################################################################  EAT Gradually transition to a high fiber diet with a fiber supplement over the next few weeks after discharge.  Start with a pureed / full liquid diet (see below)  WALK Walk an hour a day.  Control your pain to do that.    CONTROL PAIN Control pain so that you can walk, sleep, tolerate sneezing/coughing, go up/down stairs.  HAVE A BOWEL MOVEMENT DAILY Keep your bowels regular to avoid problems.  OK to try a laxative to override constipation.  OK to use an antidairrheal to slow down diarrhea.  Call if not better after 2 tries  CALL IF YOU HAVE PROBLEMS/CONCERNS Call if you are still struggling despite following these instructions. Call if you have concerns not answered by these instructions  ######################################################################    DIET: Follow a light bland diet & liquids the first 24 hours after arrival home, such as soup, liquids, starches, etc.  Be sure to drink plenty of fluids.  Quickly advance to a usual solid diet within a few days.  Avoid fast food or heavy meals as your are more likely to get nauseated or have irregular bowels.  A low-fat, high-fiber diet for the rest of your life is ideal.  Take your usually prescribed home medications unless otherwise directed. Blood thinners:  You can restart any strong blood thinners after the second postoperative day  for example: COUMADIN (warfarin), XERELTO (rivaroxaban), ELIQUIS (apixaban), PLAVIX (clopidigrel), BRILINTA (ticagrelor), EFFIENT (prasugrel), PRADAXA (dabigatran), etc  Continue aspirin before & after surgery..     Some oozing/bleeding the first 1-2 weeks is common but should taper down & be small volume.    If you are passing many large clots or having uncontrolling bleeding, call your surgeon  PAIN  CONTROL: Pain is best controlled by a usual combination of three different methods TOGETHER: Ice/Heat Over the counter pain medication Prescription pain medication Most patients will experience some swelling and bruising around the incisions.  Ice packs or heating pads (30-60 minutes up to 6 times a day) will help. Use ice for the first few days to help decrease swelling and bruising, then switch to heat to help relax tight/sore spots and speed recovery.  Some people prefer to use ice alone, heat alone, alternating between ice & heat.  Experiment to what works for you.  Swelling and bruising can take several weeks to resolve.   It is helpful to take an over-the-counter pain medication regularly for the first few weeks.  Choose one of the following that works best for you: Naproxen (Aleve, etc)  Two 220mg  tabs twice a day Ibuprofen (Advil, etc) Three 200mg  tabs four times a day (every meal & bedtime) Acetaminophen (Tylenol, etc) 500-650mg  four times a day (every meal & bedtime) A  prescription for pain medication (such as oxycodone, hydrocodone, tramadol, gabapentin, methocarbamol, etc) should be given to you upon discharge.  Take your pain medication as prescribed.  If you are having problems/concerns with the prescription medicine (does not control pain, nausea, vomiting, rash, itching, etc), please call us [phone] to see if we need to switch you to a different pain medicine that will work better for you and/or control your side effect better. If you need a refill on your pain medication, please give [place] 48 hour notice.  contact your pharmacy.  They will contact our office to request authorization. Prescriptions will not be filled after 5 pm or on week-ends  AVOID GETTING CONSTIPATED.   a.  Between the surgery and the pain medications, it is common to experience some constipation.  b.  Drink plenty of liquids c   ake a fiber supplement 2 times day (such as Metamucil, Citrucel, FiberCon,  MiraLax, etc) to have a bowel movement every day. d.  If you have not had a BM by 2 days after surgery: -drink liquids only until you have a bowel movement - take MiraLAX 2 doses every 2 hours until you have a bowel movement   Watch out for diarrhea.   If you have many loose bowel movements, simplify your diet to bland foods & liquids for a few days.   Stop any stool softeners and decrease your fiber supplement.   Switching to mild anti-diarrheal medications (Kayopectate, Pepto Bismol) can help.   If this worsens or does not improve, please call us.  Wash / shower every day.  You may shower over the dressings as they are waterproof.  Continue to shower over incision(s) after the dressing is off.  It is good for closed incisions and even open wounds to be washed every day.  Shower every day.  Short baths are fine.  Wash the incisions and wounds clean with soap & water.    You may leave closed incisions open to air if it is dry.   You may cover the incision with clean gauze & replace it after your daily shower for comfort.  TEGADERM:  You have clear gauze band-aid dressings over your closed incision(s).  Remove the dressings 2 days after surgery = 3/4 Tuesday.    ACTIVITIES as tolerated:   You may resume regular (light) daily activities beginning the next day--such as daily self-care, walking, climbing stairs--gradually increasing activities as tolerated.  If you can walk 30 minutes without difficulty, it is safe to try more intense activity such as jogging, treadmill, bicycling, low-impact aerobics, swimming, etc. Save the most intensive and strenuous activity for last such as sit-ups, heavy lifting, contact sports, etc  Refrain from any heavy lifting or straining until you are off narcotics for pain control.   DO NOT PUSH THROUGH PAIN.  Let pain be your guide: If it hurts to do something, don't do it.  Pain is your body warning you to avoid that activity for another week until the pain goes  down. You may drive when you are no longer taking prescription pain medication, you can comfortably wear a seatbelt, and you can safely maneuver your car and apply brakes. You may have sexual intercourse when it is comfortable.  FOLLOW UP in our office Please call [facility] at [phone] to set up an appointment to see your surgeon in the office for a follow-up appointment approximately 2-3 weeks after your surgery. Make sure that you call for this appointment the day you arrive home to insure a convenient appointment time.  10. IF YOU HAVE DISABILITY OR FAMILY LEAVE FORMS, BRING THEM TO THE OFFICE FOR PROCESSING.  DO NOT GIVE THEM TO YOUR DOCTOR.   WHEN TO CALL us [phone]: Poor pain control Reactions / problems with new medications (rash/itching, nausea, etc)  Fever over 101.5 F (38.5 C) Inability to urinate Nausea and/or vomiting Worsening swelling or bruising Continued bleeding from incision. Increased pain, redness, or drainage from the incision   The clinic staff is available to answer your questions during regular business hours (8:30am-5pm).  Please don't hesitate to call and ask to speak to one of our nurses for clinical concerns.   If you have  a medical emergency, go to the nearest emergency room or call 911.  A surgeon from [facility] is always on call at the [facility], [place] [address], [address], [place], [place]  [address] ? MAIN: [phone] ? TOLL FREE: [phone] ?  FAX [phone] [email or web address]  ##############################################################

## 2024-02-26 NOTE — Anesthesia Postprocedure Evaluation (Signed)
 Anesthesia Post Note  Patient: Ann Frederick  Procedure(s) Performed: LAPAROSCOPIC CHOLECYSTECTOMY  INTRAOPERATIVE CHOLANGIOGRAM (Abdomen)     Patient location during evaluation: PACU Anesthesia Type: General Level of consciousness: awake Pain management: pain level controlled Vital Signs Assessment: post-procedure vital signs reviewed and stable Respiratory status: spontaneous breathing, nonlabored ventilation and respiratory function stable Cardiovascular status: blood pressure returned to baseline and stable Postop Assessment: no apparent nausea or vomiting Anesthetic complications: no   No notable events documented.  Last Vitals:  Vitals:   02/26/24 1200 02/26/24 1219  BP: (!) 150/85   Pulse: 86 85  Resp: 19 13  Temp:    SpO2: 96% 100%    Last Pain:  Vitals:   02/26/24 1219  TempSrc:   PainSc: Asleep                 Linton Rump

## 2024-02-26 NOTE — Anesthesia Preprocedure Evaluation (Addendum)
 Anesthesia Evaluation  Patient identified by MRN, date of birth, ID band Patient awake    Reviewed: Allergy & Precautions, NPO status , Patient's Chart, lab work & pertinent test results  History of Anesthesia Complications Negative for: history of anesthetic complications  Airway Mallampati: I  TM Distance: >3 FB Neck ROM: Full   Comment: Previous grade I view with MAC 3, easy mask Dental  (+) Dental Advisory Given   Pulmonary neg shortness of breath, sleep apnea (does not use CPAP) , neg COPD, neg recent URI   Pulmonary exam normal breath sounds clear to auscultation       Cardiovascular hypertension, (-) angina (-) Past MI, (-) Cardiac Stents and (-) CABG (-) dysrhythmias + Valvular Problems/Murmurs  Rhythm:Regular Rate:Normal  HLD   Neuro/Psych  Headaches, neg Seizures PSYCHIATRIC DISORDERS Anxiety Depression    S/p ACDF  Neuromuscular disease (sciatica)    GI/Hepatic Neg liver ROS,GERD  Medicated,,Acute cholecystitis   Endo/Other  diabetes, Type 2, Oral Hypoglycemic Agents  Metabolic syndrome  Renal/GU negative Renal ROS     Musculoskeletal  (+) Arthritis ,    Abdominal   Peds  Hematology  (+) Blood dyscrasia, anemia Lab Results      Component                Value               Date                      WBC                      9.9                 02/26/2024                HGB                      12.0                02/26/2024                HCT                      37.9                02/26/2024                MCV                      97.4                02/26/2024                PLT                      415 (H)             02/26/2024              Anesthesia Other Findings Last Wegovy: 02/19/2024  Reproductive/Obstetrics                             Anesthesia Physical Anesthesia Plan  ASA: 2  Anesthesia Plan: General   Post-op Pain Management:    Induction:  Intravenous  PONV Risk Score and Plan: 3 and Ondansetron, Dexamethasone and Treatment may  vary due to age or medical condition  Airway Management Planned: Oral ETT  Additional Equipment:   Intra-op Plan:   Post-operative Plan: Extubation in OR  Informed Consent: I have reviewed the patients History and Physical, chart, labs and discussed the procedure including the risks, benefits and alternatives for the proposed anesthesia with the patient or authorized representative who has indicated his/her understanding and acceptance.     Dental advisory given  Plan Discussed with: CRNA and Anesthesiologist  Anesthesia Plan Comments: (Risks of general anesthesia discussed including, but not limited to, sore throat, hoarse voice, chipped/damaged teeth, injury to vocal cords, nausea and vomiting, allergic reactions, lung infection, heart attack, stroke, and death. All questions answered. )        Anesthesia Quick Evaluation

## 2024-02-26 NOTE — Op Note (Signed)
 02/26/2024  PATIENT:  Ann Frederick  53 y.o. female  Patient Care Team: Saguier, Kateri Mc as PCP - General (Internal Medicine) Jodelle Red, MD as PCP - Cardiology (Cardiology) Karie Soda, MD as Consulting Physician (General Surgery)  PRE-OPERATIVE DIAGNOSIS:    Acute on Chronic Calculus Cholecystitis  POST-OPERATIVE DIAGNOSIS:   Acute on Chronic Calculus Cholecystitis  PROCEDURE:  SINGLE SITE Laparoscopic cholecystectomy with intraoperative cholangiogram (CPT code 16109)  SURGEON:  Ardeth Sportsman, MD, FACS.  ASSISTANT:  (n/a)   ANESTHESIA:  General endotracheal intubation anesthesia (GETA) and Regional TRANSVERSUS ABDOMINIS PLANE (TAP) nerve block -BILATERAL for perioperative & postoperative pain control at the level of the transverse abdominis & preperitoneal spaces along the flank at the anterior axillary line, from subcostal ridge to iliac crest under laparoscopic guidance provided with liposomal bupivacaine (Experel) 20mL mixed with 30 mL of bupivicaine 0.25% with epinephrine  Estimated Blood Loss (EBL):   Total I/O In: 100 [IV Piggyback:100] Out: - .   (See anesthesia record)  Delay start of Pharmacological VTE agent (>24hrs) due to concerns of significant anemia, surgical blood loss, or risk of bleeding?:  no  DRAINS: (None)  SPECIMEN:  Gallbladder  DISPOSITION OF SPECIMEN:  Pathology  COUNTS:  Sponge, needle, & instrument counts CORRECT  PLAN OF CARE:  Discharge home from floor if meets goals tonight versus tomorrow a.m.  PATIENT DISPOSITION:  PACU - hemodynamically stable.  INDICATION: 53 year old woman with intermittent upper abdominal pain and gallstones suspicious for chronic cholecystitis.  Had an attack that would go away and felt worse.  Admitted with antibiotics.  Recommendation made for cholecystectomy  The anatomy & physiology of hepatobiliary & pancreatic function was discussed.  The pathophysiology of gallbladder dysfunction was  discussed.  Natural history risks without surgery was discussed.   I feel the risks of no intervention will lead to serious problems that outweigh the operative risks; therefore, I recommended cholecystectomy to remove the pathology.  I explained laparoscopic techniques with possible need for an open approach.  Probable cholangiogram to evaluate the bilary tract was explained as well.    Risks such as bleeding, infection, abscess, leak, injury to other organs, need for further treatment, heart attack, death, and other risks were discussed.  I noted a good likelihood this will help address the problem.  Possibility that this will not correct all abdominal symptoms was explained.  Goals of post-operative recovery were discussed as well.  We will work to minimize complications.  An educational handout further explaining the pathology and treatment options was given as well.  Questions were answered.  The patient expresses understanding & wishes to proceed with surgery.  OR FINDINGS: Thickened dilated gallbladder with chronic changes consistent with chronic cholecystitis.  Edema and swelling suspicious for acute cholecystitis as well.  Small spherical stones with fibrotic infundibulum/cystic duct valve  Cholangiogram FE showing classic biliary anatomy without leak obstruction.  Liver: normal  DESCRIPTION:   The patient was identified & brought in the operating room. The patient was positioned supine with arms tucked. SCDs were active during the entire case. The patient underwent general anesthesia without any difficulty.  The abdomen was prepped and draped in a sterile fashion. A Surgical Timeout confirmed our plan.  I made a transverse curvilinear incision through the superior umbilical fold.  I placed a 5mm long port through the supraumbilical fascia using a modified Hassan cutdown technique with umbilical stalk fascial countertraction. I began carbon dioxide insufflation.  No change in end tidal CO2  measurement.  Camera inspection revealed no injury. There were no adhesions to the anterior abdominal wall supraumbilically.  I proceeded to continue with single site technique. I placed a #5 port in left upper aspect of the wound. I placed a 5 mm atraumatic grasper in the right inferior aspect of the wound.  I turned attention to the right upper quadrant.  Operative findings as noted above.  The gallbladder fundus was elevated cephalad. I freed adhesions to the ventral surface of the gallbladder off carefully.  I freed the peritoneal coverings between the gallbladder and the liver on the posteriolateral and anteriomedial walls. I alternated between Harmonic & blunt [place] dissection to help get a good critical view of the cystic artery and cystic duct.  Gallbladder was somewhat ischemic causing some disruption in spillage of bile and 2 stones that I could see and isolated and controlled.  Aspirated most bile to allow further dissection.  Did further dissection to free 80% of the gallbladder off the liver bed to get a good critical view of the infundibulum and cystic duct. I dissected out the cystic artery; and, after getting a good 360 view, ligated the anterior & posterior branches of the cystic artery close on the infundibulum using the Harmonic ultrasonic dissection.  I skeletonized the cystic duct.  I placed a clip on the infundibulum. I did a partial cystic duct-otomy and ensured patency. I placed a 5 [place] cholangiocatheter through a puncture site at the right subcostal ridge of the abdominal wall and directed it into the cystic duct.  We ran a cholangiogram with dilute radio-opaque contrast and continuous fluoroscopy. Contrast flowed from a side branch consistent with cystic duct cannulization. Contrast flowed up the common hepatic duct into the right and left intrahepatic chains out to secondary radicals. Contrast flowed down the common bile duct easily across the normal ampulla into the duodenum.   No evidence of biliary dilatation, stricture, filling defect, nor contrast extravasation.This was consistent with a normal cholangiogram.  I removed the cholangiocatheter.  Freed gallbladder from its remaining attachments on liver bed.  Because the cystic was somewhat edematous and thickened I ligated the cystic using a 0 PDS Endoloop, last swelling around the gallbladder and ligating at the proximal cystic duct/common bile duct junction to good result.  I then placed clips on the cystic duct x4.  I completed cystic duct transection.  I ensured hemostasis on the gallbladder fossa of the liver and elsewhere. I inspected the rest of the abdomen & detected no injury nor bleeding elsewhere.  I placed the gallbladder inside and EcoSac and removed the gallbladder out the supraumbilical fascia. I closed the fascia transversely using  #1 PDS interrupted stitches. I closed the skin using 4-0 monocryl stitch.  Sterile dressing was applied. The patient was extubated & arrived in the PACU in stable condition..  I had discussed postoperative care with the patient in the holding area. I discussed operative findings, updated the patient's status, discussed probable steps to recovery, and gave postoperative recommendations to the patient's spouse, Yuliya Nova.  Recommendations were made.  Questions were answered.  He expressed understanding & appreciation.  Ardeth Sportsman, M.D., F.A.C.S. Gastrointestinal and Minimally Invasive Surgery [facility] [address], [address] [place], [place] [address] [phone] Main / Paging  02/26/2024 11:36 AM

## 2024-02-26 NOTE — Interval H&P Note (Signed)
 History and Physical Interval Note:  02/26/2024 9:03 AM  Ann Frederick  has presented today for surgery, with the diagnosis of Acute Cholecystitis.  The various methods of treatment have been discussed with the patient and family. After consideration of risks, benefits and other options for treatment, the patient has consented to  Procedure(s): LAPAROSCOPIC CHOLECYSTECTOMY WITH POSSIBLE INTRAOPERATIVE CHOLANGIOGRAM (N/A) as a surgical intervention.  The patient's history has been reviewed, patient examined, no change in status, stable for surgery.  I have reviewed the patient's chart and labs.  Questions were answered to the patient's satisfaction.    I have re-reviewed the the patient's records, history, medications, and allergies.  I have re-examined the patient.  I again discussed intraoperative plans and goals of post-operative recovery.  The patient agrees to proceed.  Ann Frederick  November 09, 1971 [id number]  Patient Care Team: Ann Frederick as PCP - General (Internal Medicine) Ann Red, MD as PCP - Cardiology (Cardiology)  Patient Active Problem List   Diagnosis Date Noted   Acute cholecystitis 02/25/2024   Cholecystitis 02/25/2024   Left hip pain 08/27/2019   Class 1 obesity due to excess calories without serious comorbidity with body mass index (BMI) of 34.0 to 34.9 in adult 11/28/2018   Heart palpitations 11/28/2018   OSA (obstructive sleep apnea) 09/14/2018   Neck pain 04/26/2018   Wellness examination 03/08/2016   S/P laparoscopic assisted vaginal hysterectomy (LAVH) 02/23/2016   Pink eye 06/19/2015   Pain of right thumb 06/19/2015   Sinusitis, acute frontal 02/05/2015   Headache around the eyes 02/05/2015   Back pain 11/01/2014   Routine general medical examination at a health care facility 10/30/2013   Vesicular rash 09/07/2013   Sinusitis 02/02/2013   Viral pharyngitis 03/03/2012   Flank pain 08/13/2011   Inguinal adenopathy 08/13/2011   Dysphagia  04/20/2011   GERD 01/29/2011   FATIGUE 01/29/2011   SCIATICA, BILATERAL 06/18/2010   LEG PAIN, BILATERAL 06/06/2010   HIP PAIN, LEFT 04/01/2010   MASS, LOCALIZED, SUPERFICIAL 03/05/2009   ACNE VULGARIS 07/11/2007   MURMUR 07/11/2007    Past Medical History:  Diagnosis Date   Acne    Anemia    history of   Anxiety    Arthritis    Depression    GERD (gastroesophageal reflux disease)    worse while pregnant   Headache    Migraines   Heart murmur    ? heart murmur in past   History of blood transfusion 2013   Hypertension    Metabolic syndrome     Past Surgical History:  Procedure Laterality Date   ANTERIOR FUSION CERVICAL SPINE  2018   CESAREAN SECTION     x 2   CESAREAN SECTION  09/29/2012   Procedure: CESAREAN SECTION;  Surgeon: Turner Daniels, MD;  Location: [facility];  Service: Obstetrics;  Laterality: N/A;  Repeat edc 10/12/12/REQUEST;Chassity,Dee,Colleen   COLONOSCOPY     ESOPHAGOGASTRODUODENOSCOPY  normal    7/12   JOINT REPLACEMENT Bilateral    LAPAROSCOPIC VAGINAL HYSTERECTOMY WITH SALPINGECTOMY Bilateral 02/23/2016   Procedure: LAPAROSCOPIC ASSISTED VAGINAL HYSTERECTOMY WITH bilateral SALPINGECTOMY, right oophorectomy. laporotic repair of incidental cystotomy.;  Surgeon: Candice Camp, MD;  Location: [facility];  Service: Gynecology;  Laterality: Bilateral;   MOUTH SURGERY      Social History   Socioeconomic History   Marital status: Married    Spouse name: Not on file   Number of children: 2   Years of education: Not on file  Highest education level: Master's degree (e.g., MA, MS, MEng, MEd, MSW, MBA)  Occupational History   Not on file  Tobacco Use   Smoking status: Never   Smokeless tobacco: Never  Vaping Use   Vaping status: Never Used  Substance and Sexual Activity   Alcohol use: Yes    Comment: 1 glass of wine per week prior to preg   Drug use: No   Sexual activity: Yes  Other Topics Concern   Not on file  Social History Narrative   Not on file    Social Drivers of Health   Financial Resource Strain: Low Risk  (01/06/2024)   Overall Financial Resource Strain (CARDIA)    Difficulty of Paying Living Expenses: Not hard at all  Food Insecurity: Patient Declined (02/25/2024)   Hunger Vital Sign    Worried About Running Out of Food in the Last Year: Patient declined    Ran Out of Food in the Last Year: Patient declined  Transportation Needs: Patient Declined (02/25/2024)   PRAPARE - Administrator, Civil Service (Medical): Patient declined    Lack of Transportation (Non-Medical): Patient declined  Physical Activity: Insufficiently Active (01/06/2024)   Exercise Vital Sign    Days of Exercise per Week: 2 days    Minutes of Exercise per Session: 30 min  Stress: Stress Concern Present (01/06/2024)   [employer] of Occupational Health - Occupational Stress Questionnaire    Feeling of Stress : To some extent  Social Connections: Patient Declined (02/25/2024)   Social Connection and Isolation Panel [NHANES]    Frequency of Communication with Friends and Family: Patient declined    Frequency of Social Gatherings with Friends and Family: Patient declined    Attends Religious Services: Patient declined    Database administrator or Organizations: Patient declined    Attends Banker Meetings: Patient declined    Marital Status: Patient declined  Intimate Partner Violence: Patient Declined (02/25/2024)   Humiliation, Afraid, Rape, and Kick questionnaire    Fear of Current or Ex-Partner: Patient declined    Emotionally Abused: Patient declined    Physically Abused: Patient declined    Sexually Abused: Patient declined    Family History  Problem Relation Age of Onset   Depression Mother    Hypertension Mother    Diabetes Mother    Obesity Mother    Thyroid disease Mother    Cancer Father        ? lung CA   Kidney disease Father    Hydrocephalus Sister    Colon cancer Neg Hx    Esophageal cancer Neg Hx     Liver cancer Neg Hx    Pancreatic cancer Neg Hx    Rectal cancer Neg Hx    Stomach cancer Neg Hx     Medications Prior to Admission  Medication Sig Dispense Refill Last Dose/Taking   ALPRAZolam (XANAX) 0.5 MG tablet Take 0.5 mg by mouth 3 (three) times daily as needed for anxiety.   Taking As Needed   aspirin EC 81 MG tablet Take 81 mg by mouth in the morning. Swallow whole.   02/23/2024   buPROPion (WELLBUTRIN XL) 300 MG 24 hr tablet Take 300 mg by mouth every morning.   02/23/2024   busPIRone (BUSPAR) 15 MG tablet TAKE 1 TABLET BY MOUTH TWICE A DAY (Patient taking differently: Take 15 mg by mouth in the morning.) 180 tablet 2 02/23/2024   Cholecalciferol (VITAMIN D3) 1000 units CAPS Take 1,000 Units  by mouth daily.   Past Week   ezetimibe (ZETIA) 10 MG tablet Take 1 tablet (10 mg total) by mouth daily. 90 tablet 3 02/23/2024   Ferrous Sulfate (SLOW FE PO) Take 1 tablet by mouth daily with breakfast.   Past Week   LYSINE PO Take 1 capsule by mouth daily.   02/23/2024   metFORMIN (GLUCOPHAGE) 500 MG tablet TAKE 1 TABLET BY MOUTH TWICE A DAY WITH FOOD (Patient taking differently: Take 500 mg by mouth daily with breakfast.) 180 tablet 0 02/23/2024   omeprazole (PRILOSEC) 20 MG capsule Take 1 capsule (20 mg total) by mouth daily. (Patient taking differently: Take 20 mg by mouth daily as needed (for heartburn).) 30 capsule 0 Taking Differently   ondansetron (ZOFRAN-ODT) 4 MG disintegrating tablet Take 1 tablet (4 mg total) by mouth every 8 (eight) hours as needed. (Patient taking differently: Take 4 mg by mouth every 8 (eight) hours as needed for nausea or vomiting (dissolve orally).) 8 tablet 0 Taking Differently   Prenatal Vit-Fe Fumarate-FA (PRENATAL MULTIVITAMIN) TABS tablet Take 1 tablet by mouth daily at 12 noon.   02/23/2024   Semaglutide-Weight Management (WEGOVY) 0.25 MG/0.5ML SOAJ INJECT 0.25 MG SUBCUTANEOUSLY ONE TIME PER WEEK (Patient taking differently: Inject 0.25 mg into the skin every  Sunday.) 0.5 mL 1 02/19/2024   Specialty Vitamins Products (ADVANCED COLLAGEN PO) Take 1 tablet by mouth daily.   Past Week   Zinc 50 MG TABS Take 50 mg by mouth daily.   02/23/2024   zolpidem (AMBIEN) 10 MG tablet Take 5-10 mg by mouth at bedtime as needed for sleep.   Taking As Needed   HYDROcodone-acetaminophen (NORCO/VICODIN) 5-325 MG tablet Take 1 tablet by mouth every 6 (six) hours as needed for severe pain (pain score 7-10). (Patient not taking: Reported on 02/25/2024) 5 tablet 0 Not Taking   Insulin Pen Needle (B-D ULTRAFINE III SHORT PEN) 31G X 8 MM MISC USE AS DIRECTED TO INJECT SAXENDA 100 each 3     Current Facility-Administered Medications  Medication Dose Route Frequency Provider Last Rate Last Admin   acetaminophen (TYLENOL) tablet 1,000 mg  1,000 mg Oral On Call to OR Karie Soda, MD       [facility]] ALPRAZolam Prudy Feeler) tablet 0.5 mg  0.5 mg Oral TID PRN Romie Levee, MD       bupivacaine liposome (EXPAREL) 1.3 % injection 266 mg  20 mL Infiltration On Call to OR Karie Soda, MD       [facility] Hold] buPROPion (WELLBUTRIN XL) 24 hr tablet 300 mg  300 mg Oral q morning Romie Levee, MD       [facility] Hold] busPIRone (BUSPAR) tablet 15 mg  15 mg Oral BID Romie Levee, MD   15 mg at 02/25/24 2107   [facility] Hold] cefTRIAXone (ROCEPHIN) 2 g in sodium chloride 0.9 % 100 mL IVPB  2 g Intravenous Q24H Romie Levee, MD       celecoxib (CELEBREX) capsule 400 mg  400 mg Oral On Call to OR Karie Soda, MD       dextrose 5 % and 0.45 % NaCl with KCl 20 mEq/L infusion   Intravenous Continuous Romie Levee, MD   Stopped at 02/26/24 0454   [[facility] Hold] diphenhydrAMINE (BENADRYL) 12.5 MG/5ML elixir 12.5 mg  12.5 mg Oral Q6H PRN Romie Levee, MD       Or   [facility]] diphenhydrAMINE (BENADRYL) injection 12.5 mg  12.5 mg Intravenous Q6H PRN Romie Levee, MD       [  [facility] Hold] enoxaparin (LOVENOX) injection 40 mg  40 mg Subcutaneous Q24H Romie Levee, MD       [facility] Hold] ezetimibe (ZETIA) tablet  10 mg  10 mg Oral Daily Romie Levee, MD   10 mg at 02/25/24 1950   gabapentin (NEURONTIN) capsule 300 mg  300 mg Oral On Call to OR Karie Soda, MD       [facility] Hold] insulin aspart (novoLOG) injection 0-15 Units  0-15 Units Subcutaneous TID WC Romie Levee, MD       [facility] Hold] metFORMIN (GLUCOPHAGE) tablet 500 mg  500 mg Oral BID WC Romie Levee, MD       [facility] Hold] morphine (PF) 2 MG/ML injection 2 mg  2 mg Intravenous Q2H PRN Romie Levee, MD       [facility] Hold] Oral care mouth rinse  15 mL Mouth Rinse PRN Romie Levee, MD       [facility] Hold] oxyCODONE (Oxy IR/ROXICODONE) immediate release tablet 5-10 mg  5-10 mg Oral Q4H PRN Romie Levee, MD       [facility] Hold] pantoprazole (PROTONIX) EC tablet 40 mg  40 mg Oral Daily Romie Levee, MD   40 mg at 02/25/24 1950   [[facility] Hold] prochlorperazine (COMPAZINE) injection 10 mg  10 mg Intravenous Q4H PRN Romie Levee, MD   10 mg at 02/25/24 2005   [[facility] Hold] promethazine (PHENERGAN) 12.5 mg in sodium chloride 0.9 % 50 mL IVPB  12.5 mg Intravenous Q6H PRN Romie Levee, MD       [facility]] senna [facility]) tablet 8.6 mg  1 tablet Oral BID Romie Levee, MD   8.6 mg at 02/25/24 2107   [facility] Hold] simethicone (MYLICON) chewable tablet 40 mg  40 mg Oral Q6H PRN Romie Levee, MD       [facility] Hold] zolpidem [facility]) tablet 5 mg  5 mg Oral QHS PRN Romie Levee, MD         Allergies  Allergen Reactions   Azithromycin Diarrhea and Nausea And Vomiting   Dilaudid [Hydromorphone] Nausea Only    BP 133/83 (BP Location: Left Arm)   Pulse 66   Temp 98.1 F (36.7 C)   Resp 12   Ht 5\' 2"  (1.575 m)   Wt 74.4 kg   LMP 02/11/2016 (Exact Date)   SpO2 100%   BMI 30.00 kg/m   Labs: Results for orders placed or performed during the hospital encounter of 02/25/24 (from the past 48 hours)  CBC with Differential     Status: Abnormal   Collection Time: 02/25/24  3:55 PM  Result Value Ref Range   WBC 12.0 (H) 4.0 - 10.5 K/uL   RBC 4.15 3.87 - 5.11 MIL/uL    Hemoglobin 12.9 12.0 - 15.0 g/dL   HCT 08.6 57.8 - 46.9 %   MCV 93.0 80.0 - 100.0 fL   MCH 31.1 26.0 - 34.0 pg   MCHC 33.4 30.0 - 36.0 g/dL   RDW 62.9 52.8 - 41.3 %   Platelets 458 (H) 150 - 400 K/uL   nRBC 0.0 0.0 - 0.2 %   Neutrophils Relative % 66 %   Neutro Abs 8.1 (H) 1.7 - 7.7 K/uL   Lymphocytes Relative 24 %   Lymphs Abs 2.9 0.7 - 4.0 K/uL   Monocytes Relative 7 %   Monocytes Absolute 0.8 0.1 - 1.0 K/uL   Eosinophils Relative 2 %   Eosinophils Absolute 0.2 0.0 - 0.5 K/uL   Basophils Relative 1 %   Basophils Absolute  0.1 0.0 - 0.1 K/uL   Immature Granulocytes 0 %   Abs Immature Granulocytes 0.02 0.00 - 0.07 K/uL    Comment: Performed at [facility], [address] [place], [place] [address]  Lipase, blood     Status: None   Collection Time: 02/25/24  3:55 PM  Result Value Ref Range   Lipase 27 11 - 51 U/L    Comment: Performed at [facility], [address] [place], [place] [address]  Comprehensive metabolic panel     Status: Abnormal   Collection Time: 02/25/24  3:55 PM  Result Value Ref Range   Sodium 136 135 - 145 mmol/L   Potassium 3.4 (L) 3.5 - 5.1 mmol/L   Chloride 103 98 - 111 mmol/L   CO2 28 22 - 32 mmol/L   Glucose, Bld 85 70 - 99 mg/dL    Comment: Glucose reference range applies only to samples taken after fasting for at least 8 hours.   BUN 6 6 - 20 mg/dL   Creatinine, Ser 5.62 (H) 0.44 - 1.00 mg/dL   Calcium 8.7 (L) 8.9 - 10.3 mg/dL   Total Protein 6.5 6.5 - 8.1 g/dL   Albumin 3.6 3.5 - 5.0 g/dL   AST 17 15 - 41 U/L   ALT 16 0 - 44 U/L   Alkaline Phosphatase 48 38 - 126 U/L   Total Bilirubin 1.1 0.0 - 1.2 mg/dL   GFR, Estimated >13 >08 mL/min    Comment: (NOTE) Calculated using the CKD-EPI Creatinine Equation (2021)    Anion gap 5 5 - 15    Comment: Performed at [facility], [address] [place], [place] [address]  Comprehensive metabolic panel     Status: Abnormal   Collection Time: 02/26/24  3:43 AM   Result Value Ref Range   Sodium 137 135 - 145 mmol/L   Potassium 3.5 3.5 - 5.1 mmol/L   Chloride 105 98 - 111 mmol/L   CO2 25 22 - 32 mmol/L   Glucose, Bld 91 70 - 99 mg/dL    Comment: Glucose reference range applies only to samples taken after fasting for at least 8 hours.   BUN 7 6 - 20 mg/dL   Creatinine, Ser 6.96 0.44 - 1.00 mg/dL   Calcium 8.8 (L) 8.9 - 10.3 mg/dL   Total Protein 6.1 (L) 6.5 - 8.1 g/dL   Albumin 3.3 (L) 3.5 - 5.0 g/dL   AST 15 15 - 41 U/L   ALT 13 0 - 44 U/L   Alkaline Phosphatase 43 38 - 126 U/L   Total Bilirubin 0.8 0.0 - 1.2 mg/dL   GFR, Estimated >29 >52 mL/min    Comment: (NOTE) Calculated using the CKD-EPI Creatinine Equation (2021)    Anion gap 7 5 - 15    Comment: Performed at [facility], [address], [place], [place] [address]  CBC     Status: Abnormal   Collection Time: 02/26/24  3:43 AM  Result Value Ref Range   WBC 9.9 4.0 - 10.5 K/uL   RBC 3.89 3.87 - 5.11 MIL/uL   Hemoglobin 12.0 12.0 - 15.0 g/dL   HCT 44.0 10.2 - 72.5 %   MCV 97.4 80.0 - 100.0 fL   MCH 30.8 26.0 - 34.0 pg   MCHC 31.7 30.0 - 36.0 g/dL   RDW 36.6 44.0 - 34.7 %   Platelets 415 (H) 150 - 400 K/uL   nRBC  0.0 0.0 - 0.2 %    Comment: Performed at [facility], [address] [place], [place] [address]  Glucose, capillary     Status: None   Collection Time: 02/26/24  7:38 AM  Result Value Ref Range   Glucose-Capillary 78 70 - 99 mg/dL    Comment: Glucose reference range applies only to samples taken after fasting for at least 8 hours.    Imaging / Studies: US ABDOMEN LIMITED RUQ (LIVER/GB) Result Date: 02/25/2024 CLINICAL DATA:  Right upper quadrant pain.  Nausea and vomiting. EXAM: ULTRASOUND ABDOMEN LIMITED RIGHT UPPER QUADRANT COMPARISON:  CT earlier today FINDINGS: Gallbladder: Moderately distended. Multiple gallstones with edematous gallbladder wall thickening measuring up to 6 mm. Nodular wall thickening versus adherent  stones. Small pericholecystic fluid. Positive sonographic Murphy sign noted by sonographer. Common bile duct: Diameter: 3-4 mm, normal. Liver: No focal lesion identified. Diffusely increased in parenchymal echogenicity. Portal vein is patent on color Doppler imaging with normal direction of blood flow towards the liver. Other: None. IMPRESSION: 1. Sonographic findings consistent with acute cholecystitis in the appropriate clinical setting. 2. No biliary dilatation. 3. Hepatic steatosis. Electronically Signed   By: Narda Rutherford M.D.   On: 02/25/2024 14:07   CT ABDOMEN PELVIS W CONTRAST Result Date: 02/25/2024 CLINICAL DATA:  Right upper quadrant pain for 3 hours.  Vomiting x2. EXAM: CT ABDOMEN AND PELVIS WITH CONTRAST TECHNIQUE: Multidetector CT imaging of the abdomen and pelvis was performed using the standard protocol following bolus administration of intravenous contrast. RADIATION DOSE REDUCTION: This exam was performed according to the departmental dose-optimization program which includes automated exposure control, adjustment of the mA and/or kV according to patient size and/or use of iterative reconstruction technique. CONTRAST:  OMNIPAQUE IOHEXOL 300 MG/ML  SOLN COMPARISON:  CT abdomen and pelvis 02/07/2018 FINDINGS: Lower chest: No acute abnormality. Hepatobiliary: Unremarkable liver. Normal gallbladder. No biliary dilation. Pancreas: Unremarkable. Spleen: Unremarkable. Adrenals/Urinary Tract: Normal adrenal glands. No urinary calculi or hydronephrosis. Bladder is unremarkable. Stomach/Bowel: Normal caliber large and small bowel. No bowel wall thickening. The appendix is normal.Stomach is within normal limits. Vascular/Lymphatic: No significant vascular findings are present. No enlarged abdominal or pelvic lymph nodes. Reproductive: Hysterectomy. The right ovary is not visualized. 5.5 cm left adnexal cyst. Other: No free intraperitoneal fluid or air. Musculoskeletal: No acute fracture.  Bilateral  THAs. IMPRESSION: 1. No acute abnormality in the abdomen or pelvis. 2. 5.5 cm left adnexal cyst. Follow-up pelvic ultrasound in 6-12 months is recommended. Reference: JACR 2020 Feb;17(2):248-254 Electronically Signed   By: Minerva Fester M.D.   On: 02/25/2024 02:45     .Ardeth Sportsman, M.D., F.A.C.S. Gastrointestinal and Minimally Invasive Surgery [facility] [address] [place], [place] [address] [phone] Main / Paging  02/26/2024 9:03 AM    Ardeth Sportsman

## 2024-02-26 NOTE — Plan of Care (Signed)

## 2024-02-26 NOTE — Transfer of Care (Signed)
 Immediate Anesthesia Transfer of Care Note  Patient: Ann Frederick  Procedure(s) Performed: LAPAROSCOPIC CHOLECYSTECTOMY  INTRAOPERATIVE CHOLANGIOGRAM (Abdomen)  Patient Location: PACU  Anesthesia Type:General  Level of Consciousness: awake and alert   Airway & Oxygen Therapy: Patient Spontanous Breathing and Patient connected to face mask oxygen  Post-op Assessment: Report given to RN and Post -op Vital signs reviewed and stable  Post vital signs: Reviewed and stable  Last Vitals:  Vitals Value Taken Time  BP 152/89 02/26/24 1137  Temp    Pulse 87 02/26/24 1138  Resp 18 02/26/24 1138  SpO2 100 % 02/26/24 1138  Vitals shown include unfiled device data.  Last Pain:  Vitals:   02/26/24 0830  TempSrc:   PainSc: 0-No pain         Complications: No notable events documented.

## 2024-02-26 NOTE — Anesthesia Procedure Notes (Signed)
 Procedure Name: Intubation Date/Time: 02/26/2024 10:25 AM  Performed by: Deri Fuelling, CRNAPre-anesthesia Checklist: Patient identified, Emergency Drugs available, Suction available and Patient being monitored Patient Re-evaluated:Patient Re-evaluated prior to induction Oxygen Delivery Method: Circle system utilized Preoxygenation: Pre-oxygenation with 100% oxygen Induction Type: IV induction Ventilation: Mask ventilation without difficulty Laryngoscope Size: Mac and 3 Grade View: Grade II Tube type: Oral Tube size: 7.0 mm Number of attempts: 1 Airway Equipment and Method: Stylet and Oral airway Placement Confirmation: ETT inserted through vocal cords under direct vision, positive ETCO2 and breath sounds checked- equal and bilateral Secured at: 22 cm Tube secured with: Tape Dental Injury: Teeth and Oropharynx as per pre-operative assessment

## 2024-02-26 NOTE — Progress Notes (Signed)
   02/26/24 1336  TOC Brief Assessment  Insurance and Status Reviewed  Patient has primary care physician Yes  Home environment has been reviewed resides in private residence with spouse  Prior level of function: Independent  Prior/Current Home Services No current home services  Social Drivers of Health Review SDOH reviewed no interventions necessary  Readmission risk has been reviewed Yes  Transition of care needs no transition of care needs at this time

## 2024-02-26 NOTE — Discharge Summary (Signed)
 Physician Discharge Summary    Ann Frederick MRN: [id number] DOB/AGE: Jul 24, 1971 = 53 y.o.  Patient Care Team: Saguier, Kateri Mc as PCP - General (Internal Medicine) Jodelle Red, MD as PCP - Cardiology (Cardiology) Karie Soda, MD as Consulting Physician (General Surgery)  Admit date: 02/25/2024  Discharge date: 02/26/2024  Hospital Stay = 0 days    Discharge Diagnoses:  Principal Problem:   Acute on chronic cholecystitis Active Problems:   OSA (obstructive sleep apnea)   Cholecystitis   * Day of Surgery *  02/26/2024  POST-OPERATIVE DIAGNOSIS:   Acute Cholecystitis  SURGERY:  02/26/2024  Procedure(s): LAPAROSCOPIC CHOLECYSTECTOMY  INTRAOPERATIVE CHOLANGIOGRAM  SURGEON:    Surgeon(s): Karie Soda, MD  Consults: Anesthesia  Hospital Course:   The patient underwent the surgery above.  Postoperatively, the patient gradually mobilized and advanced to a solid diet.  Pain and other symptoms were treated aggressively.    By the time of discharge, the patient was walking well the hallways, eating food, having flatus.  Pain was well-controlled on an oral medications.  Based on meeting discharge criteria and continuing to recover, I felt it was safe for the patient to be discharged from the hospital to further recover with close followup. Postoperative recommendations were discussed in detail.  They are written as well.  Discharged Condition: good  Discharge Exam: Blood pressure (!) 144/84, pulse 84, temperature 98 F (36.7 C), resp. rate 17, height 5\' 2"  (1.575 m), weight 74.4 kg, last menstrual period 02/11/2016, SpO2 100%.  General: Pt awake/alert/oriented x4 in No acute distress Eyes: PERRL, normal EOM.  Sclera clear.  No icterus Neuro: CN II-XII intact w/o focal sensory/motor deficits. Lymph: No head/neck/groin lymphadenopathy Psych:  No delerium/psychosis/paranoia HENT: Normocephalic, Mucus membranes moist.  No thrush Neck: Supple, No tracheal  deviation Chest:  No chest wall pain w good excursion CV:  Pulses intact.  Regular rhythm MS: Normal AROM mjr joints.  No obvious deformity Abdomen: Soft.  Nondistended.  Mildly tender at incisions only.  No evidence of peritonitis.  No incarcerated hernias. Ext:  SCDs BLE.  No mjr edema.  No cyanosis Skin: No petechiae / purpura   Disposition:    Follow-up Information     [facility] Follow up in 3 week(s).   Specialty: General Surgery Why: To follow up after your operation Contact information: [address] [place] [address] [phone]                Discharge disposition: 01-Home or Self Care       Discharge Instructions     Call MD for:   Complete by: As directed    FEVER > 101.5 F  (temperatures < 101.5 F are not significant)   Call MD for:  extreme fatigue   Complete by: As directed    Call MD for:  persistant dizziness or light-headedness   Complete by: As directed    Call MD for:  persistant nausea and vomiting   Complete by: As directed    Call MD for:  redness, tenderness, or signs of infection (pain, swelling, redness, odor or green/yellow discharge around incision site)   Complete by: As directed    Call MD for:  severe uncontrolled pain   Complete by: As directed    Diet - low sodium heart healthy   Complete by: As directed    Start with a bland diet such as soups, liquids, starchy foods, low fat foods, etc. the first few days  at home. Gradually advance to a solid, low-fat, high fiber diet by the end of the first week at home.   Add a fiber supplement to your diet (Metamucil, etc) If you feel full, bloated, or constipated, stay on a full liquid or pureed/blenderized diet for a few days until you feel better and are no longer constipated.   Discharge instructions   Complete by: As directed    See Discharge Instructions If you are not getting better after two weeks or are noticing you are  getting worse, contact our office [phone] for further advice.  We may need to adjust your medications, re-evaluate you in the office, send you to the emergency room, or see what other things we can do to help. The clinic staff is available to answer your questions during regular business hours (8:30am-5pm).  Please don't hesitate to call and ask to speak to one of our nurses for clinical concerns.    A surgeon from [facility] is always on call at the hospitals 24 hours/day If you have a medical emergency, go to the nearest emergency room or call 911.   Discharge wound care:   Complete by: As directed    It is good for closed incisions and even open wounds to be washed every day.  Shower every day.  Short baths are fine.  Wash the incisions and wounds clean with soap & water.    You may leave closed incisions open to air if it is dry.   You may cover the incision with clean gauze & replace it after your daily shower for comfort.  TEGADERM:  You have clear gauze band-aid dressings over your closed incision(s).  Remove the dressings 2 days after surgery = 3/4 Tuesday   Driving Restrictions   Complete by: As directed    You may drive when: - you are no longer taking narcotic prescription pain medication - you can comfortably wear a seatbelt - you can safely make sudden turns/stops without pain.   Increase activity slowly   Complete by: As directed    Start light daily activities --- self-care, walking, climbing stairs- beginning the day after surgery.  Gradually increase activities as tolerated.  Control your pain to be active.  Stop when you are tired.  Ideally, walk several times a day, eventually an hour a day.   Most people are back to most day-to-day activities in a few weeks.  It takes 4-6 weeks to get back to unrestricted, intense activity. If you can walk 30 minutes without difficulty, it is safe to try more intense activity such as jogging, treadmill, bicycling,  low-impact aerobics, swimming, etc. Save the most intensive and strenuous activity for last (Usually 4-8 weeks after surgery) such as sit-ups, heavy lifting, contact sports, etc.  Refrain from any intense heavy lifting or straining until you are off narcotics for pain control.  You will have off days, but things should improve week-by-week. DO NOT PUSH THROUGH PAIN.  Let pain be your guide: If it hurts to do something, don't do it.   Lifting restrictions   Complete by: As directed    If you can walk 30 minutes without difficulty, it is safe to try more intense activity such as jogging, treadmill, bicycling, low-impact aerobics, swimming, etc. Save the most intensive and strenuous activity for last (Usually 4-8 weeks after surgery) such as sit-ups, heavy lifting, contact sports, etc.   Refrain from any intense heavy lifting or straining until you are off narcotics  for pain control.  You will have off days, but things should improve week-by-week. DO NOT PUSH THROUGH PAIN.  Let pain be your guide: If it hurts to do something, don't do it.  Pain is your body warning you to avoid that activity for another week until the pain goes down.   May shower / Bathe   Complete by: As directed    May walk up steps   Complete by: As directed    Remove dressing in 48 hours   Complete by: As directed    Make sure all dressings have been removed on the second day after surgery = 3/4 Tuesday Leave incisions open to air.  OK to cover incisions with gauze or bandages as desired   Sexual Activity Restrictions   Complete by: As directed    You may have sexual intercourse when it is comfortable. If it hurts to do something, stop.       Allergies as of 02/26/2024       Reactions   Azithromycin Diarrhea, Nausea And Vomiting   Dilaudid [hydromorphone] Nausea Only        Medication List     TAKE these medications    ADVANCED COLLAGEN PO Take 1 tablet by mouth daily.   ALPRAZolam 0.5 MG tablet Commonly  known as: XANAX Take 0.5 mg by mouth 3 (three) times daily as needed for anxiety.   aspirin EC 81 MG tablet Take 81 mg by mouth in the morning. Swallow whole.   B-D ULTRAFINE III SHORT PEN 31G X 8 MM Misc Generic drug: Insulin Pen Needle USE AS DIRECTED TO INJECT SAXENDA   buPROPion 300 MG 24 hr tablet Commonly known as: WELLBUTRIN XL Take 300 mg by mouth every morning.   busPIRone 15 MG tablet Commonly known as: BUSPAR TAKE 1 TABLET BY MOUTH TWICE A DAY What changed: when to take this   ezetimibe 10 MG tablet Commonly known as: ZETIA Take 1 tablet (10 mg total) by mouth daily.   HYDROcodone-acetaminophen 5-325 MG tablet Commonly known as: NORCO/VICODIN Take 1 tablet by mouth every 6 (six) hours as needed for severe pain (pain score 7-10) or moderate pain (pain score 4-6). What changed: reasons to take this   LYSINE PO Take 1 capsule by mouth daily.   metFORMIN 500 MG tablet Commonly known as: GLUCOPHAGE TAKE 1 TABLET BY MOUTH TWICE A DAY WITH FOOD What changed: when to take this   omeprazole 20 MG capsule Commonly known as: PRILOSEC Take 1 capsule (20 mg total) by mouth daily. What changed:  when to take this reasons to take this   ondansetron 4 MG disintegrating tablet Commonly known as: ZOFRAN-ODT Take 1 tablet (4 mg total) by mouth every 8 (eight) hours as needed. What changed: reasons to take this   prenatal multivitamin Tabs tablet Take 1 tablet by mouth daily at 12 noon.   prochlorperazine 5 MG tablet Commonly known as: COMPAZINE Take 1-2 tablets (5-10 mg total) by mouth every 6 (six) hours as needed for nausea, vomiting or refractory nausea / vomiting.   SLOW FE PO Take 1 tablet by mouth daily with breakfast.   Vitamin D3 1000 units Caps Take 1,000 Units by mouth daily.   Wegovy 0.25 MG/0.5ML Soaj Generic drug: Semaglutide-Weight Management INJECT 0.25 MG SUBCUTANEOUSLY ONE TIME PER WEEK What changed: See the new instructions.   Zinc 50 MG  Tabs Take 50 mg by mouth daily.   zolpidem 10 MG tablet Commonly known as: AMBIEN Take 5-10  mg by mouth at bedtime as needed for sleep.               Discharge Care Instructions  (From admission, onward)           Start     Ordered   02/26/24 0000  Discharge wound care:       Comments: It is good for closed incisions and even open wounds to be washed every day.  Shower every day.  Short baths are fine.  Wash the incisions and wounds clean with soap & water.    You may leave closed incisions open to air if it is dry.   You may cover the incision with clean gauze & replace it after your daily shower for comfort.  TEGADERM:  You have clear gauze band-aid dressings over your closed incision(s).  Remove the dressings 2 days after surgery = 3/4 Tuesday   02/26/24 1133            Significant Diagnostic Studies:  Results for orders placed or performed during the hospital encounter of 02/25/24 (from the past 72 hours)  CBC with Differential     Status: Abnormal   Collection Time: 02/25/24  3:55 PM  Result Value Ref Range   WBC 12.0 (H) 4.0 - 10.5 K/uL   RBC 4.15 3.87 - 5.11 MIL/uL   Hemoglobin 12.9 12.0 - 15.0 g/dL   HCT 16.1 09.6 - 04.5 %   MCV 93.0 80.0 - 100.0 fL   MCH 31.1 26.0 - 34.0 pg   MCHC 33.4 30.0 - 36.0 g/dL   RDW 40.9 81.1 - 91.4 %   Platelets 458 (H) 150 - 400 K/uL   nRBC 0.0 0.0 - 0.2 %   Neutrophils Relative % 66 %   Neutro Abs 8.1 (H) 1.7 - 7.7 K/uL   Lymphocytes Relative 24 %   Lymphs Abs 2.9 0.7 - 4.0 K/uL   Monocytes Relative 7 %   Monocytes Absolute 0.8 0.1 - 1.0 K/uL   Eosinophils Relative 2 %   Eosinophils Absolute 0.2 0.0 - 0.5 K/uL   Basophils Relative 1 %   Basophils Absolute 0.1 0.0 - 0.1 K/uL   Immature Granulocytes 0 %   Abs Immature Granulocytes 0.02 0.00 - 0.07 K/uL    Comment: Performed at [facility], [address] [place], [place] [address]  Lipase, blood     Status: None   Collection Time: 02/25/24  3:55 PM   Result Value Ref Range   Lipase 27 11 - 51 U/L    Comment: Performed at [facility], [address] [place], [place] [address]  Comprehensive metabolic panel     Status: Abnormal   Collection Time: 02/25/24  3:55 PM  Result Value Ref Range   Sodium 136 135 - 145 mmol/L   Potassium 3.4 (L) 3.5 - 5.1 mmol/L   Chloride 103 98 - 111 mmol/L   CO2 28 22 - 32 mmol/L   Glucose, Bld 85 70 - 99 mg/dL    Comment: Glucose reference range applies only to samples taken after fasting for at least 8 hours.   BUN 6 6 - 20 mg/dL   Creatinine, Ser 8.65 (H) 0.44 - 1.00 mg/dL   Calcium 8.7 (L) 8.9 - 10.3 mg/dL   Total Protein 6.5 6.5 - 8.1 g/dL   Albumin 3.6 3.5 - 5.0 g/dL   AST 17 15 - 41 U/L   ALT 16 0 - 44 U/L  Alkaline Phosphatase 48 38 - 126 U/L   Total Bilirubin 1.1 0.0 - 1.2 mg/dL   GFR, Estimated >16 >10 mL/min    Comment: (NOTE) Calculated using the CKD-EPI Creatinine Equation (2021)    Anion gap 5 5 - 15    Comment: Performed at [facility], [address] [place], [place] [address]  Comprehensive metabolic panel     Status: Abnormal   Collection Time: 02/26/24  3:43 AM  Result Value Ref Range   Sodium 137 135 - 145 mmol/L   Potassium 3.5 3.5 - 5.1 mmol/L   Chloride 105 98 - 111 mmol/L   CO2 25 22 - 32 mmol/L   Glucose, Bld 91 70 - 99 mg/dL    Comment: Glucose reference range applies only to samples taken after fasting for at least 8 hours.   BUN 7 6 - 20 mg/dL   Creatinine, Ser 4.09 0.44 - 1.00 mg/dL   Calcium 8.8 (L) 8.9 - 10.3 mg/dL   Total Protein 6.1 (L) 6.5 - 8.1 g/dL   Albumin 3.3 (L) 3.5 - 5.0 g/dL   AST 15 15 - 41 U/L   ALT 13 0 - 44 U/L   Alkaline Phosphatase 43 38 - 126 U/L   Total Bilirubin 0.8 0.0 - 1.2 mg/dL   GFR, Estimated >81 >19 mL/min    Comment: (NOTE) Calculated using the CKD-EPI Creatinine Equation (2021)    Anion gap 7 5 - 15    Comment: Performed at [facility], [address] [place], [place] [address]   CBC     Status: Abnormal   Collection Time: 02/26/24  3:43 AM  Result Value Ref Range   WBC 9.9 4.0 - 10.5 K/uL   RBC 3.89 3.87 - 5.11 MIL/uL   Hemoglobin 12.0 12.0 - 15.0 g/dL   HCT 95.6 21.3 - 08.6 %   MCV 97.4 80.0 - 100.0 fL   MCH 30.8 26.0 - 34.0 pg   MCHC 31.7 30.0 - 36.0 g/dL   RDW 57.8 46.9 - 62.9 %   Platelets 415 (H) 150 - 400 K/uL   nRBC 0.0 0.0 - 0.2 %    Comment: Performed at [facility], [address], [place], [place] [address]  HIV Antibody (routine testing w rflx)     Status: None   Collection Time: 02/26/24  3:43 AM  Result Value Ref Range   HIV Screen 4th Generation wRfx Non Reactive Non Reactive    Comment: Performed at [facility], [address] [place], [place] [address]  Glucose, capillary     Status: None   Collection Time: 02/26/24  7:38 AM  Result Value Ref Range   Glucose-Capillary 78 70 - 99 mg/dL    Comment: Glucose reference range applies only to samples taken after fasting for at least 8 hours.  Glucose, capillary     Status: Abnormal   Collection Time: 02/26/24  4:17 PM  Result Value Ref Range   Glucose-Capillary 151 (H) 70 - 99 mg/dL    Comment: Glucose reference range applies only to samples taken after fasting for at least 8 hours.    DG Cholangiogram Operative Result Date: 02/26/2024 CLINICAL DATA:  [id number] Surgery, elective [id number] EXAM: INTRAOPERATIVE CHOLANGIOGRAM TECHNIQUE: Cholangiographic images from the C-arm fluoroscopic device were submitted for interpretation post-operatively. Please see the procedural report for the amount of contrast and the fluoroscopy time utilized. FLUOROSCOPY: Radiation Exposure Index (as provided by the fluoroscopic device): 2.6  mGy Kerma COMPARISON:  CT AP and ultrasound, 02/25/2024. FINDINGS: Limited oblique planar images of the RIGHT upper quadrant obtained C-arm. Images demonstrating laparoscopic instrumentation, cystic duct cannulation and antegrade cholangiogram. Free spillage of  contrast into the duodenum. No biliary ductal dilation. No evidence of biliary filling defect is demonstrated. IMPRESSION: Fluoroscopic imaging for intraoperative cholangiogram For complete description of intra procedural findings, please see performing service dictation. Electronically Signed   By: Roanna Banning M.D.   On: 02/26/2024 11:47   US ABDOMEN LIMITED RUQ (LIVER/GB) Result Date: 02/25/2024 CLINICAL DATA:  Right upper quadrant pain.  Nausea and vomiting. EXAM: ULTRASOUND ABDOMEN LIMITED RIGHT UPPER QUADRANT COMPARISON:  CT earlier today FINDINGS: Gallbladder: Moderately distended. Multiple gallstones with edematous gallbladder wall thickening measuring up to 6 mm. Nodular wall thickening versus adherent stones. Small pericholecystic fluid. Positive sonographic Murphy sign noted by sonographer. Common bile duct: Diameter: 3-4 mm, normal. Liver: No focal lesion identified. Diffusely increased in parenchymal echogenicity. Portal vein is patent on color Doppler imaging with normal direction of blood flow towards the liver. Other: None. IMPRESSION: 1. Sonographic findings consistent with acute cholecystitis in the appropriate clinical setting. 2. No biliary dilatation. 3. Hepatic steatosis. Electronically Signed   By: Narda Rutherford M.D.   On: 02/25/2024 14:07   CT ABDOMEN PELVIS W CONTRAST Result Date: 02/25/2024 CLINICAL DATA:  Right upper quadrant pain for 3 hours.  Vomiting x2. EXAM: CT ABDOMEN AND PELVIS WITH CONTRAST TECHNIQUE: Multidetector CT imaging of the abdomen and pelvis was performed using the standard protocol following bolus administration of intravenous contrast. RADIATION DOSE REDUCTION: This exam was performed according to the departmental dose-optimization program which includes automated exposure control, adjustment of the mA and/or kV according to patient size and/or use of iterative reconstruction technique. CONTRAST:  OMNIPAQUE IOHEXOL 300 MG/ML  SOLN COMPARISON:  CT abdomen  and pelvis 02/07/2018 FINDINGS: Lower chest: No acute abnormality. Hepatobiliary: Unremarkable liver. Normal gallbladder. No biliary dilation. Pancreas: Unremarkable. Spleen: Unremarkable. Adrenals/Urinary Tract: Normal adrenal glands. No urinary calculi or hydronephrosis. Bladder is unremarkable. Stomach/Bowel: Normal caliber large and small bowel. No bowel wall thickening. The appendix is normal.Stomach is within normal limits. Vascular/Lymphatic: No significant vascular findings are present. No enlarged abdominal or pelvic lymph nodes. Reproductive: Hysterectomy. The right ovary is not visualized. 5.5 cm left adnexal cyst. Other: No free intraperitoneal fluid or air. Musculoskeletal: No acute fracture.  Bilateral THAs. IMPRESSION: 1. No acute abnormality in the abdomen or pelvis. 2. 5.5 cm left adnexal cyst. Follow-up pelvic ultrasound in 6-12 months is recommended. Reference: JACR 2020 Feb;17(2):248-254 Electronically Signed   By: Minerva Fester M.D.   On: 02/25/2024 02:45    Past Medical History:  Diagnosis Date   Acne    Anemia    history of   Anxiety    Arthritis    Depression    GERD (gastroesophageal reflux disease)    worse while pregnant   Headache    Migraines   Heart murmur    ? heart murmur in past   History of blood transfusion 2013   Hypertension    Metabolic syndrome     Past Surgical History:  Procedure Laterality Date   ANTERIOR FUSION CERVICAL SPINE  2018   CESAREAN SECTION     x 2   CESAREAN SECTION  09/29/2012   Procedure: CESAREAN SECTION;  Surgeon: Turner Daniels, MD;  Location: [facility];  Service: Obstetrics;  Laterality: N/A;  Repeat edc 10/12/12/REQUEST;Chassity,Dee,Colleen   COLONOSCOPY     ESOPHAGOGASTRODUODENOSCOPY  normal    7/12   JOINT REPLACEMENT Bilateral    LAPAROSCOPIC VAGINAL HYSTERECTOMY WITH SALPINGECTOMY Bilateral 02/23/2016   Procedure: LAPAROSCOPIC ASSISTED VAGINAL HYSTERECTOMY WITH bilateral SALPINGECTOMY, right oophorectomy. laporotic  repair of incidental cystotomy.;  Surgeon: Candice Camp, MD;  Location: [facility];  Service: Gynecology;  Laterality: Bilateral;   MOUTH SURGERY      Social History   Socioeconomic History   Marital status: Married    Spouse name: Not on file   Number of children: 2   Years of education: Not on file   Highest education level: Master's degree (e.g., MA, MS, MEng, MEd, MSW, MBA)  Occupational History   Not on file  Tobacco Use   Smoking status: Never   Smokeless tobacco: Never  Vaping Use   Vaping status: Never Used  Substance and Sexual Activity   Alcohol use: Yes    Comment: 1 glass of wine per week prior to preg   Drug use: No   Sexual activity: Yes  Other Topics Concern   Not on file  Social History Narrative   Not on file   Social Drivers of Health   Financial Resource Strain: Low Risk  (01/06/2024)   Overall Financial Resource Strain (CARDIA)    Difficulty of Paying Living Expenses: Not hard at all  Food Insecurity: Patient Declined (02/25/2024)   Hunger Vital Sign    Worried About Running Out of Food in the Last Year: Patient declined    Ran Out of Food in the Last Year: Patient declined  Transportation Needs: Patient Declined (02/25/2024)   PRAPARE - Administrator, Civil Service (Medical): Patient declined    Lack of Transportation (Non-Medical): Patient declined  Physical Activity: Insufficiently Active (01/06/2024)   Exercise Vital Sign    Days of Exercise per Week: 2 days    Minutes of Exercise per Session: 30 min  Stress: Stress Concern Present (01/06/2024)   [employer] of Occupational Health - Occupational Stress Questionnaire    Feeling of Stress : To some extent  Social Connections: Patient Declined (02/25/2024)   Social Connection and Isolation Panel [NHANES]    Frequency of Communication with Friends and Family: Patient declined    Frequency of Social Gatherings with Friends and Family: Patient declined    Attends Religious Services: Patient  declined    Database administrator or Organizations: Patient declined    Attends Banker Meetings: Patient declined    Marital Status: Patient declined  Intimate Partner Violence: Patient Declined (02/25/2024)   Humiliation, Afraid, Rape, and Kick questionnaire    Fear of Current or Ex-Partner: Patient declined    Emotionally Abused: Patient declined    Physically Abused: Patient declined    Sexually Abused: Patient declined    Family History  Problem Relation Age of Onset   Depression Mother    Hypertension Mother    Diabetes Mother    Obesity Mother    Thyroid disease Mother    Cancer Father        ? lung CA   Kidney disease Father    Hydrocephalus Sister    Colon cancer Neg Hx    Esophageal cancer Neg Hx    Liver cancer Neg Hx    Pancreatic cancer Neg Hx    Rectal cancer Neg Hx    Stomach cancer Neg Hx     Current Facility-Administered Medications  Medication Dose Route Frequency Provider Last Rate Last Admin   0.9 %  sodium chloride infusion  250 mL Intravenous PRN Karie Soda, MD       acetaminophen (TYLENOL) tablet 500 mg  500 mg Oral TID WC & HS Karie Soda, MD       ALPRAZolam Prudy Feeler) tablet 0.5 mg  0.5 mg Oral TID PRN Karie Soda, MD       alum & mag hydroxide-simeth (MAALOX/MYLANTA) 200-200-20 MG/5ML suspension 30 mL  30 mL Oral Q6H PRN Karie Soda, MD       Melene Muller ON 02/27/2024] aspirin EC tablet 81 mg  81 mg Oral Daily Karie Soda, MD       buPROPion (WELLBUTRIN XL) 24 hr tablet 300 mg  300 mg Oral q morning Karie Soda, MD       busPIRone (BUSPAR) tablet 15 mg  15 mg Oral BID Karie Soda, MD   15 mg at 02/25/24 2107   cefTRIAXone (ROCEPHIN) 2 g in sodium chloride 0.9 % 100 mL IVPB  2 g Intravenous Q24H Karie Soda, MD   Stopped at 02/26/24 1102   [START ON 02/27/2024] cholecalciferol (VITAMIN D3) 25 MCG (1000 UNIT) tablet 1,000 Units  1,000 Units Oral Daily Karie Soda, MD       diphenhydrAMINE (BENADRYL) 12.5 MG/5ML elixir 12.5 mg  12.5  mg Oral Q6H PRN Karie Soda, MD       Or   diphenhydrAMINE (BENADRYL) injection 12.5 mg  12.5 mg Intravenous Q6H PRN Karie Soda, MD       enoxaparin (LOVENOX) injection 40 mg  40 mg Subcutaneous Q24H Karie Soda, MD       ezetimibe (ZETIA) tablet 10 mg  10 mg Oral Daily Karie Soda, MD   10 mg at 02/25/24 1950   HYDROcodone-acetaminophen (NORCO/VICODIN) 5-325 MG per tablet 1-2 tablet  1-2 tablet Oral Q4H PRN Karie Soda, MD   1 tablet at 02/26/24 1436   HYDROmorphone (DILAUDID) injection 0.5-2 mg  0.5-2 mg Intravenous Q2H PRN Karie Soda, MD       insulin aspart (novoLOG) injection 0-15 Units  0-15 Units Subcutaneous TID WC Karie Soda, MD       lactated ringers bolus 1,000 mL  1,000 mL Intravenous Q8H PRN Karie Soda, MD       magic mouthwash  15 mL Oral QID PRN Karie Soda, MD       menthol-cetylpyridinium (CEPACOL) lozenge 3 mg  1 lozenge Oral PRN Karie Soda, MD       metFORMIN (GLUCOPHAGE) tablet 500 mg  500 mg Oral BID WC Karie Soda, MD       methocarbamol (ROBAXIN) injection 1,000 mg  1,000 mg Intravenous Q6H PRN Karie Soda, MD       methocarbamol (ROBAXIN) tablet 1,000 mg  1,000 mg Oral Q6H PRN Karie Soda, MD       naphazoline-glycerin (CLEAR EYES REDNESS) ophth solution 1-2 drop  1-2 drop Both Eyes QID PRN Karie Soda, MD       Oral care mouth rinse  15 mL Mouth Rinse PRN Virgie Chery, Viviann Spare, MD       pantoprazole (PROTONIX) EC tablet 40 mg  40 mg Oral Daily Karie Soda, MD   40 mg at 02/25/24 1950   phenol (CHLORASEPTIC) mouth spray 2 spray  2 spray Mouth/Throat PRN Karie Soda, MD       polycarbophil (FIBERCON) tablet 625 mg  625 mg Oral BID Karie Soda, MD       prenatal multivitamin tablet 1 tablet  1 tablet Oral Daily Karie Soda, MD       prochlorperazine (COMPAZINE) injection 10  mg  10 mg Intravenous Q4H PRN Karie Soda, MD   10 mg at 02/25/24 2005   prochlorperazine (COMPAZINE) tablet 5-10 mg  5-10 mg Oral Q6H PRN Karie Soda, MD   5 mg at  02/26/24 1437   senna (SENOKOT) tablet 8.6 mg  1 tablet Oral BID Karie Soda, MD   8.6 mg at 02/26/24 1621   simethicone (MYLICON) chewable tablet 40 mg  40 mg Oral Q6H PRN Karie Soda, MD       sodium chloride (OCEAN) 0.65 % nasal spray 1-2 spray  1-2 spray Each Nare Q6H PRN Karie Soda, MD       sodium chloride flush (NS) 0.9 % injection 3 mL  3 mL Intravenous Catha Gosselin, MD       sodium chloride flush (NS) 0.9 % injection 3 mL  3 mL Intravenous PRN Karie Soda, MD       zinc sulfate (50mg  elemental zinc) capsule 220 mg  220 mg Oral Daily Karie Soda, MD       zolpidem (AMBIEN) tablet 5 mg  5 mg Oral QHS PRN Karie Soda, MD       Current Outpatient Medications  Medication Sig Dispense Refill   ALPRAZolam (XANAX) 0.5 MG tablet Take 0.5 mg by mouth 3 (three) times daily as needed for anxiety.     aspirin EC 81 MG tablet Take 81 mg by mouth in the morning. Swallow whole.     buPROPion (WELLBUTRIN XL) 300 MG 24 hr tablet Take 300 mg by mouth every morning.     busPIRone (BUSPAR) 15 MG tablet TAKE 1 TABLET BY MOUTH TWICE A DAY (Patient taking differently: Take 15 mg by mouth in the morning.) 180 tablet 2   Cholecalciferol (VITAMIN D3) 1000 units CAPS Take 1,000 Units by mouth daily.     ezetimibe (ZETIA) 10 MG tablet Take 1 tablet (10 mg total) by mouth daily. 90 tablet 3   Ferrous Sulfate (SLOW FE PO) Take 1 tablet by mouth daily with breakfast.     LYSINE PO Take 1 capsule by mouth daily.     metFORMIN (GLUCOPHAGE) 500 MG tablet TAKE 1 TABLET BY MOUTH TWICE A DAY WITH FOOD (Patient taking differently: Take 500 mg by mouth daily with breakfast.) 180 tablet 0   omeprazole (PRILOSEC) 20 MG capsule Take 1 capsule (20 mg total) by mouth daily. (Patient taking differently: Take 20 mg by mouth daily as needed (for heartburn).) 30 capsule 0   ondansetron (ZOFRAN-ODT) 4 MG disintegrating tablet Take 1 tablet (4 mg total) by mouth every 8 (eight) hours as needed. (Patient taking  differently: Take 4 mg by mouth every 8 (eight) hours as needed for nausea or vomiting (dissolve orally).) 8 tablet 0   Prenatal Vit-Fe Fumarate-FA (PRENATAL MULTIVITAMIN) TABS tablet Take 1 tablet by mouth daily at 12 noon.     prochlorperazine (COMPAZINE) 5 MG tablet Take 1-2 tablets (5-10 mg total) by mouth every 6 (six) hours as needed for nausea, vomiting or refractory nausea / vomiting. 15 tablet 2   Semaglutide-Weight Management (WEGOVY) 0.25 MG/0.5ML SOAJ INJECT 0.25 MG SUBCUTANEOUSLY ONE TIME PER WEEK (Patient taking differently: Inject 0.25 mg into the skin every Sunday.) 0.5 mL 1   Specialty Vitamins Products (ADVANCED COLLAGEN PO) Take 1 tablet by mouth daily.     Zinc 50 MG TABS Take 50 mg by mouth daily.     zolpidem (AMBIEN) 10 MG tablet Take 5-10 mg by mouth at bedtime as needed for sleep.  HYDROcodone-acetaminophen (NORCO/VICODIN) 5-325 MG tablet Take 1 tablet by mouth every 6 (six) hours as needed for severe pain (pain score 7-10) or moderate pain (pain score 4-6). 20 tablet 0   Insulin Pen Needle (B-D ULTRAFINE III SHORT PEN) 31G X 8 MM MISC USE AS DIRECTED TO INJECT SAXENDA 100 each 3     Allergies  Allergen Reactions   Azithromycin Diarrhea and Nausea And Vomiting   Dilaudid [Hydromorphone] Nausea Only    Signed:   Ardeth Sportsman, MD, FACS, MASCRS Esophageal, Gastrointestinal & Colorectal Surgery Robotic and Minimally Invasive Surgery  [facility] [address] [place], [place] [address] [phone] Fax [phone] Main  CONTACT INFORMATION: Weekday (9AM-5PM): Call [facility] main office at [phone] Weeknight (5PM-9AM) or Weekend/Holiday: Check EPIC "Web Links" tab & use "AMION" (password " [email or web address]") for General Surgery [facility] coverage  Please, DO NOT use SecureChat  (it is not reliable communication to reach operating surgeons & will lead to a delay in care).   Epic staff messaging available for  outptient concerns needing 1-2 business day response.      02/26/2024, 10:21 PM

## 2024-02-27 ENCOUNTER — Encounter (HOSPITAL_COMMUNITY): Payer: Self-pay | Admitting: Surgery

## 2024-02-28 ENCOUNTER — Ambulatory Visit: Admitting: Medical

## 2024-02-28 LAB — SURGICAL PATHOLOGY

## 2024-03-06 ENCOUNTER — Ambulatory Visit (HOSPITAL_BASED_OUTPATIENT_CLINIC_OR_DEPARTMENT_OTHER)
Admission: RE | Admit: 2024-03-06 | Discharge: 2024-03-06 | Disposition: A | Discharge status: Home / Self Care | Source: Ambulatory Visit | Attending: Medical | Admitting: Medical

## 2024-03-06 ENCOUNTER — Encounter: Payer: Self-pay | Admitting: Medical

## 2024-03-06 ENCOUNTER — Ambulatory Visit (INDEPENDENT_AMBULATORY_CARE_PROVIDER_SITE_OTHER): Admitting: Medical

## 2024-03-06 VITALS — BP 118/70 | HR 84 | Temp 98.0°F | Resp 18 | Ht 62.0 in | Wt 160.0 lb

## 2024-03-06 DIAGNOSIS — E6609 Other obesity due to excess calories: Secondary | ICD-10-CM

## 2024-03-06 DIAGNOSIS — Z9049 Acquired absence of other specified parts of digestive tract: Secondary | ICD-10-CM | POA: Diagnosis not present

## 2024-03-06 DIAGNOSIS — N83202 Unspecified ovarian cyst, left side: Secondary | ICD-10-CM | POA: Diagnosis not present

## 2024-03-06 DIAGNOSIS — Z6834 Body mass index (BMI) 34.0-34.9, adult: Secondary | ICD-10-CM

## 2024-03-06 DIAGNOSIS — K219 Gastro-esophageal reflux disease without esophagitis: Secondary | ICD-10-CM | POA: Diagnosis not present

## 2024-03-06 DIAGNOSIS — I10 Essential (primary) hypertension: Secondary | ICD-10-CM

## 2024-03-06 DIAGNOSIS — R059 Cough, unspecified: Secondary | ICD-10-CM | POA: Insufficient documentation

## 2024-03-06 DIAGNOSIS — E66811 Obesity, class 1: Secondary | ICD-10-CM

## 2024-03-06 DIAGNOSIS — E785 Hyperlipidemia, unspecified: Secondary | ICD-10-CM

## 2024-03-06 NOTE — Patient Instructions (Addendum)
 Post-cholecystectomy recovery with cough. Recovering from laparoscopic cholecystectomy. Concern for possible post-operative pneumonia due cough. - Order chest X-ray if symptoms persist.  Ovarian cyst 5.5 cm ovarian cyst with associated lower right groin pain. Discussed rupture and torsion risks. - Refer to Dr. Rana Snare for further evaluation. - Consider NSAIDs for pain management, caution with blood pressure. - Use Tylenol and low-dose ibuprofen for pain if needed.  Hypertension Blood pressure well-controlled with chlorthalidone. Considering losartan due to dehydration concerns.  - Start losartan with omeprazole.(discussed benefits vs risk). You did not appear to have angioedema reaction with losartan. Reported signs/symptom of throat discomfort resolved with ppi. You mention as you start losartan will do when husband home and you have epipen.   - Monitor blood pressure and report via MyChart after 7-10 days. - Discontinue chlorthalidone if losartan is tolerated.  Weight management with [facility] post-surgery. Significant weight loss noted. Cautious restart due to pancreatitis risk. - Restart Wegovy in 2 weeks at low dose. - Monitor for side effects and report immediately. - Check lipase levels prior to restarting.  General Health Maintenance Interested in lipid monitoring and overall health maintenance. - Order lipid panel as future lab.   Follow up date to be determined after labs done and bp update

## 2024-03-06 NOTE — Progress Notes (Signed)
 Subjective:    Patient ID: Ann Frederick, female    DOB: 1971-11-30, 53 y.o.   MRN: [id number]  HPI  Pt recently hospitalized for the below.   Discharge Diagnoses:  Principal Problem:   Acute on chronic cholecystitis Active Problems:   OSA (obstructive sleep apnea)   Cholecystitis  Acute Cholecystitis   SURGERY:  02/26/2024   Procedure(s): LAPAROSCOPIC CHOLECYSTECTOMY  INTRAOPERATIVE CHOLANGIOGRAM   Discussed the use of AI scribe software for clinical note transcription with the patient, who gave verbal consent to proceed.  History of Present Illness   The patient presents for follow-up after recent gallbladder surgery and concerns about an ovarian cyst.  She experienced severe upper right quadrant pain, described as 'twenty out of ten,' which led her to the emergency room last Friday. The pain was persistent and unmanageable for about ninety minutes before she received treatment. An ultrasound the following day revealed acute cholecystitis with cholelithiasis, leading to laparoscopic gallbladder removal on Sunday. She is currently recovering but feels tired and notes that recovery is slower compared to previous planned surgeries. No fever, no chills or sweats. No nausea or vomiting.  During the ct study, a 5.5 cm ovarian cyst was noted, which has grown since it was first identified in 2017 or 2019. She i is concerned about the cyst's size and potential complications. She recalls previous imaging from 2019 showing the cyst at 3.3 cm, indicating growth over the years.  She has a history of hypertension and has been taking chlorthalidone sporadically, as she was advised to hold off on losartan. Her blood pressure readings have been variable, with systolic readings generally within normal range but diastolic readings occasionally elevated.    She mentions a past episode of throat discomfort, which she attributes to a combination of factors including medication and dietary changes,  but denies any swelling or wheezing. See last note throat discomfort around time took losartan but then throat discomfort went away with omeprazole leading her to feel like had brief reflux flare. She wants to retry losartan in future.  She has been using [place] for weight management and reports significant weight loss, noting her clothes are now loose. She has paused the medication following her recent surgery.         Review of Systems  Constitutional:  Negative for chills, fatigue and fever.  HENT:  Negative for dental problem.   Respiratory:  Negative for cough, chest tightness and wheezing.   Cardiovascular:  Negative for chest pain and palpitations.  Gastrointestinal:  Negative for abdominal pain, blood in stool, constipation and diarrhea.  Musculoskeletal:  Negative for back pain, joint swelling and neck stiffness.  Skin:  Negative for rash.   Past Medical History:  Diagnosis Date   Acne    Anemia    history of   Anxiety    Arthritis    Depression    GERD (gastroesophageal reflux disease)    worse while pregnant   Headache    Migraines   Heart murmur    ? heart murmur in past   History of blood transfusion 2013   Hypertension    Metabolic syndrome      Social History   Socioeconomic History   Marital status: Married    Spouse name: Not on file   Number of children: 2   Years of education: Not on file   Highest education level: Master's degree (e.g., MA, MS, MEng, MEd, MSW, MBA)  Occupational History   Not on file  Tobacco Use   Smoking status: Never   Smokeless tobacco: Never  Vaping Use   Vaping status: Never Used  Substance and Sexual Activity   Alcohol use: Yes    Comment: 1 glass of wine per week prior to preg   Drug use: No   Sexual activity: Yes  Other Topics Concern   Not on file  Social History Narrative   Not on file   Social Drivers of Health   Financial Resource Strain: Low Risk  (01/06/2024)   Overall Financial Resource Strain (CARDIA)     Difficulty of Paying Living Expenses: Not hard at all  Food Insecurity: Patient Declined (02/25/2024)   Hunger Vital Sign    Worried About Running Out of Food in the Last Year: Patient declined    Ran Out of Food in the Last Year: Patient declined  Transportation Needs: Patient Declined (02/25/2024)   PRAPARE - Administrator, Civil Service (Medical): Patient declined    Lack of Transportation (Non-Medical): Patient declined  Physical Activity: Insufficiently Active (01/06/2024)   Exercise Vital Sign    Days of Exercise per Week: 2 days    Minutes of Exercise per Session: 30 min  Stress: Stress Concern Present (01/06/2024)   [employer] of Occupational Health - Occupational Stress Questionnaire    Feeling of Stress : To some extent  Social Connections: Patient Declined (02/25/2024)   Social Connection and Isolation Panel [NHANES]    Frequency of Communication with Friends and Family: Patient declined    Frequency of Social Gatherings with Friends and Family: Patient declined    Attends Religious Services: Patient declined    Active Member of Clubs or Organizations: Patient declined    Attends Banker Meetings: Patient declined    Marital Status: Patient declined  Intimate Partner Violence: Patient Declined (02/25/2024)   Humiliation, Afraid, Rape, and Kick questionnaire    Fear of Current or Ex-Partner: Patient declined    Emotionally Abused: Patient declined    Physically Abused: Patient declined    Sexually Abused: Patient declined    Past Surgical History:  Procedure Laterality Date   ANTERIOR FUSION CERVICAL SPINE  2018   CESAREAN SECTION     x 2   CESAREAN SECTION  09/29/2012   Procedure: CESAREAN SECTION;  Surgeon: Turner Daniels, MD;  Location: [facility];  Service: Obstetrics;  Laterality: N/A;  Repeat edc 10/12/12/REQUEST;Chassity,Dee,Colleen   CHOLECYSTECTOMY N/A 02/26/2024   Procedure: LAPAROSCOPIC CHOLECYSTECTOMY  INTRAOPERATIVE CHOLANGIOGRAM;   Surgeon: Karie Soda, MD;  Location: [facility];  Service: General;  Laterality: N/A;   COLONOSCOPY     ESOPHAGOGASTRODUODENOSCOPY  normal    7/12   JOINT REPLACEMENT Bilateral    LAPAROSCOPIC VAGINAL HYSTERECTOMY WITH SALPINGECTOMY Bilateral 02/23/2016   Procedure: LAPAROSCOPIC ASSISTED VAGINAL HYSTERECTOMY WITH bilateral SALPINGECTOMY, right oophorectomy. laporotic repair of incidental cystotomy.;  Surgeon: Candice Camp, MD;  Location: [facility];  Service: Gynecology;  Laterality: Bilateral;   MOUTH SURGERY      Family History  Problem Relation Age of Onset   Depression Mother    Hypertension Mother    Diabetes Mother    Obesity Mother    Thyroid disease Mother    Cancer Father        ? lung CA   Kidney disease Father    Hydrocephalus Sister    Colon cancer Neg Hx    Esophageal cancer Neg Hx    Liver cancer Neg Hx    Pancreatic cancer Neg Hx  Rectal cancer Neg Hx    Stomach cancer Neg Hx     Allergies  Allergen Reactions   Azithromycin Diarrhea and Nausea And Vomiting   Dilaudid [Hydromorphone] Nausea Only    Current Outpatient Medications on File Prior to Visit  Medication Sig Dispense Refill   ALPRAZolam (XANAX) 0.5 MG tablet Take 0.5 mg by mouth 3 (three) times daily as needed for anxiety.     aspirin EC 81 MG tablet Take 81 mg by mouth in the morning. Swallow whole.     buPROPion (WELLBUTRIN XL) 300 MG 24 hr tablet Take 300 mg by mouth every morning.     busPIRone (BUSPAR) 15 MG tablet TAKE 1 TABLET BY MOUTH TWICE A DAY (Patient taking differently: Take 15 mg by mouth in the morning.) 180 tablet 2   Cholecalciferol (VITAMIN D3) 1000 units CAPS Take 1,000 Units by mouth daily.     ezetimibe (ZETIA) 10 MG tablet Take 1 tablet (10 mg total) by mouth daily. 90 tablet 3   Ferrous Sulfate (SLOW FE PO) Take 1 tablet by mouth daily with breakfast.     HYDROcodone-acetaminophen (NORCO/VICODIN) 5-325 MG tablet Take 1 tablet by mouth every 6 (six) hours as needed for severe pain  (pain score 7-10) or moderate pain (pain score 4-6). 20 tablet 0   Insulin Pen Needle (B-D ULTRAFINE III SHORT PEN) 31G X 8 MM MISC USE AS DIRECTED TO INJECT SAXENDA 100 each 3   LYSINE PO Take 1 capsule by mouth daily.     metFORMIN (GLUCOPHAGE) 500 MG tablet TAKE 1 TABLET BY MOUTH TWICE A DAY WITH FOOD (Patient taking differently: Take 500 mg by mouth daily with breakfast.) 180 tablet 0   omeprazole (PRILOSEC) 20 MG capsule Take 1 capsule (20 mg total) by mouth daily. (Patient taking differently: Take 20 mg by mouth daily as needed (for heartburn).) 30 capsule 0   ondansetron (ZOFRAN-ODT) 4 MG disintegrating tablet Take 1 tablet (4 mg total) by mouth every 8 (eight) hours as needed. (Patient taking differently: Take 4 mg by mouth every 8 (eight) hours as needed for nausea or vomiting (dissolve orally).) 8 tablet 0   Prenatal Vit-Fe Fumarate-FA (PRENATAL MULTIVITAMIN) TABS tablet Take 1 tablet by mouth daily at 12 noon.     prochlorperazine (COMPAZINE) 5 MG tablet Take 1-2 tablets (5-10 mg total) by mouth every 6 (six) hours as needed for nausea, vomiting or refractory nausea / vomiting. 15 tablet 2   Semaglutide-Weight Management (WEGOVY) 0.25 MG/0.5ML SOAJ INJECT 0.25 MG SUBCUTANEOUSLY ONE TIME PER WEEK (Patient taking differently: Inject 0.25 mg into the skin every Sunday.) 0.5 mL 1   Specialty Vitamins Products (ADVANCED COLLAGEN PO) Take 1 tablet by mouth daily.     Zinc 50 MG TABS Take 50 mg by mouth daily.     zolpidem (AMBIEN) 10 MG tablet Take 5-10 mg by mouth at bedtime as needed for sleep.     No current facility-administered medications on file prior to visit.    BP 118/70   Pulse 84   Temp 98 F (36.7 C)   Resp 18   Ht 5\' 2"  (1.575 m)   Wt 160 lb (72.6 kg)   LMP 02/11/2016 (Exact Date)   SpO2 99%   BMI 29.26 kg/m        Objective:   Physical Exam  General Mental Status- Alert. General Appearance- Not in acute distress.   Skin General: Color- Normal Color. Moisture-  Normal Moisture.  Neck No JVD.  Chest  and Lung Exam Auscultation: Breath Sounds CTA  Cardiovascular Auscultation:Rythm- RRR Murmurs & Other Heart Sounds:Auscultation of the heart reveals- No Murmurs.  Abdomen Inspection:-Inspeection Normal. Palpation/Percussion:Note:No mass. Palpation and Percussion of the abdomen reveal- Non Tender, Non Distended + BS, no rebound or guarding.   Neurologic Cranial Nerve exam:- CN III-XII intact(No nystagmus), symmetric smile. Strength:- 5/5 equal and symmetric strength both upper and lower extremities.       Assessment & Plan:   Post-cholecystectomy recovery with cough. Recovering from laparoscopic cholecystectomy. Concern for possible post-operative pneumonia due cough. - Order chest X-ray if symptoms persist.  Ovarian cyst 5.5 cm ovarian cyst with associated lower right groin pain. Discussed rupture and torsion risks. - Refer to Dr. Rana Snare for further evaluation. - Consider NSAIDs for pain management, caution with blood pressure. - Use Tylenol and low-dose ibuprofen for pain if needed.  Hypertension Blood pressure well-controlled with chlorthalidone. Considering losartan due to dehydration concerns.  - Start losartan with omeprazole.(discussed benefits vs risk) - Monitor blood pressure and report via MyChart after 7-10 days. - Discontinue chlorthalidone if losartan is tolerated.  Weight management with [facility] post-surgery. Significant weight loss noted. Cautious restart due to pancreatitis risk. - Restart Wegovy in 2 weeks at low dose. - Monitor for side effects and report immediately. - Check lipase levels prior to restarting.  General Health Maintenance Interested in lipid monitoring and overall health maintenance. - Order lipid panel as future lab.   Follow up date to be determined after labs done and bp update  Esperanza Richters, PA-C    Time spent with patient today was 42  minutes which consisted of chart  revdew, discussing diagnosis, work up ,treatment and documentation.

## 2024-03-07 ENCOUNTER — Inpatient Hospital Stay: Admitting: Medical

## 2024-03-20 DIAGNOSIS — N951 Menopausal and female climacteric states: Secondary | ICD-10-CM | POA: Diagnosis not present

## 2024-03-20 DIAGNOSIS — N83202 Unspecified ovarian cyst, left side: Secondary | ICD-10-CM | POA: Diagnosis not present

## 2024-03-22 ENCOUNTER — Ambulatory Visit (INDEPENDENT_AMBULATORY_CARE_PROVIDER_SITE_OTHER): Admitting: Family Medicine

## 2024-03-22 VITALS — BP 122/64 | HR 92 | Temp 98.4°F | Resp 16 | Ht 62.0 in | Wt 163.2 lb

## 2024-03-22 DIAGNOSIS — Z780 Asymptomatic menopausal state: Secondary | ICD-10-CM | POA: Diagnosis not present

## 2024-03-22 DIAGNOSIS — F5104 Psychophysiologic insomnia: Secondary | ICD-10-CM

## 2024-03-22 NOTE — Progress Notes (Signed)
 [facility] [address] [place], [place] [address] [phone]  Date:  03/22/2024   Name:  Ann Frederick   DOB:  10/01/1971   MRN:  [id number]  PCP:  Esperanza Richters, PA-C    Chief Complaint: Menopause   History of Present Illness:  Ann Frederick is a 53 y.o. very pleasant female patient who presents with the following:  Patient seen today for follow-up visit-she wants to discuss menopausal symptoms and hormone therapy Most recently seen by myself 8/23 She is a primary pt of Esperanza Richters- she saw him 3/11:  Post-cholecystectomy recovery with cough. Recovering from laparoscopic cholecystectomy. Concern for possible post-operative pneumonia due cough. - Order chest X-ray if symptoms persist. Ovarian cyst 5.5 cm ovarian cyst with associated lower right groin pain. Discussed rupture and torsion risks. - Refer to Dr. Rana Snare for further evaluation. - Consider NSAIDs for pain management, caution with blood pressure. - Use Tylenol and low-dose ibuprofen for pain if needed. Hypertension Blood pressure well-controlled with chlorthalidone. Considering losartan due to dehydration concerns.  - Start losartan with omeprazole.(discussed benefits vs risk) - Monitor blood pressure and report via MyChart after 7-10 days. - Discontinue chlorthalidone if losartan is tolerated. Weight management with [facility] post-surgery. Significant weight loss noted. Cautious restart due to pancreatitis risk. - Restart Wegovy in 2 weeks at low dose. - Monitor for side effects and report immediately. - Check lipase levels prior to restarting.  Pt is concerned about menopause She has been seeing her GYN Dr Rana Snare; they note her [facility] was quite elevated recently.  She expressed some concerns about poor sleep and brain fog.  She has been on Ambien as a sleep aid long-term.  However, she is not really dealing with hot flashes.  Dr Rana Snare gave her a  prescription for estrogen and she has some questions about it  Patient Active Problem List   Diagnosis Date Noted   Acute on chronic cholecystitis 02/25/2024   Cholecystitis 02/25/2024   Left hip pain 08/27/2019   Class 1 obesity due to excess calories without serious comorbidity with body mass index (BMI) of 34.0 to 34.9 in adult 11/28/2018   Heart palpitations 11/28/2018   OSA (obstructive sleep apnea) 09/14/2018   Neck pain 04/26/2018   Wellness examination 03/08/2016   S/P laparoscopic assisted vaginal hysterectomy (LAVH) 02/23/2016   Pink eye 06/19/2015   Pain of right thumb 06/19/2015   Sinusitis, acute frontal 02/05/2015   Headache around the eyes 02/05/2015   Back pain 11/01/2014   Routine general medical examination at a health care facility 10/30/2013   Vesicular rash 09/07/2013   Sinusitis 02/02/2013   Viral pharyngitis 03/03/2012   Flank pain 08/13/2011   Inguinal adenopathy 08/13/2011   Dysphagia 04/20/2011   GERD 01/29/2011   FATIGUE 01/29/2011   SCIATICA, BILATERAL 06/18/2010   LEG PAIN, BILATERAL 06/06/2010   HIP PAIN, LEFT 04/01/2010   MASS, LOCALIZED, SUPERFICIAL 03/05/2009   ACNE VULGARIS 07/11/2007   MURMUR 07/11/2007    Past Medical History:  Diagnosis Date   Acne    Anemia    history of   Anxiety    Arthritis    Depression    GERD (gastroesophageal reflux disease)    worse while pregnant   Headache    Migraines   Heart murmur    ? heart murmur in past   History of blood transfusion 2013   Hypertension    Metabolic  syndrome     Past Surgical History:  Procedure Laterality Date   ANTERIOR FUSION CERVICAL SPINE  2018   CESAREAN SECTION     x 2   CESAREAN SECTION  09/29/2012   Procedure: CESAREAN SECTION;  Surgeon: Turner Daniels, MD;  Location: [facility];  Service: Obstetrics;  Laterality: N/A;  Repeat edc 10/12/12/REQUEST;Chassity,Dee,Colleen   CHOLECYSTECTOMY N/A 02/26/2024   Procedure: LAPAROSCOPIC CHOLECYSTECTOMY  INTRAOPERATIVE  CHOLANGIOGRAM;  Surgeon: Karie Soda, MD;  Location: [facility];  Service: General;  Laterality: N/A;   COLONOSCOPY     ESOPHAGOGASTRODUODENOSCOPY  normal    7/12   JOINT REPLACEMENT Bilateral    LAPAROSCOPIC VAGINAL HYSTERECTOMY WITH SALPINGECTOMY Bilateral 02/23/2016   Procedure: LAPAROSCOPIC ASSISTED VAGINAL HYSTERECTOMY WITH bilateral SALPINGECTOMY, right oophorectomy. laporotic repair of incidental cystotomy.;  Surgeon: Candice Camp, MD;  Location: [facility];  Service: Gynecology;  Laterality: Bilateral;   MOUTH SURGERY      Social History   Tobacco Use   Smoking status: Never   Smokeless tobacco: Never  Vaping Use   Vaping status: Never Used  Substance Use Topics   Alcohol use: Yes    Comment: 1 glass of wine per week prior to preg   Drug use: No    Family History  Problem Relation Age of Onset   Depression Mother    Hypertension Mother    Diabetes Mother    Obesity Mother    Thyroid disease Mother    Cancer Father        ? lung CA   Kidney disease Father    Hydrocephalus Sister    Colon cancer Neg Hx    Esophageal cancer Neg Hx    Liver cancer Neg Hx    Pancreatic cancer Neg Hx    Rectal cancer Neg Hx    Stomach cancer Neg Hx     Allergies  Allergen Reactions   Azithromycin Diarrhea and Nausea And Vomiting   Dilaudid [Hydromorphone] Nausea Only    Medication list has been reviewed and updated.  Current Outpatient Medications on File Prior to Visit  Medication Sig Dispense Refill   ALPRAZolam (XANAX) 0.5 MG tablet Take 0.5 mg by mouth 3 (three) times daily as needed for anxiety.     aspirin EC 81 MG tablet Take 81 mg by mouth in the morning. Swallow whole.     buPROPion (WELLBUTRIN XL) 300 MG 24 hr tablet Take 300 mg by mouth every morning.     busPIRone (BUSPAR) 15 MG tablet TAKE 1 TABLET BY MOUTH TWICE A DAY (Patient taking differently: Take 15 mg by mouth in the morning.) 180 tablet 2   Cholecalciferol (VITAMIN D3) 1000 units CAPS Take 1,000 Units by mouth  daily.     ezetimibe (ZETIA) 10 MG tablet Take 1 tablet (10 mg total) by mouth daily. 90 tablet 3   Ferrous Sulfate (SLOW FE PO) Take 1 tablet by mouth daily with breakfast.     losartan (COZAAR) 25 MG tablet Take 25 mg by mouth daily.     LYSINE PO Take 1 capsule by mouth daily.     metFORMIN (GLUCOPHAGE) 500 MG tablet TAKE 1 TABLET BY MOUTH TWICE A DAY WITH FOOD (Patient taking differently: Take 500 mg by mouth daily with breakfast.) 180 tablet 0   omeprazole (PRILOSEC) 20 MG capsule Take 1 capsule (20 mg total) by mouth daily. (Patient taking differently: Take 20 mg by mouth daily as needed (for heartburn).) 30 capsule 0   ondansetron (ZOFRAN-ODT) 4 MG disintegrating  tablet Take 1 tablet (4 mg total) by mouth every 8 (eight) hours as needed. (Patient taking differently: Take 4 mg by mouth every 8 (eight) hours as needed for nausea or vomiting (dissolve orally).) 8 tablet 0   Prenatal Vit-Fe Fumarate-FA (PRENATAL MULTIVITAMIN) TABS tablet Take 1 tablet by mouth daily at 12 noon.     prochlorperazine (COMPAZINE) 5 MG tablet Take 1-2 tablets (5-10 mg total) by mouth every 6 (six) hours as needed for nausea, vomiting or refractory nausea / vomiting. 15 tablet 2   Semaglutide-Weight Management (WEGOVY) 0.25 MG/0.5ML SOAJ INJECT 0.25 MG SUBCUTANEOUSLY ONE TIME PER WEEK (Patient taking differently: Inject 0.25 mg into the skin every Sunday.) 0.5 mL 1   Specialty Vitamins Products (ADVANCED COLLAGEN PO) Take 1 tablet by mouth daily.     UNABLE TO FIND Med Name: KETAMINE 250MG  ODT RASPBERRY     Zinc 50 MG TABS Take 50 mg by mouth daily.     zolpidem (AMBIEN) 10 MG tablet Take 5-10 mg by mouth at bedtime as needed for sleep.     estradiol (ESTRACE) 2 MG tablet Take 2 mg by mouth daily. (Patient not taking: Reported on 03/22/2024)     No current facility-administered medications on file prior to visit.    Review of Systems:  As per HPI- otherwise negative.   Physical Examination: Vitals:   03/22/24  1555  BP: 122/64  Pulse: 92  Resp: 16  Temp: 98.4 F (36.9 C)  SpO2: 98%   Vitals:   03/22/24 1555  Weight: 163 lb 3.2 oz (74 kg)  Height: 5\' 2"  (1.575 m)   Body mass index is 29.85 kg/m. Ideal Body Weight: Weight in (lb) to have BMI = 25: 136.4  GEN: no acute distress.  Looks well, mild overweight HEENT: Atraumatic, Normocephalic.  Ears and Nose: No external deformity. CV: RRR, No M/G/R. No JVD. No thrill. No extra heart sounds. PULM: CTA B, no wheezes, crackles, rhonchi. No retractions. No resp. distress. No accessory muscle use. ABD: S, NT, ND, +BS. No rebound. No HSM. EXTR: No c/c/e PSYCH: Normally interactive. Conversant.    Assessment and Plan: Chronic insomnia  Menopause  Patient seen today with questions about medication she was recently given by her gynecologist.  She had complained of poor sleep and brain fog, she chronically uses Ambien as she has always had insomnia.  Her gynecologist gave her a prescription for estrogen but she had some questions about it.  She wonders if this will be helpful for her even though she does not have hot flashes.  She wonders about going up on her dose of Ambien instead.  We discussed this today.  I advised her that menopause can cause sleep difficulty even if hot flashes are not present.  Perhaps she could try the estrogen for a month or so and see how she feels.  If it is not helpful she does not have to continue taking it.  Signed Abbe Amsterdam, MD

## 2024-03-23 ENCOUNTER — Other Ambulatory Visit: Payer: Self-pay | Admitting: Medical

## 2024-03-29 ENCOUNTER — Ambulatory Visit: Admitting: Family Medicine

## 2024-04-12 ENCOUNTER — Telehealth: Admitting: Family Medicine

## 2024-04-12 DIAGNOSIS — J019 Acute sinusitis, unspecified: Secondary | ICD-10-CM

## 2024-04-12 DIAGNOSIS — B9689 Other specified bacterial agents as the cause of diseases classified elsewhere: Secondary | ICD-10-CM | POA: Diagnosis not present

## 2024-04-12 MED ORDER — AMOXICILLIN-POT CLAVULANATE 875-125 MG PO TABS: 1.0000 | ORAL_TABLET | Freq: Two times a day (BID) | ORAL | 0 refills | Status: AC

## 2024-04-12 NOTE — Progress Notes (Signed)

## 2024-05-20 ENCOUNTER — Other Ambulatory Visit: Payer: Self-pay | Admitting: Medical

## 2024-06-08 ENCOUNTER — Ambulatory Visit (INDEPENDENT_AMBULATORY_CARE_PROVIDER_SITE_OTHER): Admitting: Medical

## 2024-06-08 ENCOUNTER — Encounter: Payer: Self-pay | Admitting: Medical

## 2024-06-08 VITALS — BP 116/72 | HR 74 | Ht 62.0 in | Wt 164.6 lb

## 2024-06-08 DIAGNOSIS — M25511 Pain in right shoulder: Secondary | ICD-10-CM | POA: Diagnosis not present

## 2024-06-08 DIAGNOSIS — R739 Hyperglycemia, unspecified: Secondary | ICD-10-CM | POA: Diagnosis not present

## 2024-06-08 DIAGNOSIS — E669 Obesity, unspecified: Secondary | ICD-10-CM

## 2024-06-08 DIAGNOSIS — I10 Essential (primary) hypertension: Secondary | ICD-10-CM | POA: Diagnosis not present

## 2024-06-08 DIAGNOSIS — S46811A Strain of other muscles, fascia and tendons at shoulder and upper arm level, right arm, initial encounter: Secondary | ICD-10-CM

## 2024-06-08 DIAGNOSIS — K219 Gastro-esophageal reflux disease without esophagitis: Secondary | ICD-10-CM | POA: Diagnosis not present

## 2024-06-08 MED ORDER — OMEPRAZOLE 20 MG PO CPDR: 20.0000 mg | DELAYED_RELEASE_CAPSULE | Freq: Every day | ORAL | 3 refills | Status: DC

## 2024-06-08 MED ORDER — WEGOVY 0.5 MG/0.5ML ~~LOC~~ SOAJ: 0.5000 mg | SUBCUTANEOUS | 0 refills | Status: DC

## 2024-06-08 NOTE — Patient Instructions (Addendum)
 Shoulder Pain and trapezius strain. Shoulder pain with pain anterior aspect. Previous dry needling effective.  - Refer for dry needling therapy.  Gastroesophageal Reflux Disease (GERD) GERD symptoms possibly worsened by GLP-1 agonist and cholecystectomy history. No severe symptoms or abnormal lipase. - Prescribe omeprazole . - Advise eating small, frequent meals.  Obesity On GLP-1 agonist with weight loss. No significant side effects. Discussed protein intake and healthy eating. - Increase GLP-1 agonist dose to 0.5 mg if weight loss plateaus. - Monitor for gastrointestinal side effects and hypoglycemia. - Encourage consumption of healthy foods with adequate protein intake.  Hypertension Hypertension well-controlled on losartan . Discussed rare potassium effects and monitoring with diuretic use. - Continue losartan  25 mg. - Monitor potassium levels if diuretic use becomes regular.  Follow up date to be determined after lab review

## 2024-06-08 NOTE — Progress Notes (Signed)
 Subjective:    Patient ID: Ann Frederick, female    DOB: [birth year], 53 y.o.   MRN: [id number]  HPI  Ann Frederick is a 53 year old female who presents with shoulder pain and numbness down the arm.  She has been experiencing shoulder pain and numbness radiating down her arm for about three weeks. The pain is primarily located in the shoulder and trapezius area, described as similar to possible 'frozen shoulder per her report'. She previously found dry needling therapy beneficial in relieving muscle tension, with the last session possibly occurring in the fall of the previous year.  She has a history of neck surgery performed in 2019 but denies any current neck pain or radicular symptoms. She is on GLP-1 therapy since January at a dose of 0.25 mg, with no significant side effects except occasional reflux, for which she takes omeprazole . She had her gallbladder removed in March, with normal lipase levels at that time. She is cautious about her diet, focusing on small meals and adequate protein intake to prevent vomiting post-surgery.  She does have occasional reflux and wants refill of omeprazole .  She is on losartan  25 mg for hypertension and occasionally takes a diuretic for ankle swelling. Her blood pressure is well-controlled. No recurrent neck pain, radicular symptoms, vomiting, severe abdominal pain, or low blood sugar symptoms.        Review of Systems  Constitutional:  Negative for chills, fatigue and fever.  HENT:  Negative for congestion and ear pain.   Respiratory:  Negative for cough, chest tightness, wheezing and stridor.   Cardiovascular:  Negative for chest pain and palpitations.  Gastrointestinal:  Negative for abdominal distention, anal bleeding and constipation.       Occasional gerd.  Genitourinary:  Negative for dysuria, flank pain, frequency and urgency.  Musculoskeletal:  Negative for back pain, joint swelling and neck pain.  Skin:  Negative for rash.   Neurological:  Negative for dizziness, weakness and light-headedness.  Hematological:  Negative for adenopathy. Does not bruise/bleed easily.  Psychiatric/Behavioral:  Negative for behavioral problems, hallucinations, sleep disturbance and suicidal ideas.      Past Medical History:  Diagnosis Date   Acne    Anemia    history of   Anxiety    Arthritis    Depression    GERD (gastroesophageal reflux disease)    worse while pregnant   Headache    Migraines   Heart murmur    ? heart murmur in past   History of blood transfusion 2013   Hypertension    Metabolic syndrome      Social History   Socioeconomic History   Marital status: Married    Spouse name: Not on file   Number of children: 2   Years of education: Not on file   Highest education level: Master's degree (e.g., MA, MS, MEng, MEd, MSW, MBA)  Occupational History   Not on file  Tobacco Use   Smoking status: Never   Smokeless tobacco: Never  Vaping Use   Vaping status: Never Used  Substance and Sexual Activity   Alcohol use: Yes    Comment: 1 glass of wine per week prior to preg   Drug use: No   Sexual activity: Yes  Other Topics Concern   Not on file  Social History Narrative   Not on file   Social Drivers of Health   Financial Resource Strain: Low Risk  (01/06/2024)   Overall Financial Resource Strain (CARDIA)  Difficulty of Paying Living Expenses: Not hard at all  Food Insecurity: Patient Declined (02/25/2024)   Hunger Vital Sign    Worried About Running Out of Food in the Last Year: Patient declined    Ran Out of Food in the Last Year: Patient declined  Transportation Needs: Patient Declined (02/25/2024)   PRAPARE - Administrator, Civil Service (Medical): Patient declined    Lack of Transportation (Non-Medical): Patient declined  Physical Activity: Insufficiently Active (01/06/2024)   Exercise Vital Sign    Days of Exercise per Week: 2 days    Minutes of Exercise per Session: 30 min   Stress: Stress Concern Present (01/06/2024)   [employer] of Occupational Health - Occupational Stress Questionnaire    Feeling of Stress : To some extent  Social Connections: Patient Declined (02/25/2024)   Social Connection and Isolation Panel    Frequency of Communication with Friends and Family: Patient declined    Frequency of Social Gatherings with Friends and Family: Patient declined    Attends Religious Services: Patient declined    Active Member of Clubs or Organizations: Patient declined    Attends Banker Meetings: Patient declined    Marital Status: Patient declined  Intimate Partner Violence: Patient Declined (02/25/2024)   Humiliation, Afraid, Rape, and Kick questionnaire    Fear of Current or Ex-Partner: Patient declined    Emotionally Abused: Patient declined    Physically Abused: Patient declined    Sexually Abused: Patient declined    Past Surgical History:  Procedure Laterality Date   ANTERIOR FUSION CERVICAL SPINE  2018   CESAREAN SECTION     x 2   CESAREAN SECTION  09/29/2012   Procedure: CESAREAN SECTION;  Surgeon: Denette Finner, MD;  Location: [facility];  Service: Obstetrics;  Laterality: N/A;  Repeat edc 10/12/12/REQUEST;Chassity,Dee,Colleen   CHOLECYSTECTOMY N/A 02/26/2024   Procedure: LAPAROSCOPIC CHOLECYSTECTOMY  INTRAOPERATIVE CHOLANGIOGRAM;  Surgeon: Candyce Champagne, MD;  Location: [facility];  Service: General;  Laterality: N/A;   COLONOSCOPY     ESOPHAGOGASTRODUODENOSCOPY  normal    7/12   JOINT REPLACEMENT Bilateral    LAPAROSCOPIC VAGINAL HYSTERECTOMY WITH SALPINGECTOMY Bilateral 02/23/2016   Procedure: LAPAROSCOPIC ASSISTED VAGINAL HYSTERECTOMY WITH bilateral SALPINGECTOMY, right oophorectomy. laporotic repair of incidental cystotomy.;  Surgeon: Belle Box, MD;  Location: [facility];  Service: Gynecology;  Laterality: Bilateral;   MOUTH SURGERY      Family History  Problem Relation Age of Onset   Depression Mother    Hypertension Mother     Diabetes Mother    Obesity Mother    Thyroid  disease Mother    Cancer Father        ? lung CA   Kidney disease Father    Hydrocephalus Sister    Colon cancer Neg Hx    Esophageal cancer Neg Hx    Liver cancer Neg Hx    Pancreatic cancer Neg Hx    Rectal cancer Neg Hx    Stomach cancer Neg Hx     Allergies  Allergen Reactions   Azithromycin  Diarrhea and Nausea And Vomiting   Dilaudid  [Hydromorphone ] Nausea Only    Current Outpatient Medications on File Prior to Visit  Medication Sig Dispense Refill   ALPRAZolam  (XANAX ) 0.5 MG tablet Take 0.5 mg by mouth 3 (three) times daily as needed for anxiety.     aspirin  EC 81 MG tablet Take 81 mg by mouth in the morning. Swallow whole.     buPROPion  (WELLBUTRIN  XL) 300 MG  24 hr tablet Take 300 mg by mouth every morning.     busPIRone  (BUSPAR ) 15 MG tablet TAKE 1 TABLET BY MOUTH TWICE A DAY 180 tablet 2   Cholecalciferol (VITAMIN D3) 1000 units CAPS Take 1,000 Units by mouth daily.     estradiol (ESTRACE) 2 MG tablet Take 2 mg by mouth daily.     ezetimibe  (ZETIA ) 10 MG tablet Take 1 tablet (10 mg total) by mouth daily. 90 tablet 3   Ferrous Sulfate  (SLOW FE PO) Take 1 tablet by mouth daily with breakfast.     losartan  (COZAAR ) 25 MG tablet Take 25 mg by mouth daily.     LYSINE PO Take 1 capsule by mouth daily.     metFORMIN  (GLUCOPHAGE ) 500 MG tablet TAKE 1 TABLET BY MOUTH TWICE A DAY WITH FOOD (Patient taking differently: Take 500 mg by mouth daily with breakfast.) 180 tablet 0   omeprazole  (PRILOSEC) 20 MG capsule Take 1 capsule (20 mg total) by mouth daily. (Patient taking differently: Take 20 mg by mouth daily as needed (for heartburn).) 30 capsule 0   ondansetron  (ZOFRAN -ODT) 4 MG disintegrating tablet Take 1 tablet (4 mg total) by mouth every 8 (eight) hours as needed. (Patient taking differently: Take 4 mg by mouth every 8 (eight) hours as needed for nausea or vomiting (dissolve orally).) 8 tablet 0   Prenatal Vit-Fe Fumarate-FA  (PRENATAL MULTIVITAMIN) TABS tablet Take 1 tablet by mouth daily at 12 noon.     prochlorperazine  (COMPAZINE ) 5 MG tablet Take 1-2 tablets (5-10 mg total) by mouth every 6 (six) hours as needed for nausea, vomiting or refractory nausea / vomiting. 15 tablet 2   Specialty Vitamins Products (ADVANCED COLLAGEN PO) Take 1 tablet by mouth daily.     UNABLE TO FIND Med Name: KETAMINE 250MG  ODT RASPBERRY     Zinc  50 MG TABS Take 50 mg by mouth daily.     zolpidem  (AMBIEN ) 10 MG tablet Take 5-10 mg by mouth at bedtime as needed for sleep.     No current facility-administered medications on file prior to visit.    BP 116/72   Pulse 74   Ht 5' 2 (1.575 m)   Wt 164 lb 9.6 oz (74.7 kg)   LMP 02/11/2016 (Exact Date)   SpO2 97%   BMI 30.11 kg/m        Objective:   Physical Exam  General Mental Status- Alert. General Appearance- Not in acute distress.   Skin General: Color- Normal Color. Moisture- Normal Moisture.  Neck Carotid Arteries- Normal color. Moisture- Normal Moisture. No carotid bruits. No JVD.  Chest and Lung Exam Auscultation: Breath Sounds:-Normal.  Cardiovascular Auscultation:Rythm- Regular. Murmurs & Other Heart Sounds:Auscultation of the heart reveals- No Murmurs.  Abdomen Inspection:-Inspeection Normal. Palpation/Percussion:Note:No mass. Palpation and Percussion of the abdomen reveal- Non Tender, Non Distended + BS, no rebound or guarding.   Neurologic Cranial Nerve exam:- CN III-XII intact(No nystagmus), symmetric smile. Strength:- 5/5 equal and symmetric strength both upper and lower extremities.       Assessment & Plan:   Patient Instructions  Shoulder Pain and trapezius strain. Shoulder pain with pain anterior aspect. Previous dry needling effective.  - Refer for dry needling therapy.  Gastroesophageal Reflux Disease (GERD) GERD symptoms possibly worsened by GLP-1 agonist and cholecystectomy history. No severe symptoms or abnormal lipase. -  Prescribe omeprazole . - Advise eating small, frequent meals.  Obesity On GLP-1 agonist with weight loss. No significant side effects. Discussed protein intake and healthy eating. -  Increase GLP-1 agonist dose to 0.5 mg if weight loss plateaus. - Monitor for gastrointestinal side effects and hypoglycemia. - Encourage consumption of healthy foods with adequate protein intake.  Hypertension Hypertension well-controlled on losartan . Discussed rare potassium effects and monitoring with diuretic use. - Continue losartan  25 mg. - Monitor potassium levels if diuretic use becomes regular.  Follow up date to be determined after lab review   Sylvia Everts, PA-C

## 2024-06-09 ENCOUNTER — Encounter (HOSPITAL_BASED_OUTPATIENT_CLINIC_OR_DEPARTMENT_OTHER): Payer: Self-pay | Admitting: Emergency Medicine

## 2024-06-09 ENCOUNTER — Ambulatory Visit: Payer: Self-pay | Admitting: Medical

## 2024-06-09 ENCOUNTER — Inpatient Hospital Stay (HOSPITAL_BASED_OUTPATIENT_CLINIC_OR_DEPARTMENT_OTHER)
Admission: EM | Admit: 2024-06-09 | Discharge: 2024-06-11 | DRG: 683 | Disposition: A | Discharge status: Home / Self Care | Attending: Internal Medicine | Admitting: Internal Medicine

## 2024-06-09 ENCOUNTER — Telehealth: Admitting: Nurse Practitioner

## 2024-06-09 ENCOUNTER — Other Ambulatory Visit: Payer: Self-pay

## 2024-06-09 ENCOUNTER — Emergency Department (HOSPITAL_BASED_OUTPATIENT_CLINIC_OR_DEPARTMENT_OTHER)

## 2024-06-09 DIAGNOSIS — R109 Unspecified abdominal pain: Secondary | ICD-10-CM | POA: Diagnosis not present

## 2024-06-09 DIAGNOSIS — Z841 Family history of disorders of kidney and ureter: Secondary | ICD-10-CM

## 2024-06-09 DIAGNOSIS — Z833 Family history of diabetes mellitus: Secondary | ICD-10-CM | POA: Diagnosis not present

## 2024-06-09 DIAGNOSIS — N390 Urinary tract infection, site not specified: Secondary | ICD-10-CM | POA: Diagnosis present

## 2024-06-09 DIAGNOSIS — Z90710 Acquired absence of both cervix and uterus: Secondary | ICD-10-CM

## 2024-06-09 DIAGNOSIS — Z801 Family history of malignant neoplasm of trachea, bronchus and lung: Secondary | ICD-10-CM

## 2024-06-09 DIAGNOSIS — I10 Essential (primary) hypertension: Secondary | ICD-10-CM | POA: Diagnosis not present

## 2024-06-09 DIAGNOSIS — E119 Type 2 diabetes mellitus without complications: Secondary | ICD-10-CM | POA: Diagnosis present

## 2024-06-09 DIAGNOSIS — R000 Tachycardia, unspecified: Secondary | ICD-10-CM | POA: Diagnosis not present

## 2024-06-09 DIAGNOSIS — Z9049 Acquired absence of other specified parts of digestive tract: Secondary | ICD-10-CM

## 2024-06-09 DIAGNOSIS — Z8349 Family history of other endocrine, nutritional and metabolic diseases: Secondary | ICD-10-CM | POA: Diagnosis not present

## 2024-06-09 DIAGNOSIS — E8889 Other specified metabolic disorders: Secondary | ICD-10-CM

## 2024-06-09 DIAGNOSIS — K219 Gastro-esophageal reflux disease without esophagitis: Secondary | ICD-10-CM | POA: Diagnosis not present

## 2024-06-09 DIAGNOSIS — Z7985 Long-term (current) use of injectable non-insulin antidiabetic drugs: Secondary | ICD-10-CM

## 2024-06-09 DIAGNOSIS — B001 Herpesviral vesicular dermatitis: Secondary | ICD-10-CM

## 2024-06-09 DIAGNOSIS — E669 Obesity, unspecified: Secondary | ICD-10-CM | POA: Diagnosis not present

## 2024-06-09 DIAGNOSIS — Z8249 Family history of ischemic heart disease and other diseases of the circulatory system: Secondary | ICD-10-CM

## 2024-06-09 DIAGNOSIS — Z79899 Other long term (current) drug therapy: Secondary | ICD-10-CM

## 2024-06-09 DIAGNOSIS — Z7984 Long term (current) use of oral hypoglycemic drugs: Secondary | ICD-10-CM | POA: Diagnosis not present

## 2024-06-09 DIAGNOSIS — Z7982 Long term (current) use of aspirin: Secondary | ICD-10-CM

## 2024-06-09 DIAGNOSIS — F419 Anxiety disorder, unspecified: Secondary | ICD-10-CM | POA: Diagnosis present

## 2024-06-09 DIAGNOSIS — M1990 Unspecified osteoarthritis, unspecified site: Secondary | ICD-10-CM | POA: Diagnosis present

## 2024-06-09 DIAGNOSIS — R1084 Generalized abdominal pain: Secondary | ICD-10-CM | POA: Diagnosis not present

## 2024-06-09 DIAGNOSIS — Z6830 Body mass index (BMI) 30.0-30.9, adult: Secondary | ICD-10-CM

## 2024-06-09 DIAGNOSIS — Z881 Allergy status to other antibiotic agents status: Secondary | ICD-10-CM

## 2024-06-09 DIAGNOSIS — Z818 Family history of other mental and behavioral disorders: Secondary | ICD-10-CM | POA: Diagnosis not present

## 2024-06-09 DIAGNOSIS — N179 Acute kidney failure, unspecified: Secondary | ICD-10-CM | POA: Diagnosis not present

## 2024-06-09 DIAGNOSIS — Z981 Arthrodesis status: Secondary | ICD-10-CM | POA: Diagnosis not present

## 2024-06-09 DIAGNOSIS — Z885 Allergy status to narcotic agent status: Secondary | ICD-10-CM

## 2024-06-09 DIAGNOSIS — Z90721 Acquired absence of ovaries, unilateral: Secondary | ICD-10-CM | POA: Diagnosis not present

## 2024-06-09 LAB — URINALYSIS, ROUTINE W REFLEX MICROSCOPIC
Bilirubin Urine: NEGATIVE
Glucose, UA: NEGATIVE mg/dL
Hgb urine dipstick: NEGATIVE
Ketones, ur: NEGATIVE mg/dL
Leukocytes,Ua: NEGATIVE
Nitrite: NEGATIVE
Protein, ur: 100 mg/dL — AB
Specific Gravity, Urine: 1.01 (ref 1.005–1.030)
pH: 6.5 (ref 5.0–8.0)

## 2024-06-09 LAB — COMPREHENSIVE METABOLIC PANEL WITH GFR
AG Ratio: 1.9 (calc) (ref 1.0–2.5)
ALT: 12 U/L (ref 6–29)
ALT: 11 U/L (ref 0–44)
AST: 14 U/L (ref 10–35)
AST: 19 U/L (ref 15–41)
Albumin: 4.4 g/dL (ref 3.6–5.1)
Albumin: 4.2 g/dL (ref 3.5–5.0)
Alkaline Phosphatase: 61 U/L (ref 38–126)
Alkaline phosphatase (APISO): 58 U/L (ref 37–153)
Anion gap: 11 (ref 5–15)
BUN: 11 mg/dL (ref 7–25)
BUN: 14 mg/dL (ref 6–20)
CO2: 28 mmol/L (ref 20–32)
CO2: 25 mmol/L (ref 22–32)
Calcium: 9.7 mg/dL (ref 8.6–10.4)
Calcium: 9.3 mg/dL (ref 8.9–10.3)
Chloride: 104 mmol/L (ref 98–110)
Chloride: 105 mmol/L (ref 98–111)
Creat: 0.98 mg/dL (ref 0.50–1.03)
Creatinine, Ser: 1.6 mg/dL — ABNORMAL HIGH (ref 0.44–1.00)
GFR, Estimated: 38 mL/min — ABNORMAL LOW (ref >60)
Globulin: 2.3 g/dL (ref 1.9–3.7)
Glucose, Bld: 80 mg/dL (ref 65–99)
Glucose, Bld: 104 mg/dL — ABNORMAL HIGH (ref 70–99)
Potassium: 4.2 mmol/L (ref 3.5–5.3)
Potassium: 4.1 mmol/L (ref 3.5–5.1)
Sodium: 141 mmol/L (ref 135–146)
Sodium: 141 mmol/L (ref 135–145)
Total Bilirubin: 0.3 mg/dL (ref 0.2–1.2)
Total Bilirubin: <0.2 mg/dL (ref 0.0–1.2)
Total Protein: 6.7 g/dL (ref 6.1–8.1)
Total Protein: 6.5 g/dL (ref 6.5–8.1)
eGFR: 69 mL/min/{1.73_m2} (ref >=60)

## 2024-06-09 LAB — CBC WITH DIFFERENTIAL/PLATELET
Abs Immature Granulocytes: 0.06 10*3/uL (ref 0.00–0.07)
Basophils Absolute: 0.1 10*3/uL (ref 0.0–0.1)
Basophils Relative: 1 %
Eosinophils Absolute: 0.3 10*3/uL (ref 0.0–0.5)
Eosinophils Relative: 2 %
HCT: 39.4 % (ref 36.0–46.0)
Hemoglobin: 13 g/dL (ref 12.0–15.0)
Immature Granulocytes: 0 %
Lymphocytes Relative: 17 %
Lymphs Abs: 2.7 10*3/uL (ref 0.7–4.0)
MCH: 31.1 pg (ref 26.0–34.0)
MCHC: 33 g/dL (ref 30.0–36.0)
MCV: 94.3 fL (ref 80.0–100.0)
Monocytes Absolute: 1.1 10*3/uL — ABNORMAL HIGH (ref 0.1–1.0)
Monocytes Relative: 7 %
Neutro Abs: 12.1 10*3/uL — ABNORMAL HIGH (ref 1.7–7.7)
Neutrophils Relative %: 73 %
Platelets: 451 10*3/uL — ABNORMAL HIGH (ref 150–400)
RBC: 4.18 MIL/uL (ref 3.87–5.11)
RDW: 12.2 % (ref 11.5–15.5)
WBC: 16.3 10*3/uL — ABNORMAL HIGH (ref 4.0–10.5)
nRBC: 0 % (ref 0.0–0.2)

## 2024-06-09 LAB — URINALYSIS, MICROSCOPIC (REFLEX)

## 2024-06-09 LAB — HEMOGLOBIN A1C
Hgb A1c MFr Bld: 5 % (ref <5.7)
Mean Plasma Glucose: 97 mg/dL
eAG (mmol/L): 5.4 mmol/L

## 2024-06-09 LAB — LIPASE, BLOOD: Lipase: 44 U/L (ref 11–51)

## 2024-06-09 MED ORDER — DIPHENHYDRAMINE HCL 50 MG/ML IJ SOLN
25.0000 mg | Freq: Once | INTRAMUSCULAR | Status: AC
  Filled 2024-06-09: qty 1

## 2024-06-09 MED ORDER — MORPHINE SULFATE (PF) 4 MG/ML IV SOLN
4.0000 mg | Freq: Once | INTRAVENOUS | Status: AC
  Filled 2024-06-09: qty 1

## 2024-06-09 MED ORDER — DROPERIDOL 2.5 MG/ML IJ SOLN
1.2500 mg | Freq: Once | INTRAMUSCULAR | Status: AC
  Administered 2024-06-09: 1.25 mg via INTRAVENOUS
  Filled 2024-06-09: qty 2

## 2024-06-09 MED ORDER — ONDANSETRON HCL 4 MG/2ML IJ SOLN
4.0000 mg | Freq: Once | INTRAMUSCULAR | Status: AC
  Filled 2024-06-09: qty 2

## 2024-06-09 MED ORDER — IOHEXOL 300 MG/ML  SOLN: 75.0000 mL | Freq: Once | INTRAMUSCULAR | Status: AC | PRN

## 2024-06-09 MED ORDER — SODIUM CHLORIDE 0.9 % IV BOLUS: 1000.0000 mL | Freq: Once | INTRAVENOUS | Status: AC

## 2024-06-09 MED ORDER — MORPHINE SULFATE (PF) 4 MG/ML IV SOLN
6.0000 mg | Freq: Once | INTRAVENOUS | Status: AC
  Filled 2024-06-09: qty 2

## 2024-06-09 MED ORDER — KETAMINE HCL 10 MG/ML IJ SOLN
15.0000 mg | Freq: Once | INTRAMUSCULAR | Status: AC
  Filled 2024-06-09: qty 1

## 2024-06-09 MED ORDER — VALACYCLOVIR HCL 1 G PO TABS: 2000.0000 mg | ORAL_TABLET | Freq: Two times a day (BID) | ORAL | 0 refills | Status: AC

## 2024-06-09 MED ORDER — MORPHINE SULFATE (PF) 4 MG/ML IV SOLN: 8.0000 mg | Freq: Once | INTRAVENOUS | Status: DC

## 2024-06-09 NOTE — Progress Notes (Signed)
 Hospitalist Transfer Note:    Nursing staff, Please call [facility] Admits & Consults System-Wide number on Amion [phone]) as soon as patient's arrival, so appropriate admitting provider can evaluate the pt.   Transferring facility: [facility] Requesting provider: Dr. Inga Manges (EDP at [facility]) Reason for transfer: admission for further evaluation and management of aki and bilateral flank pain.   53  year old female who presented to [facility] ED complaining of 1 to 2 days of bilateral flank discomfort.  Labs show acute kidney injury, with presenting creatinine 1.6, relative to her baseline of 0.8.  Per my discussions with the EDP, patient continues to produce urine.  CT abdomen/pelvis show evidence of bilateral perirenal fat stranding, but no evidence of hydronephrosis nor any evidence of ureteral stone.  Overall, no radiographic or clinical evidence to suggest overt urinary outflow obstruction at this time.  Urinalysis was reported to be not consistent with UTI.  Medications administered prior to transfer included the following: Multiple doses of IV pain medication to address her bilateral flank discomfort.  IV fluids.   Subsequently, I accepted this patient for transfer for observation to a med/tele bed at [facility] (in case urology input is subsequently needed) for further work-up and management of the above.      Camelia Cavalier, DO Hospitalist

## 2024-06-09 NOTE — ED Notes (Signed)
 Called floor at [facility] and let them know [facility] is in route and to sign handoff

## 2024-06-09 NOTE — Progress Notes (Signed)

## 2024-06-09 NOTE — ED Provider Notes (Signed)
 [place] [facility] Provider Note   CSN: [id number] Arrival date & time: 06/09/24  7829     Patient presents with: Abdominal Pain   Ann Frederick is a 53 y.o. female.   53 yo F with a chief complaints of diffuse abdominal pain.  This been going on for about 4 hours now.  Started suddenly.  Perhaps some nausea but no vomiting.  No fevers.  No diarrhea.  No urinary symptoms.  She had looked up her medication that she takes for diabetes and was concerned that she may have pancreatitis.  Came to the ED for evaluation.  Pain in her flank bilaterally and wraps around to the abdomen.  Seems to come and go.   Abdominal Pain      Prior to Admission medications   Medication Sig Start Date End Date Taking? Authorizing Provider  ALPRAZolam  (XANAX ) 0.5 MG tablet Take 0.5 mg by mouth 3 (three) times daily as needed for anxiety.    [provider]  aspirin  EC 81 MG tablet Take 81 mg by mouth in the morning. Swallow whole.    [provider]  buPROPion  (WELLBUTRIN  XL) 300 MG 24 hr tablet Take 300 mg by mouth every morning. 08/29/20   [provider]  busPIRone  (BUSPAR ) 15 MG tablet TAKE 1 TABLET BY MOUTH TWICE A DAY 03/23/24   Saguier, Gaylin Ke, PA-C  Cholecalciferol (VITAMIN D3) 1000 units CAPS Take 1,000 Units by mouth daily.    [provider]  estradiol (ESTRACE) 2 MG tablet Take 2 mg by mouth daily. 03/21/24   [provider]  ezetimibe  (ZETIA ) 10 MG tablet Take 1 tablet (10 mg total) by mouth daily. 11/19/23   Saguier, Gaylin Ke, PA-C  Ferrous Sulfate  (SLOW FE PO) Take 1 tablet by mouth daily with breakfast.    [provider]  losartan  (COZAAR ) 25 MG tablet Take 25 mg by mouth daily. 03/19/24   [provider]  LYSINE PO Take 1 capsule by mouth daily.    [provider]  metFORMIN  (GLUCOPHAGE ) 500 MG tablet TAKE 1 TABLET BY MOUTH TWICE A DAY WITH FOOD Patient taking differently: Take 500 mg  by mouth daily with breakfast. 12/12/23   Saguier, Gaylin Ke, PA-C  omeprazole  (PRILOSEC) 20 MG capsule Take 1 capsule (20 mg total) by mouth daily. Patient taking differently: Take 20 mg by mouth daily as needed (for heartburn). 06/25/23   Palumbo, April, MD  omeprazole  (PRILOSEC) 20 MG capsule Take 1 capsule (20 mg total) by mouth daily. 06/08/24   Saguier, Gaylin Ke, PA-C  ondansetron  (ZOFRAN -ODT) 4 MG disintegrating tablet Take 1 tablet (4 mg total) by mouth every 8 (eight) hours as needed. Patient taking differently: Take 4 mg by mouth every 8 (eight) hours as needed for nausea or vomiting (dissolve orally). 02/25/24   Eldon Greenland, MD  Prenatal Vit-Fe Fumarate-FA (PRENATAL MULTIVITAMIN) TABS tablet Take 1 tablet by mouth daily at 12 noon.    [provider]  prochlorperazine  (COMPAZINE ) 5 MG tablet Take 1-2 tablets (5-10 mg total) by mouth every 6 (six) hours as needed for nausea, vomiting or refractory nausea / vomiting. 02/26/24   Candyce Champagne, MD  Semaglutide -Weight Management (WEGOVY ) 0.5 MG/0.5ML SOAJ Inject 0.5 mg into the skin once a week. 06/08/24   Saguier, Gaylin Ke, PA-C  Specialty Vitamins Products (ADVANCED COLLAGEN PO) Take 1 tablet by mouth daily.    [provider]  UNABLE TO FIND Med Name: KETAMINE 250MG  ODT RASPBERRY    [provider]  valACYclovir  (VALTREX ) 1000 MG tablet Take 2 tablets (2,000 mg total) by mouth 2 (two) times daily. 06/09/24   Fleming, Zelda W, NP  Zinc  50 MG TABS Take 50 mg by mouth daily.    [provider]  zolpidem  (AMBIEN ) 10 MG tablet Take 5-10 mg by mouth at bedtime as needed for sleep.    [provider]    Allergies: Azithromycin  and Dilaudid  [hydromorphone ]    Review of Systems  Gastrointestinal:  Positive for abdominal pain.    Updated Vital Signs BP (!) 149/78 (BP Location: Right Arm)   Pulse (!) 119   Temp 97.8 F (36.6 C) (Oral)   Resp 18   LMP 02/11/2016 (Exact Date)   SpO2 99%   Physical  Exam Vitals and nursing note reviewed.  Constitutional:      General: She is not in acute distress.    Appearance: She is well-developed. She is not diaphoretic.  HENT:     Head: Normocephalic and atraumatic.   Eyes:     Pupils: Pupils are equal, round, and reactive to light.    Cardiovascular:     Rate and Rhythm: Regular rhythm. Tachycardia present.     Heart sounds: No murmur heard.    No friction rub. No gallop.  Pulmonary:     Effort: Pulmonary effort is normal.     Breath sounds: No wheezing or rales.  Abdominal:     General: There is no distension.     Palpations: Abdomen is soft.     Tenderness: There is abdominal tenderness.     Comments: Pain diffusely about the abdomen   Musculoskeletal:        General: No tenderness.     Cervical back: Normal range of motion and neck supple.   Skin:    General: Skin is warm and dry.   Neurological:     Mental Status: She is alert and oriented to person, place, and time.   Psychiatric:        Behavior: Behavior normal.     (all labs ordered are listed, but only abnormal results are displayed) Labs Reviewed  CBC WITH DIFFERENTIAL/PLATELET - Abnormal; Notable for the following components:      Result Value   WBC 16.3 (*)    Platelets 451 (*)    Neutro Abs 12.1 (*)    Monocytes Absolute 1.1 (*)    All other components within normal limits  COMPREHENSIVE METABOLIC PANEL WITH GFR - Abnormal; Notable for the following components:   Glucose, Bld 104 (*)    Creatinine, Ser 1.60 (*)    GFR, Estimated 38 (*)    All other components within normal limits  URINALYSIS, ROUTINE W REFLEX MICROSCOPIC - Abnormal; Notable for the following components:   Protein, ur 100 (*)    All other components within normal limits  URINALYSIS, MICROSCOPIC (REFLEX) - Abnormal; Notable for the following components:   Bacteria, UA FEW (*)    All other components within normal limits  LIPASE, BLOOD    EKG: EKG  Interpretation Date/Time:  Saturday June 09 2024 18:38:49 EDT Ventricular Rate:  114 PR Interval:  163 QRS Duration:  77 QT Interval:  308 QTC Calculation: 425 R Axis:   78  Text Interpretation: Sinus tachycardia Baseline wander in lead(s) V3 Since last tracing rate faster Otherwise no significant change Confirmed by Albertus Hughs [phone]) on 06/09/2024 6:44:27 PM  Radiology: CT ABDOMEN PELVIS W CONTRAST Result Date: 06/09/2024 EXAM: CT ABDOMEN AND PELVIS  WITH CONTRAST 06/09/2024 08:13:14 PM TECHNIQUE: CT of the abdomen and pelvis was performed with the administration of 75mL iohexol  (OMNIPAQUE ) 300 MG/ML solution. Multiplanar reformatted images are provided for review. Automated exposure control, iterative reconstruction, and/or weight based adjustment of the mA/kV was utilized to reduce the radiation dose to as low as reasonably achievable. COMPARISON: 02/25/2024 CLINICAL HISTORY: Abdominal pain, acute, nonlocalized. Pt c/o pain across middle back and upper abd, radiating down both sides of abd/trunk that started about 4 hrs ago; has taken Robaxin  and Motrin  w/o relief. FINDINGS: LOWER CHEST: No acute abnormality. LIVER: The liver is unremarkable. GALLBLADDER AND BILE DUCTS: Status post cholecystectomy. No biliary ductal dilatation. SPLEEN: No acute abnormality. PANCREAS: No acute abnormality. ADRENAL GLANDS: No acute abnormality. KIDNEYS, URETERS AND BLADDER: No stones in the kidneys or ureters. No hydronephrosis. Bilateral perirenal stranding / soft tissue (image 24), new from prior. Primary differential considerations include perirenal lymphoma or Erdheim-Chester disease. Urinary bladder is unremarkable. GI AND BOWEL: Stomach demonstrates no acute abnormality. There is no bowel obstruction. No bowel wall thickening. PERITONEUM AND RETROPERITONEUM: No ascites. No free air. VASCULATURE: Aorta is normal in caliber. LYMPH NODES: No lymphadenopathy. REPRODUCTIVE ORGANS: Status post hysterectomy. BONES AND  SOFT TISSUES: Bilateral hip arthroplasties, without evidence of complication. No acute osseous abnormality. No focal soft tissue abnormality. IMPRESSION: 1. Bilateral perirenal stranding/soft tissue, new from prior. Primary differential considerations include perirenal lymphoma or Erdheim-Chester disease. 2. Otherwise negative. Electronically signed by: Zadie Herter MD 06/09/2024 08:27 PM EDT RP Workstation: [id number]     Procedures   Medications Ordered in the ED  morphine  (PF) 4 MG/ML injection 8 mg (8 mg Intravenous Not Given 06/09/24 2052)  sodium chloride  0.9 % bolus 1,000 mL (has no administration in time range)  sodium chloride  0.9 % bolus 1,000 mL (0 mLs Intravenous Stopped 06/09/24 2047)  morphine  (PF) 4 MG/ML injection 4 mg (4 mg Intravenous Given 06/09/24 1905)  ondansetron  (ZOFRAN ) injection 4 mg (4 mg Intravenous Given 06/09/24 1906)  morphine  (PF) 4 MG/ML injection 6 mg (6 mg Intravenous Given 06/09/24 1955)  iohexol  (OMNIPAQUE ) 300 MG/ML solution 75 mL (75 mLs Intravenous Contrast Given 06/09/24 2000)  ketamine (KETALAR) injection 15 mg (15 mg Intravenous Given 06/09/24 2047)  ondansetron  (ZOFRAN ) injection 4 mg (4 mg Intravenous Given 06/09/24 2114)  droperidol (INAPSINE) 2.5 MG/ML injection 1.25 mg (1.25 mg Intravenous Given 06/09/24 2122)  diphenhydrAMINE  (BENADRYL ) injection 25 mg (25 mg Intravenous Given 06/09/24 2122)                                    Medical Decision Making Amount and/or Complexity of Data Reviewed Labs: ordered. Radiology: ordered.  Risk Prescription drug management. Decision regarding hospitalization.   53 yo F with a chief complaints of diffuse abdominal pain.  Going on for about 4 hours now.  Started suddenly.  Tachycardic on arrival.  Will give a bolus of IV fluids.  Treat pain and nausea.  CT imaging.  Patient has acute kidney injury.  Leukocytosis.  CT imaging concerning with bilateral perirenal stranding.  No signs of urinary outlet  obstruction.  Radiology read with possible lymphoma or histiocytosis.  Patient having uncontrolled pain here.  Will discuss with medicine.  The patients results and plan were reviewed and discussed.   Any x-rays performed were independently reviewed by myself.   Differential diagnosis were considered with the presenting HPI.  Medications  morphine  (PF) 4 MG/ML injection  8 mg (8 mg Intravenous Not Given 06/09/24 2052)  sodium chloride  0.9 % bolus 1,000 mL (has no administration in time range)  sodium chloride  0.9 % bolus 1,000 mL (0 mLs Intravenous Stopped 06/09/24 2047)  morphine  (PF) 4 MG/ML injection 4 mg (4 mg Intravenous Given 06/09/24 1905)  ondansetron  (ZOFRAN ) injection 4 mg (4 mg Intravenous Given 06/09/24 1906)  morphine  (PF) 4 MG/ML injection 6 mg (6 mg Intravenous Given 06/09/24 1955)  iohexol  (OMNIPAQUE ) 300 MG/ML solution 75 mL (75 mLs Intravenous Contrast Given 06/09/24 2000)  ketamine (KETALAR) injection 15 mg (15 mg Intravenous Given 06/09/24 2047)  ondansetron  (ZOFRAN ) injection 4 mg (4 mg Intravenous Given 06/09/24 2114)  droperidol (INAPSINE) 2.5 MG/ML injection 1.25 mg (1.25 mg Intravenous Given 06/09/24 2122)  diphenhydrAMINE  (BENADRYL ) injection 25 mg (25 mg Intravenous Given 06/09/24 2122)    Vitals:   06/09/24 1836  BP: (!) 149/78  Pulse: (!) 119  Resp: 18  Temp: 97.8 F (36.6 C)  TempSrc: Oral  SpO2: 99%    Final diagnoses:  AKI (acute kidney injury) (HCC)    Admission/ observation were discussed with the admitting physician, patient and/or family and they are comfortable with the plan.       Final diagnoses:  AKI (acute kidney injury) [facility])    ED Discharge Orders     None          Albertus Hughs, DO 06/09/24 2233

## 2024-06-09 NOTE — ED Notes (Signed)
 [facility] called for transport.

## 2024-06-09 NOTE — Progress Notes (Signed)
 I have spent 5 minutes in review of e-visit questionnaire, review and updating patient chart, medical decision making and response to patient.   Claiborne Rigg, NP

## 2024-06-09 NOTE — ED Triage Notes (Signed)
 Pt  c/o pain across middle back and upper abd, radiating down both sides of abd/trunk that started about 4 hrs ago; has taken Robaxin  and Motrin  w/o relief

## 2024-06-09 NOTE — ED Notes (Signed)
 Patient transported to CT

## 2024-06-10 DIAGNOSIS — Z7984 Long term (current) use of oral hypoglycemic drugs: Secondary | ICD-10-CM | POA: Diagnosis not present

## 2024-06-10 DIAGNOSIS — E119 Type 2 diabetes mellitus without complications: Secondary | ICD-10-CM | POA: Diagnosis present

## 2024-06-10 DIAGNOSIS — Z981 Arthrodesis status: Secondary | ICD-10-CM | POA: Diagnosis not present

## 2024-06-10 DIAGNOSIS — N179 Acute kidney failure, unspecified: Secondary | ICD-10-CM

## 2024-06-10 DIAGNOSIS — E669 Obesity, unspecified: Secondary | ICD-10-CM | POA: Diagnosis present

## 2024-06-10 DIAGNOSIS — R1084 Generalized abdominal pain: Secondary | ICD-10-CM | POA: Diagnosis present

## 2024-06-10 DIAGNOSIS — Z7982 Long term (current) use of aspirin: Secondary | ICD-10-CM | POA: Diagnosis not present

## 2024-06-10 DIAGNOSIS — F419 Anxiety disorder, unspecified: Secondary | ICD-10-CM | POA: Diagnosis present

## 2024-06-10 DIAGNOSIS — Z801 Family history of malignant neoplasm of trachea, bronchus and lung: Secondary | ICD-10-CM | POA: Diagnosis not present

## 2024-06-10 DIAGNOSIS — M1990 Unspecified osteoarthritis, unspecified site: Secondary | ICD-10-CM | POA: Diagnosis present

## 2024-06-10 DIAGNOSIS — K219 Gastro-esophageal reflux disease without esophagitis: Secondary | ICD-10-CM | POA: Diagnosis present

## 2024-06-10 DIAGNOSIS — Z881 Allergy status to other antibiotic agents status: Secondary | ICD-10-CM | POA: Diagnosis not present

## 2024-06-10 DIAGNOSIS — Z8249 Family history of ischemic heart disease and other diseases of the circulatory system: Secondary | ICD-10-CM | POA: Diagnosis not present

## 2024-06-10 DIAGNOSIS — Z833 Family history of diabetes mellitus: Secondary | ICD-10-CM | POA: Diagnosis not present

## 2024-06-10 DIAGNOSIS — I10 Essential (primary) hypertension: Secondary | ICD-10-CM | POA: Diagnosis present

## 2024-06-10 DIAGNOSIS — N390 Urinary tract infection, site not specified: Secondary | ICD-10-CM | POA: Diagnosis present

## 2024-06-10 DIAGNOSIS — Z885 Allergy status to narcotic agent status: Secondary | ICD-10-CM | POA: Diagnosis not present

## 2024-06-10 DIAGNOSIS — Z7985 Long-term (current) use of injectable non-insulin antidiabetic drugs: Secondary | ICD-10-CM | POA: Diagnosis not present

## 2024-06-10 DIAGNOSIS — Z90710 Acquired absence of both cervix and uterus: Secondary | ICD-10-CM | POA: Diagnosis not present

## 2024-06-10 DIAGNOSIS — Z841 Family history of disorders of kidney and ureter: Secondary | ICD-10-CM | POA: Diagnosis not present

## 2024-06-10 DIAGNOSIS — Z6830 Body mass index (BMI) 30.0-30.9, adult: Secondary | ICD-10-CM | POA: Diagnosis not present

## 2024-06-10 DIAGNOSIS — Z79899 Other long term (current) drug therapy: Secondary | ICD-10-CM | POA: Diagnosis not present

## 2024-06-10 DIAGNOSIS — Z8349 Family history of other endocrine, nutritional and metabolic diseases: Secondary | ICD-10-CM | POA: Diagnosis not present

## 2024-06-10 DIAGNOSIS — Z90721 Acquired absence of ovaries, unilateral: Secondary | ICD-10-CM | POA: Diagnosis not present

## 2024-06-10 DIAGNOSIS — Z818 Family history of other mental and behavioral disorders: Secondary | ICD-10-CM | POA: Diagnosis not present

## 2024-06-10 LAB — BASIC METABOLIC PANEL WITH GFR
Anion gap: 9 (ref 5–15)
Anion gap: 6 (ref 5–15)
Anion gap: 8 (ref 5–15)
BUN: 16 mg/dL (ref 6–20)
BUN: 17 mg/dL (ref 6–20)
BUN: 16 mg/dL (ref 6–20)
CO2: 21 mmol/L — ABNORMAL LOW (ref 22–32)
CO2: 23 mmol/L (ref 22–32)
CO2: 24 mmol/L (ref 22–32)
Calcium: 8.4 mg/dL — ABNORMAL LOW (ref 8.9–10.3)
Calcium: 8.3 mg/dL — ABNORMAL LOW (ref 8.9–10.3)
Calcium: 8.3 mg/dL — ABNORMAL LOW (ref 8.9–10.3)
Chloride: 110 mmol/L (ref 98–111)
Chloride: 107 mmol/L (ref 98–111)
Chloride: 106 mmol/L (ref 98–111)
Creatinine, Ser: 2.31 mg/dL — ABNORMAL HIGH (ref 0.44–1.00)
Creatinine, Ser: 2.49 mg/dL — ABNORMAL HIGH (ref 0.44–1.00)
Creatinine, Ser: 2.44 mg/dL — ABNORMAL HIGH (ref 0.44–1.00)
GFR, Estimated: 25 mL/min — ABNORMAL LOW (ref >60)
GFR, Estimated: 23 mL/min — ABNORMAL LOW (ref >60)
GFR, Estimated: 23 mL/min — ABNORMAL LOW (ref >60)
Glucose, Bld: 143 mg/dL — ABNORMAL HIGH (ref 70–99)
Glucose, Bld: 130 mg/dL — ABNORMAL HIGH (ref 70–99)
Glucose, Bld: 96 mg/dL (ref 70–99)
Potassium: 4.4 mmol/L (ref 3.5–5.1)
Potassium: 4.3 mmol/L (ref 3.5–5.1)
Potassium: 4 mmol/L (ref 3.5–5.1)
Sodium: 140 mmol/L (ref 135–145)
Sodium: 136 mmol/L (ref 135–145)
Sodium: 138 mmol/L (ref 135–145)

## 2024-06-10 LAB — CBC
HCT: 41.8 % (ref 36.0–46.0)
HCT: 38 % (ref 36.0–46.0)
Hemoglobin: 13.3 g/dL (ref 12.0–15.0)
Hemoglobin: 12.1 g/dL (ref 12.0–15.0)
MCH: 31.3 pg (ref 26.0–34.0)
MCH: 31 pg (ref 26.0–34.0)
MCHC: 31.8 g/dL (ref 30.0–36.0)
MCHC: 31.8 g/dL (ref 30.0–36.0)
MCV: 98.4 fL (ref 80.0–100.0)
MCV: 97.4 fL (ref 80.0–100.0)
Platelets: 430 10*3/uL — ABNORMAL HIGH (ref 150–400)
Platelets: 425 10*3/uL — ABNORMAL HIGH (ref 150–400)
RBC: 4.25 MIL/uL (ref 3.87–5.11)
RBC: 3.9 MIL/uL (ref 3.87–5.11)
RDW: 12.3 % (ref 11.5–15.5)
RDW: 12.4 % (ref 11.5–15.5)
WBC: 15.7 10*3/uL — ABNORMAL HIGH (ref 4.0–10.5)
WBC: 14.5 10*3/uL — ABNORMAL HIGH (ref 4.0–10.5)
nRBC: 0 % (ref 0.0–0.2)
nRBC: 0 % (ref 0.0–0.2)

## 2024-06-10 LAB — URINALYSIS, W/ REFLEX TO CULTURE (INFECTION SUSPECTED)
Bacteria, UA: NONE SEEN
Bilirubin Urine: NEGATIVE
Glucose, UA: 50 mg/dL — AB
Hgb urine dipstick: NEGATIVE
Ketones, ur: NEGATIVE mg/dL
Leukocytes,Ua: NEGATIVE
Nitrite: NEGATIVE
Protein, ur: 100 mg/dL — AB
Specific Gravity, Urine: 1.017 (ref 1.005–1.030)
pH: 6 (ref 5.0–8.0)

## 2024-06-10 LAB — MAGNESIUM: Magnesium: 1.9 mg/dL (ref 1.7–2.4)

## 2024-06-10 LAB — PHOSPHORUS: Phosphorus: 3.1 mg/dL (ref 2.5–4.6)

## 2024-06-10 LAB — LACTIC ACID, PLASMA
Lactic Acid, Venous: 1.5 mmol/L (ref 0.5–1.9)
Lactic Acid, Venous: 2.4 mmol/L (ref 0.5–1.9)
Lactic Acid, Venous: 1.9 mmol/L (ref 0.5–1.9)

## 2024-06-10 MED ORDER — AMLODIPINE BESYLATE 5 MG PO TABS
5.0000 mg | ORAL_TABLET | Freq: Every day | ORAL | Status: DC
  Administered 2024-06-10 – 2024-06-11 (×2): 5 mg via ORAL
  Filled 2024-06-10 (×2): qty 1

## 2024-06-10 MED ORDER — POLYETHYLENE GLYCOL 3350 17 G PO PACK: 17.0000 g | PACK | Freq: Every day | ORAL | Status: DC | PRN

## 2024-06-10 MED ORDER — OXYCODONE HCL 5 MG PO TABS
5.0000 mg | ORAL_TABLET | Freq: Four times a day (QID) | ORAL | Status: DC | PRN
  Administered 2024-06-10: 5 mg via ORAL
  Filled 2024-06-10: qty 1

## 2024-06-10 MED ORDER — ACETAMINOPHEN 325 MG PO TABS: 650.0000 mg | ORAL_TABLET | Freq: Four times a day (QID) | ORAL | Status: DC | PRN

## 2024-06-10 MED ORDER — SODIUM CHLORIDE 0.9 % IV SOLN
1.0000 g | INTRAVENOUS | Status: DC
  Administered 2024-06-10 – 2024-06-11 (×2): 1 g via INTRAVENOUS
  Filled 2024-06-10 (×2): qty 10

## 2024-06-10 MED ORDER — HYDROMORPHONE HCL 1 MG/ML IJ SOLN: 0.5000 mg | INTRAMUSCULAR | Status: DC | PRN

## 2024-06-10 MED ORDER — LACTATED RINGERS IV SOLN: INTRAVENOUS | Status: AC

## 2024-06-10 MED ORDER — BUSPIRONE HCL 5 MG PO TABS
15.0000 mg | ORAL_TABLET | Freq: Every day | ORAL | Status: DC
  Administered 2024-06-10 – 2024-06-11 (×2): 15 mg via ORAL
  Filled 2024-06-10 (×2): qty 3

## 2024-06-10 MED ORDER — MORPHINE SULFATE (PF) 2 MG/ML IV SOLN: 2.0000 mg | INTRAVENOUS | Status: DC | PRN

## 2024-06-10 MED ORDER — ENOXAPARIN SODIUM 30 MG/0.3ML IJ SOSY: 30.0000 mg | PREFILLED_SYRINGE | INTRAMUSCULAR | Status: DC

## 2024-06-10 MED ORDER — ENOXAPARIN SODIUM 40 MG/0.4ML IJ SOSY
40.0000 mg | PREFILLED_SYRINGE | INTRAMUSCULAR | Status: DC
  Administered 2024-06-10: 40 mg via SUBCUTANEOUS
  Filled 2024-06-10 (×2): qty 0.4

## 2024-06-10 MED ORDER — BUPROPION HCL ER (XL) 300 MG PO TB24
300.0000 mg | ORAL_TABLET | Freq: Every morning | ORAL | Status: DC
  Administered 2024-06-11: 300 mg via ORAL
  Filled 2024-06-10: qty 1

## 2024-06-10 MED ORDER — PROCHLORPERAZINE EDISYLATE 10 MG/2ML IJ SOLN
5.0000 mg | Freq: Four times a day (QID) | INTRAMUSCULAR | Status: DC | PRN
  Administered 2024-06-10 (×3): 5 mg via INTRAVENOUS
  Filled 2024-06-10 (×3): qty 2

## 2024-06-10 MED ORDER — MELATONIN 5 MG PO TABS
5.0000 mg | ORAL_TABLET | Freq: Every evening | ORAL | Status: DC | PRN
  Administered 2024-06-10: 5 mg via ORAL
  Filled 2024-06-10: qty 1

## 2024-06-10 NOTE — TOC Initial Note (Signed)
 Transition of Care [facility]) - Initial/Assessment Note    Patient Details  Name: Ann Frederick MRN: [id number] Date of Birth: 1971-11-15  [facility] CM/SW Contact:    Levie Ream, RN Phone Number: 06/10/2024, 2:52 PM  Clinical Narrative:                 Eldora Greet w/ pt in room; pt says she lives at home w/ her family; she plans to return at d/c; pt identified POC Maryetta Shafer (husband) [phone]; he will provide transportation; pt verified insurance/PCP; she denied SDOH risks; pt says she does not have DME, HH service, or home oxygen; no TOC needs identified; TOC is signing off; please place consult if needed.  Expected Discharge Plan: Home/Self Care Barriers to Discharge: Continued Medical Work up   Patient Goals and CMS Choice Patient states their goals for this hospitalization and ongoing recovery are:: home          Expected Discharge Plan and Services   Discharge Planning Services: CM Consult   Living arrangements for the past 2 months: Single Family Home                                      Prior Living Arrangements/Services Living arrangements for the past 2 months: Single Family Home Lives with:: Spouse Patient language and need for interpreter reviewed:: Yes Do you feel safe going back to the place where you live?: Yes      Need for Family Participation in Patient Care: Yes (Comment) Care giver support system in place?: Yes (comment) Current home services:  (n/a) Criminal Activity/Legal Involvement Pertinent to Current Situation/Hospitalization: No - Comment as needed  Activities of Daily Living   ADL Screening (condition at time of admission) Independently performs ADLs?: Yes (appropriate for developmental age) Is the patient deaf or have difficulty hearing?: No Does the patient have difficulty seeing, even when wearing glasses/contacts?: No Does the patient have difficulty concentrating, remembering, or making decisions?:  No  Permission Sought/Granted Permission sought to share information with : Case Manager Permission granted to share information with : Yes, Verbal Permission Granted  Share Information with NAME: Case Manager     Permission granted to share info w Relationship: Sissi Padia (spouse) [phone]     Emotional Assessment Appearance:: Appears stated age Attitude/Demeanor/Rapport: Gracious Affect (typically observed): Accepting Orientation: : Oriented to Self, Oriented to Place, Oriented to  Time, Oriented to Situation Alcohol / Substance Use: Not Applicable Psych Involvement: No (comment)  Admission diagnosis:  AKI (acute kidney injury) (HCC) [N17.9] Patient Active Problem List   Diagnosis Date Noted   AKI (acute kidney injury) (HCC) 06/09/2024   Acute on chronic cholecystitis 02/25/2024   Cholecystitis 02/25/2024   Left hip pain 08/27/2019   Class 1 obesity due to excess calories without serious comorbidity with body mass index (BMI) of 34.0 to 34.9 in adult 11/28/2018   Heart palpitations 11/28/2018   OSA (obstructive sleep apnea) 09/14/2018   Neck pain 04/26/2018   Wellness examination 03/08/2016   S/P laparoscopic assisted vaginal hysterectomy (LAVH) 02/23/2016   Pink eye 06/19/2015   Pain of right thumb 06/19/2015   Sinusitis, acute frontal 02/05/2015   Headache around the eyes 02/05/2015   Back pain 11/01/2014   Routine general medical examination at a health care facility 10/30/2013   Vesicular rash 09/07/2013   Sinusitis 02/02/2013   Viral pharyngitis 03/03/2012   Flank  pain 08/13/2011   Inguinal adenopathy 08/13/2011   Dysphagia 04/20/2011   GERD 01/29/2011   FATIGUE 01/29/2011   SCIATICA, BILATERAL 06/18/2010   LEG PAIN, BILATERAL 06/06/2010   HIP PAIN, LEFT 04/01/2010   MASS, LOCALIZED, SUPERFICIAL 03/05/2009   ACNE VULGARIS 07/11/2007   MURMUR 07/11/2007   PCP:  Sylvia Everts, PA-C Pharmacy:   [id number] - [place], [place] - [address]  AT ACROSS FROM [address] [place] [address] Phone: [phone] Fax: [phone]     Social Drivers of Health (SDOH) Social History: SDOH Screenings   Food Insecurity: No Food Insecurity (06/10/2024)  Housing: Low Risk  (06/10/2024)  Transportation Needs: No Transportation Needs (06/10/2024)  Utilities: Not At Risk (06/10/2024)  Alcohol Screen: Low Risk  (01/06/2024)  Depression (PHQ2-9): Low Risk  (03/29/2023)  Financial Resource Strain: Low Risk  (01/06/2024)  Physical Activity: Insufficiently Active (01/06/2024)  Social Connections: Patient Declined (02/25/2024)  Stress: Stress Concern Present (01/06/2024)  Tobacco Use: Low Risk  (06/09/2024)   SDOH Interventions: Food Insecurity Interventions: Intervention Not Indicated, Inpatient TOC Housing Interventions: Intervention Not Indicated, Inpatient TOC Transportation Interventions: Intervention Not Indicated, Inpatient TOC Utilities Interventions: Intervention Not Indicated, Inpatient TOC   Readmission Risk Interventions     No data to display

## 2024-06-10 NOTE — H&P (Signed)
 History and Physical  Ann Frederick:[id number] DOB: Sep 30, 1971 DOA: 06/09/2024  Referring physician: Accepted by Dr. Brock Canner, [facility], hospitalist service. PCP: Francine Iron  Outpatient Specialists: General surgery, hematology/oncology. Patient coming from: Home through [facility] ED.  Chief Complaint: Abdominal pain and flank pain.  HPI: Ann Frederick is a 53 y.o. female with medical history significant for chronic thrombocytosis, obesity, on GLP-1 agonist, hypertension, GERD, recently started on PPI, omeprazole , who presents to the ER with complaints of acute lower abdominal pain radiating to her flank bilaterally that started about 4 hours prior to presentation.  Associated with nausea and no vomiting.  No reported subjective fevers or chills.  No diarrhea.  She had looked up the side effect from her GLP1 agonist and was concerned about possible side effect.  Presented to [facility] ER for further evaluation.  In the ER, tachycardic and tachypneic with significant abdominal pain and flank pain bilaterally.  Urine analysis showing few bacteria.  CT abdomen pelvis with contrast revealed bilateral perirenal stranding/soft tissue, new from prior.  Primary differential considerations include perirenal lymphoma or Erdheim-Chester disease.    Creatinine elevated with concern for AKI.  The patient received 2 L IV fluid boluses, IV antiemetics, and IV opiate-based analgesics in the ER.  Due to persistent symptomatology, EDP requested admission for further management.  Admitted and accepted by Dr. Brock Canner, [facility], hospitalist service, and transferred to [facility] telemetry unit as observation status  ED Course: Temperature 97.9.  BP 155/95, pulse 107, respiration rate 23, O2 saturation 100% on room air.  Lab studies notable for WBC 16.3, platelet count 451, neutrophil count 12.1.  Creatinine 1.60 with normal baseline.  Review of Systems: Review of systems as noted in the HPI. All other systems  reviewed and are negative.   Past Medical History:  Diagnosis Date   Acne    Anemia    history of   Anxiety    Arthritis    Depression    GERD (gastroesophageal reflux disease)    worse while pregnant   Headache    Migraines   Heart murmur    ? heart murmur in past   History of blood transfusion 2013   Hypertension    Metabolic syndrome    Past Surgical History:  Procedure Laterality Date   ANTERIOR FUSION CERVICAL SPINE  2018   CESAREAN SECTION     x 2   CESAREAN SECTION  09/29/2012   Procedure: CESAREAN SECTION;  Surgeon: Denette Finner, MD;  Location: [facility] ORS;  Service: Obstetrics;  Laterality: N/A;  Repeat edc 10/12/12/REQUEST;Chassity,Dee,Colleen   CHOLECYSTECTOMY N/A 02/26/2024   Procedure: LAPAROSCOPIC CHOLECYSTECTOMY  INTRAOPERATIVE CHOLANGIOGRAM;  Surgeon: Candyce Champagne, MD;  Location: [facility] ORS;  Service: General;  Laterality: N/A;   COLONOSCOPY     ESOPHAGOGASTRODUODENOSCOPY  normal    7/12   JOINT REPLACEMENT Bilateral    LAPAROSCOPIC VAGINAL HYSTERECTOMY WITH SALPINGECTOMY Bilateral 02/23/2016   Procedure: LAPAROSCOPIC ASSISTED VAGINAL HYSTERECTOMY WITH bilateral SALPINGECTOMY, right oophorectomy. laporotic repair of incidental cystotomy.;  Surgeon: Belle Box, MD;  Location: [facility] ORS;  Service: Gynecology;  Laterality: Bilateral;   MOUTH SURGERY      Social History:  reports that she has never smoked. She has never used smokeless tobacco. She reports current alcohol use. She reports that she does not use drugs.   Allergies  Allergen Reactions   Azithromycin  Diarrhea and Nausea And Vomiting   Dilaudid  [Hydromorphone ] Nausea Only    Family History  Problem Relation Age of Onset  Depression Mother    Hypertension Mother    Diabetes Mother    Obesity Mother    Thyroid  disease Mother    Cancer Father        ? lung CA   Kidney disease Father    Hydrocephalus Sister    Colon cancer Neg Hx    Esophageal cancer Neg Hx    Liver cancer Neg Hx    Pancreatic  cancer Neg Hx    Rectal cancer Neg Hx    Stomach cancer Neg Hx       Prior to Admission medications   Medication Sig Start Date End Date Taking? Authorizing Provider  ALPRAZolam  (XANAX ) 0.5 MG tablet Take 0.5 mg by mouth 3 (three) times daily as needed for anxiety.    [provider]  aspirin  EC 81 MG tablet Take 81 mg by mouth in the morning. Swallow whole.    [provider]  buPROPion  (WELLBUTRIN  XL) 300 MG 24 hr tablet Take 300 mg by mouth every morning. 08/29/20   [provider]  busPIRone  (BUSPAR ) 15 MG tablet TAKE 1 TABLET BY MOUTH TWICE A DAY 03/23/24   Saguier, Gaylin Ke, PA-C  Cholecalciferol (VITAMIN D3) 1000 units CAPS Take 1,000 Units by mouth daily.    [provider]  estradiol (ESTRACE) 2 MG tablet Take 2 mg by mouth daily. 03/21/24   [provider]  ezetimibe  (ZETIA ) 10 MG tablet Take 1 tablet (10 mg total) by mouth daily. 11/19/23   Saguier, Gaylin Ke, PA-C  Ferrous Sulfate  (SLOW FE PO) Take 1 tablet by mouth daily with breakfast.    [provider]  losartan  (COZAAR ) 25 MG tablet Take 25 mg by mouth daily. 03/19/24   [provider]  LYSINE PO Take 1 capsule by mouth daily.    [provider]  metFORMIN  (GLUCOPHAGE ) 500 MG tablet TAKE 1 TABLET BY MOUTH TWICE A DAY WITH FOOD Patient taking differently: Take 500 mg by mouth daily with breakfast. 12/12/23   Saguier, Gaylin Ke, PA-C  omeprazole  (PRILOSEC) 20 MG capsule Take 1 capsule (20 mg total) by mouth daily. Patient taking differently: Take 20 mg by mouth daily as needed (for heartburn). 06/25/23   Palumbo, April, MD  omeprazole  (PRILOSEC) 20 MG capsule Take 1 capsule (20 mg total) by mouth daily. 06/08/24   Saguier, Gaylin Ke, PA-C  ondansetron  (ZOFRAN -ODT) 4 MG disintegrating tablet Take 1 tablet (4 mg total) by mouth every 8 (eight) hours as needed. Patient taking differently: Take 4 mg by mouth every 8 (eight) hours as needed for nausea or vomiting (dissolve  orally). 02/25/24   Eldon Greenland, MD  Prenatal Vit-Fe Fumarate-FA (PRENATAL MULTIVITAMIN) TABS tablet Take 1 tablet by mouth daily at 12 noon.    [provider]  prochlorperazine  (COMPAZINE ) 5 MG tablet Take 1-2 tablets (5-10 mg total) by mouth every 6 (six) hours as needed for nausea, vomiting or refractory nausea / vomiting. 02/26/24   Candyce Champagne, MD  Semaglutide -Weight Management (WEGOVY ) 0.5 MG/0.5ML SOAJ Inject 0.5 mg into the skin once a week. 06/08/24   Saguier, Gaylin Ke, PA-C  Specialty Vitamins Products (ADVANCED COLLAGEN PO) Take 1 tablet by mouth daily.    [provider]  UNABLE TO FIND Med Name: KETAMINE 250MG  ODT RASPBERRY    [provider]  valACYclovir  (VALTREX ) 1000 MG tablet Take 2 tablets (2,000 mg total) by mouth 2 (two) times daily. 06/09/24   Fleming, Zelda W, NP  Zinc  50 MG TABS Take 50 mg by mouth daily.  [provider]  zolpidem  (AMBIEN ) 10 MG tablet Take 5-10 mg by mouth at bedtime as needed for sleep.    [provider]    Physical Exam: BP (!) 155/95 (BP Location: Left Arm)   Pulse (!) 107   Temp 97.9 F (36.6 C) (Oral)   Resp 18   Ht 5' 2 (1.575 m)   Wt 76.1 kg   LMP 02/11/2016 (Exact Date)   SpO2 100%   BMI 30.67 kg/m   General: 53 y.o. year-old female well developed well nourished in no acute distress.  Alert and oriented x3. Cardiovascular: Regular rate and rhythm with no rubs or gallops.  No thyromegaly or JVD noted.  No lower extremity edema. 2/4 pulses in all 4 extremities. Respiratory: Clear to auscultation with no wheezes or rales. Good inspiratory effort. Abdomen: Soft flank tenderness bilaterally nondistended with normal bowel sounds x4 quadrants. Muskuloskeletal: No cyanosis, clubbing or edema noted bilaterally Neuro: CN II-XII intact, strength, sensation, reflexes Skin: No ulcerative lesions noted or rashes Psychiatry: Judgement and insight appear normal. Mood is appropriate for condition and  setting          Labs on Admission:  Basic Metabolic Panel: Recent Labs  Lab 06/08/24 1622 06/09/24 1848  NA 141 141  K 4.2 4.1  CL 104 105  CO2 28 25  GLUCOSE 80 104*  BUN 11 14  CREATININE 0.98 1.60*  CALCIUM  9.7 9.3   Liver Function Tests: Recent Labs  Lab 06/08/24 1622 06/09/24 1848  AST 14 19  ALT 12 11  ALKPHOS  --  61  BILITOT 0.3 <0.2  PROT 6.7 6.5  ALBUMIN  --  4.2   Recent Labs  Lab 06/09/24 1848  LIPASE 44   No results for input(s): AMMONIA in the last 168 hours. CBC: Recent Labs  Lab 06/09/24 1848  WBC 16.3*  NEUTROABS 12.1*  HGB 13.0  HCT 39.4  MCV 94.3  PLT 451*   Cardiac Enzymes: No results for input(s): CKTOTAL, CKMB, CKMBINDEX, TROPONINI in the last 168 hours.  BNP (last 3 results) No results for input(s): BNP in the last 8760 hours.  ProBNP (last 3 results) No results for input(s): PROBNP in the last 8760 hours.  CBG: No results for input(s): GLUCAP in the last 168 hours.  Radiological Exams on Admission: CT ABDOMEN PELVIS W CONTRAST Result Date: 06/09/2024 EXAM: CT ABDOMEN AND PELVIS WITH CONTRAST 06/09/2024 08:13:14 PM TECHNIQUE: CT of the abdomen and pelvis was performed with the administration of 75mL iohexol  (OMNIPAQUE ) 300 MG/ML solution. Multiplanar reformatted images are provided for review. Automated exposure control, iterative reconstruction, and/or weight based adjustment of the mA/kV was utilized to reduce the radiation dose to as low as reasonably achievable. COMPARISON: 02/25/2024 CLINICAL HISTORY: Abdominal pain, acute, nonlocalized. Pt c/o pain across middle back and upper abd, radiating down both sides of abd/trunk that started about 4 hrs ago; has taken Robaxin  and Motrin  w/o relief. FINDINGS: LOWER CHEST: No acute abnormality. LIVER: The liver is unremarkable. GALLBLADDER AND BILE DUCTS: Status post cholecystectomy. No biliary ductal dilatation. SPLEEN: No acute abnormality. PANCREAS: No acute  abnormality. ADRENAL GLANDS: No acute abnormality. KIDNEYS, URETERS AND BLADDER: No stones in the kidneys or ureters. No hydronephrosis. Bilateral perirenal stranding / soft tissue (image 24), new from prior. Primary differential considerations include perirenal lymphoma or Erdheim-Chester disease. Urinary bladder is unremarkable. GI AND BOWEL: Stomach demonstrates no acute abnormality. There is no bowel obstruction. No bowel wall thickening. PERITONEUM AND RETROPERITONEUM: No ascites. No free  air. VASCULATURE: Aorta is normal in caliber. LYMPH NODES: No lymphadenopathy. REPRODUCTIVE ORGANS: Status post hysterectomy. BONES AND SOFT TISSUES: Bilateral hip arthroplasties, without evidence of complication. No acute osseous abnormality. No focal soft tissue abnormality. IMPRESSION: 1. Bilateral perirenal stranding/soft tissue, new from prior. Primary differential considerations include perirenal lymphoma or Erdheim-Chester disease. 2. Otherwise negative. Electronically signed by: Zadie Herter MD 06/09/2024 08:27 PM EDT RP Workstation: [id number]    EKG: I independently viewed the EKG done and my findings are as followed: Sinus tachycardia rate of 114.  Nonspecific ST-T changes.  QTc 425  Assessment/Plan Present on Admission:  AKI (acute kidney injury) (HCC)  Principal Problem:   AKI (acute kidney injury) (HCC)  AKI, suspect multifactorial secondary to PPI versus poor oral intake  Hold off home PPI and GLP-1 agonist Start maintenance IV fluid hydration LR at 100 cc/h x 1 day. Monitor urine output Avoid NSAIDs and PPIs Avoid nephrotoxic agents, dehydration, and hypotension Repeat BMP in the morning  Bilateral perirenal stranding, seen on CT scan CT abdomen pelvis with contrast revealed bilateral perirenal stranding/soft tissue, new from prior.  Primary differential considerations include perirenal lymphoma or Erdheim-Chester disease. UA was equivocal, continue Rocephin  until urine culture rules  out out UTI.  SIRS Presented with leukocytosis 16.3 K, tachycardia, tachypnea UA equivocal Follow peripheral blood cultures x 2, lactic acid level, and urine culture Continue Rocephin  until UTI is ruled out.  Hypertension Hold home losartan  for now due to AKI Will start amlodipine 5 mg daily Monitor vital signs  Obesity BMI 30 Recommend weight loss outpatient with regular physical activity and healthy dieting.    Time: 75 minutes.    DVT prophylaxis: Subcu Lovenox  daily.  Code Status: Full code.  Family Communication: The patient's husband and son at bedside.  Disposition Plan: Admitted to telemetry unit.  Consults called: None.  Admission status: Observation status.   Status is: Observation    Bary Boss MD [facility] Pager [phone]  If 7PM-7AM, please contact night-coverage [email or web address] Password [facility]  06/10/2024, 12:03 AM

## 2024-06-10 NOTE — H&P (Incomplete)
 History and Physical  Ann Frederick:[id number] DOB: March 23, 1971 DOA: 06/09/2024  Referring physician: Accepted by Dr. Brock Canner, [facility], hospitalist service. PCP: Sylvia Everts, PA-C  Outpatient Specialists:  Patient coming from: *** & is able to ambulate ***  Chief Complaint: ***   HPI: Ann Frederick is a 53 y.o. female with medical history significant for *** (For level 3, the HPI must include 4+ descriptors: Location, Quality, Severity, Duration, Timing, Context, modifying factors, associated signs/symptoms and/or status of 3+ chronic problems.)   ED Course: ***  Review of Systems: Review of systems as noted in the HPI. All other systems reviewed and are negative.   Past Medical History:  Diagnosis Date  . Acne   . Anemia    history of  . Anxiety   . Arthritis   . Depression   . GERD (gastroesophageal reflux disease)    worse while pregnant  . Headache    Migraines  . Heart murmur    ? heart murmur in past  . History of blood transfusion 2013  . Hypertension   . Metabolic syndrome    Past Surgical History:  Procedure Laterality Date  . ANTERIOR FUSION CERVICAL SPINE  2018  . CESAREAN SECTION     x 2  . CESAREAN SECTION  09/29/2012   Procedure: CESAREAN SECTION;  Surgeon: Denette Finner, MD;  Location: [facility] ORS;  Service: Obstetrics;  Laterality: N/A;  Repeat edc 10/12/12/REQUEST;Chassity,Dee,Colleen  . CHOLECYSTECTOMY N/A 02/26/2024   Procedure: LAPAROSCOPIC CHOLECYSTECTOMY  INTRAOPERATIVE CHOLANGIOGRAM;  Surgeon: Candyce Champagne, MD;  Location: [facility];  Service: General;  Laterality: N/A;  . COLONOSCOPY    . ESOPHAGOGASTRODUODENOSCOPY  normal    7/12  . JOINT REPLACEMENT Bilateral   . LAPAROSCOPIC VAGINAL HYSTERECTOMY WITH SALPINGECTOMY Bilateral 02/23/2016   Procedure: LAPAROSCOPIC ASSISTED VAGINAL HYSTERECTOMY WITH bilateral SALPINGECTOMY, right oophorectomy. laporotic repair of incidental cystotomy.;  Surgeon: Belle Box, MD;  Location: [facility];  Service: Gynecology;   Laterality: Bilateral;  . MOUTH SURGERY      Social History:  reports that she has never smoked. She has never used smokeless tobacco. She reports current alcohol use. She reports that she does not use drugs.   Allergies  Allergen Reactions  . Azithromycin  Diarrhea and Nausea And Vomiting  . Dilaudid  [Hydromorphone ] Nausea Only    Family History  Problem Relation Age of Onset  . Depression Mother   . Hypertension Mother   . Diabetes Mother   . Obesity Mother   . Thyroid  disease Mother   . Cancer Father        ? lung CA  . Kidney disease Father   . Hydrocephalus Sister   . Colon cancer Neg Hx   . Esophageal cancer Neg Hx   . Liver cancer Neg Hx   . Pancreatic cancer Neg Hx   . Rectal cancer Neg Hx   . Stomach cancer Neg Hx     ***  Prior to Admission medications   Medication Sig Start Date End Date Taking? Authorizing Provider  ALPRAZolam  (XANAX ) 0.5 MG tablet Take 0.5 mg by mouth 3 (three) times daily as needed for anxiety.    [provider]  aspirin  EC 81 MG tablet Take 81 mg by mouth in the morning. Swallow whole.    [provider]  buPROPion  (WELLBUTRIN  XL) 300 MG 24 hr tablet Take 300 mg by mouth every morning. 08/29/20   [provider]  busPIRone  (BUSPAR ) 15 MG tablet TAKE 1 TABLET BY MOUTH TWICE A  DAY 03/23/24   Saguier, Gaylin Ke, PA-C  Cholecalciferol (VITAMIN D3) 1000 units CAPS Take 1,000 Units by mouth daily.    [provider]  estradiol (ESTRACE) 2 MG tablet Take 2 mg by mouth daily. 03/21/24   [provider]  ezetimibe  (ZETIA ) 10 MG tablet Take 1 tablet (10 mg total) by mouth daily. 11/19/23   Saguier, Gaylin Ke, PA-C  Ferrous Sulfate  (SLOW FE PO) Take 1 tablet by mouth daily with breakfast.    [provider]  losartan  (COZAAR ) 25 MG tablet Take 25 mg by mouth daily. 03/19/24   [provider]  LYSINE PO Take 1 capsule by mouth daily.    [provider]  metFORMIN  (GLUCOPHAGE ) 500 MG tablet  TAKE 1 TABLET BY MOUTH TWICE A DAY WITH FOOD Patient taking differently: Take 500 mg by mouth daily with breakfast. 12/12/23   Saguier, Gaylin Ke, PA-C  omeprazole  (PRILOSEC) 20 MG capsule Take 1 capsule (20 mg total) by mouth daily. Patient taking differently: Take 20 mg by mouth daily as needed (for heartburn). 06/25/23   Palumbo, April, MD  omeprazole  (PRILOSEC) 20 MG capsule Take 1 capsule (20 mg total) by mouth daily. 06/08/24   Saguier, Gaylin Ke, PA-C  ondansetron  (ZOFRAN -ODT) 4 MG disintegrating tablet Take 1 tablet (4 mg total) by mouth every 8 (eight) hours as needed. Patient taking differently: Take 4 mg by mouth every 8 (eight) hours as needed for nausea or vomiting (dissolve orally). 02/25/24   Eldon Greenland, MD  Prenatal Vit-Fe Fumarate-FA (PRENATAL MULTIVITAMIN) TABS tablet Take 1 tablet by mouth daily at 12 noon.    [provider]  prochlorperazine  (COMPAZINE ) 5 MG tablet Take 1-2 tablets (5-10 mg total) by mouth every 6 (six) hours as needed for nausea, vomiting or refractory nausea / vomiting. 02/26/24   Candyce Champagne, MD  Semaglutide -Weight Management (WEGOVY ) 0.5 MG/0.5ML SOAJ Inject 0.5 mg into the skin once a week. 06/08/24   Saguier, Gaylin Ke, PA-C  Specialty Vitamins Products (ADVANCED COLLAGEN PO) Take 1 tablet by mouth daily.    [provider]  UNABLE TO FIND Med Name: KETAMINE 250MG  ODT RASPBERRY    [provider]  valACYclovir  (VALTREX ) 1000 MG tablet Take 2 tablets (2,000 mg total) by mouth 2 (two) times daily. 06/09/24   Fleming, Zelda W, NP  Zinc  50 MG TABS Take 50 mg by mouth daily.    [provider]  zolpidem  (AMBIEN ) 10 MG tablet Take 5-10 mg by mouth at bedtime as needed for sleep.    [provider]    Physical Exam: BP (!) 155/95 (BP Location: Left Arm)   Pulse (!) 107   Temp 97.9 F (36.6 C) (Oral)   Resp 18   Ht 5' 2 (1.575 m)   Wt 76.1 kg   LMP 02/11/2016 (Exact Date)   SpO2 100%   BMI 30.67 kg/m   General: 53  y.o. year-old female well developed well nourished in no acute distress.  Alert and oriented x3. Cardiovascular: Regular rate and rhythm with no rubs or gallops.  No thyromegaly or JVD noted.  No lower extremity edema. 2/4 pulses in all 4 extremities. Respiratory: Clear to auscultation with no wheezes or rales. Good inspiratory effort. Abdomen: Soft nontender nondistended with normal bowel sounds x4 quadrants. Muskuloskeletal: No cyanosis, clubbing or edema noted bilaterally Neuro: CN II-XII intact, strength, sensation, reflexes Skin: No ulcerative lesions noted or rashes Psychiatry: Judgement and insight appear normal. Mood is appropriate for condition and setting  Labs on Admission:  Basic Metabolic Panel: Recent Labs  Lab 06/08/24 1622 06/09/24 1848  NA 141 141  K 4.2 4.1  CL 104 105  CO2 28 25  GLUCOSE 80 104*  BUN 11 14  CREATININE 0.98 1.60*  CALCIUM  9.7 9.3   Liver Function Tests: Recent Labs  Lab 06/08/24 1622 06/09/24 1848  AST 14 19  ALT 12 11  ALKPHOS  --  61  BILITOT 0.3 <0.2  PROT 6.7 6.5  ALBUMIN  --  4.2   Recent Labs  Lab 06/09/24 1848  LIPASE 44   No results for input(s): AMMONIA in the last 168 hours. CBC: Recent Labs  Lab 06/09/24 1848  WBC 16.3*  NEUTROABS 12.1*  HGB 13.0  HCT 39.4  MCV 94.3  PLT 451*   Cardiac Enzymes: No results for input(s): CKTOTAL, CKMB, CKMBINDEX, TROPONINI in the last 168 hours.  BNP (last 3 results) No results for input(s): BNP in the last 8760 hours.  ProBNP (last 3 results) No results for input(s): PROBNP in the last 8760 hours.  CBG: No results for input(s): GLUCAP in the last 168 hours.  Radiological Exams on Admission: CT ABDOMEN PELVIS W CONTRAST Result Date: 06/09/2024 EXAM: CT ABDOMEN AND PELVIS WITH CONTRAST 06/09/2024 08:13:14 PM TECHNIQUE: CT of the abdomen and pelvis was performed with the administration of 75mL iohexol  (OMNIPAQUE ) 300 MG/ML solution. Multiplanar  reformatted images are provided for review. Automated exposure control, iterative reconstruction, and/or weight based adjustment of the mA/kV was utilized to reduce the radiation dose to as low as reasonably achievable. COMPARISON: 02/25/2024 CLINICAL HISTORY: Abdominal pain, acute, nonlocalized. Pt c/o pain across middle back and upper abd, radiating down both sides of abd/trunk that started about 4 hrs ago; has taken Robaxin  and Motrin  w/o relief. FINDINGS: LOWER CHEST: No acute abnormality. LIVER: The liver is unremarkable. GALLBLADDER AND BILE DUCTS: Status post cholecystectomy. No biliary ductal dilatation. SPLEEN: No acute abnormality. PANCREAS: No acute abnormality. ADRENAL GLANDS: No acute abnormality. KIDNEYS, URETERS AND BLADDER: No stones in the kidneys or ureters. No hydronephrosis. Bilateral perirenal stranding / soft tissue (image 24), new from prior. Primary differential considerations include perirenal lymphoma or Erdheim-Chester disease. Urinary bladder is unremarkable. GI AND BOWEL: Stomach demonstrates no acute abnormality. There is no bowel obstruction. No bowel wall thickening. PERITONEUM AND RETROPERITONEUM: No ascites. No free air. VASCULATURE: Aorta is normal in caliber. LYMPH NODES: No lymphadenopathy. REPRODUCTIVE ORGANS: Status post hysterectomy. BONES AND SOFT TISSUES: Bilateral hip arthroplasties, without evidence of complication. No acute osseous abnormality. No focal soft tissue abnormality. IMPRESSION: 1. Bilateral perirenal stranding/soft tissue, new from prior. Primary differential considerations include perirenal lymphoma or Erdheim-Chester disease. 2. Otherwise negative. Electronically signed by: Zadie Herter MD 06/09/2024 08:27 PM EDT RP Workstation: [id number]    EKG: I independently viewed the EKG done and my findings are as followed: ***   Assessment/Plan Present on Admission: . AKI (acute kidney injury) (HCC)  Principal Problem:   AKI (acute kidney injury)  (HCC)   DVT prophylaxis: ***   Code Status: ***   Family Communication: ***   Disposition Plan: ***   Consults called: ***   Admission status: ***    Status is: Observation {Observation:23811}   Bary Boss MD [facility] Pager [phone]  If 7PM-7AM, please contact night-coverage [email or web address] Password [facility]  06/10/2024, 12:03 AM

## 2024-06-10 NOTE — Progress Notes (Signed)
  PROGRESS NOTE  Patient admitted earlier this morning. See H&P.   Patient admitted with symptoms of abdominal and flank pain.  This started suddenly on Saturday afternoon.  Describes it as a crampy/squeezing constant pain.  In the emergency department, CT abdomen pelvis showed bilateral perirenal stranding.  UA was collected.  She was also found to have AKI, this worsened in setting of CT with contrast also.  Home losartan  on hold  This morning, patient is feeling much better.  Pain has nearly resolved.  She is tolerating p.o.  She really wants to go home.    Repeat creatinine this afternoon is showing plateaued creatinine at 2.44 (baseline 0.98).  She does have good urine output.  She is on empiric Rocephin  for suspected UTI/pyelonephritis.  Would recommend continued treatment with IV fluid and await urine culture results prior to discharge.  If patient is still feeling well and vital signs are stable and lab work is improved, plan for discharge home 6/16.  Status is: Observation The patient will require care spanning > 2 midnights and should be moved to inpatient because: IV fluid   Daren Eck, DO [facility] 06/10/2024, 2:50 PM  Available via Epic secure chat 7am-7pm After these hours, please refer to coverage provider listed on [email or web address]

## 2024-06-11 DIAGNOSIS — E669 Obesity, unspecified: Secondary | ICD-10-CM | POA: Insufficient documentation

## 2024-06-11 DIAGNOSIS — I10 Essential (primary) hypertension: Secondary | ICD-10-CM | POA: Insufficient documentation

## 2024-06-11 DIAGNOSIS — N179 Acute kidney failure, unspecified: Secondary | ICD-10-CM | POA: Diagnosis not present

## 2024-06-11 LAB — BASIC METABOLIC PANEL WITH GFR
Anion gap: 7 (ref 5–15)
BUN: 15 mg/dL (ref 6–20)
CO2: 24 mmol/L (ref 22–32)
Calcium: 8.5 mg/dL — ABNORMAL LOW (ref 8.9–10.3)
Chloride: 108 mmol/L (ref 98–111)
Creatinine, Ser: 2.07 mg/dL — ABNORMAL HIGH (ref 0.44–1.00)
GFR, Estimated: 28 mL/min — ABNORMAL LOW (ref >60)
Glucose, Bld: 98 mg/dL (ref 70–99)
Potassium: 3.8 mmol/L (ref 3.5–5.1)
Sodium: 139 mmol/L (ref 135–145)

## 2024-06-11 LAB — URINE CULTURE: Culture: NO GROWTH

## 2024-06-11 LAB — CBC
HCT: 34.9 % — ABNORMAL LOW (ref 36.0–46.0)
Hemoglobin: 11.2 g/dL — ABNORMAL LOW (ref 12.0–15.0)
MCH: 31.5 pg (ref 26.0–34.0)
MCHC: 32.1 g/dL (ref 30.0–36.0)
MCV: 98 fL (ref 80.0–100.0)
Platelets: 367 10*3/uL (ref 150–400)
RBC: 3.56 MIL/uL — ABNORMAL LOW (ref 3.87–5.11)
RDW: 12.2 % (ref 11.5–15.5)
WBC: 12.5 10*3/uL — ABNORMAL HIGH (ref 4.0–10.5)
nRBC: 0 % (ref 0.0–0.2)

## 2024-06-11 MED ORDER — ENOXAPARIN SODIUM 40 MG/0.4ML IJ SOSY
40.0000 mg | PREFILLED_SYRINGE | INTRAMUSCULAR | Status: DC
  Administered 2024-06-11: 40 mg via SUBCUTANEOUS
  Filled 2024-06-11: qty 0.4

## 2024-06-11 MED ORDER — AMLODIPINE BESYLATE 5 MG PO TABS: 5.0000 mg | ORAL_TABLET | Freq: Every day | ORAL | 0 refills | Status: DC

## 2024-06-11 MED ORDER — CEPHALEXIN 500 MG PO CAPS: 500.0000 mg | ORAL_CAPSULE | Freq: Two times a day (BID) | ORAL | 0 refills | Status: DC

## 2024-06-11 NOTE — Plan of Care (Signed)

## 2024-06-11 NOTE — Discharge Summary (Signed)
 Physician Discharge Summary  Ann Frederick:[id number] DOB: July 14, 1971 DOA: 06/09/2024  PCP: Sylvia Everts, PA-C  Admit date: 06/09/2024 Discharge date: 06/11/2024  Admitted From: Home Disposition: Home  Recommendations for Outpatient Follow-up:  Follow up with PCP in 1 week Repeat BMP to check creatinine in 1 week Follow-up with hematology/oncology.  Referral placed due to findings seen on CT abdomen pelvis --> Bilateral perirenal stranding/soft tissue, new from prior. Primary differential considerations include perirenal lymphoma or Erdheim-Chester disease.  Patient requested to see Dr. Maria Shiner  Discharge Condition: Stable CODE STATUS: Full code Diet recommendation: Regular diet  Brief/Interim Summary: Patient admitted with symptoms of abdominal and flank pain.  This started suddenly on Saturday afternoon.  Describes it as a crampy/squeezing constant pain.  In the emergency department, CT abdomen pelvis showed bilateral perirenal stranding.  UA was collected.  She was also found to have AKI, this worsened in setting of CT with contrast also.  Home losartan  on hold.  Patient had improvement in WBC and creatinine with IV fluid and Rocephin .  Urine culture however was negative (obtained after getting antibiotics).  Patient symptoms resolved and feeling well.  She will follow-up outpatient.  Discharge Diagnoses:   Principal Problem:   AKI (acute kidney injury) (HCC) Active Problems:   HTN (hypertension)   Obesity (BMI 30-39.9) Sepsis ruled out    Discharge Instructions  Discharge Instructions     Ambulatory referral to Hematology / Oncology   Complete by: As directed    Pt requesting to see Dr. Maria Shiner   Call MD for:  difficulty breathing, headache or visual disturbances   Complete by: As directed    Call MD for:  extreme fatigue   Complete by: As directed    Call MD for:  persistant dizziness or light-headedness   Complete by: As directed    Call MD for:  persistant  nausea and vomiting   Complete by: As directed    Call MD for:  severe uncontrolled pain   Complete by: As directed    Call MD for:  temperature >100.4   Complete by: As directed    Diet general   Complete by: As directed    Discharge instructions   Complete by: As directed    You were cared for by a hospitalist during your hospital stay. If you have any questions about your discharge medications or the care you received while you were in the hospital after you are discharged, you can call the unit and ask to speak with the hospitalist on call if the hospitalist that took care of you is not available. Once you are discharged, your primary care physician will handle any further medical issues. Please note that NO REFILLS for any discharge medications will be authorized once you are discharged, as it is imperative that you return to your primary care physician (or establish a relationship with a primary care physician if you do not have one) for your aftercare needs so that they can reassess your need for medications and monitor your lab values.   Increase activity slowly   Complete by: As directed       Allergies as of 06/11/2024       Reactions   Azithromycin  Diarrhea, Nausea And Vomiting   Dilaudid  [hydromorphone ] Nausea Only        Medication List     PAUSE taking these medications    losartan  25 MG tablet Wait to take this until your doctor or other care provider tells you to  start again. Commonly known as: COZAAR  Take 25 mg by mouth daily.       TAKE these medications    amLODipine 5 MG tablet Commonly known as: NORVASC Take 1 tablet (5 mg total) by mouth daily. Start taking on: June 12, 2024   aspirin  EC 81 MG tablet Take 81 mg by mouth in the morning. Swallow whole.   buPROPion  300 MG 24 hr tablet Commonly known as: WELLBUTRIN  XL Take 300 mg by mouth every morning.   busPIRone  15 MG tablet Commonly known as: BUSPAR  TAKE 1 TABLET BY MOUTH TWICE A DAY    cephALEXin  500 MG capsule Commonly known as: KEFLEX  Take 1 capsule (500 mg total) by mouth 2 (two) times daily.   estradiol 2 MG tablet Commonly known as: ESTRACE Take 2 mg by mouth daily.   ezetimibe  10 MG tablet Commonly known as: ZETIA  Take 1 tablet (10 mg total) by mouth daily.   LYSINE PO Take 1 capsule by mouth daily.   metFORMIN  500 MG tablet Commonly known as: GLUCOPHAGE  TAKE 1 TABLET BY MOUTH TWICE A DAY WITH FOOD What changed: when to take this   omeprazole  20 MG capsule Commonly known as: PRILOSEC Take 1 capsule (20 mg total) by mouth daily. What changed:  when to take this reasons to take this   ondansetron  4 MG disintegrating tablet Commonly known as: ZOFRAN -ODT Take 1 tablet (4 mg total) by mouth every 8 (eight) hours as needed. What changed: reasons to take this   prochlorperazine  5 MG tablet Commonly known as: COMPAZINE  Take 1-2 tablets (5-10 mg total) by mouth every 6 (six) hours as needed for nausea, vomiting or refractory nausea / vomiting.   UNABLE TO FIND Med Name: KETAMINE 250MG  ODT RASPBERRY   valACYclovir  1000 MG tablet Commonly known as: Valtrex  Take 2 tablets (2,000 mg total) by mouth 2 (two) times daily.   Vitamin D3 1000 units Caps Take 1,000 Units by mouth daily.   Wegovy  0.25 MG/0.5ML Soaj Generic drug: Semaglutide -Weight Management Inject 0.25 mg into the skin once a week. On Sundays   Zinc  50 MG Tabs Take 50 mg by mouth daily.   zolpidem  10 MG tablet Commonly known as: AMBIEN  Take 5-10 mg by mouth at bedtime as needed for sleep.        Follow-up Information     Saguier, Edward, PA-C. Schedule an appointment as soon as possible for a visit in 1 week(s).   Specialties: Internal Medicine, Family Medicine Why: repeat BMP in 1 week Contact information: [address] [place] [address] [phone]         Ivor Mars, MD Follow up.   Specialty: Oncology Why: referral placed Contact  information: [address] [place] [address] [phone]                Allergies  Allergen Reactions   Azithromycin  Diarrhea and Nausea And Vomiting   Dilaudid  [Hydromorphone ] Nausea Only     Procedures/Studies: CT ABDOMEN PELVIS W CONTRAST Result Date: 06/09/2024 EXAM: CT ABDOMEN AND PELVIS WITH CONTRAST 06/09/2024 08:13:14 PM TECHNIQUE: CT of the abdomen and pelvis was performed with the administration of 75mL iohexol  (OMNIPAQUE ) 300 MG/ML solution. Multiplanar reformatted images are provided for review. Automated exposure control, iterative reconstruction, and/or weight based adjustment of the mA/kV was utilized to reduce the radiation dose to as low as reasonably achievable. COMPARISON: 02/25/2024 CLINICAL HISTORY: Abdominal pain, acute, nonlocalized. Pt c/o pain across middle back  and upper abd, radiating down both sides of abd/trunk that started about 4 hrs ago; has taken Robaxin  and Motrin  w/o relief. FINDINGS: LOWER CHEST: No acute abnormality. LIVER: The liver is unremarkable. GALLBLADDER AND BILE DUCTS: Status post cholecystectomy. No biliary ductal dilatation. SPLEEN: No acute abnormality. PANCREAS: No acute abnormality. ADRENAL GLANDS: No acute abnormality. KIDNEYS, URETERS AND BLADDER: No stones in the kidneys or ureters. No hydronephrosis. Bilateral perirenal stranding / soft tissue (image 24), new from prior. Primary differential considerations include perirenal lymphoma or Erdheim-Chester disease. Urinary bladder is unremarkable. GI AND BOWEL: Stomach demonstrates no acute abnormality. There is no bowel obstruction. No bowel wall thickening. PERITONEUM AND RETROPERITONEUM: No ascites. No free air. VASCULATURE: Aorta is normal in caliber. LYMPH NODES: No lymphadenopathy. REPRODUCTIVE ORGANS: Status post hysterectomy. BONES AND SOFT TISSUES: Bilateral hip arthroplasties, without evidence of complication. No acute osseous abnormality. No focal soft tissue  abnormality. IMPRESSION: 1. Bilateral perirenal stranding/soft tissue, new from prior. Primary differential considerations include perirenal lymphoma or Erdheim-Chester disease. 2. Otherwise negative. Electronically signed by: Zadie Herter MD 06/09/2024 08:27 PM EDT RP Workstation: [id number]      Discharge Exam: Vitals:   06/11/24 0539 06/11/24 0930  BP: (!) 148/92 (!) 156/92  Pulse: (!) 102   Resp: 20   Temp: 99.3 F (37.4 C)   SpO2: 93%     General: Pt is alert, awake, not in acute distress Cardiovascular: RRR, S1/S2 +, no edema Respiratory: CTA bilaterally, no wheezing, no rhonchi, no respiratory distress, no conversational dyspnea  Abdominal: Soft, NT, ND, bowel sounds + Extremities: no edema, no cyanosis Psych: Normal mood and affect, stable judgement and insight     The results of significant diagnostics from this hospitalization (including imaging, microbiology, ancillary and laboratory) are listed below for reference.     Microbiology: Recent Results (from the past 240 hours)  Culture, blood (Routine X 2) w Reflex to ID Panel     Status: None (Preliminary result)   Collection Time: 06/10/24 12:44 AM   Specimen: BLOOD RIGHT ARM  Result Value Ref Range Status   Specimen Description   Final    BLOOD RIGHT ARM Performed at [facility], [address], [place], [place] [address]    Special Requests   Final    BOTTLES DRAWN AEROBIC ONLY Blood Culture results may not be optimal due to an inadequate volume of blood received in culture bottles Performed at [facility], [address] [place], [place] [address]    Culture   Final    NO GROWTH 1 DAY Performed at [facility], [address], [place], [place] [address]    Report Status PENDING  Incomplete  Culture, blood (Routine X 2) w Reflex to ID Panel     Status: None (Preliminary result)   Collection Time: 06/10/24  3:48 AM   Specimen: BLOOD RIGHT ARM  Result Value Ref Range  Status   Specimen Description   Final    BLOOD RIGHT ARM Performed at [facility], [address], [place], [place] [address]    Special Requests   Final    BOTTLES DRAWN AEROBIC AND ANAEROBIC Blood Culture adequate volume Performed at [facility], [address] [place], [place] [address]    Culture   Final    NO GROWTH 1 DAY Performed at [facility], [address] [place], [place] [address]    Report Status PENDING  Incomplete  Urine Culture (for  pregnant, neutropenic or urologic patients or patients with an indwelling urinary catheter)     Status: None   Collection Time: 06/10/24  4:15 AM   Specimen: Urine, Clean Catch  Result Value Ref Range Status   Specimen Description   Final    URINE, CLEAN CATCH Performed at [facility], [address] [place], [place] [address]    Special Requests   Final    NONE Performed at [facility], [address], [place], [place] [address]    Culture   Final    NO GROWTH Performed at [facility], [address] [place], [place] [address]    Report Status 06/11/2024 FINAL  Final     Labs: BNP (last 3 results) No results for input(s): BNP in the last 8760 hours. Basic Metabolic Panel: Recent Labs  Lab 06/09/24 1848 06/10/24 0348 06/10/24 0717 06/10/24 1406 06/11/24 0447  NA 141 140 136 138 139  K 4.1 4.4 4.3 4.0 3.8  CL 105 110 107 106 108  CO2 25 21* 23 24 24   GLUCOSE 104* 143* 130* 96 98  BUN 14 16 17 16 15   CREATININE 1.60* 2.31* 2.49* 2.44* 2.07*  CALCIUM  9.3 8.4* 8.3* 8.3* 8.5*  MG  --  1.9  --   --   --   PHOS  --  3.1  --   --   --    Liver Function Tests: Recent Labs  Lab 06/08/24 1622 06/09/24 1848  AST 14 19  ALT 12 11  ALKPHOS  --  61  BILITOT 0.3 <0.2  PROT 6.7 6.5  ALBUMIN  --  4.2   Recent Labs  Lab 06/09/24 1848  LIPASE 44   No results for input(s): AMMONIA in the last 168 hours. CBC: Recent Labs  Lab  06/09/24 1848 06/10/24 0348 06/10/24 0717 06/11/24 0447  WBC 16.3* 15.7* 14.5* 12.5*  NEUTROABS 12.1*  --   --   --   HGB 13.0 13.3 12.1 11.2*  HCT 39.4 41.8 38.0 34.9*  MCV 94.3 98.4 97.4 98.0  PLT 451* 430* 425* 367   Cardiac Enzymes: No results for input(s): CKTOTAL, CKMB, CKMBINDEX, TROPONINI in the last 168 hours. BNP: Invalid input(s): POCBNP CBG: No results for input(s): GLUCAP in the last 168 hours. D-Dimer No results for input(s): DDIMER in the last 72 hours. Hgb A1c Recent Labs    06/08/24 1622  HGBA1C 5.0   Lipid Profile No results for input(s): CHOL, HDL, LDLCALC, TRIG, CHOLHDL, LDLDIRECT in the last 72 hours. Thyroid  function studies No results for input(s): TSH, T4TOTAL, T3FREE, THYROIDAB in the last 72 hours.  Invalid input(s): FREET3 Anemia work up No results for input(s): VITAMINB12, FOLATE, FERRITIN, TIBC, IRON, RETICCTPCT in the last 72 hours. Urinalysis    Component Value Date/Time   COLORURINE STRAW (A) 06/10/2024 0415   APPEARANCEUR CLEAR 06/10/2024 0415   LABSPEC 1.017 06/10/2024 0415   PHURINE 6.0 06/10/2024 0415   GLUCOSEU 50 (A) 06/10/2024 0415   HGBUR NEGATIVE 06/10/2024 0415   HGBUR negative 02/03/2011 0844   BILIRUBINUR NEGATIVE 06/10/2024 0415   BILIRUBINUR negative 11/09/2022 0929   KETONESUR NEGATIVE 06/10/2024 0415   PROTEINUR 100 (A) 06/10/2024 0415   UROBILINOGEN 0.2 11/09/2022 0929   UROBILINOGEN 0.2 02/03/2011 0844   NITRITE NEGATIVE 06/10/2024 0415   LEUKOCYTESUR NEGATIVE 06/10/2024 0415   Sepsis Labs Recent Labs  Lab 06/09/24 1848 06/10/24 0348 06/10/24 0717 06/11/24 0447  WBC 16.3* 15.7* 14.5* 12.5*  Microbiology Recent Results (from the past 240 hours)  Culture, blood (Routine X 2) w Reflex to ID Panel     Status: None (Preliminary result)   Collection Time: 06/10/24 12:44 AM   Specimen: BLOOD RIGHT ARM  Result Value Ref Range Status   Specimen Description    Final    BLOOD RIGHT ARM Performed at [facility], [address] [place], [place] [address]    Special Requests   Final    BOTTLES DRAWN AEROBIC ONLY Blood Culture results may not be optimal due to an inadequate volume of blood received in culture bottles Performed at [facility], [address] [place], [place] [address]    Culture   Final    NO GROWTH 1 DAY Performed at [facility], [address] [place], [place] [address]    Report Status PENDING  Incomplete  Culture, blood (Routine X 2) w Reflex to ID Panel     Status: None (Preliminary result)   Collection Time: 06/10/24  3:48 AM   Specimen: BLOOD RIGHT ARM  Result Value Ref Range Status   Specimen Description   Final    BLOOD RIGHT ARM Performed at [facility], [address], [place], [place] [address]    Special Requests   Final    BOTTLES DRAWN AEROBIC AND ANAEROBIC Blood Culture adequate volume Performed at [facility], [address], [place], [place] [address]    Culture   Final    NO GROWTH 1 DAY Performed at [facility], [address] [place], [place] [address]    Report Status PENDING  Incomplete  Urine Culture (for pregnant, neutropenic or urologic patients or patients with an indwelling urinary catheter)     Status: None   Collection Time: 06/10/24  4:15 AM   Specimen: Urine, Clean Catch  Result Value Ref Range Status   Specimen Description   Final    URINE, CLEAN CATCH Performed at [facility], [address], [place], [place] [address]    Special Requests   Final    NONE Performed at [facility] [address] [place], [place] [address]    Culture   Final    NO GROWTH Performed at [facility], [address] [place], [place] [address]    Report Status 06/11/2024 FINAL  Final     Patient was seen and examined on the day of discharge and was found to be in stable condition.  Time coordinating discharge: 35 minutes including assessment and coordination of care, as well as examination of the patient.   SIGNED:  Daren Eck, DO [facility] 06/11/2024, 11:09 AM

## 2024-06-11 NOTE — Progress Notes (Signed)
 AVS and discharge instructions reviewed w/ patient.

## 2024-06-12 ENCOUNTER — Telehealth: Payer: Self-pay

## 2024-06-12 NOTE — Transitions of Care (Post Inpatient/ED Visit) (Signed)
 06/12/2024  Name: Ann Frederick MRN: [id number] DOB: [birth year]  Today's TOC FU Call Status: Today's TOC FU Call Status:: Successful TOC FU Call Completed TOC FU Call Complete Date: 06/12/24 Patient's Name and Date of Birth confirmed.  Transition Care Management Follow-up Telephone Call Date of Discharge: 06/11/24 Discharge Facility: Mary[facility] Sm[facility]) Type of Discharge: Inpatient Admission Primary Inpatient Discharge Diagnosis:: AKF How have you been since you were released from the hospital?: Better Any questions or concerns?: No  Items Reviewed: Medications obtained,verified, and reconciled?: Yes (Medications Reviewed) Any new allergies since your discharge?: No Dietary orders reviewed?: Yes Do you have support at home?: Yes People in Home [RPT]: spouse  Medications Reviewed Today: Medications Reviewed Today     Reviewed by Darrall Ellison, LPN (Licensed Practical Nurse) on 06/12/24 at 1105  Med List Status: <None>   Medication Order Taking? Sig Documenting Provider Last Dose Status Informant  amLODipine (NORVASC) 5 MG tablet [id number] Yes Take 1 tablet (5 mg total) by mouth daily. Daren Eck, DO  Active   aspirin  EC 81 MG tablet [id number] Yes Take 81 mg by mouth in the morning. Swallow whole. [provider]  Active Self, Pharmacy Records  buPROPion  (WELLBUTRIN  XL) 300 MG 24 hr tablet [id number] Yes Take 300 mg by mouth every morning. [provider]  Active Self, Pharmacy Records  busPIRone  (BUSPAR ) 15 MG tablet [id number] Yes TAKE 1 TABLET BY MOUTH TWICE A DAY Saguier, Edward, PA-C  Active Self, Pharmacy Records           Med Note Wilene Hang Jun 10, 2024  8:28 AM) Pt states only taking 1 tablet po daily  cephALEXin  (KEFLEX ) 500 MG capsule [id number] Yes Take 1 capsule (500 mg total) by mouth 2 (two) times daily. Daren Eck, DO  Active   Cholecalciferol (VITAMIN D3) 1000 units CAPS [id number] Yes Take 1,000 Units by mouth daily. [provider]  Active Self, Pharmacy Records  estradiol (ESTRACE) 2 MG tablet [id number] Yes Take 2 mg by mouth daily. [provider]  Active Self, Pharmacy Records           Med Note Alen Husbands, Annetta Killian Jun 10, 2024  8:31 AM) Pt states Taking 1/2 tablet po daily  ezetimibe  (ZETIA ) 10 MG tablet [id number] Yes Take 1 tablet (10 mg total) by mouth daily. Saguier, Edward, PA-C  Active Self, Pharmacy Records  losartan  (COZAAR ) 25 MG tablet [id number] Yes Take 25 mg by mouth daily. [provider]  Active Self, Pharmacy Records  LYSINE PO [id number] Yes Take 1 capsule by mouth daily. [provider]  Active Self, Pharmacy Records           Med Note Guido Leeks, Lavonia Powers   Sat Feb 25, 2024  7:21 PM) Strength not noted  metFORMIN  (GLUCOPHAGE ) 500 MG tablet [id number] Yes TAKE 1 TABLET BY MOUTH TWICE A DAY WITH FOOD  Patient taking differently: Take 500 mg by mouth daily with breakfast.   Saguier, Edward, PA-C  Active Self, Pharmacy Records           Med Note Wilene Hang Jun 10, 2024  8:28 AM) Pt states only taking 1 tab po daily  omeprazole  (PRILOSEC) 20 MG capsule [id number] Yes Take 1 capsule (20 mg total) by mouth daily.  Patient taking differently: Take 20 mg by mouth daily as needed (for heartburn).   Palumbo, April, MD  Active Self, Pharmacy Records  Med Note Wilene Hang Jun 10, 2024  8:34 AM) Has not picked up from pharmacy yet  ondansetron  (ZOFRAN -ODT) 4 MG disintegrating tablet [id number] Yes Take 1 tablet (4 mg total) by mouth every 8 (eight) hours as needed.  Patient taking differently: Take 4 mg by mouth every 8 (eight) hours as needed for nausea or vomiting (dissolve orally).   Eldon Greenland, MD  Active Self, Pharmacy Records  prochlorperazine  (COMPAZINE ) 5 MG tablet [id number] Yes Take 1-2 tablets (5-10 mg total) by mouth every 6 (six) hours as needed for nausea, vomiting or refractory nausea / vomiting. Candyce Champagne, MD  Active Self,  Pharmacy Records  Semaglutide -Weight Management (WEGOVY ) 0.25 MG/0.5ML [place] [id number] Yes Inject 0.25 mg into the skin once a week. On Sundays [provider]  Active Self, Pharmacy Records  UNABLE TO Rushie Courier [id number] Yes Med Name: KETAMINE 250MG  ODT RASPBERRY [provider]  Active Self, Pharmacy Records  valACYclovir  (VALTREX ) 1000 MG tablet [id number] Yes Take 2 tablets (2,000 mg total) by mouth 2 (two) times daily. Collins Dean, NP  Active Self, Pharmacy Records  Zinc  50 MG TABS [id number] Yes Take 50 mg by mouth daily. [provider]  Active Self, Pharmacy Records  zolpidem  (AMBIEN ) 10 MG tablet [id number] Yes Take 5-10 mg by mouth at bedtime as needed for sleep. [provider]  Active Self, Pharmacy Records            Home Care and Equipment/Supplies: Were Home Health Services Ordered?: NA Any new equipment or medical supplies ordered?: NA  Functional Questionnaire: Do you need assistance with bathing/showering or dressing?: No Do you need assistance with meal preparation?: No Do you need assistance with eating?: No Do you have difficulty maintaining continence: No Do you need assistance with getting out of bed/getting out of a chair/moving?: No Do you have difficulty managing or taking your medications?: No  Follow up appointments reviewed: PCP Follow-up appointment confirmed?: Yes Date of PCP follow-up appointment?: 06/15/24 Follow-up Provider: [facility] Follow-up appointment confirmed?: No Reason Specialist Follow-Up Not Confirmed: Patient has Specialist Provider Number and will Call for Appointment Do you need transportation to your follow-up appointment?: No Do you understand care options if your condition(s) worsen?: Yes-patient verbalized understanding    SIGNATURE Darrall Ellison, LPN [facility] Nurse Health Advisor Direct Dial [phone]

## 2024-06-15 ENCOUNTER — Ambulatory Visit: Admitting: Medical

## 2024-06-15 ENCOUNTER — Ambulatory Visit: Payer: Self-pay | Admitting: Medical

## 2024-06-15 VITALS — BP 136/84 | HR 88 | Temp 98.2°F | Resp 18 | Ht 62.0 in | Wt 166.4 lb

## 2024-06-15 DIAGNOSIS — I10 Essential (primary) hypertension: Secondary | ICD-10-CM

## 2024-06-15 DIAGNOSIS — Z87440 Personal history of urinary (tract) infections: Secondary | ICD-10-CM

## 2024-06-15 DIAGNOSIS — E785 Hyperlipidemia, unspecified: Secondary | ICD-10-CM | POA: Diagnosis not present

## 2024-06-15 DIAGNOSIS — D72829 Elevated white blood cell count, unspecified: Secondary | ICD-10-CM | POA: Diagnosis not present

## 2024-06-15 DIAGNOSIS — E669 Obesity, unspecified: Secondary | ICD-10-CM

## 2024-06-15 LAB — COMPREHENSIVE METABOLIC PANEL WITH GFR
ALT: 18 U/L (ref 0–35)
AST: 16 U/L (ref 0–37)
Albumin: 4.2 g/dL (ref 3.5–5.2)
Alkaline Phosphatase: 48 U/L (ref 39–117)
BUN: 7 mg/dL (ref 6–23)
CO2: 28 meq/L (ref 19–32)
Calcium: 9.3 mg/dL (ref 8.4–10.5)
Chloride: 102 meq/L (ref 96–112)
Creatinine, Ser: 1 mg/dL (ref 0.40–1.20)
GFR: 64.69 mL/min (ref >60.00)
Glucose, Bld: 82 mg/dL (ref 70–99)
Potassium: 4.3 meq/L (ref 3.5–5.1)
Sodium: 139 meq/L (ref 135–145)
Total Bilirubin: 0.3 mg/dL (ref 0.2–1.2)
Total Protein: 6.7 g/dL (ref 6.0–8.3)

## 2024-06-15 LAB — POC URINALSYSI DIPSTICK (AUTOMATED)
Bilirubin, UA: NEGATIVE
Blood, UA: NEGATIVE
Glucose, UA: NEGATIVE
Ketones, UA: NEGATIVE
Leukocytes, UA: NEGATIVE
Nitrite, UA: NEGATIVE
Protein, UA: NEGATIVE
Spec Grav, UA: <=1.005 — AB (ref 1.010–1.025)
Urobilinogen, UA: 0.2 U/dL
pH, UA: 5 (ref 5.0–8.0)

## 2024-06-15 LAB — CBC WITH DIFFERENTIAL/PLATELET
Basophils Absolute: 0.1 10*3/uL (ref 0.0–0.1)
Basophils Relative: 1.3 % (ref 0.0–3.0)
Eosinophils Absolute: 0.3 10*3/uL (ref 0.0–0.7)
Eosinophils Relative: 3.7 % (ref 0.0–5.0)
HCT: 35.9 % — ABNORMAL LOW (ref 36.0–46.0)
Hemoglobin: 12 g/dL (ref 12.0–15.0)
Lymphocytes Relative: 24.9 % (ref 12.0–46.0)
Lymphs Abs: 2.3 10*3/uL (ref 0.7–4.0)
MCHC: 33.4 g/dL (ref 30.0–36.0)
MCV: 92.4 fl (ref 78.0–100.0)
Monocytes Absolute: 0.6 10*3/uL (ref 0.1–1.0)
Monocytes Relative: 6.2 % (ref 3.0–12.0)
Neutro Abs: 5.9 10*3/uL (ref 1.4–7.7)
Neutrophils Relative %: 63.9 % (ref 43.0–77.0)
Platelets: 486 10*3/uL — ABNORMAL HIGH (ref 150.0–400.0)
RBC: 3.88 Mil/uL (ref 3.87–5.11)
RDW: 12.7 % (ref 11.5–15.5)
WBC: 9.2 10*3/uL (ref 4.0–10.5)

## 2024-06-15 LAB — LIPID PANEL
Cholesterol: 164 mg/dL (ref 0–200)
HDL: 56.4 mg/dL (ref >39.00)
LDL Cholesterol: 88 mg/dL (ref 0–99)
NonHDL: 107.12
Total CHOL/HDL Ratio: 3
Triglycerides: 98 mg/dL (ref 0.0–149.0)
VLDL: 19.6 mg/dL (ref 0.0–40.0)

## 2024-06-15 LAB — CULTURE, BLOOD (ROUTINE X 2)
Culture: NO GROWTH
Culture: NO GROWTH
Special Requests: ADEQUATE

## 2024-06-15 NOTE — Patient Instructions (Signed)
 Acute Kidney Injury (AKI) Recent AKI with creatinine peak at 2.49 due to dehydration, polypharmacy, and CT contrast. GFR decreased. Losartan  and metformin  held. - Repeat metabolic panel to assess kidney function. - Obtain urine culture for proteinuria and infection. - Monitor blood pressure and kidney function before losartan  reinitiation. - Ensure adequate hydration. -stop omeprazole .  Hypertension Elevated blood pressure with suboptimal control on Norvasc. Losartan  held due to AKI. Discussed potential amlodipine adjustment. - Monitor blood pressure at home and report readings on Monday. - Consider increasing amlodipine to 10 mg if hypertension persists. - Evaluate losartan  reinitiation post kidney function stabilization.  Obesity/metabolic syndrome A1c at 5.0, indicating good control. Metformin  held due to AKI. Wegovy  discussed for glycemic contr - Continue Wegovy  for glycemic control. - Ensure adequate hydration.  Potential Lymphoma CT scan showed soft tissue stranding, differential includes lymphoma. No specialist seen during hospitalization. Hematologist/oncologist appointment scheduled. - Attend hematologist/oncologist appointment for further evaluation.  Follow up date to be determined after lab review and hematologist appointment review.

## 2024-06-15 NOTE — Progress Notes (Signed)
 Subjective:    Patient ID: Ann Frederick, female    DOB: 1971/11/15, 53 y.o.   MRN: [id number]  HPI  Pt in for follow up.  Admitted From: Home Disposition: Home   Recommendations for Outpatient Follow-up:  Follow up with PCP in 1 week Repeat BMP to check creatinine in 1 week Follow-up with hematology/oncology.  Referral placed due to findings seen on CT abdomen pelvis --> Bilateral perirenal stranding/soft tissue, new from prior. Primary differential considerations include perirenal lymphoma or Erdheim-Chester disease.  Patient requested to see Dr. Timmy  Discharge Diagnoses:    Principal Problem:   AKI (acute kidney injury) (HCC) Active Problems:   HTN (hypertension)   Obesity (BMI 30-39.9) Sepsis ruled out    You were cared for by a hospitalist during your hospital stay. If you have any questions about your discharge medications or the care you received while you were in the hospital after you are discharged, you can call the unit and ask to speak with the hospitalist on call if the hospitalist that took care of you is not available. Once you are discharged, your primary care physician will handle any further medical issues. Please note that NO REFILLS for any discharge medications will be authorized once you are discharged, as it is imperative that you return to your primary care physician (or establish a relationship with a primary care physician if you do not have one) for your aftercare needs so that they can reassess your need for medications and monitor your lab values.     Increase activity slowly   Complete by: As directed    IMPRESSION: 1. Bilateral perirenal stranding/soft tissue, new from prior. Primary differential considerations include perirenal lymphoma or Erdheim-Chester disease. 2. Otherwise negative.   Ann Frederick is a 53 year old female who presents with flank pain and elevated creatinine levels.  She began feeling unwell on Friday with symptoms of  fever and blisters, for which she took Valtrex . She also took her usual medications, including metformin , before reading an email advising against it. She has experienced watery stools for the past couple of weeks, which she attributed to Wegovy  and her gallbladder removal, leading to chronic loose stools.  She describes experiencing flank pain and pain radiating up and down her spine and abdomen, prompting her to go to the hospital. Her creatinine level was initially 1.6, but after a CT scan with contrast, it increased to 2.49. She was treated with antibiotics, although urine cultures were not timely collected. She received Rosadan and reported feeling better the next morning without pain.  Her blood pressure has been difficult to control, with readings in the 140s to 150s systolic and diastolic in the 09d. She was switched from losartan  to Norvasc  (amlodipine ), but Norvasc  is not adequately controlling her blood pressure. She also experienced swelling in her ankles, a known side effect of amlodipine .  She has a history of gallbladder removal, which has resulted in chronic loose stools. She also mentions taking Motrin  for pain, which she did not initially associate with kidney issues. She expresses concern about the possibility of lymphoma and notes that no specialist was consulted during her hospital stay.  Her GFR has fluctuated significantly, dropping from over 60 to 23. Her A1c was 5.0. She reports being in a chronic state of dehydration, which she attributes to her symptoms. No vomiting or dehydration, but she reports chronic dehydration. No recent infections or significant changes in her health status.   Ann Frederick  is a 53 year old female who presents with flank pain and elevated creatinine levels.  She began feeling unwell on Friday with symptoms of fever and blisters, for which she took Valtrex . She also took her usual medications, including metformin , before reading an email advising  against it. She has experienced watery stools for the past couple of weeks, which she attributed to Wegovy  and her gallbladder removal, leading to chronic loose stools.  She describes experiencing flank pain and pain radiating up and down her spine and abdomen, prompting her to go to the hospital. Her creatinine level was initially 1.6, but after a CT scan with contrast, it increased to 2.49. She was treated with antibiotics, although urine cultures were not timely collected. She received Rocephin  and reported feeling better the next morning without pain.  Her blood pressure has been difficult to control, with readings in the 140s to 150s systolic and diastolic in the 09d. She was switched from losartan  to Norvasc  (amlodipine ), but Norvasc  is not adequately controlling her blood pressure. She also experienced faint swelling in her ankles, a known side effect of amlodipine .  She has a history of gallbladder removal, which has resulted in chronic loose stools. She also mentions taking Motrin  for pain, which she did not initially associate with kidney issues. She expresses concern about the possibility of lymphoma per ct abd report and notes that no specialist was consulted during her hospital stay.  Her GFR has fluctuated significantly, dropping from over 60 to 23. Her A1c was 5.0. She reports being in a chronic state of dehydration, which she attributes to her symptoms. No vomiting or nausea.  Review of Systems  Constitutional:  Negative for chills, fatigue and fever.  Respiratory:  Negative for cough, chest tightness and wheezing.   Cardiovascular:  Negative for chest pain and palpitations.  Gastrointestinal:  Negative for abdominal pain, blood in stool and nausea.  Genitourinary:  Negative for dysuria.  Musculoskeletal:  Negative for back pain and myalgias.  Skin:  Negative for rash.  Neurological:  Negative for dizziness, speech difficulty and light-headedness.  Hematological:  Negative for  adenopathy.  Psychiatric/Behavioral:  Negative for behavioral problems, decreased concentration and hallucinations.        Past Medical History:  Diagnosis Date   Acne    Anemia    history of   Anxiety    Arthritis    Depression    GERD (gastroesophageal reflux disease)    worse while pregnant   Headache    Migraines   Heart murmur    ? heart murmur in past   History of blood transfusion 2013   Hypertension    Metabolic syndrome      Social History   Socioeconomic History   Marital status: Married    Spouse name: Not on file   Number of children: 2   Years of education: Not on file   Highest education level: Master's degree (e.g., MA, MS, MEng, MEd, MSW, MBA)  Occupational History   Not on file  Tobacco Use   Smoking status: Never   Smokeless tobacco: Never  Vaping Use   Vaping status: Never Used  Substance and Sexual Activity   Alcohol use: Yes    Comment: 1 glass of wine per week prior to preg   Drug use: No   Sexual activity: Yes  Other Topics Concern   Not on file  Social History Narrative   Not on file   Social Drivers of Corporate investment banker  Strain: Low Risk  (01/06/2024)   Overall Financial Resource Strain (CARDIA)    Difficulty of Paying Living Expenses: Not hard at all  Food Insecurity: No Food Insecurity (06/10/2024)   Hunger Vital Sign    Worried About Running Out of Food in the Last Year: Never true    Ran Out of Food in the Last Year: Never true  Transportation Needs: No Transportation Needs (06/10/2024)   PRAPARE - Administrator, Civil Service (Medical): No    Lack of Transportation (Non-Medical): No  Physical Activity: Insufficiently Active (01/06/2024)   Exercise Vital Sign    Days of Exercise per Week: 2 days    Minutes of Exercise per Session: 30 min  Stress: Stress Concern Present (01/06/2024)   [employer] of Occupational Health - Occupational Stress Questionnaire    Feeling of Stress : To some extent   Social Connections: Patient Declined (02/25/2024)   Social Connection and Isolation Panel    Frequency of Communication with Friends and Family: Patient declined    Frequency of Social Gatherings with Friends and Family: Patient declined    Attends Religious Services: Patient declined    Active Member of Clubs or Organizations: Patient declined    Attends Banker Meetings: Patient declined    Marital Status: Patient declined  Intimate Partner Violence: Not At Risk (06/10/2024)   Humiliation, Afraid, Rape, and Kick questionnaire    Fear of Current or Ex-Partner: No    Emotionally Abused: No    Physically Abused: No    Sexually Abused: No    Past Surgical History:  Procedure Laterality Date   ANTERIOR FUSION CERVICAL SPINE  2018   CESAREAN SECTION     x 2   CESAREAN SECTION  09/29/2012   Procedure: CESAREAN SECTION;  Surgeon: Alm JAYSON Cook, MD;  Location: [facility];  Service: Obstetrics;  Laterality: N/A;  Repeat edc 10/12/12/REQUEST;Chassity,Dee,Colleen   CHOLECYSTECTOMY N/A 02/26/2024   Procedure: LAPAROSCOPIC CHOLECYSTECTOMY  INTRAOPERATIVE CHOLANGIOGRAM;  Surgeon: Sheldon Standing, MD;  Location: [facility];  Service: General;  Laterality: N/A;   COLONOSCOPY     ESOPHAGOGASTRODUODENOSCOPY  normal    7/12   JOINT REPLACEMENT Bilateral    LAPAROSCOPIC VAGINAL HYSTERECTOMY WITH SALPINGECTOMY Bilateral 02/23/2016   Procedure: LAPAROSCOPIC ASSISTED VAGINAL HYSTERECTOMY WITH bilateral SALPINGECTOMY, right oophorectomy. laporotic repair of incidental cystotomy.;  Surgeon: Alm Cook, MD;  Location: [facility];  Service: Gynecology;  Laterality: Bilateral;   MOUTH SURGERY      Family History  Problem Relation Age of Onset   Depression Mother    Hypertension Mother    Diabetes Mother    Obesity Mother    Thyroid  disease Mother    Cancer Father        ? lung CA   Kidney disease Father    Hydrocephalus Sister    Colon cancer Neg Hx    Esophageal cancer Neg Hx    Liver cancer Neg Hx     Pancreatic cancer Neg Hx    Rectal cancer Neg Hx    Stomach cancer Neg Hx     Allergies  Allergen Reactions   Azithromycin  Diarrhea and Nausea And Vomiting   Dilaudid  [Hydromorphone ] Nausea Only    Current Outpatient Medications on File Prior to Visit  Medication Sig Dispense Refill   amLODipine  (NORVASC ) 5 MG tablet Take 1 tablet (5 mg total) by mouth daily. 30 tablet 0   aspirin  EC 81 MG tablet Take 81 mg by mouth in the morning. Swallow whole.  buPROPion  (WELLBUTRIN  XL) 300 MG 24 hr tablet Take 300 mg by mouth every morning.     busPIRone  (BUSPAR ) 15 MG tablet TAKE 1 TABLET BY MOUTH TWICE A DAY 180 tablet 2   cephALEXin  (KEFLEX ) 500 MG capsule Take 1 capsule (500 mg total) by mouth 2 (two) times daily. 6 capsule 0   Cholecalciferol (VITAMIN D3) 1000 units CAPS Take 1,000 Units by mouth daily.     estradiol (ESTRACE) 2 MG tablet Take 2 mg by mouth daily.     ezetimibe  (ZETIA ) 10 MG tablet Take 1 tablet (10 mg total) by mouth daily. 90 tablet 3   [Paused] losartan  (COZAAR ) 25 MG tablet Take 25 mg by mouth daily.     LYSINE PO Take 1 capsule by mouth daily.     metFORMIN  (GLUCOPHAGE ) 500 MG tablet TAKE 1 TABLET BY MOUTH TWICE A DAY WITH FOOD (Patient taking differently: Take 500 mg by mouth daily with breakfast.) 180 tablet 0   omeprazole  (PRILOSEC) 20 MG capsule Take 1 capsule (20 mg total) by mouth daily. (Patient taking differently: Take 20 mg by mouth daily as needed (for heartburn).) 30 capsule 0   ondansetron  (ZOFRAN -ODT) 4 MG disintegrating tablet Take 1 tablet (4 mg total) by mouth every 8 (eight) hours as needed. (Patient taking differently: Take 4 mg by mouth every 8 (eight) hours as needed for nausea or vomiting (dissolve orally).) 8 tablet 0   prochlorperazine  (COMPAZINE ) 5 MG tablet Take 1-2 tablets (5-10 mg total) by mouth every 6 (six) hours as needed for nausea, vomiting or refractory nausea / vomiting. 15 tablet 2   Semaglutide -Weight Management (WEGOVY ) 0.25  MG/0.5ML SOAJ Inject 0.25 mg into the skin once a week. On Sundays     UNABLE TO FIND Med Name: KETAMINE  250MG  ODT RASPBERRY     valACYclovir  (VALTREX ) 1000 MG tablet Take 2 tablets (2,000 mg total) by mouth 2 (two) times daily. 4 tablet 0   Zinc  50 MG TABS Take 50 mg by mouth daily.     zolpidem  (AMBIEN ) 10 MG tablet Take 5-10 mg by mouth at bedtime as needed for sleep.     No current facility-administered medications on file prior to visit.    BP 136/84   Pulse 88   Temp 98.2 F (36.8 C)   Resp 18   Ht 5' 2 (1.575 m)   Wt 166 lb 6.4 oz (75.5 kg)   LMP 02/11/2016 (Exact Date)   SpO2 98%   BMI 30.43 kg/m        Objective:   Physical Exam  General Mental Status- Alert. General Appearance- Not in acute distress.   Skin General: Color- Normal Color. Moisture- Normal Moisture.  Neck Carotid Arteries- Normal color. Moisture- Normal Moisture. No carotid bruits. No JVD.  Chest and Lung Exam Auscultation: Breath Sounds:-Normal.  Cardiovascular Auscultation:Rythm- Regular. Murmurs & Other Heart Sounds:Auscultation of the heart reveals- No Murmurs.  Abdomen Inspection:-Inspeection Normal. Palpation/Percussion:Note:No mass. Palpation and Percussion of the abdomen reveal- Non Tender, Non Distended + BS, no rebound or guarding.   Neurologic Cranial Nerve exam:- CN III-XII intact(No nystagmus), symmetric smile. Strength:- 5/5 equal and symmetric strength both upper and lower extremities.       Assessment & Plan:   Patient Instructions  Acute Kidney Injury (AKI) Recent AKI with creatinine peak at 2.49 due to dehydration, polypharmacy, and CT contrast. GFR decreased. Losartan  and metformin  held. - Repeat metabolic panel to assess kidney function. - Obtain urine culture for proteinuria and infection. - Monitor  blood pressure and kidney function before losartan  reinitiation. - Ensure adequate hydration. -stop omeprazole .  Hypertension Elevated blood pressure with  suboptimal control on Norvasc . Losartan  held due to AKI. Discussed potential amlodipine  adjustment. - Monitor blood pressure at home and report readings on Monday. - Consider increasing amlodipine  to 10 mg if hypertension persists. - Evaluate losartan  reinitiation post kidney function stabilization.  Obesity/metabolic syndrome A1c at 5.0, indicating good control. Metformin  held due to AKI. Wegovy  discussed for glycemic contr - Continue Wegovy  for glycemic control. - Ensure adequate hydration.  Potential Lymphoma CT scan showed soft tissue stranding, differential includes lymphoma. No specialist seen during hospitalization. Hematologist/oncologist appointment scheduled. - Attend hematologist/oncologist appointment for further evaluation.  Follow up date to be determined after lab review and hematologist appointment review.   Kameela Leipold, PA-C

## 2024-06-16 LAB — URINE CULTURE
MICRO NUMBER:: 16606053
Result:: NO GROWTH
SPECIMEN QUALITY:: ADEQUATE

## 2024-06-18 ENCOUNTER — Encounter: Payer: Self-pay | Admitting: Medical

## 2024-06-19 MED ORDER — WEGOVY 0.25 MG/0.5ML ~~LOC~~ SOAJ: SUBCUTANEOUS | 0 refills | Status: DC

## 2024-06-19 NOTE — Addendum Note (Signed)
 Addended by: DORINA DALLAS HERO on: 06/19/2024 05:13 AM   Modules accepted: Orders

## 2024-06-20 ENCOUNTER — Other Ambulatory Visit (HOSPITAL_COMMUNITY): Payer: Self-pay

## 2024-06-20 ENCOUNTER — Inpatient Hospital Stay: Admitting: Hematology & Oncology

## 2024-06-20 ENCOUNTER — Inpatient Hospital Stay: Attending: Hematology & Oncology

## 2024-06-20 ENCOUNTER — Telehealth: Payer: Self-pay

## 2024-06-20 ENCOUNTER — Encounter: Payer: Self-pay | Admitting: Hematology & Oncology

## 2024-06-20 VITALS — BP 130/70 | HR 88 | Temp 97.6°F | Resp 20 | Ht 62.0 in | Wt 165.4 lb

## 2024-06-20 DIAGNOSIS — Z7952 Long term (current) use of systemic steroids: Secondary | ICD-10-CM

## 2024-06-20 DIAGNOSIS — D75839 Thrombocytosis, unspecified: Secondary | ICD-10-CM | POA: Diagnosis present

## 2024-06-20 DIAGNOSIS — D509 Iron deficiency anemia, unspecified: Secondary | ICD-10-CM | POA: Insufficient documentation

## 2024-06-20 DIAGNOSIS — D473 Essential (hemorrhagic) thrombocythemia: Secondary | ICD-10-CM

## 2024-06-20 LAB — SAVE SMEAR(SSMR), FOR PROVIDER SLIDE REVIEW

## 2024-06-20 LAB — CMP (CANCER CENTER ONLY)
ALT: 19 U/L (ref 0–44)
AST: 15 U/L (ref 15–41)
Albumin: 4.6 g/dL (ref 3.5–5.0)
Alkaline Phosphatase: 52 U/L (ref 38–126)
Anion gap: 10 (ref 5–15)
BUN: 11 mg/dL (ref 6–20)
CO2: 27 mmol/L (ref 22–32)
Calcium: 9.7 mg/dL (ref 8.9–10.3)
Chloride: 103 mmol/L (ref 98–111)
Creatinine: 1.13 mg/dL — ABNORMAL HIGH (ref 0.44–1.00)
GFR, Estimated: 59 mL/min — ABNORMAL LOW (ref >60)
Glucose, Bld: 121 mg/dL — ABNORMAL HIGH (ref 70–99)
Potassium: 4 mmol/L (ref 3.5–5.1)
Sodium: 140 mmol/L (ref 135–145)
Total Bilirubin: 0.4 mg/dL (ref 0.0–1.2)
Total Protein: 7.1 g/dL (ref 6.5–8.1)

## 2024-06-20 LAB — CBC WITH DIFFERENTIAL (CANCER CENTER ONLY)
Abs Immature Granulocytes: 0.03 10*3/uL (ref 0.00–0.07)
Basophils Absolute: 0.1 10*3/uL (ref 0.0–0.1)
Basophils Relative: 1 %
Eosinophils Absolute: 0.2 10*3/uL (ref 0.0–0.5)
Eosinophils Relative: 2 %
HCT: 39.3 % (ref 36.0–46.0)
Hemoglobin: 13.2 g/dL (ref 12.0–15.0)
Immature Granulocytes: 0 %
Lymphocytes Relative: 29 %
Lymphs Abs: 3.2 10*3/uL (ref 0.7–4.0)
MCH: 31.2 pg (ref 26.0–34.0)
MCHC: 33.6 g/dL (ref 30.0–36.0)
MCV: 92.9 fL (ref 80.0–100.0)
Monocytes Absolute: 0.6 10*3/uL (ref 0.1–1.0)
Monocytes Relative: 5 %
Neutro Abs: 6.8 10*3/uL (ref 1.7–7.7)
Neutrophils Relative %: 63 %
Platelet Count: 598 10*3/uL — ABNORMAL HIGH (ref 150–400)
RBC: 4.23 MIL/uL (ref 3.87–5.11)
RDW: 12.1 % (ref 11.5–15.5)
WBC Count: 11 10*3/uL — ABNORMAL HIGH (ref 4.0–10.5)
nRBC: 0 % (ref 0.0–0.2)

## 2024-06-20 LAB — VITAMIN B12: Vitamin B-12: 1187 pg/mL — ABNORMAL HIGH (ref 180–914)

## 2024-06-20 LAB — IRON AND IRON BINDING CAPACITY (CC-WL,HP ONLY)
Iron: 82 ug/dL (ref 28–170)
Saturation Ratios: 22 % (ref 10.4–31.8)
TIBC: 382 ug/dL (ref 250–450)
UIBC: 300 ug/dL (ref 148–442)

## 2024-06-20 LAB — FOLATE: Folate: 16.9 ng/mL (ref >5.9)

## 2024-06-20 LAB — FERRITIN: Ferritin: 106 ng/mL (ref 11–307)

## 2024-06-20 NOTE — Progress Notes (Signed)
 Hematology and Oncology Follow Up Visit  FRUMA AFRICA [id number] February 01, 1971 53 y.o. 06/20/2024  Past Medical History:  Diagnosis Date   Acne    Anemia    history of   Anxiety    Arthritis    Depression    GERD (gastroesophageal reflux disease)    worse while pregnant   Headache    Migraines   Heart murmur    ? 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 Schmuhl is back for follow-up.  She was recently hospitalized.  She was hospitalized because of her kidneys.  She was having problems with flank pain.  Her cultures were negative.  However, she was put on antibiotics.  However, while in the hospital, she had a CAT scan.  The CAT scan showed this bilateral perirenal stranding.  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  Not surprising, no one saw her in the hospital.  No biopsies have been considered.  She was referred to oncology to decide what to do.  She feels better.  She really has had no specific complaints.  She has had no problems with dysuria.  There is been no hematuria.  She has had no fever.  She has had no rashes.  There is been no bleeding.  She has had no leg swelling.  She has had no hot flashes or sweats.  There is been no weight loss.  She continues to work.  She is on baby aspirin  because of the thrombocytosis.  Currently, I would say performance status about ECOG 1.    Wt Readings from Last 3 Encounters:  06/20/24 165 lb 6.4 oz (75 kg)  06/15/24 166 lb 6.4 oz (75.5 kg)  06/09/24 167 lb 11.2 oz (76.1 kg)     Medications:   Current Outpatient Medications:    amLODipine  (NORVASC ) 5 MG tablet, Take 1 tablet (5 mg total) by mouth daily., Disp: 30 tablet, Rfl: 0   aspirin  EC 81 MG tablet, Take 81 mg by mouth in the  morning. Swallow whole., Disp: , Rfl:    buPROPion  (WELLBUTRIN  XL) 300 MG 24 hr tablet, Take 300 mg by mouth every morning., Disp: , Rfl:    busPIRone  (BUSPAR ) 15 MG tablet, TAKE 1 TABLET BY MOUTH TWICE A DAY, Disp: 180 tablet, Rfl: 2   Cholecalciferol (VITAMIN D3) 1000 units CAPS, Take 1,000 Units by mouth daily., Disp: , Rfl:    estradiol (ESTRACE) 2 MG tablet, Take 2 mg by mouth daily., Disp: , Rfl:    ezetimibe  (ZETIA ) 10 MG tablet, Take 1 tablet (10 mg total) by mouth daily., Disp: 90 tablet, Rfl: 3   LYSINE PO, Take 1 capsule by mouth daily., Disp: , Rfl:    ondansetron  (ZOFRAN -ODT) 4 MG disintegrating tablet, Take 1 tablet (4 mg total) by mouth every 8 (eight) hours as needed., Disp: 8 tablet, Rfl: 0   Prenatal Vit-Fe Fumarate-FA (MULTIVITAMIN-PRENATAL) 27-0.8 MG TABS tablet, Take 1 tablet by mouth daily at 12 noon., Disp: , Rfl:    prochlorperazine  (COMPAZINE ) 5 MG tablet, Take 1-2 tablets (5-10 mg total) by mouth every 6 (six) hours as needed for nausea, vomiting or refractory nausea / vomiting., Disp: 15 tablet, Rfl: 2   Semaglutide -Weight Management (WEGOVY )  0.25 MG/0.5ML SOAJ, One injection 0.25 mg weekly, Disp: 2 mL, Rfl: 0   valACYclovir  (VALTREX ) 1000 MG tablet, Take 2 tablets (2,000 mg total) by mouth 2 (two) times daily. (Patient taking differently: Take 2,000 mg by mouth 2 (two) times daily. BID prn), Disp: 4 tablet, Rfl: 0   Zinc  50 MG TABS, Take 50 mg by mouth daily., Disp: , Rfl:    zolpidem  (AMBIEN ) 10 MG tablet, Take 5-10 mg by mouth at bedtime as needed for sleep., Disp: , Rfl:    [Paused] losartan  (COZAAR ) 25 MG tablet, Take 25 mg by mouth daily. (Patient not taking: Reported on 06/20/2024), Disp: , Rfl:    UNABLE TO FIND, Med Name: KETAMINE  250MG  ODT RASPBERRY, Disp: , Rfl:   Allergies:  Allergies  Allergen Reactions   Azithromycin  Diarrhea and Nausea And Vomiting   Dilaudid  [Hydromorphone ] Nausea Only    Past Medical History, Surgical history, Social history, and  Family History were reviewed and updated.  Review of Systems: Review of Systems  Constitutional: Negative.   HENT: Negative.    Eyes: Negative.   Respiratory: Negative.    Cardiovascular: Negative.   Gastrointestinal:  Positive for abdominal pain and nausea.  Genitourinary:  Positive for dysuria and frequency.  Skin: Negative.   Neurological: Negative.   Endo/Heme/Allergies: Negative.   Psychiatric/Behavioral: Negative.       Physical Exam:  height is 5' 2 (1.575 m) and weight is 165 lb 6.4 oz (75 kg). Her oral temperature is 97.6 F (36.4 C). Her blood pressure is 130/70 and her pulse is 88. Her respiration is 20 and oxygen saturation is 99%.   Physical Exam Vitals reviewed.  HENT:     Head: Normocephalic and atraumatic.   Eyes:     Pupils: Pupils are equal, round, and reactive to light.    Cardiovascular:     Rate and Rhythm: Normal rate and regular rhythm.     Heart sounds: Normal heart sounds.  Pulmonary:     Effort: Pulmonary effort is normal.     Breath sounds: Normal breath sounds.  Abdominal:     General: Bowel sounds are normal.     Palpations: Abdomen is soft.   Musculoskeletal:        General: No tenderness or deformity. Normal range of motion.     Cervical back: Normal range of motion.  Lymphadenopathy:     Cervical: No cervical adenopathy.   Skin:    General: Skin is warm and dry.     Findings: No erythema or rash.   Neurological:     Mental Status: She is alert and oriented to person, place, and time.   Psychiatric:        Behavior: Behavior normal.        Thought Content: Thought content normal.        Judgment: Judgment normal.      Lab Results  Component Value Date   WBC 11.0 (H) 06/20/2024   HGB 13.2 06/20/2024   HCT 39.3 06/20/2024   MCV 92.9 06/20/2024   PLT 598 (H) 06/20/2024     Chemistry      Component Value Date/Time   NA 140 06/20/2024 1343   K 4.0 06/20/2024 1343   CL 103 06/20/2024 1343   CO2 27 06/20/2024 1343    BUN 11 06/20/2024 1343   CREATININE 1.13 (H) 06/20/2024 1343   CREATININE 0.98 06/08/2024 1622      Component Value Date/Time   CALCIUM  9.7 06/20/2024 1343  ALKPHOS 52 06/20/2024 1343   AST 15 06/20/2024 1343   ALT 19 06/20/2024 1343   BILITOT 0.4 06/20/2024 1343     Encounter Diagnosis  Name Primary?   Essential thrombocythemia (HCC) Yes    Assessment and Plan- Patient is a 53 y.o. female who is followed by our office for thrombocythemia.  She could certainly have essential thrombocythemia.  She has not yet had a bone marrow biopsy done.  Again, I just think that this issue on the CT scan is somewhat vague.  Again it really would be nice to have had possibly a biopsy.  However, I understand why I am this is going to be done as an outpatient.  I really think that we need to get a PET scan.  I think the PET scan would be reasonable.  If the PET scan is positive, then she is probably going  need to have an open biopsy.  I would think that Urology would have to do some kind of laparoscopic approach.  Maybe, this is all just inflammatory.  It is possible that she may have an improvement in these finding.  Again, her platelet count is a bit higher.  I will get a blood smear.  I do not see anything that looked suspicious on the blood smear.  Again we will have to see what the PET scan shows.  This things could be the key.  Will plan to get her back after she has a PET scan and then figure out what needs to be done.   Lauraine Dais PA-C 6/25/20253:01 PM

## 2024-06-20 NOTE — Telephone Encounter (Signed)
 Pharmacy Patient Advocate Encounter  Received notification from [facility] that Prior Authorization for Wegovy  has been APPROVED from 06/20/24 to 01/20/25. Unable to obtain price due to refill too soon rejection, last fill date 06/20/24 next available fill date7/16/25   PA #/Case ID/Reference #: [id number]

## 2024-06-20 NOTE — Therapy (Signed)
 OUTPATIENT PHYSICAL THERAPY SHOULDER EVALUATION   Patient Name: Ann Frederick MRN: [id number] DOB:Jul 10, 1971, 53 y.o., female Today's Date: 06/26/2024  END OF SESSION:  PT End of Session - 06/25/24 0809     Visit Number 1    Authorization Type BCBS commercial    PT Start Time 0800    PT Stop Time 0845    PT Time Calculation (min) 45 min    Activity Tolerance Patient tolerated treatment well    Behavior During Therapy WFL for tasks assessed/performed          Past Medical History:  Diagnosis Date   Acne    Anemia    history of   Anxiety    Arthritis    Depression    GERD (gastroesophageal reflux disease)    worse while pregnant   Headache    Migraines   Heart murmur    ? heart murmur in past   History of blood transfusion 2013   Hypertension    Metabolic syndrome    Past Surgical History:  Procedure Laterality Date   ANTERIOR FUSION CERVICAL SPINE  2018   CESAREAN SECTION     x 2   CESAREAN SECTION  09/29/2012   Procedure: CESAREAN SECTION;  Surgeon: Alm JAYSON Cook, MD;  Location: [facility];  Service: Obstetrics;  Laterality: N/A;  Repeat edc 10/12/12/REQUEST;Chassity,Dee,Colleen   CHOLECYSTECTOMY N/A 02/26/2024   Procedure: LAPAROSCOPIC CHOLECYSTECTOMY  INTRAOPERATIVE CHOLANGIOGRAM;  Surgeon: Sheldon Standing, MD;  Location: [facility];  Service: General;  Laterality: N/A;   COLONOSCOPY     ESOPHAGOGASTRODUODENOSCOPY  normal    7/12   JOINT REPLACEMENT Bilateral    LAPAROSCOPIC VAGINAL HYSTERECTOMY WITH SALPINGECTOMY Bilateral 02/23/2016   Procedure: LAPAROSCOPIC ASSISTED VAGINAL HYSTERECTOMY WITH bilateral SALPINGECTOMY, right oophorectomy. laporotic repair of incidental cystotomy.;  Surgeon: Alm Cook, MD;  Location: [facility];  Service: Gynecology;  Laterality: Bilateral;   MOUTH SURGERY     Patient Active Problem List   Diagnosis Date Noted   HTN (hypertension) 06/11/2024   Obesity (BMI 30-39.9) 06/11/2024   AKI (acute kidney injury) (HCC) 06/09/2024   Acute on  chronic cholecystitis 02/25/2024   Cholecystitis 02/25/2024   Left hip pain 08/27/2019   Class 1 obesity due to excess calories without serious comorbidity with body mass index (BMI) of 34.0 to 34.9 in adult 11/28/2018   Heart palpitations 11/28/2018   OSA (obstructive sleep apnea) 09/14/2018   Neck pain 04/26/2018   Wellness examination 03/08/2016   S/P laparoscopic assisted vaginal hysterectomy (LAVH) 02/23/2016   Pink eye 06/19/2015   Pain of right thumb 06/19/2015   Sinusitis, acute frontal 02/05/2015   Headache around the eyes 02/05/2015   Back pain 11/01/2014   Routine general medical examination at a health care facility 10/30/2013   Vesicular rash 09/07/2013   Sinusitis 02/02/2013   Viral pharyngitis 03/03/2012   Flank pain 08/13/2011   Inguinal adenopathy 08/13/2011   Dysphagia 04/20/2011   GERD 01/29/2011   FATIGUE 01/29/2011   SCIATICA, BILATERAL 06/18/2010   LEG PAIN, BILATERAL 06/06/2010   HIP PAIN, LEFT 04/01/2010   MASS, LOCALIZED, SUPERFICIAL 03/05/2009   ACNE VULGARIS 07/11/2007   MURMUR 07/11/2007    PCP: Dorina Dallas RIGGERS   REFERRING PROVIDER: Dorina Dallas, PA-C   REFERRING DIAG: M25.511 (ICD-10-CM) - Acute pain of right shoulderS46.811A (ICD-10-CM) - Strain of right trapezius muscle, initial encounter  THERAPY DIAG:  Neck pain - Plan: PT plan of care cert/re-cert  Abnormal posture - Plan: PT plan of care cert/re-cert  Muscle weakness (generalized) - Plan: PT plan of care cert/re-cert  Neck stiffness - Plan: PT plan of care cert/re-cert  Rationale for Evaluation and Treatment: Rehabilitation  ONSET DATE: last 1-2 months   NEXT MD VISIT:  no f/u for neck pain unless needed;  PET scan 07/04/24 per oncologist SUBJECTIVE:                                                                                                                                                                                      SUBJECTIVE STATEMENT: Patient presents to  PT for R sided cervical/upper trap pain that has increased over the last 1-2 months for no apparent reason.  She denies any trauma or MOI.  She  also reports tingling sensation into the R arm into her fingers (all fingers) intermittently.  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   Reports has 3 computer monitors that she views throughout the workday.  She does not have an elevating work station that would allow her to stand and work.    States pain usually better in the AM and is worse  as the workday goes on.   States feels better with correct positioning and cross body shoulder HADD stretching.  She does not recall her HEP from therapy here a year ago, but states that it helped when she was doing it previously.   Of note, Patient was hospitalized in 3/25 for Abd pain, cholecystitis, and cholecystectomy.   She was hospitalized again 2 weeks ago for abdominal pain.  Abdominal CT demonstrated perirenal stranding and she has f/u with oncology and is scheduled for PET scan soon to r/o CA.    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 Yes: NPRS scale: 4/10 now, 8/10 worst Pain location: R UT  Pain description: aching, intermittent pain in RUT and intermittent tingling into the RUE into the fingers (all fingers).   Aggravating factors: working and sitting thru the day at computer, [employer] Relieving factors: stretching neck into sidebending  PRECAUTIONS: h/o ACDF  RED FLAGS: None   WEIGHT BEARING RESTRICTIONS: No  FALLS:  Has patient fallen in last 6 months? 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  DIAGNOSTIC FINDINGS:  N/a  PATIENT SURVEYS:  Quick Dash: 34.1%  COGNITION: Overall cognitive status: Within functional limits for tasks assessed     SENSATION: WFL  POSTURE: Forward head rounded shoulders   UPPER EXTREMITY ROM:   Active ROM Right eval Left eval  Shoulder flexion 180 p 180  Shoulder extension 55 60  Shoulder abduction 175 p 175  Shoulder adduction Reach to opposite shoulder Reach to opposite shoulder  Shoulder internal rotation To L1 To T10  Shoulder external rotation To C7 p To T2  Elbow flexion    Elbow extension    Wrist flexion    Wrist extension    Wrist ulnar deviation    Wrist radial deviation    Wrist pronation    Wrist supination    (Blank rows = not tested)  UPPER EXTREMITY MMT:  MMT Right eval Left eval  Shoulder flexion 4   p 5  Shoulder extension 4- 4+  Shoulder abduction 4 p 4+  Shoulder adduction    Shoulder internal rotation 4 4+  Shoulder external rotation 4- 4  Middle trapezius    Lower trapezius    Elbow flexion 4 5  Elbow extension 4 4  Wrist flexion    Wrist extension    Wrist ulnar deviation    Wrist radial deviation    Wrist pronation    Wrist supination    Grip strength (lbs) 10 kg 8 kg  (Blank rows = not tested)  SHOULDER SPECIAL TESTS: Impingement tests: Hawkins/Kennedy impingement test: positive  SLAP lesions: NT Instability tests: NT Rotator cuff assessment: Infraspinatus test: negative Biceps assessment: NT  NECK SPECIAL TESTS:  Spurling's is negative bilaterally.  Compression is negative, distraction feel good, but no impact on RUE symptoms  JOINT MOBILITY TESTING:  WNL for R shoulder    PALPATION:  TTP over R UT and levator with trigger points in both                                                                                                                              TREATMENT DATE: 06/25/24    PATIENT EDUCATION:  Education details: PT eval findings, anticipated POC, progress with PT, ongoing PT POC, initial HEP, postural awareness, posture and body mechanics for typical daily postioning, mobility and household tasks, self-STM techniques to R UT and levator using massage wand, role of TPDN, TPDN rational, procedure, outcomes, potential side effects, and recommended post-treatment exercises/activity, and fee schedule for TPDN and lack of insurance coverage requiring payment at time of service  Person educated: Patient Education method: Explanation, Demonstration, Verbal cues, Tactile cues, and MedBridgeGO app access provided Education comprehension: verbal cues required, tactile cues required, and needs further education   HOME EXERCISE PROGRAM: Access Code: [email or web address] URL: [email or web address] Date: 06/25/2024 Prepared by: Garnette Montclair  Exercises -  Seated Upper Trapezius Stretch  - 1 x daily - 7 x weekly - 2 sets - 2 reps - 1 min hold - Seated Levator Scapulae Stretch  - 1 x daily - 7 x weekly - 2 sets - 1 reps - 1 min hold - Seated Cervical Extension AROM  - 3 x daily - 1 x weekly - 1 sets - 10 reps - 5 hold - Seated Thoracic Lumbar Extension with Pectoralis Stretch  - 3 x daily - 7 x weekly - 1 sets - 10 reps - 5 sec hold  ASSESSMENT:  CLINICAL IMPRESSION: Patient is a 53 y.o. female referred  for physical therapy evaluation and treatment for R neck pain and R trapezius strain by PCP.  She demonstrates RUE weakness and stiffness throughout the R neck, shoulder, and elbow with postural deficits, trigger points in the R UT and levator with pain and also tingling intermittently into the RUE.   Patient is cleared for PT and would benefit from PT to address the above deficits.   She has good rehab potential to decreased her pain and  improve her cervical ROM, shoulder/scapular strength and stability and be able to work painfree.   She has had PT here in past for this problem with good relief and is highly motivated to improve her condition.    OBJECTIVE IMPAIRMENTS: decreased knowledge of condition, decreased ROM, decreased strength, increased fascial restrictions, increased muscle spasms, impaired flexibility, impaired sensation, impaired UE functional use, improper body mechanics, postural dysfunction, and pain.   ACTIVITY LIMITATIONS: lifting, sitting, reach over head, and hygiene/grooming  PARTICIPATION LIMITATIONS: cleaning, laundry, and occupation  PERSONAL FACTORS: Profession and 1-2 comorbidities: ACDF, OA, migraines, HTN  are also affecting patient's functional outcome.   REHAB POTENTIAL: Good  CLINICAL DECISION MAKING: Evolving/moderate complexity  EVALUATION COMPLEXITY: Moderate   GOALS: Goals reviewed with patient? Yes  SHORT TERM GOALS: Target date: 07/24/24  Patient will be independent with initial HEP.  Baseline: 100% PT assistance required for correct completion  Goal status: INITIAL  2.  Patient will report 50% subjective improvement in pain as indicated by 4/10 worst pain in the neck/R UT   Baseline: 8/10 worst  Goal status: INITIAL    LONG TERM GOALS: Target date: 08/21/24   Patient will be independent with advanced/ongoing HEP to improve outcomes and carryover.  Baseline: 100% PT assistance required for initial HEP, nothing advanced yet Goal status: INITIAL  2.  Patient will report 100% improvement in R shoulder pain to improve QOL.  Baseline: 8/10 worst pain  Goal status: INITIAL  3.  Patient to improve R shoulder AROM to [facility] without pain provocation to allow for increased ease of ADLs.  Baseline: 55 degrees R shoulder ER  Goal status: INITIAL  4.  Patient will demonstrate improved functional UE strength as demonstrated by 5/5 R shoulder strength for ability to do haircare and  household and recreational activities without needing rest . Baseline: unable to complete without stopping for rest Goal status: INITIAL  5.  Patient will report 10 points improvement on QuickDash to demonstrate improved functional ability.  Baseline: 34.1 Goal status: INITIAL   PLAN:  PT FREQUENCY: 2x/week  PT DURATION: 8 weeks  PLANNED INTERVENTIONS: 97164- PT Re-evaluation, 97750- Physical Performance Testing, 97110-Therapeutic exercises, 97530- Therapeutic activity, [id number]- Neuromuscular re-education, 97535- Self Care, 02859- Manual therapy, G0283- Electrical stimulation (unattended), 97016- Vasopneumatic device, [id number]- Ultrasound, [id number]- Ionotophoresis 4mg /ml Dexamethasone , Patient/Family education, Taping, Joint mobilization, Spinal mobilization, DME instructions, Cryotherapy, Moist heat, and Biofeedback  PLAN FOR NEXT SESSION: Assess nerve tension testing and add nerve glides PRN, consider trigger point dry needling, MFR to R UT/scapular musculature, Advance HEP and therex to R shoulder strength and cervical flexibility   Amiracle Neises, PT 06/26/2024, 7:59 AM

## 2024-06-20 NOTE — Telephone Encounter (Signed)
 Pharmacy Patient Advocate Encounter   Received notification from CoverMyMeds that prior authorization for Wegovy  0.25 is required/requested.   Insurance verification completed.   The patient is insured through [facility] .   Per test claim: PA required; PA submitted to above mentioned insurance via CoverMyMeds Key/confirmation #/EOC [id number] Status is pending

## 2024-06-25 ENCOUNTER — Ambulatory Visit: Attending: Medical | Admitting: Rehabilitation

## 2024-06-25 ENCOUNTER — Other Ambulatory Visit: Payer: Self-pay

## 2024-06-25 DIAGNOSIS — M436 Torticollis: Secondary | ICD-10-CM | POA: Diagnosis not present

## 2024-06-25 DIAGNOSIS — M542 Cervicalgia: Secondary | ICD-10-CM | POA: Diagnosis not present

## 2024-06-25 DIAGNOSIS — S46811A Strain of other muscles, fascia and tendons at shoulder and upper arm level, right arm, initial encounter: Secondary | ICD-10-CM | POA: Insufficient documentation

## 2024-06-25 DIAGNOSIS — M6281 Muscle weakness (generalized): Secondary | ICD-10-CM | POA: Insufficient documentation

## 2024-06-25 DIAGNOSIS — M25511 Pain in right shoulder: Secondary | ICD-10-CM | POA: Insufficient documentation

## 2024-06-25 DIAGNOSIS — R293 Abnormal posture: Secondary | ICD-10-CM | POA: Insufficient documentation

## 2024-06-27 ENCOUNTER — Ambulatory Visit: Admitting: Medical

## 2024-06-27 VITALS — BP 126/76 | HR 93 | Resp 18 | Ht 62.0 in | Wt 168.0 lb

## 2024-06-27 DIAGNOSIS — S46811A Strain of other muscles, fascia and tendons at shoulder and upper arm level, right arm, initial encounter: Secondary | ICD-10-CM

## 2024-06-27 DIAGNOSIS — R944 Abnormal results of kidney function studies: Secondary | ICD-10-CM

## 2024-06-27 DIAGNOSIS — I10 Essential (primary) hypertension: Secondary | ICD-10-CM

## 2024-06-27 DIAGNOSIS — M5412 Radiculopathy, cervical region: Secondary | ICD-10-CM | POA: Diagnosis not present

## 2024-06-27 LAB — COMPLETE METABOLIC PANEL WITHOUT GFR
AG Ratio: 1.7 (calc) (ref 1.0–2.5)
ALT: 19 U/L (ref 6–29)
AST: 17 U/L (ref 10–35)
Albumin: 4.3 g/dL (ref 3.6–5.1)
Alkaline phosphatase (APISO): 56 U/L (ref 37–153)
BUN: 9 mg/dL (ref 7–25)
CO2: 28 mmol/L (ref 20–32)
Calcium: 10.1 mg/dL (ref 8.6–10.4)
Chloride: 105 mmol/L (ref 98–110)
Creat: 0.94 mg/dL (ref 0.50–1.03)
Globulin: 2.6 g/dL (ref 1.9–3.7)
Glucose, Bld: 86 mg/dL (ref 65–99)
Potassium: 4.9 mmol/L (ref 3.5–5.3)
Sodium: 140 mmol/L (ref 135–146)
Total Bilirubin: 0.4 mg/dL (ref 0.2–1.2)
Total Protein: 6.9 g/dL (ref 6.1–8.1)

## 2024-06-27 MED ORDER — TIZANIDINE HCL 4 MG PO TABS
ORAL_TABLET | ORAL | 0 refills | Status: DC
Start: 2024-06-27 — End: 2024-06-28

## 2024-06-27 MED ORDER — TRAMADOL HCL 50 MG PO TABS: ORAL_TABLET | ORAL | 0 refills | Status: DC

## 2024-06-27 NOTE — Patient Instructions (Signed)
 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 PET scan scheduled for further evaluation. Blood smear shows high platelets, no malignancy. - Perform PET scan on July 04, 2024. - Consider biopsy based on PET scan results.  Cervical  Radiculopathy Recurrent symptoms post neck surgery. MRI shows spondylosis and disc bulge. Symptoms worsening. Zanaflex  prescribed for spasms, tramadol  for pain. - Prescribe Zanaflex  10 tablets, 1 tablet QHS as needed for muscle spasms. - Prescribe Tramadol  50 mg, 10 tablets, twice a day as needed for pain. - Continue physical therapy with dry needling scheduled for next Thursday. - Continue physical therapy. - Proceed with dry needling therapy.  Acute Kidney Injury Recent AKI episode with creatinine 1.13 mg/dL, GFR 59 fO/fpw/8.26 m.  - Repeat metabolic panel to assess current kidney function.  Hypertension Blood pressure controlled on amlodipine . Monitoring for potential switch to losartan  based on kidney function and BP trends. Amlodipine  may cause pedal edema. - Monitor blood pressure daily at home. - Repeat metabolic panel to assess kidney function. - Consider restarting losartan  if kidney function is stable and blood pressure trends upward.  Follow up date to be determined after lab review and bp update by my chart.

## 2024-06-27 NOTE — Progress Notes (Signed)
 Subjective:    Patient ID: Ann Frederick, female    DOB: [birth year], 53 y.o.   MRN: [id number]  HPI  Pt in for follow up. Ann Frederick is a 53 year old female who presents for follow-up of abnormal CT findings and blood pressure management.  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  Pt saw hematologist/oncologist and a PET scan is scheduled for further evaluation. A blood smear showed high platelets without suspicious or malignant findings.  She recently completed a course of outpatient oral antibiotics following a hospitalization. A urine culture was negative. She is monitoring her blood pressure, which was recorded at 126/76 mmHg during the visit. She is currently on amlodipine  5 mg daily. Her recent metabolic panel showed a creatinine level of 1.13 mg/dL and a GFR of 59 fO/fpw/8.26f.  She is experiencing rt side neck pain,  shoulder stiffness and tingling down her rt  arm, present for the last 6 weeks and worsening over the past two to three weeks. She has a history of neck surgery approximately five years ago. An MRI from last year showed cervical spondylosis and mild disc bulge. She is undergoing physical therapy. She experiences tingling and occasional pain radiating down her arm, more noticeable in the last six weeks.  She is considering medication for muscle spasms and nerve pain. She has been prescribed Zanaflex  for muscle spasms. She has previously used tramadol  but does not recall its effects. On review tramadol  is not on allergy or side effect list.     Review of Systems See hpi    Objective:   Physical Exam  General Mental Status- Alert. General Appearance- Not in acute distress.   Skin General: Color- Normal Color. Moisture- Normal Moisture.  Neck No JVD. Rt side neck/trapezius tender  Chest and Lung Exam Auscultation: Breath Sounds:-Normal.  Cardiovascular Auscultation:Rythm-  Regular. Murmurs & Other Heart Sounds:Auscultation of the heart reveals- No Murmurs.  Abdomen Inspection:-Inspeection Normal. Palpation/Percussion:Note:No mass. Palpation and Percussion of the abdomen reveal- Non Tender, Non Distended + BS, no rebound or guarding.    Neurologic Cranial Nerve exam:- CN III-XII intact(No nystagmus), symmetric smile. Strength:- 5/5 equal and symmetric strength both upper and lower extremities. No appreciable weakness on grip strength comparison today.      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 PET scan scheduled for further evaluation. Blood smear shows high platelets, no malignancy. - Perform PET scan on July 04, 2024. - Consider biopsy based on PET scan results.  Cervical  Radiculopathy Recurrent symptoms post neck surgery. MRI shows spondylosis and disc bulge. Symptoms worsening. Zanaflex  prescribed for spasms, tramadol  for pain. - Prescribe Zanaflex  10 tablets, 1 tablet QHS as needed for muscle spasms. - Prescribe Tramadol  50 mg, 10 tablets, twice a day as needed for pain. - Continue physical therapy with dry needling scheduled for next Thursday. - Continue physical therapy. - Proceed with dry needling therapy.  Acute Kidney Injury Recent AKI episode with creatinine 1.13 mg/dL, GFR 59 fO/fpw/8.26 m.  - Repeat metabolic panel to assess current kidney function.  Hypertension Blood pressure controlled on amlodipine . Monitoring for potential switch to losartan  based on kidney function and BP trends. Amlodipine  may cause pedal edema. - Monitor blood pressure daily at home. - Repeat metabolic panel to assess kidney function. - Consider restarting losartan  if kidney function is stable and blood pressure trends upward.  Follow up date to be determined after lab review and bp update by my chart.   Makynzie Dobesh, PA-C

## 2024-06-28 ENCOUNTER — Ambulatory Visit: Payer: Self-pay

## 2024-06-28 ENCOUNTER — Ambulatory Visit: Payer: Self-pay | Admitting: Medical

## 2024-06-28 ENCOUNTER — Telehealth: Admitting: Physician Assistant

## 2024-06-28 DIAGNOSIS — M5412 Radiculopathy, cervical region: Secondary | ICD-10-CM

## 2024-06-28 MED ORDER — GABAPENTIN 300 MG PO CAPS: ORAL_CAPSULE | ORAL | 0 refills | Status: DC

## 2024-06-28 NOTE — Patient Instructions (Signed)
 Ann Frederick, thank you for joining [name], PA-C for today's virtual visit.  While this provider is not your primary care provider (PCP), if your PCP is located in our provider database this encounter information will be shared with them immediately following your visit.   A [facility] MyChart account gives you access to today's visit and all your visits, tests, and labs performed at [facility]  click here if you don't have a [facility] [place] MyChart account or go to [email or web address]  Consent: (Patient) Ann Frederick provided verbal consent for this virtual visit at the beginning of the encounter.  Current Medications:  Current Outpatient Medications:    amLODipine  (NORVASC ) 5 MG tablet, Take 1 tablet (5 mg total) by mouth daily., Disp: 30 tablet, Rfl: 0   aspirin  EC 81 MG tablet, Take 81 mg by mouth in the morning. Swallow whole., Disp: , Rfl:    buPROPion  (WELLBUTRIN  XL) 300 MG 24 hr tablet, Take 300 mg by mouth every morning., Disp: , Rfl:    busPIRone  (BUSPAR ) 15 MG tablet, TAKE 1 TABLET BY MOUTH TWICE A DAY, Disp: 180 tablet, Rfl: 2   Cholecalciferol (VITAMIN D3) 1000 units CAPS, Take 1,000 Units by mouth daily., Disp: , Rfl:    estradiol (ESTRACE) 2 MG tablet, Take 2 mg by mouth daily., Disp: , Rfl:    ezetimibe  (ZETIA ) 10 MG tablet, Take 1 tablet (10 mg total) by mouth daily., Disp: 90 tablet, Rfl: 3   [Paused] losartan  (COZAAR ) 25 MG tablet, Take 25 mg by mouth daily. (Patient not taking: Reported on 06/20/2024), Disp: , Rfl:    LYSINE PO, Take 1 capsule by mouth daily., Disp: , Rfl:    ondansetron  (ZOFRAN -ODT) 4 MG disintegrating tablet, Take 1 tablet (4 mg total) by mouth every 8 (eight) hours as needed., Disp: 8 tablet, Rfl: 0   Prenatal Vit-Fe Fumarate-FA (MULTIVITAMIN-PRENATAL) 27-0.8 MG TABS tablet, Take 1 tablet by mouth daily at 12 noon., Disp: , Rfl:    prochlorperazine  (COMPAZINE ) 5 MG tablet, Take 1-2 tablets (5-10 mg total) by mouth every 6  (six) hours as needed for nausea, vomiting or refractory nausea / vomiting., Disp: 15 tablet, Rfl: 2   Semaglutide -Weight Management (WEGOVY ) 0.25 MG/0.5ML SOAJ, One injection 0.25 mg weekly, Disp: 2 mL, Rfl: 0   tiZANidine  (ZANAFLEX ) 4 MG tablet, 1 tab po q hs as needed muscle spasms, Disp: 10 tablet, Rfl: 0   traMADol  (ULTRAM ) 50 MG tablet, 1 tab po bid prn pain, Disp: 10 tablet, Rfl: 0   UNABLE TO FIND, Med Name: KETAMINE  250MG  ODT RASPBERRY, Disp: , Rfl:    valACYclovir  (VALTREX ) 1000 MG tablet, Take 2 tablets (2,000 mg total) by mouth 2 (two) times daily. (Patient taking differently: Take 2,000 mg by mouth 2 (two) times daily. BID prn), Disp: 4 tablet, Rfl: 0   Zinc  50 MG TABS, Take 50 mg by mouth daily., Disp: , Rfl:    zolpidem  (AMBIEN ) 10 MG tablet, Take 5-10 mg by mouth at bedtime as needed for sleep., Disp: , Rfl:    Medications ordered in this encounter:  No orders of the defined types were placed in this encounter.    *If you need refills on other medications prior to your next appointment, please contact your pharmacy*  Follow-Up: Call back or seek an in-person evaluation if the symptoms worsen or if the condition fails to improve as anticipated.  [facility] [phone]  Other Instructions Stop the Zanaflex  and do not  start the tramadol . Start the gabapentin , taking as directed. Okay to continue over-the-counter Tylenol . Continue physical therapy and dry needling. Follow-up with your neurosurgeon as scheduled this coming week. If you note any non-resolving, new, or worsening symptoms despite treatment, please seek an in-person evaluation ASAP.    If you have been instructed to have an in-person evaluation today at a local Urgent Care facility, please use the link below. It will take you to a list of all of our available [facility], including address, phone number and hours of operation. Please do not delay care.  [facility]  If you or a family member do not have a primary care provider, use the link below to schedule a visit and establish care. When you choose a [facility] primary care physician or advanced practice provider, you gain a long-term partner in health. Find a Primary Care Provider  Learn more about [facility]'s in-office and virtual care options:  - Get Care Now

## 2024-06-28 NOTE — Telephone Encounter (Signed)
 FYI Only or Action Required?: Action required by provider: clinical question for provider and update on patient condition.  Patient was last seen in primary care on 06/27/2024 by Dorina Loving, PA-C. Called Nurse Triage reporting Medication Reaction. Symptoms began yesterday. Interventions attempted: Nothing. Symptoms are: unchanged.  Triage Disposition: Call PCP When Office is Open  Patient/caregiver understands and will follow disposition?: Yes, will follow disposition  Copied from CRM [id number]. Topic: Clinical - Red Word Triage >> Jun 28, 2024  9:16 AM Adelita E wrote: [facility] that prompted transfer to Nurse Triage: Medication reaction. Patient was started on tiZANidine  (ZANAFLEX ) 4 MG tablet yesterday and noticed her lip and right foot started tingling. Patient also could not sleep because of it. Reason for Disposition  [1] Caller has NON-URGENT medicine question about med that PCP prescribed AND [2] triager unable to answer question  Answer Assessment - Initial Assessment Questions 1. NAME of MEDICINE: What medicine(s) are you calling about?     zanaflex  2. QUESTION: What is your question? (e.g., double dose of medicine, side effect)     Pt states that she took the med for the first time last night and she had developed lip tingling and foot tingling. Pt states that she has stopped taken the medication. Pt states she would like to be put on gabapentin  instead.  3. PRESCRIBER: Who prescribed the medicine? Reason: if prescribed by specialist, call should be referred to that group.     PCP 4. SYMPTOMS: Do you have any symptoms? If Yes, ask: What symptoms are you having?  How bad are the symptoms (e.g., mild, moderate, severe)     Currently has tingling in her foot, ringing in her ears  Pt states that she would like to be placed on gabapentin  instead. Denies SOB, denies swollen tongue.  Protocols used: Medication Question Call-A-AH

## 2024-06-28 NOTE — Progress Notes (Signed)
 Virtual Visit Consent   Ann Frederick, you are scheduled for a virtual visit with a [facility] provider today. Just as with appointments in the office, your consent must be obtained to participate. Your consent will be active for this visit and any virtual visit you may have with one of our providers in the next 365 days. If you have a MyChart account, a copy of this consent can be sent to you electronically.  As this is a virtual visit, video technology does not allow for your provider to perform a traditional examination. This may limit your provider's ability to fully assess your condition. If your provider identifies any concerns that need to be evaluated in person or the need to arrange testing (such as labs, EKG, etc.), we will make arrangements to do so. Although advances in technology are sophisticated, we cannot ensure that it will always work on either your end or our end. If the connection with a video visit is poor, the visit may have to be switched to a telephone visit. With either a video or telephone visit, we are not always able to ensure that we have a secure connection.  By engaging in this virtual visit, you consent to the provision of healthcare and authorize for your insurance to be billed (if applicable) for the services provided during this visit. Depending on your insurance coverage, you may receive a charge related to this service.  I need to obtain your verbal consent now. Are you willing to proceed with your visit today? Ann Frederick has provided verbal consent on 06/28/2024 for a virtual visit (video or telephone). Ann Frederick, [place]  Date: 06/28/2024 7:38 PM   Virtual Visit via Video Note   I, Ann Frederick, connected with  Ann Frederick  ([id number], 11-Mar-1971) on 06/28/24 at  7:30 PM EDT by a video-enabled telemedicine application and verified that I am speaking with the correct person using two identifiers.  Location: Patient: Virtual Visit Location  Patient: Home Provider: Virtual Visit Location Provider: Home Office   I discussed the limitations of evaluation and management by telemedicine and the availability of in person appointments. The patient expressed understanding and agreed to proceed.    History of Present Illness: Ann Frederick is a 53 y.o. who identifies as a female who was assigned female at birth, and is being seen today for follow up regarding chronic and worsening cervical neck pain.  Patient with history of cervicalgia and cervical radiculopathy, with prior MRIs of cervical spine revealing nerve impingement and bulging disc at multiple levels of cervical and lumbar spine.  Patient was evaluated by her primary care provider yesterday who sent in a prescription for Zanaflex  and tramadol  to help with pain until her follow-up with neurosurgery.  Patient states she did not pick up the tramadol  as this is not a medication she wants to take.  Did take Zanaflex  last night but noticed it made her feel very off and caused some tingling in her lips and an unwell feeling.  As such has stopped the medication and does not want to take any further medicine.  Patient does note in the past she has been on gabapentin  and tolerated it well.  Is wondering if that is a potential option to help with her pain until she can see her neurosurgeon.  Pain is currently a 4-5 out of 10 on the pain scale.  And other times it is worse.    HPI: HPI  Problems:  Patient Active Problem List   Diagnosis Date Noted   HTN (hypertension) 06/11/2024   Obesity (BMI 30-39.9) 06/11/2024   AKI (acute kidney injury) (HCC) 06/09/2024   Acute on chronic cholecystitis 02/25/2024   Cholecystitis 02/25/2024   Left hip pain 08/27/2019   Class 1 obesity due to excess calories without serious comorbidity with body mass index (BMI) of 34.0 to 34.9 in adult 11/28/2018   Heart palpitations 11/28/2018   OSA (obstructive sleep apnea) 09/14/2018   Neck pain 04/26/2018    Wellness examination 03/08/2016   S/P laparoscopic assisted vaginal hysterectomy (LAVH) 02/23/2016   Pink eye 06/19/2015   Pain of right thumb 06/19/2015   Sinusitis, acute frontal 02/05/2015   Headache around the eyes 02/05/2015   Back pain 11/01/2014   Routine general medical examination at a health care facility 10/30/2013   Vesicular rash 09/07/2013   Sinusitis 02/02/2013   Viral pharyngitis 03/03/2012   Flank pain 08/13/2011   Inguinal adenopathy 08/13/2011   Dysphagia 04/20/2011   GERD 01/29/2011   FATIGUE 01/29/2011   SCIATICA, BILATERAL 06/18/2010   LEG PAIN, BILATERAL 06/06/2010   HIP PAIN, LEFT 04/01/2010   MASS, LOCALIZED, SUPERFICIAL 03/05/2009   ACNE VULGARIS 07/11/2007   MURMUR 07/11/2007    Allergies:  Allergies  Allergen Reactions   Azithromycin  Diarrhea and Nausea And Vomiting   Dilaudid  [Hydromorphone ] Nausea Only   Medications:  Current Outpatient Medications:    gabapentin  (NEURONTIN ) 300 MG capsule, Take 1 capsule PO daily for 3 days, before increasing to 1 capsule twice daily., Disp: 60 capsule, Rfl: 0   amLODipine  (NORVASC ) 5 MG tablet, Take 1 tablet (5 mg total) by mouth daily., Disp: 30 tablet, Rfl: 0   aspirin  EC 81 MG tablet, Take 81 mg by mouth in the morning. Swallow whole., Disp: , Rfl:    buPROPion  (WELLBUTRIN  XL) 300 MG 24 hr tablet, Take 300 mg by mouth every morning., Disp: , Rfl:    busPIRone  (BUSPAR ) 15 MG tablet, TAKE 1 TABLET BY MOUTH TWICE A DAY, Disp: 180 tablet, Rfl: 2   Cholecalciferol (VITAMIN D3) 1000 units CAPS, Take 1,000 Units by mouth daily., Disp: , Rfl:    estradiol (ESTRACE) 2 MG tablet, Take 2 mg by mouth daily., Disp: , Rfl:    ezetimibe  (ZETIA ) 10 MG tablet, Take 1 tablet (10 mg total) by mouth daily., Disp: 90 tablet, Rfl: 3   [Paused] losartan  (COZAAR ) 25 MG tablet, Take 25 mg by mouth daily. (Patient not taking: Reported on 06/20/2024), Disp: , Rfl:    LYSINE PO, Take 1 capsule by mouth daily., Disp: , Rfl:     ondansetron  (ZOFRAN -ODT) 4 MG disintegrating tablet, Take 1 tablet (4 mg total) by mouth every 8 (eight) hours as needed., Disp: 8 tablet, Rfl: 0   Prenatal Vit-Fe Fumarate-FA (MULTIVITAMIN-PRENATAL) 27-0.8 MG TABS tablet, Take 1 tablet by mouth daily at 12 noon., Disp: , Rfl:    prochlorperazine  (COMPAZINE ) 5 MG tablet, Take 1-2 tablets (5-10 mg total) by mouth every 6 (six) hours as needed for nausea, vomiting or refractory nausea / vomiting., Disp: 15 tablet, Rfl: 2   Semaglutide -Weight Management (WEGOVY ) 0.25 MG/0.5ML SOAJ, One injection 0.25 mg weekly, Disp: 2 mL, Rfl: 0   UNABLE TO FIND, Med Name: KETAMINE  250MG  ODT RASPBERRY, Disp: , Rfl:    valACYclovir  (VALTREX ) 1000 MG tablet, Take 2 tablets (2,000 mg total) by mouth 2 (two) times daily. (Patient taking differently: Take 2,000 mg by mouth 2 (two) times daily. BID prn), Disp: 4 tablet,  Rfl: 0   Zinc  50 MG TABS, Take 50 mg by mouth daily., Disp: , Rfl:    zolpidem  (AMBIEN ) 10 MG tablet, Take 5-10 mg by mouth at bedtime as needed for sleep., Disp: , Rfl:   Observations/Objective: Patient is well-developed, well-nourished in no acute distress.  Resting comfortably  at home.  Head is normocephalic, atraumatic.  No labored breathing.  Speech is clear and coherent with logical content.  Patient is alert and oriented at baseline.   Assessment and Plan: 1. Cervical radiculopathy (Primary) - gabapentin  (NEURONTIN ) 300 MG capsule; Take 1 capsule PO daily for 3 days, before increasing to 1 capsule twice daily.  Dispense: 60 capsule; Refill: 0  Acute on chronic issue.  Intolerance to Zanaflex .  Patient has stopped this and encouraged her to continue cessation of that medication.  She is not taking tramadol  and does not want to start.  Since she has been on gabapentin  before and tolerated well, will restart this at 300 mg once daily for 3 days before increasing to twice daily dosing.  Will need follow-up with primary care provider or neurosurgery  for further adjustment in medication and ongoing management.  She has a neurosurgery appointment scheduled for next week.  Strict urgent care/ER precautions reviewed with patient.  Follow Up Instructions: I discussed the assessment and treatment plan with the patient. The patient was provided an opportunity to ask questions and all were answered. The patient agreed with the plan and demonstrated an understanding of the instructions.  A copy of instructions were sent to the patient via MyChart unless otherwise noted below.   The patient was advised to call back or seek an in-person evaluation if the symptoms worsen or if the condition fails to improve as anticipated.    Ann Velma Lunger, PA-C

## 2024-07-03 ENCOUNTER — Ambulatory Visit: Attending: Medical

## 2024-07-03 ENCOUNTER — Other Ambulatory Visit: Payer: Self-pay

## 2024-07-03 DIAGNOSIS — M542 Cervicalgia: Secondary | ICD-10-CM | POA: Insufficient documentation

## 2024-07-03 DIAGNOSIS — M436 Torticollis: Secondary | ICD-10-CM | POA: Diagnosis present

## 2024-07-03 DIAGNOSIS — M6281 Muscle weakness (generalized): Secondary | ICD-10-CM | POA: Insufficient documentation

## 2024-07-03 DIAGNOSIS — M25611 Stiffness of right shoulder, not elsewhere classified: Secondary | ICD-10-CM | POA: Diagnosis present

## 2024-07-03 DIAGNOSIS — R293 Abnormal posture: Secondary | ICD-10-CM | POA: Insufficient documentation

## 2024-07-03 NOTE — Therapy (Signed)
 OUTPATIENT PHYSICAL THERAPY SHOULDER TREATMENT   Patient Name: Ann Frederick MRN: [id number] DOB:28-Feb-1971, 53 y.o., female Today's Date: 07/03/2024  END OF SESSION:    Past Medical History:  Diagnosis Date   Acne    Anemia    history of   Anxiety    Arthritis    Depression    GERD (gastroesophageal reflux disease)    worse while pregnant   Headache    Migraines   Heart murmur    ? heart murmur in past   History of blood transfusion 2013   Hypertension    Metabolic syndrome    Past Surgical History:  Procedure Laterality Date   ANTERIOR FUSION CERVICAL SPINE  2018   CESAREAN SECTION     x 2   CESAREAN SECTION  09/29/2012   Procedure: CESAREAN SECTION;  Surgeon: Alm JAYSON Cook, MD;  Location: [facility];  Service: Obstetrics;  Laterality: N/A;  Repeat edc 10/12/12/REQUEST;Chassity,Dee,Colleen   CHOLECYSTECTOMY N/A 02/26/2024   Procedure: LAPAROSCOPIC CHOLECYSTECTOMY  INTRAOPERATIVE CHOLANGIOGRAM;  Surgeon: Sheldon Standing, MD;  Location: [facility];  Service: General;  Laterality: N/A;   COLONOSCOPY     ESOPHAGOGASTRODUODENOSCOPY  normal    7/12   JOINT REPLACEMENT Bilateral    LAPAROSCOPIC VAGINAL HYSTERECTOMY WITH SALPINGECTOMY Bilateral 02/23/2016   Procedure: LAPAROSCOPIC ASSISTED VAGINAL HYSTERECTOMY WITH bilateral SALPINGECTOMY, right oophorectomy. laporotic repair of incidental cystotomy.;  Surgeon: Alm Cook, MD;  Location: [facility];  Service: Gynecology;  Laterality: Bilateral;   MOUTH SURGERY     Patient Active Problem List   Diagnosis Date Noted   HTN (hypertension) 06/11/2024   Obesity (BMI 30-39.9) 06/11/2024   AKI (acute kidney injury) (HCC) 06/09/2024   Acute on chronic cholecystitis 02/25/2024   Cholecystitis 02/25/2024   Left hip pain 08/27/2019   Class 1 obesity due to excess calories without serious comorbidity with body mass index (BMI) of 34.0 to 34.9 in adult 11/28/2018   Heart palpitations 11/28/2018   OSA (obstructive sleep apnea) 09/14/2018   Neck  pain 04/26/2018   Wellness examination 03/08/2016   S/P laparoscopic assisted vaginal hysterectomy (LAVH) 02/23/2016   Pink eye 06/19/2015   Pain of right thumb 06/19/2015   Sinusitis, acute frontal 02/05/2015   Headache around the eyes 02/05/2015   Back pain 11/01/2014   Routine general medical examination at a health care facility 10/30/2013   Vesicular rash 09/07/2013   Sinusitis 02/02/2013   Viral pharyngitis 03/03/2012   Flank pain 08/13/2011   Inguinal adenopathy 08/13/2011   Dysphagia 04/20/2011   GERD 01/29/2011   FATIGUE 01/29/2011   SCIATICA, BILATERAL 06/18/2010   LEG PAIN, BILATERAL 06/06/2010   HIP PAIN, LEFT 04/01/2010   MASS, LOCALIZED, SUPERFICIAL 03/05/2009   ACNE VULGARIS 07/11/2007   MURMUR 07/11/2007    PCP: Dorina Dallas RIGGERS   REFERRING PROVIDER: Dorina Dallas, PA-C   REFERRING DIAG: M25.511 (ICD-10-CM) - Acute pain of right shoulderS46.811A (ICD-10-CM) - Strain of right trapezius muscle, initial encounter  THERAPY DIAG:  No diagnosis found.  Rationale for Evaluation and Treatment: Rehabilitation  ONSET DATE: last 1-2 months   NEXT MD VISIT:  no f/u for neck pain unless needed;  PET scan 07/04/24 per oncologist SUBJECTIVE:  SUBJECTIVE STATEMENT: 07/03/24; consistent numbness R lips, R lower leg, R arm Patient presents to PT for R sided cervical/upper trap pain that has increased over the last 1-2 months for no apparent reason.  She denies any trauma or MOI.  She  also reports tingling sensation into the R arm into her fingers (all fingers) intermittently.  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   Reports has 3 computer monitors that she views throughout the workday.  She does not have an elevating work station that would allow  her to stand and work.    States pain usually better in the AM and is worse  as the workday goes on.   States feels better with correct positioning and cross body shoulder HADD stretching.  She does not recall her HEP from therapy here a year ago, but states that it helped when she was doing it previously.   Of note, Patient was hospitalized in 3/25 for Abd pain, cholecystitis, and cholecystectomy.   She was hospitalized again 2 weeks ago for abdominal pain.  Abdominal CT demonstrated perirenal stranding and she has f/u with oncology and is scheduled for PET scan soon to r/o CA.    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 Yes: NPRS scale: 4/10 now, 8/10 worst Pain location: R UT  Pain description: aching, intermittent pain in RUT and intermittent tingling into the RUE into the fingers (all fingers).   Aggravating factors: working and sitting thru the day at computer, [employer] Relieving factors: stretching neck into sidebending  PRECAUTIONS: h/o ACDF  RED FLAGS: None   WEIGHT BEARING RESTRICTIONS: No  FALLS:  Has patient fallen in last 6 months? 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  DIAGNOSTIC FINDINGS:  N/a  PATIENT SURVEYS:  Quick Dash: 34.1%  COGNITION: Overall cognitive status: Within functional limits for tasks assessed     SENSATION: WFL  POSTURE: Forward head rounded shoulders   UPPER EXTREMITY ROM:    Active ROM Right eval Left eval  Shoulder flexion 180 p 180  Shoulder extension 55 60  Shoulder abduction 175 p 175  Shoulder adduction Reach to opposite shoulder Reach to opposite shoulder  Shoulder internal rotation To L1 To T10  Shoulder external rotation To C7 p To T2  Elbow flexion    Elbow extension    Wrist flexion    Wrist extension    Wrist ulnar deviation    Wrist radial deviation    Wrist pronation    Wrist supination    (Blank rows = not tested)  UPPER EXTREMITY MMT:  MMT Right eval Left eval  Shoulder flexion 4   p 5  Shoulder extension 4- 4+  Shoulder abduction 4 p 4+  Shoulder adduction    Shoulder internal rotation 4 4+  Shoulder external rotation 4- 4  Middle trapezius    Lower trapezius    Elbow flexion  4 5  Elbow extension 4 4  Wrist flexion    Wrist extension    Wrist ulnar deviation    Wrist radial deviation    Wrist pronation    Wrist supination    Grip strength (lbs) 10 kg 8 kg  (Blank rows = not tested)  SHOULDER SPECIAL TESTS: Impingement tests: Hawkins/Kennedy impingement test: positive  SLAP lesions: NT Instability tests: NT Rotator cuff assessment: Infraspinatus test: negative Biceps assessment: NT  NECK SPECIAL TESTS:  Spurling's is negative bilaterally.  Compression is negative, distraction feel good, but no impact on RUE symptoms  JOINT MOBILITY TESTING:  WNL for R shoulder    PALPATION:  TTP over R UT and levator with trigger points in both                                                                                                                             TREATMENT DATE:  07/03/24:  Supine for manual stretching R upper trpas and levator, with outs of manual cervical distraction  Trigger Point Dry Needling  Initial Treatment: Pt instructed on Dry Needling rational, procedures, and possible side effects. Pt instructed to expect mild to moderate muscle soreness later in the day and/or into the next day.  Pt  instructed in methods to reduce muscle soreness. Pt instructed to continue prescribed HEP. Because Dry Needling was performed over or adjacent to a lung field, pt was educated on S/S of pneumothorax and to seek immediate medical attention should they occur.  Patient was educated on signs and symptoms of infection and other risk factors and advised to seek medical attention should they occur.  Patient verbalized understanding of these instructions and education.   Patient Verbal Consent Given: Yes Education Handout Provided: Previously Provided Muscles Treated: r upper traps, R C 6 paraspinals, performed by Nimesh Riolo, PT, R levator, R rhomboid, R subscap, R middle traps  performed by Elijah Brigitte ALMETA Ivin Iona Performed: No Treatment Response/Outcome: twitch repsonse, decreased muscular resistance   Reviewed home ex program removed the cervical extension, instructed in adapting R levator stretch with self occipital distraction L hand,\ Added B shoulder Er formiddle traps engagement with yellow t band   06/25/24    PATIENT EDUCATION:  Education details: PT eval findings, anticipated POC, progress with PT, ongoing PT POC, initial HEP, postural awareness, posture and body mechanics for typical daily postioning, mobility and household tasks, self-STM techniques to R UT and levator using massage wand, role of TPDN, TPDN rational, procedure, outcomes, potential side effects, and recommended post-treatment exercises/activity, and fee schedule for TPDN and lack of insurance coverage requiring payment at time of service  Person educated: Patient Education method: Explanation, Demonstration, Verbal cues, Tactile cues, and MedBridgeGO app access provided Education comprehension: verbal cues required, tactile cues required, and needs further education   HOME EXERCISE PROGRAM: Access Code: [email or web address] URL: [email or web address] Date: 06/25/2024 Prepared by: Garnette Montclair  Exercises - Seated Upper Trapezius Stretch  -  1 x daily - 7 x weekly - 2 sets - 2 reps - 1 min hold - Seated Levator Scapulae Stretch  - 1 x daily - 7 x weekly - 2 sets - 1 reps - 1 min hold - Seated Cervical Extension AROM  - 3 x daily - 1 x weekly - 1 sets - 10 reps - 5 hold - Seated Thoracic Lumbar Extension with Pectoralis Stretch  - 3 x daily - 7 x weekly - 1 sets - 10 reps - 5 sec hold  ASSESSMENT:  CLINICAL IMPRESSION:07/03/24: first Rx session following eval for combined manual techniques, TDN, and stretching.  Tolerated well, no change during session with radicular Sx.  Will continue to assess and progress as appropriate.   Eval:Patient is a 53 y.o. female referred  for physical therapy evaluation and treatment for R neck pain and R trapezius strain by PCP.  She demonstrates RUE weakness and stiffness throughout the R neck, shoulder, and elbow with postural deficits, trigger points in the R UT and levator with pain and also tingling intermittently into the RUE.   Patient is cleared for PT and would benefit from PT to address the above deficits.   She has good rehab potential to decreased her pain and improve her cervical ROM, shoulder/scapular strength and stability and be able to work painfree.   She has had PT here in past for this problem with good relief and is highly motivated to improve her condition.    OBJECTIVE IMPAIRMENTS: decreased knowledge of condition, decreased ROM, decreased strength, increased fascial restrictions, increased muscle spasms, impaired flexibility, impaired sensation, impaired UE functional use, improper body mechanics, postural dysfunction, and pain.   ACTIVITY LIMITATIONS: lifting, sitting, reach over head, and hygiene/grooming  PARTICIPATION LIMITATIONS: cleaning, laundry, and occupation  PERSONAL FACTORS: Profession and 1-2 comorbidities: ACDF, OA, migraines, HTN  are also affecting patient's functional outcome.   REHAB POTENTIAL:  Good  CLINICAL DECISION MAKING: Evolving/moderate complexity  EVALUATION COMPLEXITY: Moderate   GOALS: Goals reviewed with patient? Yes  SHORT TERM GOALS: Target date: 07/24/24  Patient will be independent with initial HEP.  Baseline: 100% PT assistance required for correct completion  Goal status: INITIAL  2.  Patient will report 50% subjective improvement in pain as indicated by 4/10 worst pain in the neck/R UT   Baseline: 8/10 worst  Goal status: INITIAL    LONG TERM GOALS: Target date: 08/21/24   Patient will be independent with advanced/ongoing HEP to improve outcomes and carryover.  Baseline: 100% PT assistance required for initial HEP, nothing advanced yet Goal status: INITIAL  2.  Patient will report 100% improvement in R shoulder pain to improve QOL.  Baseline: 8/10 worst pain  Goal status: INITIAL  3.  Patient to improve R shoulder AROM to [facility] without pain provocation to allow for increased ease of ADLs.  Baseline: 55 degrees R shoulder ER  Goal status: INITIAL  4.  Patient will demonstrate improved functional UE strength as demonstrated by 5/5 R shoulder strength for ability to do haircare and household and recreational activities without needing rest . Baseline: unable to complete without stopping for rest Goal status: INITIAL  5.  Patient will report 10 points improvement on QuickDash to demonstrate improved functional ability.  Baseline: 34.1 Goal status: INITIAL   PLAN:  PT FREQUENCY: 2x/week  PT DURATION: 8 weeks  PLANNED INTERVENTIONS: 97164- PT Re-evaluation, 97750- Physical Performance Testing, 97110-Therapeutic exercises, 97530- Therapeutic activity, [id number]- Neuromuscular re-education, 97535- Self Care, [id number]- Manual therapy, [id number]-  Electrical stimulation (unattended), 97016- Vasopneumatic device, [id number]- Ultrasound, 02966- Ionotophoresis 4mg /ml Dexamethasone , Patient/Family education, Taping, Joint mobilization, Spinal mobilization, DME instructions,  Cryotherapy, Moist heat, and Biofeedback  PLAN FOR NEXT SESSION: Assess nerve tension testing and add nerve glides PRN, consider trigger point dry needling, MFR to R UT/scapular musculature, Advance HEP and therex to R shoulder strength and cervical flexibility   Thomasenia Dowse L Shanette Tamargo, PT DPT, OCS 07/03/2024, 12:26 PM

## 2024-07-04 ENCOUNTER — Ambulatory Visit (HOSPITAL_COMMUNITY)
Admission: RE | Admit: 2024-07-04 | Discharge: 2024-07-04 | Disposition: A | Discharge status: Home / Self Care | Source: Ambulatory Visit | Attending: Hematology & Oncology | Admitting: Hematology & Oncology

## 2024-07-04 DIAGNOSIS — D473 Essential (hemorrhagic) thrombocythemia: Secondary | ICD-10-CM | POA: Insufficient documentation

## 2024-07-04 DIAGNOSIS — Z9049 Acquired absence of other specified parts of digestive tract: Secondary | ICD-10-CM | POA: Diagnosis not present

## 2024-07-04 DIAGNOSIS — M799 Soft tissue disorder, unspecified: Secondary | ICD-10-CM | POA: Diagnosis not present

## 2024-07-04 DIAGNOSIS — Z96643 Presence of artificial hip joint, bilateral: Secondary | ICD-10-CM | POA: Diagnosis not present

## 2024-07-04 LAB — GLUCOSE, CAPILLARY: Glucose-Capillary: 90 mg/dL (ref 70–99)

## 2024-07-04 MED ORDER — FLUDEOXYGLUCOSE F - 18 (FDG) INJECTION
8.4000 | Freq: Once | INTRAVENOUS | Status: AC
Start: 2024-07-04 — End: 2024-07-04
  Administered 2024-07-04: 8.01 via INTRAVENOUS

## 2024-07-05 ENCOUNTER — Encounter: Payer: Self-pay | Admitting: *Deleted

## 2024-07-05 ENCOUNTER — Ambulatory Visit: Payer: Self-pay | Admitting: Hematology & Oncology

## 2024-07-05 ENCOUNTER — Ambulatory Visit: Admitting: Rehabilitation

## 2024-07-05 DIAGNOSIS — M436 Torticollis: Secondary | ICD-10-CM

## 2024-07-05 DIAGNOSIS — R293 Abnormal posture: Secondary | ICD-10-CM

## 2024-07-05 DIAGNOSIS — M542 Cervicalgia: Secondary | ICD-10-CM | POA: Diagnosis not present

## 2024-07-05 DIAGNOSIS — M6281 Muscle weakness (generalized): Secondary | ICD-10-CM

## 2024-07-05 NOTE — Therapy (Signed)
 OUTPATIENT PHYSICAL THERAPY SHOULDER TREATMENT   Patient Name: Ann Frederick MRN: [id number] DOB:April 21, 1971, 53 y.o., female Today's Date: 07/05/2024  END OF SESSION:    Past Medical History:  Diagnosis Date   Acne    Anemia    history of   Anxiety    Arthritis    Depression    GERD (gastroesophageal reflux disease)    worse while pregnant   Headache    Migraines   Heart murmur    ? heart murmur in past   History of blood transfusion 2013   Hypertension    Metabolic syndrome    Past Surgical History:  Procedure Laterality Date   ANTERIOR FUSION CERVICAL SPINE  2018   CESAREAN SECTION     x 2   CESAREAN SECTION  09/29/2012   Procedure: CESAREAN SECTION;  Surgeon: Alm JAYSON Cook, MD;  Location: [facility];  Service: Obstetrics;  Laterality: N/A;  Repeat edc 10/12/12/REQUEST;Chassity,Dee,Colleen   CHOLECYSTECTOMY N/A 02/26/2024   Procedure: LAPAROSCOPIC CHOLECYSTECTOMY  INTRAOPERATIVE CHOLANGIOGRAM;  Surgeon: Sheldon Standing, MD;  Location: [facility];  Service: General;  Laterality: N/A;   COLONOSCOPY     ESOPHAGOGASTRODUODENOSCOPY  normal    7/12   JOINT REPLACEMENT Bilateral    LAPAROSCOPIC VAGINAL HYSTERECTOMY WITH SALPINGECTOMY Bilateral 02/23/2016   Procedure: LAPAROSCOPIC ASSISTED VAGINAL HYSTERECTOMY WITH bilateral SALPINGECTOMY, right oophorectomy. laporotic repair of incidental cystotomy.;  Surgeon: Alm Cook, MD;  Location: [facility];  Service: Gynecology;  Laterality: Bilateral;   MOUTH SURGERY     Patient Active Problem List   Diagnosis Date Noted   HTN (hypertension) 06/11/2024   Obesity (BMI 30-39.9) 06/11/2024   AKI (acute kidney injury) (HCC) 06/09/2024   Acute on chronic cholecystitis 02/25/2024   Cholecystitis 02/25/2024   Left hip pain 08/27/2019   Class 1 obesity due to excess calories without serious comorbidity with body mass index (BMI) of 34.0 to 34.9 in adult 11/28/2018   Heart palpitations 11/28/2018   OSA (obstructive sleep apnea) 09/14/2018   Neck  pain 04/26/2018   Wellness examination 03/08/2016   S/P laparoscopic assisted vaginal hysterectomy (LAVH) 02/23/2016   Pink eye 06/19/2015   Pain of right thumb 06/19/2015   Sinusitis, acute frontal 02/05/2015   Headache around the eyes 02/05/2015   Back pain 11/01/2014   Routine general medical examination at a health care facility 10/30/2013   Vesicular rash 09/07/2013   Sinusitis 02/02/2013   Viral pharyngitis 03/03/2012   Flank pain 08/13/2011   Inguinal adenopathy 08/13/2011   Dysphagia 04/20/2011   GERD 01/29/2011   FATIGUE 01/29/2011   SCIATICA, BILATERAL 06/18/2010   LEG PAIN, BILATERAL 06/06/2010   HIP PAIN, LEFT 04/01/2010   MASS, LOCALIZED, SUPERFICIAL 03/05/2009   ACNE VULGARIS 07/11/2007   MURMUR 07/11/2007    PCP: Dorina Dallas RIGGERS   REFERRING PROVIDER: Dorina Dallas, PA-C   REFERRING DIAG: M25.511 (ICD-10-CM) - Acute pain of right shoulderS46.811A (ICD-10-CM) - Strain of right trapezius muscle, initial encounter  THERAPY DIAG:  No diagnosis found.  Rationale for Evaluation and Treatment: Rehabilitation  ONSET DATE: last 1-2 months   NEXT MD VISIT:  no f/u for neck pain unless needed;  PET scan 07/04/24 per oncologist SUBJECTIVE:  SUBJECTIVE STATEMENT:   Patient states feels better after dry needling last visit.  States was very sore afterwards but doing better today.  She is still having the lip tingling and RUE tingling intermittently.  Upper thoracic pain is more constant  Patient presents to PT for R sided cervical/upper trap pain that has increased over the last 1-2 months for no apparent reason.  She denies any trauma or MOI.  She  also reports tingling sensation into the R arm into her fingers (all fingers) intermittently.  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   Reports has 3 computer monitors that she views throughout the workday.  She does not have an elevating work station that would allow her to stand and work.    States pain usually better in the AM and is worse  as the workday goes on.   States feels better with correct positioning and cross body shoulder HADD stretching.  She does not recall her HEP from therapy here a year ago, but states that it helped when she was doing it previously.   Of note, Patient was hospitalized in 3/25 for Abd pain, cholecystitis, and cholecystectomy.   She was hospitalized again 2 weeks ago for abdominal pain.  Abdominal CT demonstrated perirenal stranding and she has f/u with oncology and is scheduled for PET scan soon to r/o CA.    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 Yes: NPRS scale: 4/10 now, 8/10 worst Pain location: R UT  Pain description: aching, intermittent pain in RUT and intermittent tingling into the RUE into the fingers (all fingers).   Aggravating factors: working and sitting thru the day at computer, [employer] Relieving factors: stretching neck into sidebending  PRECAUTIONS: h/o ACDF  RED FLAGS: None   WEIGHT BEARING RESTRICTIONS: No  FALLS:  Has patient fallen in last 6 months? 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  DIAGNOSTIC FINDINGS:  N/a  PATIENT SURVEYS:  Quick Dash:  34.1%  COGNITION: Overall cognitive status: Within functional limits for tasks assessed     SENSATION: WFL  POSTURE: Forward head rounded shoulders   UPPER EXTREMITY ROM:   Active ROM Right eval Left eval  Shoulder flexion 180 p 180  Shoulder extension 55 60  Shoulder abduction 175 p 175  Shoulder adduction Reach to opposite shoulder Reach to opposite shoulder  Shoulder internal rotation To L1 To T10  Shoulder external rotation To C7 p To T2  Elbow flexion    Elbow extension    Wrist flexion    Wrist extension    Wrist ulnar deviation    Wrist radial deviation    Wrist pronation    Wrist supination    (Blank rows = not tested)  UPPER EXTREMITY MMT:  MMT Right eval Left eval  Shoulder flexion 4   p 5  Shoulder extension 4- 4+  Shoulder abduction 4 p  4+  Shoulder adduction    Shoulder internal rotation 4 4+  Shoulder external rotation 4- 4  Middle trapezius    Lower trapezius    Elbow flexion 4 5  Elbow extension 4 4  Wrist flexion    Wrist extension    Wrist ulnar deviation    Wrist radial deviation    Wrist pronation    Wrist supination    Grip strength (lbs) 10 kg 8 kg  (Blank rows = not tested)  SHOULDER SPECIAL TESTS: Impingement tests: Hawkins/Kennedy impingement test: positive  SLAP lesions: NT Instability tests: NT Rotator cuff assessment: Infraspinatus test: negative Biceps assessment: NT  NECK SPECIAL TESTS:  Spurling's is negative bilaterally.  Compression is negative, distraction feel good, but no impact on RUE symptoms  JOINT MOBILITY TESTING:  WNL for R shoulder    PALPATION:  TTP over R UT and levator with trigger points in both                                                                                                                             TREATMENT DATE:  07/05/24 Zackary cervical traction x at 12 lb pull x 10' for radicular-like symptoms  MFR, subocciptal release, deep pressure Tp release to R UT and levator Tp x  15'  Combo treatment to L UT/levator area:  100% US  x 1.5 w/cm2 with Hi-Volt estim at 110 mV x 10'  Supine BUE shoulder ER RTB x 20 Supine BUE shoulder HABD RTB x 20 Supine towel cervical traction self assisted--needs teaching from PT for correct angle and correct pull Supine nerve flossing x 20 for RUE median n. Standing nerve flossing x 20 for R median n. Doorway pec/thoracic stretch x 1' x 2 BUE Seated cervical SB stretch x 1' x 2 BUE UBE L1 x 5' backward  07/03/24:  Supine for manual stretching R upper trpas and levator, with outs of manual cervical distraction  Trigger Point Dry Needling  Initial Treatment: Pt instructed on Dry Needling rational, procedures, and possible side effects. Pt instructed to expect mild to moderate muscle soreness later in the day and/or into the next day.  Pt instructed in methods to reduce muscle soreness. Pt instructed to continue prescribed HEP. Because Dry Needling was performed over or adjacent to a lung field, pt was educated on S/S of pneumothorax and to seek immediate medical attention should they occur.  Patient was educated on signs and symptoms of infection and other risk factors and advised to seek medical attention should they occur.  Patient verbalized understanding of these instructions and education.   Patient Verbal Consent Given: Yes Education Handout Provided: Previously Provided Muscles Treated: r upper traps, R C 6 paraspinals, performed by Amy Speaks, PT, R levator, R rhomboid, R subscap, R middle traps  performed by Elijah Brigitte ALMETA Ivin Iona Performed: No Treatment Response/Outcome: twitch repsonse, decreased muscular resistance   Reviewed home ex program removed the cervical extension, instructed in adapting  R levator stretch with self occipital distraction L hand,\ Added B shoulder Er formiddle traps engagement with yellow t band   06/25/24    PATIENT EDUCATION:  Education details: PT eval findings,  anticipated POC, progress with PT, ongoing PT POC, initial HEP, postural awareness, posture and body mechanics for typical daily postioning, mobility and household tasks, self-STM techniques to R UT and levator using massage wand, role of TPDN, TPDN rational, procedure, outcomes, potential side effects, and recommended post-treatment exercises/activity, and fee schedule for TPDN and lack of insurance coverage requiring payment at time of service  Person educated: Patient Education method: Explanation, Demonstration, Verbal cues, Tactile cues, and MedBridgeGO app access provided Education comprehension: verbal cues required, tactile cues required, and needs further education   HOME EXERCISE PROGRAM: Access Code: [email or web address] URL: [email or web address] Date: 06/25/2024 Prepared by: Garnette Montclair  Exercises - Seated Upper Trapezius Stretch  - 1 x daily - 7 x weekly - 2 sets - 2 reps - 1 min hold - Seated Levator Scapulae Stretch  - 1 x daily - 7 x weekly - 2 sets - 1 reps - 1 min hold - Seated Cervical Extension AROM  - 3 x daily - 1 x weekly - 1 sets - 10 reps - 5 hold - Seated Thoracic Lumbar Extension with Pectoralis Stretch  - 3 x daily - 7 x weekly - 1 sets - 10 reps - 5 sec hold  ASSESSMENT:  CLINICAL IMPRESSION: 7/10 Added saunders cervical traction and Combo treatment to see if symptoms will relieve.   Reviewed for correct form Tband exercises.  Added HABD RTB.    07/03/24: first Rx session following eval for combined manual techniques, TDN, and stretching.  Tolerated well, no change during session with radicular Sx.  Will continue to assess and progress as appropriate.   Eval:Patient is a 54 y.o. female referred  for physical therapy evaluation and treatment for R neck pain and R trapezius strain by PCP.  She demonstrates RUE weakness and stiffness throughout the R neck, shoulder, and elbow with postural deficits, trigger points in the R UT and levator with pain and also  tingling intermittently into the RUE.   Patient is cleared for PT and would benefit from PT to address the above deficits.   She has good rehab potential to decreased her pain and improve her cervical ROM, shoulder/scapular strength and stability and be able to work painfree.   She has had PT here in past for this problem with good relief and is highly motivated to improve her condition.    OBJECTIVE IMPAIRMENTS: decreased knowledge of condition, decreased ROM, decreased strength, increased fascial restrictions, increased muscle spasms, impaired flexibility, impaired sensation, impaired UE functional use, improper body mechanics, postural dysfunction, and pain.   ACTIVITY LIMITATIONS: lifting, sitting, reach over head, and hygiene/grooming  PARTICIPATION LIMITATIONS: cleaning, laundry, and occupation  PERSONAL FACTORS: Profession and 1-2 comorbidities: ACDF, OA, migraines, HTN  are also affecting patient's functional outcome.   REHAB POTENTIAL: Good  CLINICAL DECISION MAKING: Evolving/moderate complexity  EVALUATION COMPLEXITY: Moderate   GOALS: Goals reviewed with patient? Yes  SHORT TERM GOALS: Target date: 07/24/24  Patient will be independent with initial HEP.  Baseline: 100% PT assistance required for correct completion  Goal status: INITIAL  2.  Patient will report 50% subjective improvement in pain as indicated by 4/10 worst pain in the neck/R UT   Baseline: 8/10 worst  Goal status: INITIAL    LONG TERM GOALS: Target date: 08/21/24   Patient  will be independent with advanced/ongoing HEP to improve outcomes and carryover.  Baseline: 100% PT assistance required for initial HEP, nothing advanced yet Goal status: INITIAL  2.  Patient will report 100% improvement in R shoulder pain to improve QOL.  Baseline: 8/10 worst pain  Goal status: INITIAL  3.  Patient to improve R shoulder AROM to [facility] without pain provocation to allow for increased ease of ADLs.  Baseline: 55 degrees  R shoulder ER  Goal status: INITIAL  4.  Patient will demonstrate improved functional UE strength as demonstrated by 5/5 R shoulder strength for ability to do haircare and household and recreational activities without needing rest . Baseline: unable to complete without stopping for rest Goal status: INITIAL  5.  Patient will report 10 points improvement on QuickDash to demonstrate improved functional ability.  Baseline: 34.1 Goal status: INITIAL   PLAN:  PT FREQUENCY: 2x/week  PT DURATION: 8 weeks  PLANNED INTERVENTIONS: 97164- PT Re-evaluation, 97750- Physical Performance Testing, 97110-Therapeutic exercises, 97530- Therapeutic activity, [id number]- Neuromuscular re-education, 97535- Self Care, [id number]- Manual therapy, [id number]- Electrical stimulation (unattended), 97016- Vasopneumatic device, 97035- Ultrasound, 02966- Ionotophoresis 4mg /ml Dexamethasone , Patient/Family education, Taping, Joint mobilization, Spinal mobilization, DME instructions, Cryotherapy, Moist heat, and Biofeedback  PLAN FOR NEXT SESSION: Do some postural corrective taping, continue trigger point dry needling, MFR to R UT/scapular musculature, Advance HEP and with more cervical and thoracic extension stretching with joint mobs   Feleshia Zundel, PT DPT, OCS 07/05/2024, 8:55 AM

## 2024-07-06 ENCOUNTER — Encounter: Payer: Self-pay | Admitting: Medical

## 2024-07-06 ENCOUNTER — Ambulatory Visit (INDEPENDENT_AMBULATORY_CARE_PROVIDER_SITE_OTHER): Admitting: Medical

## 2024-07-06 VITALS — BP 133/78 | HR 80 | Temp 98.1°F | Resp 18 | Ht 62.0 in | Wt 165.4 lb

## 2024-07-06 DIAGNOSIS — M5412 Radiculopathy, cervical region: Secondary | ICD-10-CM | POA: Diagnosis not present

## 2024-07-06 DIAGNOSIS — I10 Essential (primary) hypertension: Secondary | ICD-10-CM

## 2024-07-06 DIAGNOSIS — M5410 Radiculopathy, site unspecified: Secondary | ICD-10-CM | POA: Diagnosis not present

## 2024-07-06 DIAGNOSIS — R944 Abnormal results of kidney function studies: Secondary | ICD-10-CM

## 2024-07-06 LAB — COMPREHENSIVE METABOLIC PANEL WITH GFR
ALT: 12 U/L (ref 0–35)
AST: 13 U/L (ref 0–37)
Albumin: 4.3 g/dL (ref 3.5–5.2)
Alkaline Phosphatase: 50 U/L (ref 39–117)
BUN: 8 mg/dL (ref 6–23)
CO2: 32 meq/L (ref 19–32)
Calcium: 9.6 mg/dL (ref 8.4–10.5)
Chloride: 103 meq/L (ref 96–112)
Creatinine, Ser: 0.92 mg/dL (ref 0.40–1.20)
GFR: 71.47 mL/min (ref >60.00)
Glucose, Bld: 79 mg/dL (ref 70–99)
Potassium: 4.5 meq/L (ref 3.5–5.1)
Sodium: 138 meq/L (ref 135–145)
Total Bilirubin: 0.5 mg/dL (ref 0.2–1.2)
Total Protein: 6.8 g/dL (ref 6.0–8.3)

## 2024-07-06 MED ORDER — PREDNISONE 10 MG (21) PO TBPK: ORAL_TABLET | ORAL | 0 refills | Status: DC

## 2024-07-06 MED ORDER — CYCLOBENZAPRINE HCL 10 MG PO TABS: ORAL_TABLET | ORAL | 1 refills | Status: DC

## 2024-07-06 NOTE — Progress Notes (Signed)
 Subjective:    Patient ID: Ann Frederick, female    DOB: [birth year], 53 y.o.   MRN: [id number]  HPI Ann Frederick is a 53 year old female with spinal stenosis and disc herniation who presents with worsening neuropathic pain and tingling.  She experiences worsening neuropathic pain and tingling, primarily affecting her neck, head, hands, and feet. Tingling is present in her lips, cheeks, and around her mouth, which she associates with cervical nerve/disk issues. The pain radiates from her neck to her head, hands, and feet, and is described as severe by the end of the day despite physical therapy, which she finds somewhat helpful.  She has a history of similar symptoms last year, which were less intense and managed with a round of steroids and Flexeril  last year. This year, she notes an acute kidney injury complicating her treatment options. Gabapentin  at 300 mg twice daily provides some relief, but tramadol  is ineffective. She previously used Zanaflex , which helped her sleep but caused her nerves to 'come alive.'  Her current medications include gabapentin  300 mg twice daily, and she has recently stopped metformin  and losartan  due to AKI. She takes Wegovy  weekly for weight loss. Her blood pressure at home has been elevated, ranging from 140s/90s to 150s, and she questions if amlodipine  is causing fluid retention, contributing to her symptoms of carpal tunnel syndrome.  She underwent neck fusion surgery in 2018 but continues to experience pain. She is awaiting a neurosurgical consultation at the end of the month.       Review of Systems  Constitutional:  Negative for chills, fatigue and fever.  HENT:  Negative for congestion and ear pain.   Respiratory:  Negative for chest tightness, shortness of breath and wheezing.   Cardiovascular:  Negative for chest pain and palpitations.  Gastrointestinal:  Negative for abdominal distention, blood in stool, diarrhea and vomiting.  Genitourinary:   Negative for dyspareunia and flank pain.  Musculoskeletal:  Positive for neck pain. Negative for back pain and myalgias.  Skin:  Negative for rash.  Neurological:  Negative for dizziness, syncope, weakness and numbness.  Hematological:  Negative for adenopathy. Does not bruise/bleed easily.  Psychiatric/Behavioral:  Negative for behavioral problems, hallucinations and sleep disturbance. The patient is not nervous/anxious.     Past Medical History:  Diagnosis Date   Acne    Anemia    history of   Anxiety    Arthritis    Depression    GERD (gastroesophageal reflux disease)    worse while pregnant   Headache    Migraines   Heart murmur    ? heart murmur in past   History of blood transfusion 2013   Hypertension    Metabolic syndrome      Social History   Socioeconomic History   Marital status: Married    Spouse name: Not on file   Number of children: 2   Years of education: Not on file   Highest education level: Master's degree (e.g., MA, MS, MEng, MEd, MSW, MBA)  Occupational History   Not on file  Tobacco Use   Smoking status: Never   Smokeless tobacco: Never  Vaping Use   Vaping status: Never Used  Substance and Sexual Activity   Alcohol use: Yes    Comment: 1 glass of wine per week prior to preg   Drug use: No   Sexual activity: Yes  Other Topics Concern   Not on file  Social History Narrative   Not on  file   Social Drivers of Health   Financial Resource Strain: Low Risk  (06/25/2024)   Overall Financial Resource Strain (CARDIA)    Difficulty of Paying Living Expenses: Not hard at all  Food Insecurity: No Food Insecurity (06/25/2024)   Hunger Vital Sign    Worried About Running Out of Food in the Last Year: Never true    Ran Out of Food in the Last Year: Never true  Transportation Needs: No Transportation Needs (06/25/2024)   PRAPARE - Administrator, Civil Service (Medical): No    Lack of Transportation (Non-Medical): No  Physical Activity:  Insufficiently Active (06/25/2024)   Exercise Vital Sign    Days of Exercise per Week: 2 days    Minutes of Exercise per Session: 30 min  Stress: Stress Concern Present (06/25/2024)   [employer] of Occupational Health - Occupational Stress Questionnaire    Feeling of Stress: Very much  Social Connections: Socially Integrated (06/25/2024)   Social Connection and Isolation Panel    Frequency of Communication with Friends and Family: Three times a week    Frequency of Social Gatherings with Friends and Family: Once a week    Attends Religious Services: More than 4 times per year    Active Member of Clubs or Organizations: Yes    Attends Banker Meetings: More than 4 times per year    Marital Status: Married  Catering manager Violence: Not At Risk (06/10/2024)   Humiliation, Afraid, Rape, and Kick questionnaire    Fear of Current or Ex-Partner: No    Emotionally Abused: No    Physically Abused: No    Sexually Abused: No    Past Surgical History:  Procedure Laterality Date   ANTERIOR FUSION CERVICAL SPINE  2018   CESAREAN SECTION     x 2   CESAREAN SECTION  09/29/2012   Procedure: CESAREAN SECTION;  Surgeon: Alm JAYSON Cook, MD;  Location: [facility];  Service: Obstetrics;  Laterality: N/A;  Repeat edc 10/12/12/REQUEST;Chassity,Dee,Colleen   CHOLECYSTECTOMY N/A 02/26/2024   Procedure: LAPAROSCOPIC CHOLECYSTECTOMY  INTRAOPERATIVE CHOLANGIOGRAM;  Surgeon: Sheldon Standing, MD;  Location: [facility];  Service: General;  Laterality: N/A;   COLONOSCOPY     ESOPHAGOGASTRODUODENOSCOPY  normal    7/12   JOINT REPLACEMENT Bilateral    LAPAROSCOPIC VAGINAL HYSTERECTOMY WITH SALPINGECTOMY Bilateral 02/23/2016   Procedure: LAPAROSCOPIC ASSISTED VAGINAL HYSTERECTOMY WITH bilateral SALPINGECTOMY, right oophorectomy. laporotic repair of incidental cystotomy.;  Surgeon: Alm Cook, MD;  Location: [facility];  Service: Gynecology;  Laterality: Bilateral;   MOUTH SURGERY      Family History  Problem  Relation Age of Onset   Depression Mother    Hypertension Mother    Diabetes Mother    Obesity Mother    Thyroid  disease Mother    Cancer Father        ? lung CA   Kidney disease Father    Hydrocephalus Sister    Colon cancer Neg Hx    Esophageal cancer Neg Hx    Liver cancer Neg Hx    Pancreatic cancer Neg Hx    Rectal cancer Neg Hx    Stomach cancer Neg Hx     Allergies  Allergen Reactions   Azithromycin  Diarrhea and Nausea And Vomiting   Dilaudid  [Hydromorphone ] Nausea Only    Current Outpatient Medications on File Prior to Visit  Medication Sig Dispense Refill   amLODipine  (NORVASC ) 5 MG tablet Take 1 tablet (5 mg total) by mouth daily. 30 tablet  0   aspirin  EC 81 MG tablet Take 81 mg by mouth in the morning. Swallow whole.     buPROPion  (WELLBUTRIN  XL) 300 MG 24 hr tablet Take 300 mg by mouth every morning.     busPIRone  (BUSPAR ) 15 MG tablet TAKE 1 TABLET BY MOUTH TWICE A DAY 180 tablet 2   Cholecalciferol (VITAMIN D3) 1000 units CAPS Take 1,000 Units by mouth daily.     estradiol (ESTRACE) 2 MG tablet Take 2 mg by mouth daily.     ezetimibe  (ZETIA ) 10 MG tablet Take 1 tablet (10 mg total) by mouth daily. 90 tablet 3   gabapentin  (NEURONTIN ) 300 MG capsule Take 1 capsule PO daily for 3 days, before increasing to 1 capsule twice daily. 60 capsule 0   [Paused] losartan  (COZAAR ) 25 MG tablet Take 25 mg by mouth daily. (Patient not taking: Reported on 06/20/2024)     LYSINE PO Take 1 capsule by mouth daily.     ondansetron  (ZOFRAN -ODT) 4 MG disintegrating tablet Take 1 tablet (4 mg total) by mouth every 8 (eight) hours as needed. 8 tablet 0   Prenatal Vit-Fe Fumarate-FA (MULTIVITAMIN-PRENATAL) 27-0.8 MG TABS tablet Take 1 tablet by mouth daily at 12 noon.     prochlorperazine  (COMPAZINE ) 5 MG tablet Take 1-2 tablets (5-10 mg total) by mouth every 6 (six) hours as needed for nausea, vomiting or refractory nausea / vomiting. 15 tablet 2   Semaglutide -Weight Management (WEGOVY )  0.25 MG/0.5ML SOAJ One injection 0.25 mg weekly 2 mL 0   UNABLE TO FIND Med Name: KETAMINE  250MG  ODT RASPBERRY     valACYclovir  (VALTREX ) 1000 MG tablet Take 2 tablets (2,000 mg total) by mouth 2 (two) times daily. (Patient taking differently: Take 2,000 mg by mouth 2 (two) times daily. BID prn) 4 tablet 0   Zinc  50 MG TABS Take 50 mg by mouth daily.     zolpidem  (AMBIEN ) 10 MG tablet Take 5-10 mg by mouth at bedtime as needed for sleep.     No current facility-administered medications on file prior to visit.    BP 130/76   Pulse 80   Temp 98.1 F (36.7 C)   Resp 18   Ht 5' 2 (1.575 m)   Wt 165 lb 6.4 oz (75 kg)   LMP 02/11/2016 (Exact Date)   SpO2 95%   BMI 30.25 kg/m           Objective:   Physical Exam   General Mental Status- Alert. General Appearance- Not in acute distress.   Skin General: Color- Normal Color. Moisture- Normal Moisture.  Neck  No JVD.  Chest and Lung Exam Auscultation: Breath Sounds:-CTA  Cardiovascular Auscultation:Rythm- RRR Murmurs & Other Heart Sounds:Auscultation of the heart reveals- No Murmurs.  Abdomen Inspection:-Inspeection Normal. Palpation/Percussion:Note:No mass. Palpation and Percussion of the abdomen reveal- Non Tender, Non Distended + BS, no rebound or guarding.   Neurologic Cranial Nerve exam:- CN III-XII intact(No nystagmus), symmetric smile. Strength:- 5/5 equal and symmetric strength both upper and lower extremities.      Assessment & Plan:   Patient Instructions  Cervical Radiculopathy Worsening cervical radiculopathy with radiating pain and tingling. History of spinal stenosis and disc herniation. Awaiting neurosurgical consultation. Gabapentin  provides partial relief; tramadol  ineffective. Steroids and Flexeril  previously effective. - Increase gabapentin  to 300 mg three times a day. - Prescribe a 6-day taper of prednisone  starting with 6 tablets on the first day. - Prescribe Flexeril  for neck pain, one  tablet at night as needed. -  Follow up with neurosurgeon at the end of the month. - Update in a week regarding blood pressure readings and symptom changes.  Acute Kidney Injury (AKI) Recent AKI led to discontinuation of metformin  and losartan . Concern about NSAIDs' renal impact. Losartan  renal protective but cautious reintroduction if kidney function improves. - Check metabolic panel including GFR. - Consider reintroducing losartan  if kidney function improves. - Use NSAIDs cautiously possible in future but would be sparing and low dose if uses.  Hypertension Elevated home blood pressure readings. Currently on amlodipine , which may cause fluid retention. Considering losartan  if kidney function allows. - Monitor blood pressure readings at home and update in a week. - Consider switching back to losartan  if kidney function allows and blood pressure remains elevated.  General Health Maintenance A1c is 5, indicating no prediabetes. On Wegovy  for weight loss. - Continue Wegovy  for weight loss.  Follow up date to be determined after lab review and update in about a week.    Ifeoluwa Beller, PA-C

## 2024-07-06 NOTE — Patient Instructions (Signed)
 Cervical Radiculopathy Worsening cervical radiculopathy with radiating pain and tingling. History of spinal stenosis and disc herniation. Awaiting neurosurgical consultation. Gabapentin  provides partial relief; tramadol  ineffective. Steroids and Flexeril  previously effective. - Increase gabapentin  to 300 mg three times a day. - Prescribe a 6-day taper of prednisone  starting with 6 tablets on the first day. - Prescribe Flexeril  for neck pain, one tablet at night as needed. - Follow up with neurosurgeon at the end of the month. - Update in a week regarding blood pressure readings and symptom changes.  Acute Kidney Injury (AKI) Recent AKI led to discontinuation of metformin  and losartan . Concern about NSAIDs' renal impact. Losartan  renal protective but cautious reintroduction if kidney function improves. - Check metabolic panel including GFR. - Consider reintroducing losartan  if kidney function improves. - Use NSAIDs cautiously possible in future but would be sparing and low dose if uses.  Hypertension Elevated home blood pressure readings. Currently on amlodipine , which may cause fluid retention. Considering losartan  if kidney function allows. - Monitor blood pressure readings at home and update in a week. - Consider switching back to losartan  if kidney function allows and blood pressure remains elevated.  General Health Maintenance A1c is 5, indicating no prediabetes. On Wegovy  for weight loss. - Continue Wegovy  for weight loss.  Follow up date to be determined after lab review and update in about a week.

## 2024-07-07 ENCOUNTER — Ambulatory Visit: Payer: Self-pay | Admitting: Medical

## 2024-07-07 NOTE — Addendum Note (Signed)
 Addended by: DORINA DALLAS HERO on: 07/07/2024 08:50 AM   Modules accepted: Orders

## 2024-07-09 ENCOUNTER — Encounter: Payer: Self-pay | Admitting: Rehabilitation

## 2024-07-09 ENCOUNTER — Ambulatory Visit: Admitting: Medical

## 2024-07-09 ENCOUNTER — Ambulatory Visit: Admitting: Rehabilitation

## 2024-07-09 DIAGNOSIS — M542 Cervicalgia: Secondary | ICD-10-CM | POA: Diagnosis not present

## 2024-07-09 DIAGNOSIS — R293 Abnormal posture: Secondary | ICD-10-CM

## 2024-07-09 DIAGNOSIS — M25611 Stiffness of right shoulder, not elsewhere classified: Secondary | ICD-10-CM

## 2024-07-09 DIAGNOSIS — M6281 Muscle weakness (generalized): Secondary | ICD-10-CM

## 2024-07-09 DIAGNOSIS — M436 Torticollis: Secondary | ICD-10-CM

## 2024-07-09 NOTE — Therapy (Signed)
 OUTPATIENT PHYSICAL THERAPY SHOULDER TREATMENT   Patient Name: Ann Frederick MRN: [id number] DOB:21-Jun-1971, 53 y.o., female Today's Date: 07/09/2024  END OF SESSION:  PT End of Session - 07/09/24 0939     Visit Number 4    Date for PT Re-Evaluation 08/21/24    PT Start Time 0934    PT Stop Time 1019    PT Time Calculation (min) 45 min    Activity Tolerance Patient tolerated treatment well;No increased pain    Behavior During Therapy WFL for tasks assessed/performed           Past Medical History:  Diagnosis Date   Acne    Anemia    history of   Anxiety    Arthritis    Depression    GERD (gastroesophageal reflux disease)    worse while pregnant   Headache    Migraines   Heart murmur    ? heart murmur in past   History of blood transfusion 2013   Hypertension    Metabolic syndrome    Past Surgical History:  Procedure Laterality Date   ANTERIOR FUSION CERVICAL SPINE  2018   CESAREAN SECTION     x 2   CESAREAN SECTION  09/29/2012   Procedure: CESAREAN SECTION;  Surgeon: Alm JAYSON Cook, MD;  Location: [facility];  Service: Obstetrics;  Laterality: N/A;  Repeat edc 10/12/12/REQUEST;Chassity,Dee,Colleen   CHOLECYSTECTOMY N/A 02/26/2024   Procedure: LAPAROSCOPIC CHOLECYSTECTOMY  INTRAOPERATIVE CHOLANGIOGRAM;  Surgeon: Sheldon Standing, MD;  Location: [facility];  Service: General;  Laterality: N/A;   COLONOSCOPY     ESOPHAGOGASTRODUODENOSCOPY  normal    7/12   JOINT REPLACEMENT Bilateral    LAPAROSCOPIC VAGINAL HYSTERECTOMY WITH SALPINGECTOMY Bilateral 02/23/2016   Procedure: LAPAROSCOPIC ASSISTED VAGINAL HYSTERECTOMY WITH bilateral SALPINGECTOMY, right oophorectomy. laporotic repair of incidental cystotomy.;  Surgeon: Alm Cook, MD;  Location: [facility];  Service: Gynecology;  Laterality: Bilateral;   MOUTH SURGERY     Patient Active Problem List   Diagnosis Date Noted   HTN (hypertension) 06/11/2024   Obesity (BMI 30-39.9) 06/11/2024   AKI (acute kidney injury) (HCC)  06/09/2024   Acute on chronic cholecystitis 02/25/2024   Cholecystitis 02/25/2024   Left hip pain 08/27/2019   Class 1 obesity due to excess calories without serious comorbidity with body mass index (BMI) of 34.0 to 34.9 in adult 11/28/2018   Heart palpitations 11/28/2018   OSA (obstructive sleep apnea) 09/14/2018   Neck pain 04/26/2018   Wellness examination 03/08/2016   S/P laparoscopic assisted vaginal hysterectomy (LAVH) 02/23/2016   Pink eye 06/19/2015   Pain of right thumb 06/19/2015   Sinusitis, acute frontal 02/05/2015   Headache around the eyes 02/05/2015   Back pain 11/01/2014   Routine general medical examination at a health care facility 10/30/2013   Vesicular rash 09/07/2013   Sinusitis 02/02/2013   Viral pharyngitis 03/03/2012   Flank pain 08/13/2011   Inguinal adenopathy 08/13/2011   Dysphagia 04/20/2011   GERD 01/29/2011   FATIGUE 01/29/2011   SCIATICA, BILATERAL 06/18/2010   LEG PAIN, BILATERAL 06/06/2010   HIP PAIN, LEFT 04/01/2010   MASS, LOCALIZED, SUPERFICIAL 03/05/2009   ACNE VULGARIS 07/11/2007   MURMUR 07/11/2007    PCP: Dorina Loving, PA-C   REFERRING PROVIDER: Dorina Loving, PA-C   REFERRING DIAG: M25.511 (ICD-10-CM) - Acute pain of right shoulderS46.811A (ICD-10-CM) - Strain of right trapezius muscle, initial encounter  THERAPY DIAG:  Neck pain  Muscle weakness (generalized)  Neck stiffness  Abnormal posture  Cervicalgia  Stiffness  of right shoulder, not elsewhere classified  Rationale for Evaluation and Treatment: Rehabilitation  ONSET DATE: last 1-2 months   NEXT MD VISIT:  no f/u for neck pain unless needed;  PET scan 07/04/24 per oncologist SUBJECTIVE:                                                                                                                                                                                      SUBJECTIVE STATEMENT:   Patient reports feeling some better.  States MD told her to take her  neurontin  TID rather than BID and it's helping.   Her pain has been less this weekend.  She states combo US /ESU treatment over her Tp's helped as well.   Not as sure about traction as to whether it helped or not.  States pain today is 2/10  EVAL:  Patient presents to PT for R sided cervical/upper trap pain that has increased over the last 1-2 months for no apparent reason.  She denies any trauma or MOI.  She  also reports tingling sensation into the R arm into her fingers (all fingers) intermittently.  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   Reports has 3 computer monitors that she views throughout the workday.  She does not have an elevating work station that would allow her to stand and work.    States pain usually better in the AM and is worse  as the workday goes on.   States feels better with correct positioning and cross body shoulder HADD stretching.  She does not recall her HEP from therapy here a year ago, but states that it helped when she was doing it previously.   Of note, Patient was hospitalized in 3/25 for Abd pain, cholecystitis, and cholecystectomy.   She was hospitalized again 2 weeks ago for abdominal pain.  Abdominal CT demonstrated perirenal stranding and she has f/u with oncology and is scheduled for PET scan soon to r/o CA.    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 Yes: NPRS scale: 2/10 now, 8/10 worst Pain location: R UT  Pain description: aching, intermittent pain in RUT and intermittent tingling into the RUE into the fingers (all fingers).   Aggravating factors: working and sitting thru the day at computer, [employer] Relieving factors: stretching neck into sidebending  PRECAUTIONS: h/o ACDF  RED FLAGS: None   WEIGHT BEARING  RESTRICTIONS: No  FALLS:  Has patient fallen in last 6 months? 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  DIAGNOSTIC FINDINGS:  N/a  PATIENT SURVEYS:  Quick Dash: 34.1%  COGNITION: Overall cognitive status: Within functional limits for tasks assessed     SENSATION: WFL  POSTURE: Forward head rounded shoulders   UPPER EXTREMITY ROM:   Active ROM Right eval Left eval  Shoulder flexion 180 p 180  Shoulder extension 55 60  Shoulder abduction 175 p 175  Shoulder adduction Reach to opposite shoulder Reach to opposite shoulder  Shoulder internal rotation To L1 To T10  Shoulder external rotation To C7 p To T2  Elbow flexion    Elbow extension    Wrist flexion    Wrist extension    Wrist ulnar deviation    Wrist radial deviation    Wrist pronation    Wrist supination    (Blank rows = not tested)  UPPER EXTREMITY MMT:  MMT Right eval Left eval  Shoulder flexion 4   p 5  Shoulder extension 4- 4+  Shoulder abduction 4 p 4+  Shoulder adduction    Shoulder internal rotation 4 4+  Shoulder external rotation 4- 4  Middle trapezius    Lower trapezius    Elbow flexion 4 5  Elbow extension 4 4  Wrist flexion    Wrist extension    Wrist ulnar deviation    Wrist radial deviation    Wrist pronation    Wrist supination    Grip strength (lbs) 10 kg 8 kg  (Blank rows = not tested)  SHOULDER SPECIAL TESTS: Impingement tests: Hawkins/Kennedy impingement test: positive  SLAP lesions: NT Instability tests: NT Rotator cuff assessment: Infraspinatus test: negative Biceps assessment: NT  NECK SPECIAL  TESTS:  Spurling's is negative bilaterally.  Compression is negative, distraction feel good, but no impact on RUE symptoms  JOINT MOBILITY TESTING:  WNL for R shoulder    PALPATION:  TTP over R UT and levator with trigger points in both                                                                                                                             TREATMENT DATE:  07/09/24 THERAPEUTIC EXERCISE: To improve strength and endurance.  Demonstration, verbal and tactile cues throughout for technique. UBE L2 x 5' back THERAPEUTIC ACTIVITIES: To improve functional performance.  Demonstration, verbal and tactile cues throughout for technique. B shoulder extension RTB x 20 B rowing Blue TB x 20 Doorway B shoulder ER RTB x 20 Doorway B shoulder HABD RTB x 20 Corner pec stretch x 1' x  2 Foam roll lying palms up x 2' Foam roll with alternate shoulder flexion x 20 BUE Foam roll w/ scapular depression x 20 BUE Seated L cervical SB stretch x 1' x 2   Combo US /ESU to L upper trap/levator Tp:   100% US  x 1.5 W/cm2 with Hi-Volt ESU machine default settings x 120 mV x 10'  07/05/24 Saunders cervical traction x at 12 lb pull x 10' for radicular-like symptoms  MFR, subocciptal release, deep pressure Tp release to R UT and levator Tp x 15'  Combo treatment to L UT/levator area:  100% US  x 1.5 w/cm2 with Hi-Volt estim at 110 mV x 10'  Supine BUE shoulder ER RTB x 20 Supine BUE shoulder HABD RTB x 20 Supine towel cervical traction self assisted--needs teaching from PT for correct angle and correct pull Supine nerve flossing x 20 for RUE median n. Standing nerve flossing x 20 for R median n. Doorway pec/thoracic stretch x 1' x 2 BUE Seated cervical SB stretch x 1' x 2 BUE UBE L1 x 5' backward  07/03/24:  Supine for manual stretching R upper trpas and levator, with outs of manual cervical distraction  Trigger Point Dry Needling  Initial Treatment: Pt instructed on Dry Needling rational,  procedures, and possible side effects. Pt instructed to expect mild to moderate muscle soreness later in the day and/or into the next day.  Pt instructed in methods to reduce muscle soreness. Pt instructed to continue prescribed HEP. Because Dry Needling was performed over or adjacent to a lung field, pt was educated on S/S of pneumothorax and to seek immediate medical attention should they occur.  Patient was educated on signs and symptoms of infection and other risk factors and advised to seek medical attention should they occur.  Patient verbalized understanding of these instructions and education.   Patient Verbal Consent Given: Yes Education Handout Provided: Previously Provided Muscles Treated: r upper traps, R C 6 paraspinals, performed by Amy Speaks, PT, R levator, R rhomboid, R subscap, R middle traps  performed by Elijah Brigitte ALMETA Ivin Iona Performed: No Treatment Response/Outcome: twitch repsonse, decreased muscular resistance   Reviewed home ex program removed the cervical extension, instructed in adapting R levator stretch with self occipital distraction L hand,\ Added B shoulder Er formiddle traps engagement with yellow t band   06/25/24    PATIENT EDUCATION:  Education details: PT eval findings, anticipated POC, progress with PT, ongoing PT POC, initial HEP, postural awareness, posture and body mechanics for typical daily postioning, mobility and household tasks, self-STM techniques to R UT and levator using massage wand, role of TPDN, TPDN rational, procedure, outcomes, potential side effects, and recommended post-treatment exercises/activity, and fee schedule for TPDN and lack of insurance coverage requiring payment at time of service  Person educated: Patient Education method: Explanation, Demonstration, Verbal cues, Tactile cues, and MedBridgeGO app access provided Education comprehension: verbal cues required, tactile cues required, and needs further education    HOME EXERCISE PROGRAM: Access Code: [email or web address] URL: [email or web address] Date: 06/25/2024 Prepared by: Garnette Montclair  Exercises - Seated Upper Trapezius Stretch  - 1 x daily - 7 x weekly - 2 sets - 2 reps - 1 min hold - Seated Levator Scapulae Stretch  - 1 x daily - 7 x weekly - 2 sets - 1 reps - 1 min hold - Seated Cervical Extension AROM  - 3 x daily - 1 x weekly - 1 sets - 10 reps - 5 hold - Seated Thoracic Lumbar Extension  with Pectoralis Stretch  - 3 x daily - 7 x weekly - 1 sets - 10 reps - 5 sec hold  ASSESSMENT:  CLINICAL IMPRESSION: Patient got more relief with Combo US /ESU last visit than with traction.  Wants to defer traction today.  Not sure she wants anymore dry needling either, even I think it helped her.   Pain is decreasing and she is progressing with her Home program.   Needs further education and postural strengthening with MFR/modalities and likely revisit traction  07/03/24: first Rx session following eval for combined manual techniques, TDN, and stretching.  Tolerated well, no change during session with radicular Sx.  Will continue to assess and progress as appropriate.   Eval:Patient is a 53 y.o. female referred  for physical therapy evaluation and treatment for R neck pain and R trapezius strain by PCP.  She demonstrates RUE weakness and stiffness throughout the R neck, shoulder, and elbow with postural deficits, trigger points in the R UT and levator with pain and also tingling intermittently into the RUE.   Patient is cleared for PT and would benefit from PT to address the above deficits.   She has good rehab potential to decreased her pain and improve her cervical ROM, shoulder/scapular strength and stability and be able to work painfree.   She has had PT here in past for this problem with good relief and is highly motivated to improve her condition.    OBJECTIVE IMPAIRMENTS: decreased knowledge of condition, decreased ROM, decreased strength,  increased fascial restrictions, increased muscle spasms, impaired flexibility, impaired sensation, impaired UE functional use, improper body mechanics, postural dysfunction, and pain.   ACTIVITY LIMITATIONS: lifting, sitting, reach over head, and hygiene/grooming  PARTICIPATION LIMITATIONS: cleaning, laundry, and occupation  PERSONAL FACTORS: Profession and 1-2 comorbidities: ACDF, OA, migraines, HTN  are also affecting patient's functional outcome.   REHAB POTENTIAL: Good  CLINICAL DECISION MAKING: Evolving/moderate complexity  EVALUATION COMPLEXITY: Moderate   GOALS: Goals reviewed with patient? Yes  SHORT TERM GOALS: Target date: 07/24/24  Patient will be independent with initial HEP.  Baseline: 100% PT assistance required for correct completion  Goal status: INITIAL  2.  Patient will report 50% subjective improvement in pain as indicated by 4/10 worst pain in the neck/R UT   Baseline: 8/10 worst  Goal status: INITIAL    LONG TERM GOALS: Target date: 08/21/24   Patient will be independent with advanced/ongoing HEP to improve outcomes and carryover.  Baseline: 100% PT assistance required for initial HEP, nothing advanced yet Goal status: INITIAL  2.  Patient will report 100% improvement in R shoulder pain to improve QOL.  Baseline: 8/10 worst pain  Goal status: INITIAL  3.  Patient to improve R shoulder AROM to [facility] without pain provocation to allow for increased ease of ADLs.  Baseline: 55 degrees R shoulder ER  Goal status: INITIAL  4.  Patient will demonstrate improved functional UE strength as demonstrated by 5/5 R shoulder strength for ability to do haircare and household and recreational activities without needing rest . Baseline: unable to complete without stopping for rest Goal status: INITIAL  5.  Patient will report 10 points improvement on QuickDash to demonstrate improved functional ability.  Baseline: 34.1 Goal status: INITIAL   PLAN:  PT FREQUENCY:  2x/week  PT DURATION: 8 weeks  PLANNED INTERVENTIONS: 97164- PT Re-evaluation, 97750- Physical Performance Testing, 97110-Therapeutic exercises, 97530- Therapeutic activity, [id number]- Neuromuscular re-education, 97535- Self Care, [id number]- Manual therapy, [id number]- Electrical stimulation (unattended), 97016- Vasopneumatic device,  02964- Ultrasound, 02966- Ionotophoresis 4mg /ml Dexamethasone , Patient/Family education, Taping, Joint mobilization, Spinal mobilization, DME instructions, Cryotherapy, Moist heat, and Biofeedback  PLAN FOR NEXT SESSION: Do some postural corrective taping, continue trigger point dry needling, MFR to R UT/scapular musculature, Advance HEP and with more cervical and thoracic extension stretching with joint mobs   Cele Mote, PT DPT, OCS 07/09/2024, 10:04 PM

## 2024-07-12 ENCOUNTER — Telehealth

## 2024-07-12 ENCOUNTER — Ambulatory Visit: Admitting: Rehabilitation

## 2024-07-12 DIAGNOSIS — M542 Cervicalgia: Secondary | ICD-10-CM

## 2024-07-12 DIAGNOSIS — R293 Abnormal posture: Secondary | ICD-10-CM

## 2024-07-12 DIAGNOSIS — M6281 Muscle weakness (generalized): Secondary | ICD-10-CM

## 2024-07-12 DIAGNOSIS — M436 Torticollis: Secondary | ICD-10-CM

## 2024-07-12 DIAGNOSIS — M25611 Stiffness of right shoulder, not elsewhere classified: Secondary | ICD-10-CM

## 2024-07-12 NOTE — Therapy (Signed)
 OUTPATIENT PHYSICAL THERAPY SHOULDER TREATMENT   Patient Name: Ann Frederick MRN: [id number] DOB:February 28, 1971, 53 y.o., female Today's Date: 07/12/2024  END OF SESSION:  PT End of Session - 07/12/24 0937     Visit Number 5    Date for PT Re-Evaluation 08/21/24    PT Start Time 0931    PT Stop Time 1030    PT Time Calculation (min) 59 min    Activity Tolerance Patient tolerated treatment well;No increased pain    Behavior During Therapy WFL for tasks assessed/performed            Past Medical History:  Diagnosis Date   Acne    Anemia    history of   Anxiety    Arthritis    Depression    GERD (gastroesophageal reflux disease)    worse while pregnant   Headache    Migraines   Heart murmur    ? heart murmur in past   History of blood transfusion 2013   Hypertension    Metabolic syndrome    Past Surgical History:  Procedure Laterality Date   ANTERIOR FUSION CERVICAL SPINE  2018   CESAREAN SECTION     x 2   CESAREAN SECTION  09/29/2012   Procedure: CESAREAN SECTION;  Surgeon: Alm JAYSON Cook, MD;  Location: [facility];  Service: Obstetrics;  Laterality: N/A;  Repeat edc 10/12/12/REQUEST;Chassity,Dee,Colleen   CHOLECYSTECTOMY N/A 02/26/2024   Procedure: LAPAROSCOPIC CHOLECYSTECTOMY  INTRAOPERATIVE CHOLANGIOGRAM;  Surgeon: Sheldon Standing, MD;  Location: [facility];  Service: General;  Laterality: N/A;   COLONOSCOPY     ESOPHAGOGASTRODUODENOSCOPY  normal    7/12   JOINT REPLACEMENT Bilateral    LAPAROSCOPIC VAGINAL HYSTERECTOMY WITH SALPINGECTOMY Bilateral 02/23/2016   Procedure: LAPAROSCOPIC ASSISTED VAGINAL HYSTERECTOMY WITH bilateral SALPINGECTOMY, right oophorectomy. laporotic repair of incidental cystotomy.;  Surgeon: Alm Cook, MD;  Location: [facility];  Service: Gynecology;  Laterality: Bilateral;   MOUTH SURGERY     Patient Active Problem List   Diagnosis Date Noted   HTN (hypertension) 06/11/2024   Obesity (BMI 30-39.9) 06/11/2024   AKI (acute kidney injury) (HCC)  06/09/2024   Acute on chronic cholecystitis 02/25/2024   Cholecystitis 02/25/2024   Left hip pain 08/27/2019   Class 1 obesity due to excess calories without serious comorbidity with body mass index (BMI) of 34.0 to 34.9 in adult 11/28/2018   Heart palpitations 11/28/2018   OSA (obstructive sleep apnea) 09/14/2018   Neck pain 04/26/2018   Wellness examination 03/08/2016   S/P laparoscopic assisted vaginal hysterectomy (LAVH) 02/23/2016   Pink eye 06/19/2015   Pain of right thumb 06/19/2015   Sinusitis, acute frontal 02/05/2015   Headache around the eyes 02/05/2015   Back pain 11/01/2014   Routine general medical examination at a health care facility 10/30/2013   Vesicular rash 09/07/2013   Sinusitis 02/02/2013   Viral pharyngitis 03/03/2012   Flank pain 08/13/2011   Inguinal adenopathy 08/13/2011   Dysphagia 04/20/2011   GERD 01/29/2011   FATIGUE 01/29/2011   SCIATICA, BILATERAL 06/18/2010   LEG PAIN, BILATERAL 06/06/2010   HIP PAIN, LEFT 04/01/2010   MASS, LOCALIZED, SUPERFICIAL 03/05/2009   ACNE VULGARIS 07/11/2007   MURMUR 07/11/2007    PCP: Dorina Loving, PA-C   REFERRING PROVIDER: Dorina Loving, PA-C   REFERRING DIAG: M25.511 (ICD-10-CM) - Acute pain of right shoulderS46.811A (ICD-10-CM) - Strain of right trapezius muscle, initial encounter  THERAPY DIAG:  Neck pain  Muscle weakness (generalized)  Abnormal posture  Cervicalgia  Neck stiffness  Stiffness of right shoulder, not elsewhere classified  Rationale for Evaluation and Treatment: Rehabilitation  ONSET DATE: last 1-2 months   NEXT MD VISIT:  no f/u for neck pain unless needed;  PET scan 07/04/24 per oncologist SUBJECTIVE:                                                                                                                                                                                      SUBJECTIVE STATEMENT:   07/12/24:  States pain is increased today, but she has finished her  prednisone  now.   States having the R UT and medial scapular border pain with N/T into the RUE to her fingers today.  Rates pain today 4/10.  She reports doing some standing at her home computer workstation now, but still needs to obtain an elevating tray table work desk   EVAL:  Patient presents to PT for R sided cervical/upper trap pain that has increased over the last 1-2 months for no apparent reason.  She denies any trauma or MOI.  She  also reports tingling sensation into the R arm into her fingers (all fingers) intermittently.  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   Reports has 3 computer monitors that she views throughout the workday.  She does not have an elevating work station that would allow her to stand and work.    States pain usually better in the AM and is worse  as the workday goes on.   States feels better with correct positioning and cross body shoulder HADD stretching.  She does not recall her HEP from therapy here a year ago, but states that it helped when she was doing it previously.   Of note, Patient was hospitalized in 3/25 for Abd pain, cholecystitis, and cholecystectomy.   She was hospitalized again 2 weeks ago for abdominal pain.  Abdominal CT demonstrated perirenal stranding and she has f/u with oncology and is scheduled for PET scan soon to r/o CA.    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 Yes: NPRS scale: 2/10 now, 8/10 worst Pain location: R UT  Pain description: aching, intermittent pain in RUT and intermittent tingling into the RUE into the fingers (all fingers).   Aggravating factors: working and sitting thru the day at computer, [employer] Relieving factors: stretching neck into sidebending  PRECAUTIONS: h/o ACDF  RED  FLAGS: None   WEIGHT BEARING RESTRICTIONS: No  FALLS:  Has patient fallen in last 6 months? 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  DIAGNOSTIC FINDINGS:  N/a  PATIENT SURVEYS:  Quick Dash: 34.1%  COGNITION: Overall cognitive status: Within functional limits for tasks assessed     SENSATION: WFL  POSTURE: Forward head rounded shoulders   UPPER EXTREMITY ROM:   Active ROM Right eval Left eval  Shoulder flexion 180 p 180  Shoulder extension 55 60  Shoulder abduction 175 p 175  Shoulder adduction Reach to opposite shoulder Reach to opposite shoulder  Shoulder internal rotation To L1 To T10  Shoulder external rotation To C7 p To T2  Elbow flexion    Elbow extension    Wrist flexion    Wrist extension    Wrist ulnar deviation    Wrist radial deviation    Wrist pronation    Wrist supination    (Blank rows = not tested)  UPPER EXTREMITY MMT:  MMT Right eval Left eval  Shoulder flexion 4   p 5  Shoulder extension 4- 4+  Shoulder abduction 4 p 4+  Shoulder adduction    Shoulder internal rotation 4 4+  Shoulder external rotation 4- 4  Middle trapezius    Lower trapezius    Elbow flexion 4 5  Elbow extension 4 4  Wrist flexion    Wrist extension    Wrist ulnar deviation    Wrist radial deviation    Wrist pronation    Wrist supination    Grip strength (lbs) 10 kg 8 kg  (Blank rows = not tested)  SHOULDER SPECIAL TESTS: Impingement tests: Hawkins/Kennedy impingement test: positive  SLAP lesions: NT Instability tests: NT Rotator cuff assessment: Infraspinatus test: negative Biceps  assessment: NT  NECK SPECIAL TESTS:  Spurling's is negative bilaterally.  Compression is negative, distraction feel good, but no impact on RUE symptoms  JOINT MOBILITY TESTING:  WNL for R shoulder    PALPATION:  TTP over R UT and levator with trigger points in both                                                                                                                             TREATMENT DATE:  07/12/24  THERAPEUTIC EXERCISE: To improve strength.  Demonstration, verbal and tactile cues throughout for technique. UBE L2 x 6' Back  Rowing GTB x 30 BUE Rowing GTB x 30 RUE only Foam roll Shoulder ER RTB x 30 BUE Foam roll shoulder HABD RTB x 30 BUE Foam roll scapular depression Prone HABD x 20 BUE Prone LT raises x 20 alternating BUE L Cervical  SB stretch x 1' x 2  POE w/ cervical retraction x 20 Prone grade 3-4 upper thoracic P/A mobilization to T1-T4 x 10-20 ea level  Combo US /ESU to L upper trap/levator Tp:   100% US  x 1.5 W/cm2 with Hi-Volt ESU machine default settings x 120 mV x 8'  Intermittent Cervical Traction 60 sec hold/20 sec release @ 22 lb pull x 15'  07/09/24 THERAPEUTIC EXERCISE: To improve strength and endurance.  Demonstration, verbal and tactile cues throughout for technique. UBE L2 x 5' back THERAPEUTIC ACTIVITIES: To improve functional performance.  Demonstration, verbal and tactile cues throughout for technique. B shoulder extension RTB x 20 B rowing Blue TB x 20 Doorway B shoulder ER RTB x 20 Doorway B shoulder HABD RTB x 20 Corner pec stretch x 1' x 2 Foam roll lying palms up x 2' Foam roll with alternate shoulder flexion x 20 BUE Foam roll w/ scapular depression x 20 BUE Seated L cervical SB stretch x 1' x 2   Combo US /ESU to L upper trap/levator Tp:   100% US  x 1.5 W/cm2 with Hi-Volt ESU machine default settings x 120 mV x 10'  07/05/24 Saunders cervical traction x at 12 lb pull x 10' for radicular-like symptoms  MFR, subocciptal  release, deep pressure Tp release to R UT and levator Tp x 15'  Combo treatment to L UT/levator area:  100% US  x 1.5 w/cm2 with Hi-Volt estim at 110 mV x 10'  Supine BUE shoulder ER RTB x 20 Supine BUE shoulder HABD RTB x 20 Supine towel cervical traction self assisted--needs teaching from PT for correct angle and correct pull Supine nerve flossing x 20 for RUE median n. Standing nerve flossing x 20 for R median n. Doorway pec/thoracic stretch x 1' x 2 BUE Seated cervical SB stretch x 1' x 2 BUE UBE L1 x 5' backward  07/03/24:  Supine for manual stretching R upper trpas and levator, with outs of manual cervical distraction  Trigger Point Dry Needling  Initial Treatment: Pt instructed on Dry Needling rational, procedures, and possible side effects. Pt instructed to expect mild to moderate muscle soreness later in the day and/or into the next day.  Pt instructed in methods to reduce muscle soreness. Pt instructed to continue prescribed HEP. Because Dry Needling was performed over or adjacent to a lung field, pt was educated on S/S of pneumothorax and to seek immediate medical attention should they occur.  Patient was educated on signs and symptoms of infection and other risk factors and advised to seek medical attention should they occur.  Patient verbalized understanding of these instructions and education.   Patient Verbal Consent Given: Yes Education Handout Provided: Previously Provided Muscles Treated: r upper traps, R C 6 paraspinals, performed by Amy Speaks, PT, R levator, R rhomboid, R subscap, R middle traps  performed by Elijah Brigitte ALMETA Ivin Iona Performed: No Treatment Response/Outcome: twitch repsonse, decreased muscular resistance   Reviewed home ex program removed the cervical extension, instructed in adapting R levator stretch with self occipital distraction L hand,\ Added B shoulder Er formiddle traps engagement with yellow t band   06/25/24    PATIENT  EDUCATION:  Education details: PT eval findings, anticipated POC, progress with PT, ongoing PT POC, initial HEP, postural awareness, posture and body mechanics for typical daily postioning, mobility and household tasks, self-STM techniques to R UT and levator using massage wand, role of TPDN, TPDN rational, procedure, outcomes, potential side effects, and recommended post-treatment exercises/activity, and fee  schedule for TPDN and lack of insurance coverage requiring payment at time of service  Person educated: Patient Education method: Explanation, Demonstration, Verbal cues, Tactile cues, and MedBridgeGO app access provided Education comprehension: verbal cues required, tactile cues required, and needs further education   HOME EXERCISE PROGRAM: Access Code: [email or web address] URL: [email or web address] Date: 06/25/2024 Prepared by: Garnette Montclair  Exercises - Seated Upper Trapezius Stretch  - 1 x daily - 7 x weekly - 2 sets - 2 reps - 1 min hold - Seated Levator Scapulae Stretch  - 1 x daily - 7 x weekly - 2 sets - 1 reps - 1 min hold - Seated Cervical Extension AROM  - 3 x daily - 1 x weekly - 1 sets - 10 reps - 5 hold - Seated Thoracic Lumbar Extension with Pectoralis Stretch  - 3 x daily - 7 x weekly - 1 sets - 10 reps - 5 sec hold  ASSESSMENT:  CLINICAL IMPRESSION: 07/12/24  Revisited cervical traction today, will reassess effects next visit; esp regarding N/T in the RUE.  Combo treatment continues to work well for pain relief.  Progressing shoulder and scapular strengthening each visit.    07/03/24: first Rx session following eval for combined manual techniques, TDN, and stretching.  Tolerated well, no change during session with radicular Sx.  Will continue to assess and progress as appropriate.   Eval:Patient is a 53 y.o. female referred  for physical therapy evaluation and treatment for R neck pain and R trapezius strain by PCP.  She demonstrates RUE weakness and stiffness  throughout the R neck, shoulder, and elbow with postural deficits, trigger points in the R UT and levator with pain and also tingling intermittently into the RUE.   Patient is cleared for PT and would benefit from PT to address the above deficits.   She has good rehab potential to decreased her pain and improve her cervical ROM, shoulder/scapular strength and stability and be able to work painfree.   She has had PT here in past for this problem with good relief and is highly motivated to improve her condition.    OBJECTIVE IMPAIRMENTS: decreased knowledge of condition, decreased ROM, decreased strength, increased fascial restrictions, increased muscle spasms, impaired flexibility, impaired sensation, impaired UE functional use, improper body mechanics, postural dysfunction, and pain.   ACTIVITY LIMITATIONS: lifting, sitting, reach over head, and hygiene/grooming  PARTICIPATION LIMITATIONS: cleaning, laundry, and occupation  PERSONAL FACTORS: Profession and 1-2 comorbidities: ACDF, OA, migraines, HTN  are also affecting patient's functional outcome.   REHAB POTENTIAL: Good  CLINICAL DECISION MAKING: Evolving/moderate complexity  EVALUATION COMPLEXITY: Moderate   GOALS: Goals reviewed with patient? Yes  SHORT TERM GOALS: Target date: 07/24/24  Patient will be independent with initial HEP.  Baseline: 100% PT assistance required for correct completion  Goal status: INITIAL  2.  Patient will report 50% subjective improvement in pain as indicated by 4/10 worst pain in the neck/R UT   Baseline: 8/10 worst  Goal status: INITIAL    LONG TERM GOALS: Target date: 08/21/24   Patient will be independent with advanced/ongoing HEP to improve outcomes and carryover.  Baseline: 100% PT assistance required for initial HEP, nothing advanced yet Goal status: INITIAL  2.  Patient will report 100% improvement in R shoulder pain to improve QOL.  Baseline: 8/10 worst pain  Goal status: INITIAL  3.   Patient to improve R shoulder AROM to [facility] without pain provocation to allow for increased ease of ADLs.  Baseline: 55 degrees  R shoulder ER  Goal status: INITIAL  4.  Patient will demonstrate improved functional UE strength as demonstrated by 5/5 R shoulder strength for ability to do haircare and household and recreational activities without needing rest . Baseline: unable to complete without stopping for rest Goal status: INITIAL  5.  Patient will report 10 points improvement on QuickDash to demonstrate improved functional ability.  Baseline: 34.1 Goal status: INITIAL   PLAN:  PT FREQUENCY: 2x/week  PT DURATION: 8 weeks  PLANNED INTERVENTIONS: 97164- PT Re-evaluation, 97750- Physical Performance Testing, 97110-Therapeutic exercises, 97530- Therapeutic activity, [id number]- Neuromuscular re-education, 97535- Self Care, [id number]- Manual therapy, [id number]- Electrical stimulation (unattended), 97016- Vasopneumatic device, 97035- Ultrasound, 02966- Ionotophoresis 4mg /ml Dexamethasone , Patient/Family education, Taping, Joint mobilization, Spinal mobilization, DME instructions, Cryotherapy, Moist heat, and Biofeedback  PLAN FOR NEXT SESSION: Do some postural corrective taping, continue trigger point dry needling, MFR to R UT/scapular musculature, Advance HEP and with more cervical and thoracic extension stretching with joint mobs   Rahshawn Remo, PT DPT, OCS 07/12/2024, 12:24 PM

## 2024-07-13 ENCOUNTER — Encounter: Payer: Self-pay | Admitting: Medical

## 2024-07-13 ENCOUNTER — Telehealth: Admitting: Medical

## 2024-07-13 DIAGNOSIS — I10 Essential (primary) hypertension: Secondary | ICD-10-CM

## 2024-07-13 DIAGNOSIS — M5412 Radiculopathy, cervical region: Secondary | ICD-10-CM | POA: Diagnosis not present

## 2024-07-13 MED ORDER — METHYLPREDNISOLONE 4 MG PO TABS
ORAL_TABLET | ORAL | 0 refills | Status: DC
Start: 2024-07-13 — End: 2024-07-20

## 2024-07-13 NOTE — Patient Instructions (Signed)
 Radicular pain from cervical spine Radicular pain with numbness and muscle pain, relieved by steroid taper but recurred post-taper. Medrol  considered for short-term relief. Gabapentin  adjustment for neuropathic pain. NSAIDs avoided due to renal and hypertensive risks. - Prescribe Medrol  4 mg, 1 tablet daily for 7 days. - Increase gabapentin  to 600 mg at night, maintain 300 mg in the morning and at lunch. - Adjust treatment based on neurosurgeon's recommendations. - Avoid NSAIDs.  Hypertension Elevated blood pressure likely due to prednisone  and pain. Losartan  25 mg previously effective. Consider dose increase if hypertension persists. Avoid NSAIDs and HCTZ due to renal and electrolyte concerns. - Continue losartan  25 mg daily. - Monitor blood pressure regularly and report readings. - Consider increasing losartan  to 50 mg if blood pressure remains elevated after 7-10 days. - Order repeat metabolic panel around August 7th to assess renal function.

## 2024-07-13 NOTE — Progress Notes (Signed)
 Virtual Visit via Video Note  I connected with Ann Frederick on 07/13/24 at  9:40 AM EDT by a video enabled telemedicine application and verified that I am speaking with the correct person using two identifiers.  Location: Patient: car parked [place] Provider: office [place]   I discussed the limitations of evaluation and management by telemedicine and the availability of in person appointments. The patient expressed understanding and agreed to proceed.  History of Present Illness: Discussed the use of AI scribe software for clinical note transcription with the patient, who gave verbal consent to proceed.   History of Present Illness         Ann Frederick is a 53 year old female with hypertension and radicular pain who presents with recurrent flares of numbness and muscle pain after completing a steroid taper.  She completed a six-day steroid taper ending on Wednesday, which initially provided significant relief from her symptoms. However, the flares have returned, characterized by numbness in her hands and muscle pain, particularly in the afternoon and worsening by evening. The pain is described as 'tingly' during the day but escalates to a 'full flare' by 5 PM.  She started taking losartan  on Monday to manage her blood pressure, which has been fluctuating between 135/85 and 155/85. Her blood pressure was 142/85 earlier today. She previously experienced lower blood pressure readings in the 'one teens' a month ago.  She is currently taking gabapentin  300 mg three times a day, which is not sedating. She experiences increased pain in the evening. She has a history of using diclofenac  for hip pain but is hesitant to use NSAIDs due to concerns about blood pressure and kidney function. She is also taking Tylenol  to help manage her pain.  She is scheduled to see a specialist on July 29th for further evaluation of her radicular pain.      Observations/Objective: General-no acute distress, pleasant,  oriented. Lungs- on inspection lungs appear unlabored. Neck- no tracheal deviation or jvd on inspection. Neuro- gross motor function appears intact.   Assessment and Plan: Assessment and Plan           Radicular pain from cervical spine Radicular pain with numbness and muscle pain, relieved by steroid taper but recurred post-taper. Medrol  considered for short-term relief. Gabapentin  adjustment for neuropathic pain. NSAIDs avoided due to renal and hypertensive risks. - Prescribe Medrol  4 mg, 1 tablet daily for 7 days. - Increase gabapentin  to 600 mg at night, maintain 300 mg in the morning and at lunch. - Adjust treatment based on neurosurgeon's recommendations. - Avoid NSAIDs.  Hypertension Elevated blood pressure likely due to prednisone  and pain. Losartan  25 mg previously effective. Consider dose increase if hypertension persists. Avoid NSAIDs and HCTZ due to renal and electrolyte concerns. - Continue losartan  25 mg daily. - Monitor blood pressure regularly and report readings. - Consider increasing losartan  to 50 mg if blood pressure remains elevated after 7-10 days. - Order repeat metabolic panel around August 7th to assess renal function.   Follow Up Instructions:    I discussed the assessment and treatment plan with the patient. The patient was provided an opportunity to ask questions and all were answered. The patient agreed with the plan and demonstrated an understanding of the instructions.   The patient was advised to call back or seek an in-person evaluation if the symptoms worsen or if the condition fails to improve as anticipated.   Idora Brosious, PA-C

## 2024-07-16 ENCOUNTER — Ambulatory Visit: Admitting: Rehabilitation

## 2024-07-18 ENCOUNTER — Ambulatory Visit

## 2024-07-18 DIAGNOSIS — M6281 Muscle weakness (generalized): Secondary | ICD-10-CM

## 2024-07-18 DIAGNOSIS — M436 Torticollis: Secondary | ICD-10-CM

## 2024-07-18 DIAGNOSIS — M25611 Stiffness of right shoulder, not elsewhere classified: Secondary | ICD-10-CM

## 2024-07-18 DIAGNOSIS — M542 Cervicalgia: Secondary | ICD-10-CM

## 2024-07-18 DIAGNOSIS — R293 Abnormal posture: Secondary | ICD-10-CM

## 2024-07-18 NOTE — Therapy (Signed)
 OUTPATIENT PHYSICAL THERAPY SHOULDER TREATMENT   Patient Name: Ann Frederick MRN: [id number] DOB:1971-07-17, 53 y.o., female Today's Date: 07/18/2024  END OF SESSION:  PT End of Session - 07/18/24 0950     Visit Number 6    Date for PT Re-Evaluation 08/21/24    PT Start Time 0942   pt late   PT Stop Time 1036    PT Time Calculation (min) 54 min    Activity Tolerance Patient tolerated treatment well;No increased pain    Behavior During Therapy WFL for tasks assessed/performed             Past Medical History:  Diagnosis Date   Acne    Anemia    history of   Anxiety    Arthritis    Depression    GERD (gastroesophageal reflux disease)    worse while pregnant   Headache    Migraines   Heart murmur    ? heart murmur in past   History of blood transfusion 2013   Hypertension    Metabolic syndrome    Past Surgical History:  Procedure Laterality Date   ANTERIOR FUSION CERVICAL SPINE  2018   CESAREAN SECTION     x 2   CESAREAN SECTION  09/29/2012   Procedure: CESAREAN SECTION;  Surgeon: Alm JAYSON Cook, MD;  Location: [facility];  Service: Obstetrics;  Laterality: N/A;  Repeat edc 10/12/12/REQUEST;Chassity,Dee,Colleen   CHOLECYSTECTOMY N/A 02/26/2024   Procedure: LAPAROSCOPIC CHOLECYSTECTOMY  INTRAOPERATIVE CHOLANGIOGRAM;  Surgeon: Sheldon Standing, MD;  Location: [facility];  Service: General;  Laterality: N/A;   COLONOSCOPY     ESOPHAGOGASTRODUODENOSCOPY  normal    7/12   JOINT REPLACEMENT Bilateral    LAPAROSCOPIC VAGINAL HYSTERECTOMY WITH SALPINGECTOMY Bilateral 02/23/2016   Procedure: LAPAROSCOPIC ASSISTED VAGINAL HYSTERECTOMY WITH bilateral SALPINGECTOMY, right oophorectomy. laporotic repair of incidental cystotomy.;  Surgeon: Alm Cook, MD;  Location: [facility];  Service: Gynecology;  Laterality: Bilateral;   MOUTH SURGERY     Patient Active Problem List   Diagnosis Date Noted   HTN (hypertension) 06/11/2024   Obesity (BMI 30-39.9) 06/11/2024   AKI (acute kidney injury)  (HCC) 06/09/2024   Acute on chronic cholecystitis 02/25/2024   Cholecystitis 02/25/2024   Left hip pain 08/27/2019   Class 1 obesity due to excess calories without serious comorbidity with body mass index (BMI) of 34.0 to 34.9 in adult 11/28/2018   Heart palpitations 11/28/2018   OSA (obstructive sleep apnea) 09/14/2018   Neck pain 04/26/2018   Wellness examination 03/08/2016   S/P laparoscopic assisted vaginal hysterectomy (LAVH) 02/23/2016   Pink eye 06/19/2015   Pain of right thumb 06/19/2015   Sinusitis, acute frontal 02/05/2015   Headache around the eyes 02/05/2015   Back pain 11/01/2014   Routine general medical examination at a health care facility 10/30/2013   Vesicular rash 09/07/2013   Sinusitis 02/02/2013   Viral pharyngitis 03/03/2012   Flank pain 08/13/2011   Inguinal adenopathy 08/13/2011   Dysphagia 04/20/2011   GERD 01/29/2011   FATIGUE 01/29/2011   SCIATICA, BILATERAL 06/18/2010   LEG PAIN, BILATERAL 06/06/2010   HIP PAIN, LEFT 04/01/2010   MASS, LOCALIZED, SUPERFICIAL 03/05/2009   ACNE VULGARIS 07/11/2007   MURMUR 07/11/2007    PCP: Dorina Loving, PA-C   REFERRING PROVIDER: Dorina Loving, PA-C   REFERRING DIAG: M25.511 (ICD-10-CM) - Acute pain of right shoulderS46.811A (ICD-10-CM) - Strain of right trapezius muscle, initial encounter  THERAPY DIAG:  Neck pain  Muscle weakness (generalized)  Abnormal posture  Cervicalgia  Neck stiffness  Stiffness of right shoulder, not elsewhere classified  Rationale for Evaluation and Treatment: Rehabilitation  ONSET DATE: last 1-2 months   NEXT MD VISIT:  no f/u for neck pain unless needed;  PET scan 07/04/24 per oncologist SUBJECTIVE:                                                                                                                                                                                      SUBJECTIVE STATEMENT:   Pt reports her pain is better, it mostly bothers her at the end of  the day after work.  EVAL:  Patient presents to PT for R sided cervical/upper trap pain that has increased over the last 1-2 months for no apparent reason.  She denies any trauma or MOI.  She  also reports tingling sensation into the R arm into her fingers (all fingers) intermittently.  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   Reports has 3 computer monitors that she views throughout the workday.  She does not have an elevating work station that would allow her to stand and work.    States pain usually better in the AM and is worse  as the workday goes on.   States feels better with correct positioning and cross body shoulder HADD stretching.  She does not recall her HEP from therapy here a year ago, but states that it helped when she was doing it previously.   Of note, Patient was hospitalized in 3/25 for Abd pain, cholecystitis, and cholecystectomy.   She was hospitalized again 2 weeks ago for abdominal pain.  Abdominal CT demonstrated perirenal stranding and she has f/u with oncology and is scheduled for PET scan soon to r/o CA.    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 Yes: NPRS scale: 2/10 now, 8/10 worst Pain location: R UT  Pain description: aching, intermittent pain in RUT and intermittent tingling into the RUE into the fingers (all fingers).   Aggravating factors: working and sitting thru the day at computer, [employer] Relieving factors: stretching neck into sidebending  PRECAUTIONS: h/o ACDF  RED FLAGS: None   WEIGHT BEARING RESTRICTIONS: No  FALLS:  Has patient fallen in last 6 months? 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  DIAGNOSTIC FINDINGS:  N/a  PATIENT SURVEYS:  Quick Dash: 34.1%  COGNITION: Overall cognitive status: Within functional limits for tasks assessed     SENSATION: WFL  POSTURE: Forward head rounded shoulders   UPPER EXTREMITY ROM:   Active ROM Right eval Left eval  Shoulder flexion 180 p 180  Shoulder extension 55 60  Shoulder abduction 175 p 175  Shoulder adduction Reach to opposite shoulder Reach to opposite shoulder  Shoulder internal rotation To L1 To T10  Shoulder external rotation To C7 p To T2  Elbow flexion    Elbow extension    Wrist flexion    Wrist extension    Wrist ulnar deviation    Wrist radial deviation    Wrist pronation    Wrist supination    (Blank rows = not tested)  UPPER EXTREMITY MMT:  MMT Right eval Left eval  Shoulder flexion 4   p 5  Shoulder extension 4- 4+  Shoulder abduction 4 p 4+  Shoulder adduction    Shoulder internal rotation 4 4+  Shoulder external rotation 4- 4  Middle trapezius    Lower trapezius    Elbow flexion 4 5  Elbow extension 4 4  Wrist flexion    Wrist extension    Wrist ulnar deviation    Wrist radial deviation    Wrist pronation    Wrist supination    Grip strength (lbs) 10 kg 8 kg  (Blank rows = not tested)  SHOULDER SPECIAL TESTS: Impingement tests: Hawkins/Kennedy impingement test: positive  SLAP lesions: NT Instability tests: NT Rotator cuff assessment: Infraspinatus test: negative Biceps assessment: NT  NECK SPECIAL TESTS:  Spurling's is negative bilaterally.  Compression is negative, distraction feel good, but no impact on RUE symptoms  JOINT MOBILITY TESTING:  WNL for R shoulder    PALPATION:  TTP over R UT and levator with  trigger points in both                                                                                                                             TREATMENT DATE:  07/18/24 UBE L1.0 x 6 min Standing rows RTB x 30- muscles more shaky today Standing shoulder ext RTB x 30 Standing horiz ABD RTB anchored to door x 15 Standing B ER RTB x 20 Standing chin tuck x 10 Standing shoulder flex and scap x 1lb each w/ chin tuck Wall angels x 10  Open books standing x 10 B Cervical traction 25lb max pull, 60/20 cycles, x 10 min  07/12/24  THERAPEUTIC EXERCISE: To improve strength.  Demonstration, verbal and tactile cues throughout for technique. UBE L2 x 6' Back  Rowing GTB x 30 BUE Rowing GTB x 30 RUE only Foam roll  Shoulder ER RTB x 30 BUE Foam roll shoulder HABD RTB x 30 BUE Foam roll scapular depression Prone HABD x 20 BUE Prone LT raises x 20 alternating BUE L Cervical SB stretch x 1' x 2  POE w/ cervical retraction x 20 Prone grade 3-4 upper thoracic P/A mobilization to T1-T4 x 10-20 ea level  Combo US /ESU to L upper trap/levator Tp:   100% US  x 1.5 W/cm2 with Hi-Volt ESU machine default settings x 120 mV x 8'  Intermittent Cervical Traction 60 sec hold/20 sec release @ 22 lb pull x 15'  07/09/24 THERAPEUTIC EXERCISE: To improve strength and endurance.  Demonstration, verbal and tactile cues throughout for technique. UBE L2 x 5' back THERAPEUTIC ACTIVITIES: To improve functional performance.  Demonstration, verbal and tactile cues throughout for technique. B shoulder extension RTB x 20 B rowing Blue TB x 20 Doorway B shoulder ER RTB x 20 Doorway B shoulder HABD RTB x 20 Corner pec stretch x 1' x 2 Foam roll lying palms up x 2' Foam roll with alternate shoulder flexion x 20 BUE Foam roll w/ scapular depression x 20 BUE Seated L cervical SB stretch x 1' x 2   Combo US /ESU to L upper trap/levator Tp:   100% US  x 1.5 W/cm2 with Hi-Volt ESU machine default settings x 120 mV x  10'  07/05/24 Saunders cervical traction x at 12 lb pull x 10' for radicular-like symptoms  MFR, subocciptal release, deep pressure Tp release to R UT and levator Tp x 15'  Combo treatment to L UT/levator area:  100% US  x 1.5 w/cm2 with Hi-Volt estim at 110 mV x 10'  Supine BUE shoulder ER RTB x 20 Supine BUE shoulder HABD RTB x 20 Supine towel cervical traction self assisted--needs teaching from PT for correct angle and correct pull Supine nerve flossing x 20 for RUE median n. Standing nerve flossing x 20 for R median n. Doorway pec/thoracic stretch x 1' x 2 BUE Seated cervical SB stretch x 1' x 2 BUE UBE L1 x 5' backward  07/03/24:  Supine for manual stretching R upper trpas and levator, with outs of manual cervical distraction  Trigger Point Dry Needling  Initial Treatment: Pt instructed on Dry Needling rational, procedures, and possible side effects. Pt instructed to expect mild to moderate muscle soreness later in the day and/or into the next day.  Pt instructed in methods to reduce muscle soreness. Pt instructed to continue prescribed HEP. Because Dry Needling was performed over or adjacent to a lung field, pt was educated on S/S of pneumothorax and to seek immediate medical attention should they occur.  Patient was educated on signs and symptoms of infection and other risk factors and advised to seek medical attention should they occur.  Patient verbalized understanding of these instructions and education.   Patient Verbal Consent Given: Yes Education Handout Provided: Previously Provided Muscles Treated: r upper traps, R C 6 paraspinals, performed by Amy Speaks, PT, R levator, R rhomboid, R subscap, R middle traps  performed by Elijah Brigitte ALMETA Ivin Iona Performed: No Treatment Response/Outcome: twitch repsonse, decreased muscular resistance   Reviewed home ex program removed the cervical extension, instructed in adapting R levator stretch with self occipital  distraction L hand,\ Added B shoulder Er formiddle traps engagement with yellow t band   06/25/24    PATIENT EDUCATION:  Education details: PT eval findings, anticipated POC, progress with PT, ongoing PT POC, initial HEP, postural awareness, posture and body mechanics for typical  daily postioning, mobility and household tasks, self-STM techniques to R UT and levator using massage wand, role of TPDN, TPDN rational, procedure, outcomes, potential side effects, and recommended post-treatment exercises/activity, and fee schedule for TPDN and lack of insurance coverage requiring payment at time of service  Person educated: Patient Education method: Explanation, Demonstration, Verbal cues, Tactile cues, and [email or web address] app access provided Education comprehension: verbal cues required, tactile cues required, and needs further education   HOME EXERCISE PROGRAM: Access Code: [email or web address] URL: [email or web address] Date: 06/25/2024 Prepared by: Garnette Montclair  Exercises - Seated Upper Trapezius Stretch  - 1 x daily - 7 x weekly - 2 sets - 2 reps - 1 min hold - Seated Levator Scapulae Stretch  - 1 x daily - 7 x weekly - 2 sets - 1 reps - 1 min hold - Seated Cervical Extension AROM  - 3 x daily - 1 x weekly - 1 sets - 10 reps - 5 hold - Seated Thoracic Lumbar Extension with Pectoralis Stretch  - 3 x daily - 7 x weekly - 1 sets - 10 reps - 5 sec hold  ASSESSMENT:  CLINICAL IMPRESSION: Pt with good response to treatment. Emphasized periscapular strength with interventions. Incorporated more thoracic mobility exercises as well. Concluded session with traction and increased force per patient request to reduce N/T in R UE.    07/03/24: first Rx session following eval for combined manual techniques, TDN, and stretching.  Tolerated well, no change during session with radicular Sx.  Will continue to assess and progress as appropriate.   Eval:Patient is a 53 y.o. female referred  for physical  therapy evaluation and treatment for R neck pain and R trapezius strain by PCP.  She demonstrates RUE weakness and stiffness throughout the R neck, shoulder, and elbow with postural deficits, trigger points in the R UT and levator with pain and also tingling intermittently into the RUE.   Patient is cleared for PT and would benefit from PT to address the above deficits.   She has good rehab potential to decreased her pain and improve her cervical ROM, shoulder/scapular strength and stability and be able to work painfree.   She has had PT here in past for this problem with good relief and is highly motivated to improve her condition.    OBJECTIVE IMPAIRMENTS: decreased knowledge of condition, decreased ROM, decreased strength, increased fascial restrictions, increased muscle spasms, impaired flexibility, impaired sensation, impaired UE functional use, improper body mechanics, postural dysfunction, and pain.   ACTIVITY LIMITATIONS: lifting, sitting, reach over head, and hygiene/grooming  PARTICIPATION LIMITATIONS: cleaning, laundry, and occupation  PERSONAL FACTORS: Profession and 1-2 comorbidities: ACDF, OA, migraines, HTN  are also affecting patient's functional outcome.   REHAB POTENTIAL: Good  CLINICAL DECISION MAKING: Evolving/moderate complexity  EVALUATION COMPLEXITY: Moderate   GOALS: Goals reviewed with patient? Yes  SHORT TERM GOALS: Target date: 07/24/24  Patient will be independent with initial HEP.  Baseline: 100% PT assistance required for correct completion  Goal status: INITIAL  2.  Patient will report 50% subjective improvement in pain as indicated by 4/10 worst pain in the neck/R UT   Baseline: 8/10 worst  Goal status: MET- 07/18/24    LONG TERM GOALS: Target date: 08/21/24   Patient will be independent with advanced/ongoing HEP to improve outcomes and carryover.  Baseline: 100% PT assistance required for initial HEP, nothing advanced yet Goal status: INITIAL  2.   Patient will report 100% improvement in R shoulder pain to improve QOL.  Baseline:  8/10 worst pain  Goal status: INITIAL  3.  Patient to improve R shoulder AROM to [facility] without pain provocation to allow for increased ease of ADLs.  Baseline: 55 degrees R shoulder ER  Goal status: INITIAL  4.  Patient will demonstrate improved functional UE strength as demonstrated by 5/5 R shoulder strength for ability to do haircare and household and recreational activities without needing rest . Baseline: unable to complete without stopping for rest Goal status: INITIAL  5.  Patient will report 10 points improvement on QuickDash to demonstrate improved functional ability.  Baseline: 34.1 Goal status: INITIAL   PLAN:  PT FREQUENCY: 2x/week  PT DURATION: 8 weeks  PLANNED INTERVENTIONS: 97164- PT Re-evaluation, 97750- Physical Performance Testing, 97110-Therapeutic exercises, 97530- Therapeutic activity, 97112- Neuromuscular re-education, 97535- Self Care, [id number]- Manual therapy, [id number]- Electrical stimulation (unattended), 97016- Vasopneumatic device, 97035- Ultrasound, [id number]- Ionotophoresis 4mg /ml Dexamethasone , Patient/Family education, Taping, Joint mobilization, Spinal mobilization, DME instructions, Cryotherapy, Moist heat, and Biofeedback  PLAN FOR NEXT SESSION: continue postural strengthening and thoracic mobilization; traction for N/T    Arihana Ambrocio L Keishawn Rajewski, PTA 07/18/2024, 10:30 AM

## 2024-07-19 ENCOUNTER — Encounter: Payer: Self-pay | Admitting: Medical

## 2024-07-19 ENCOUNTER — Other Ambulatory Visit: Payer: Self-pay | Admitting: Family

## 2024-07-19 DIAGNOSIS — M5412 Radiculopathy, cervical region: Secondary | ICD-10-CM

## 2024-07-19 MED ORDER — GABAPENTIN 300 MG PO CAPS
ORAL_CAPSULE | ORAL | 0 refills | Status: DC
Start: 2024-07-19 — End: 2024-07-20

## 2024-07-20 ENCOUNTER — Encounter: Payer: Self-pay | Admitting: Medical

## 2024-07-20 ENCOUNTER — Telehealth: Admitting: Physician Assistant

## 2024-07-20 DIAGNOSIS — M5412 Radiculopathy, cervical region: Secondary | ICD-10-CM

## 2024-07-20 MED ORDER — GABAPENTIN 300 MG PO CAPS: ORAL_CAPSULE | ORAL | 2 refills | Status: DC

## 2024-07-20 NOTE — Patient Instructions (Signed)
 Ann Frederick, thank you for joining Delon CHRISTELLA Dickinson, PA-C for today's virtual visit.  While this provider is not your primary care provider (PCP), if your PCP is located in our provider database this encounter information will be shared with them immediately following your visit.   A [facility] MyChart account gives you access to today's visit and all your visits, tests, and labs performed at [facility]  click here if you don't have a [facility] MyChart account or go to [email or web address]  Consent: (Patient) Ann Frederick provided verbal consent for this virtual visit at the beginning of the encounter.  Current Medications:  Current Outpatient Medications:    amLODipine  (NORVASC ) 5 MG tablet, Take 1 tablet (5 mg total) by mouth daily., Disp: 30 tablet, Rfl: 0   aspirin  EC 81 MG tablet, Take 81 mg by mouth in the morning. Swallow whole., Disp: , Rfl:    buPROPion  (WELLBUTRIN  XL) 300 MG 24 hr tablet, Take 300 mg by mouth every morning., Disp: , Rfl:    busPIRone  (BUSPAR ) 15 MG tablet, TAKE 1 TABLET BY MOUTH TWICE A DAY, Disp: 180 tablet, Rfl: 2   Cholecalciferol (VITAMIN D3) 1000 units CAPS, Take 1,000 Units by mouth daily., Disp: , Rfl:    cyclobenzaprine  (FLEXERIL ) 10 MG tablet, 1 tab po q hs prn neck pain, Disp: 7 tablet, Rfl: 1   estradiol (ESTRACE) 2 MG tablet, Take 2 mg by mouth daily., Disp: , Rfl:    ezetimibe  (ZETIA ) 10 MG tablet, Take 1 tablet (10 mg total) by mouth daily., Disp: 90 tablet, Rfl: 3   gabapentin  (NEURONTIN ) 300 MG capsule, Take 1 capsule twice daily, then take 2 capsules at bedtime, Disp: 120 capsule, Rfl: 2   [Paused] losartan  (COZAAR ) 25 MG tablet, Take 25 mg by mouth daily. (Patient not taking: Reported on 06/20/2024), Disp: , Rfl:    LYSINE PO, Take 1 capsule by mouth daily., Disp: , Rfl:    ondansetron  (ZOFRAN -ODT) 4 MG disintegrating tablet, Take 1 tablet (4 mg total) by mouth every 8 (eight) hours as needed., Disp: 8 tablet, Rfl: 0    Prenatal Vit-Fe Fumarate-FA (MULTIVITAMIN-PRENATAL) 27-0.8 MG TABS tablet, Take 1 tablet by mouth daily at 12 noon., Disp: , Rfl:    prochlorperazine  (COMPAZINE ) 5 MG tablet, Take 1-2 tablets (5-10 mg total) by mouth every 6 (six) hours as needed for nausea, vomiting or refractory nausea / vomiting., Disp: 15 tablet, Rfl: 2   Semaglutide -Weight Management (WEGOVY ) 0.25 MG/0.5ML SOAJ, One injection 0.25 mg weekly, Disp: 2 mL, Rfl: 0   UNABLE TO FIND, Med Name: KETAMINE  250MG  ODT RASPBERRY, Disp: , Rfl:    valACYclovir  (VALTREX ) 1000 MG tablet, Take 2 tablets (2,000 mg total) by mouth 2 (two) times daily. (Patient taking differently: Take 2,000 mg by mouth 2 (two) times daily. BID prn), Disp: 4 tablet, Rfl: 0   Zinc  50 MG TABS, Take 50 mg by mouth daily., Disp: , Rfl:    zolpidem  (AMBIEN ) 10 MG tablet, Take 5-10 mg by mouth at bedtime as needed for sleep., Disp: , Rfl:    Medications ordered in this encounter:  Meds ordered this encounter  Medications   gabapentin  (NEURONTIN ) 300 MG capsule    Sig: Take 1 capsule twice daily, then take 2 capsules at bedtime    Dispense:  120 capsule    Refill:  2    Supervising Provider:   BLAISE ALEENE MALVA [[id number]]     *If you need refills on other medications prior  to your next appointment, please contact your pharmacy*  Follow-Up: Call back or seek an in-person evaluation if the symptoms worsen or if the condition fails to improve as anticipated.  [facility] [phone]  Other Instructions Cervical Radiculopathy  Cervical radiculopathy happens when a nerve in the neck (a cervical nerve) is pinched or bruised. This condition can happen because of an injury to the cervical spine (vertebrae) in the neck, or as part of the normal aging process. Pressure on the cervical nerves can cause pain or numbness that travels from the neck all the way down to the arm and fingers. This condition usually gets better with rest. Treatment may be needed if  the condition does not improve. What are the causes? This condition may be caused by: A neck injury. A bulging (herniated) disk. Muscle spasms. Muscle tightness in the neck due to overuse. Arthritis. Breakdown or degeneration in the bones and joints of the spine (spondylosis) due to aging. Bone spurs that may develop near the cervical nerves. What are the signs or symptoms? Symptoms of this condition include: Pain. The pain may travel from the neck to the arm and hand. The pain can be severe or irritating. It may get worse when you move your neck. Numbness or tingling in your arm or hand. Weakness in the affected arm and hand, in severe cases. How is this diagnosed? This condition may be diagnosed based on your symptoms, your medical history, and a physical exam. You may also have tests, including: X-rays. CT scan. MRI. Electromyogram (EMG). Nerve conduction tests. How is this treated? In many cases, treatment is not needed for this condition. With rest, the condition usually gets better over time. If treatment is needed, options may include: Wearing a soft neck collar (cervical collar) for short periods of time. Doing physical therapy to strengthen your neck muscles. Taking medicines. These may include NSAIDs, such as ibuprofen , or oral corticosteroids. Having spinal injections, in severe cases. Having surgery. This may be needed if other treatments do not help. Different types of surgery may be done depending on the cause of this condition. Follow these instructions at home: If you have a cervical collar: Wear it as told by your health care provider. Remove it only as told by your health care provider. Ask your health care provider if you can remove the cervical collar for cleaning and bathing. If you are allowed to remove the collar for cleaning or bathing: Follow instructions from your health care provider about how to remove the collar safely. Clean the collar by wiping it with  mild soap and water  and drying it completely. Take out any removable pads in the collar every 1-2 days, and wash them by hand with soap and water . Let them air-dry completely before you put them back in the collar. Check your skin under the collar for irritation or sores. If you see any, tell your health care provider. Managing pain     Take over-the-counter and prescription medicines only as told by your health care provider. If directed, put ice on the affected area. To do this: If you have a soft neck collar, remove it as told by your health care provider. Put ice in a plastic bag. Place a towel between your skin and the bag. Leave the ice on for 20 minutes, 2-3 times a day. Remove the ice if your skin turns bright red. This is very important. If you cannot feel pain, heat, or cold, you have a greater  risk of damage to the area. If applying ice does not help, you can try using heat. Use the heat source that your health care provider recommends, such as a moist heat pack or a heating pad. Place a towel between your skin and the heat source. Leave the heat on for 20-30 minutes. Remove the heat if your skin turns bright red. This is especially important if you are unable to feel pain, heat, or cold. You have a greater risk of getting burned. Try a gentle neck and shoulder massage to help relieve symptoms. Activity Rest as needed. Return to your normal activities as told by your health care provider. Ask your health care provider what activities are safe for you. Do stretching and strengthening exercises as told by your health care provider or your physical therapist. You may have to avoid lifting. Ask your health care provider how much you can safely lift. General instructions Use a flat pillow when you sleep. Do not drive while wearing a cervical collar. If you do not have a cervical collar, ask your health care provider if it is safe to drive while your neck heals. Ask your health care  provider if the medicine prescribed to you requires you to avoid driving or using machinery. Do not use any products that contain nicotine or tobacco. These products include cigarettes, chewing tobacco, and vaping devices, such as e-cigarettes. If you need help quitting, ask your health care provider. Keep all follow-up visits. This is important. Contact a health care provider if: Your condition does not improve with treatment. Get help right away if: Your pain gets much worse and is not controlled with medicines. You have weakness or numbness in your hand, arm, face, or leg. You have a high fever. You have a stiff, rigid neck. You lose control of your bowels or your bladder (have incontinence). You have trouble with walking, balance, or speaking. Summary Cervical radiculopathy happens when a nerve in the neck is pinched or bruised. A nerve can get pinched from a bulging disk, arthritis, muscle spasms, or an injury to the neck. Symptoms include pain, tingling, or numbness radiating from the neck to the arm or hand. Weakness can also occur in severe cases. Treatment may include rest, wearing a cervical collar, and physical therapy. Medicines may be prescribed to help with pain. In severe cases, injections or surgery may be needed. This information is not intended to replace advice given to you by your health care provider. Make sure you discuss any questions you have with your health care provider. Document Revised: 06/18/2021 Document Reviewed: 06/18/2021 Elsevier Patient Education  2024 Elsevier [employer].   If you have been instructed to have an in-person evaluation today at a local Urgent Care facility, please use the link below. It will take you to a list of all of our available [facility], including address, phone number and hours of operation. Please do not delay care.  [facility]  If you or a family member do not have a primary care provider, use the link below  to schedule a visit and establish care. When you choose a [facility] primary care physician or advanced practice provider, you gain a long-term partner in health. Find a Primary Care Provider  Learn more about [facility]'s in-office and virtual care options: [facility] - Get Care Now

## 2024-07-20 NOTE — Progress Notes (Signed)
 Virtual Visit Consent   Ann Frederick, you are scheduled for a virtual visit with a [facility] provider today. Just as with appointments in the office, your consent must be obtained to participate. Your consent will be active for this visit and any virtual visit you may have with one of our providers in the next 365 days. If you have a MyChart account, a copy of this consent can be sent to you electronically.  As this is a virtual visit, video technology does not allow for your provider to perform a traditional examination. This may limit your provider's ability to fully assess your condition. If your provider identifies any concerns that need to be evaluated in person or the need to arrange testing (such as labs, EKG, etc.), we will make arrangements to do so. Although advances in technology are sophisticated, we cannot ensure that it will always work on either your end or our end. If the connection with a video visit is poor, the visit may have to be switched to a telephone visit. With either a video or telephone visit, we are not always able to ensure that we have a secure connection.  By engaging in this virtual visit, you consent to the provision of healthcare and authorize for your insurance to be billed (if applicable) for the services provided during this visit. Depending on your insurance coverage, you may receive a charge related to this service.  I need to obtain your verbal consent now. Are you willing to proceed with your visit today? Ann Frederick has provided verbal consent on 07/20/2024 for a virtual visit (video or telephone). Delon CHRISTELLA Dickinson, PA-C  Date: 07/20/2024 1:59 PM   Virtual Visit via Video Note   I, Delon CHRISTELLA Dickinson, connected with  Ann Frederick  ([id number], 1971/03/15) on 07/20/24 at  1:45 PM EDT by a video-enabled telemedicine application and verified that I am speaking with the correct person using two identifiers.  Location: Patient: Virtual Visit Location  Patient: Home Provider: Virtual Visit Location Provider: Home Office   I discussed the limitations of evaluation and management by telemedicine and the availability of in person appointments. The patient expressed understanding and agreed to proceed.    History of Present Illness: Ann Frederick is a 53 y.o. who identifies as a female who was assigned female at birth, and is being seen today for medication update and refill of her Gabapentin . She has been seen by her PCP for this and had been originally placed on Gabapentin  300mg  capsule start with one daily then increase to twice daily after 3 days.   She then followed up on 07/06/24 and Gabapentin  was increased to three times daily and she was also given a Prednisone  taper pack.   She was then seen again on 07/13/24 virtually for the same. At that point she was given a Medrol  taper pack and Gabapentin  was increased to 300mg  in the morning and lunch, and 600mg  at bedtime. She has been advised to avoid NSAIDs due to recent decreased GFR.  She does have an appt with Neurosurgery on 07/24/24.  She had asked for a refill but the old prescription dose of only twice daily was prescribed, not the updated dose from 07/13/24. Due to this she has run out of her prescription early.   BP: 123/90 HR 92   Problems:  Patient Active Problem List   Diagnosis Date Noted   HTN (hypertension) 06/11/2024   Obesity (BMI 30-39.9) 06/11/2024   AKI (acute  kidney injury) (HCC) 06/09/2024   Acute on chronic cholecystitis 02/25/2024   Cholecystitis 02/25/2024   Left hip pain 08/27/2019   Class 1 obesity due to excess calories without serious comorbidity with body mass index (BMI) of 34.0 to 34.9 in adult 11/28/2018   Heart palpitations 11/28/2018   OSA (obstructive sleep apnea) 09/14/2018   Neck pain 04/26/2018   Wellness examination 03/08/2016   S/P laparoscopic assisted vaginal hysterectomy (LAVH) 02/23/2016   Pink eye 06/19/2015   Pain of right thumb  06/19/2015   Sinusitis, acute frontal 02/05/2015   Headache around the eyes 02/05/2015   Back pain 11/01/2014   Routine general medical examination at a health care facility 10/30/2013   Vesicular rash 09/07/2013   Sinusitis 02/02/2013   Viral pharyngitis 03/03/2012   Flank pain 08/13/2011   Inguinal adenopathy 08/13/2011   Dysphagia 04/20/2011   GERD 01/29/2011   FATIGUE 01/29/2011   SCIATICA, BILATERAL 06/18/2010   LEG PAIN, BILATERAL 06/06/2010   HIP PAIN, LEFT 04/01/2010   MASS, LOCALIZED, SUPERFICIAL 03/05/2009   ACNE VULGARIS 07/11/2007   MURMUR 07/11/2007    Allergies:  Allergies  Allergen Reactions   Azithromycin  Diarrhea and Nausea And Vomiting   Dilaudid  [Hydromorphone ] Nausea Only   Medications:  Current Outpatient Medications:    amLODipine  (NORVASC ) 5 MG tablet, Take 1 tablet (5 mg total) by mouth daily., Disp: 30 tablet, Rfl: 0   aspirin  EC 81 MG tablet, Take 81 mg by mouth in the morning. Swallow whole., Disp: , Rfl:    buPROPion  (WELLBUTRIN  XL) 300 MG 24 hr tablet, Take 300 mg by mouth every morning., Disp: , Rfl:    busPIRone  (BUSPAR ) 15 MG tablet, TAKE 1 TABLET BY MOUTH TWICE A DAY, Disp: 180 tablet, Rfl: 2   Cholecalciferol (VITAMIN D3) 1000 units CAPS, Take 1,000 Units by mouth daily., Disp: , Rfl:    cyclobenzaprine  (FLEXERIL ) 10 MG tablet, 1 tab po q hs prn neck pain, Disp: 7 tablet, Rfl: 1   estradiol (ESTRACE) 2 MG tablet, Take 2 mg by mouth daily., Disp: , Rfl:    ezetimibe  (ZETIA ) 10 MG tablet, Take 1 tablet (10 mg total) by mouth daily., Disp: 90 tablet, Rfl: 3   gabapentin  (NEURONTIN ) 300 MG capsule, Take 1 capsule twice daily, then take 2 capsules at bedtime, Disp: 120 capsule, Rfl: 2   [Paused] losartan  (COZAAR ) 25 MG tablet, Take 25 mg by mouth daily. (Patient not taking: Reported on 06/20/2024), Disp: , Rfl:    LYSINE PO, Take 1 capsule by mouth daily., Disp: , Rfl:    ondansetron  (ZOFRAN -ODT) 4 MG disintegrating tablet, Take 1 tablet (4 mg total)  by mouth every 8 (eight) hours as needed., Disp: 8 tablet, Rfl: 0   Prenatal Vit-Fe Fumarate-FA (MULTIVITAMIN-PRENATAL) 27-0.8 MG TABS tablet, Take 1 tablet by mouth daily at 12 noon., Disp: , Rfl:    prochlorperazine  (COMPAZINE ) 5 MG tablet, Take 1-2 tablets (5-10 mg total) by mouth every 6 (six) hours as needed for nausea, vomiting or refractory nausea / vomiting., Disp: 15 tablet, Rfl: 2   Semaglutide -Weight Management (WEGOVY ) 0.25 MG/0.5ML SOAJ, One injection 0.25 mg weekly, Disp: 2 mL, Rfl: 0   UNABLE TO FIND, Med Name: KETAMINE  250MG  ODT RASPBERRY, Disp: , Rfl:    valACYclovir  (VALTREX ) 1000 MG tablet, Take 2 tablets (2,000 mg total) by mouth 2 (two) times daily. (Patient taking differently: Take 2,000 mg by mouth 2 (two) times daily. BID prn), Disp: 4 tablet, Rfl: 0   Zinc  50 MG TABS,  Take 50 mg by mouth daily., Disp: , Rfl:    zolpidem  (AMBIEN ) 10 MG tablet, Take 5-10 mg by mouth at bedtime as needed for sleep., Disp: , Rfl:   Observations/Objective: Patient is well-developed, well-nourished in no acute distress.  Resting comfortably at home.  Head is normocephalic, atraumatic.  No labored breathing.  Speech is clear and coherent with logical content.  Patient is alert and oriented at baseline.    Assessment and Plan: 1. Cervical radiculopathy - gabapentin  (NEURONTIN ) 300 MG capsule; Take 1 capsule twice daily, then take 2 capsules at bedtime  Dispense: 120 capsule; Refill: 2  - Gabapentin  dose updated and provided to patient at 90 day supply - Keep scheduled appt with Neurosurgery - Follow up with PCP for any future medication refills  Follow Up Instructions: I discussed the assessment and treatment plan with the patient. The patient was provided an opportunity to ask questions and all were answered. The patient agreed with the plan and demonstrated an understanding of the instructions.  A copy of instructions were sent to the patient via MyChart unless otherwise noted below.     The patient was advised to call back or seek an in-person evaluation if the symptoms worsen or if the condition fails to improve as anticipated.    Delon CHRISTELLA Dickinson, PA-C

## 2024-07-23 ENCOUNTER — Encounter: Admitting: Rehabilitation

## 2024-07-23 ENCOUNTER — Encounter: Payer: Self-pay | Admitting: Medical

## 2024-07-23 ENCOUNTER — Ambulatory Visit: Admitting: Medical

## 2024-07-24 DIAGNOSIS — G629 Polyneuropathy, unspecified: Secondary | ICD-10-CM | POA: Diagnosis not present

## 2024-07-24 DIAGNOSIS — M542 Cervicalgia: Secondary | ICD-10-CM | POA: Diagnosis not present

## 2024-07-25 ENCOUNTER — Ambulatory Visit: Admitting: Rehabilitation

## 2024-07-26 ENCOUNTER — Other Ambulatory Visit: Payer: Self-pay

## 2024-07-26 ENCOUNTER — Ambulatory Visit

## 2024-07-26 DIAGNOSIS — M6281 Muscle weakness (generalized): Secondary | ICD-10-CM

## 2024-07-26 DIAGNOSIS — M542 Cervicalgia: Secondary | ICD-10-CM

## 2024-07-26 DIAGNOSIS — M436 Torticollis: Secondary | ICD-10-CM

## 2024-07-26 DIAGNOSIS — R293 Abnormal posture: Secondary | ICD-10-CM

## 2024-07-26 DIAGNOSIS — M25611 Stiffness of right shoulder, not elsewhere classified: Secondary | ICD-10-CM

## 2024-07-26 NOTE — Therapy (Signed)
 OUTPATIENT PHYSICAL THERAPY SHOULDER TREATMENT   Patient Name: Ann Frederick MRN: [id number] DOB:1971-05-07, 53 y.o., female Today's Date: 07/26/2024  END OF SESSION:  PT End of Session - 07/26/24 1421     Progress Note Due on Visit 10    PT Start Time 0805    PT Stop Time 0852    PT Time Calculation (min) 47 min              Past Medical History:  Diagnosis Date   Acne    Anemia    history of   Anxiety    Arthritis    Depression    GERD (gastroesophageal reflux disease)    worse while pregnant   Headache    Migraines   Heart murmur    ? heart murmur in past   History of blood transfusion 2013   Hypertension    Metabolic syndrome    Past Surgical History:  Procedure Laterality Date   ANTERIOR FUSION CERVICAL SPINE  2018   CESAREAN SECTION     x 2   CESAREAN SECTION  09/29/2012   Procedure: CESAREAN SECTION;  Surgeon: Alm JAYSON Cook, MD;  Location: [facility];  Service: Obstetrics;  Laterality: N/A;  Repeat edc 10/12/12/REQUEST;Chassity,Dee,Colleen   CHOLECYSTECTOMY N/A 02/26/2024   Procedure: LAPAROSCOPIC CHOLECYSTECTOMY  INTRAOPERATIVE CHOLANGIOGRAM;  Surgeon: Sheldon Standing, MD;  Location: [facility];  Service: General;  Laterality: N/A;   COLONOSCOPY     ESOPHAGOGASTRODUODENOSCOPY  normal    7/12   JOINT REPLACEMENT Bilateral    LAPAROSCOPIC VAGINAL HYSTERECTOMY WITH SALPINGECTOMY Bilateral 02/23/2016   Procedure: LAPAROSCOPIC ASSISTED VAGINAL HYSTERECTOMY WITH bilateral SALPINGECTOMY, right oophorectomy. laporotic repair of incidental cystotomy.;  Surgeon: Alm Cook, MD;  Location: [facility];  Service: Gynecology;  Laterality: Bilateral;   MOUTH SURGERY     Patient Active Problem List   Diagnosis Date Noted   HTN (hypertension) 06/11/2024   Obesity (BMI 30-39.9) 06/11/2024   AKI (acute kidney injury) (HCC) 06/09/2024   Acute on chronic cholecystitis 02/25/2024   Cholecystitis 02/25/2024   Left hip pain 08/27/2019   Class 1 obesity due to excess calories without  serious comorbidity with body mass index (BMI) of 34.0 to 34.9 in adult 11/28/2018   Heart palpitations 11/28/2018   OSA (obstructive sleep apnea) 09/14/2018   Neck pain 04/26/2018   Wellness examination 03/08/2016   S/P laparoscopic assisted vaginal hysterectomy (LAVH) 02/23/2016   Pink eye 06/19/2015   Pain of right thumb 06/19/2015   Sinusitis, acute frontal 02/05/2015   Headache around the eyes 02/05/2015   Back pain 11/01/2014   Routine general medical examination at a health care facility 10/30/2013   Vesicular rash 09/07/2013   Sinusitis 02/02/2013   Viral pharyngitis 03/03/2012   Flank pain 08/13/2011   Inguinal adenopathy 08/13/2011   Dysphagia 04/20/2011   GERD 01/29/2011   FATIGUE 01/29/2011   SCIATICA, BILATERAL 06/18/2010   LEG PAIN, BILATERAL 06/06/2010   HIP PAIN, LEFT 04/01/2010   MASS, LOCALIZED, SUPERFICIAL 03/05/2009   ACNE VULGARIS 07/11/2007   MURMUR 07/11/2007    PCP: Dorina Loving, PA-C   REFERRING PROVIDER: Dorina Loving, PA-C   REFERRING DIAG: M25.511 (ICD-10-CM) - Acute pain of right shoulderS46.811A (ICD-10-CM) - Strain of right trapezius muscle, initial encounter  THERAPY DIAG:  Neck pain  Muscle weakness (generalized)  Abnormal posture  Cervicalgia  Neck stiffness  Stiffness of right shoulder, not elsewhere classified  Rationale for Evaluation and Treatment: Rehabilitation  ONSET DATE: last 1-2 months   NEXT MD  VISIT:  no f/u for neck pain unless needed;  PET scan 07/04/24 per oncologist SUBJECTIVE:                                                                                                                                                                                      SUBJECTIVE STATEMENT:   Pt reports her pain is better, neurologist thinks she has peripheral neuropathy to have nerve conduction studies. [name]  EVAL:  Patient presents to PT for R sided cervical/upper trap pain that has increased over the last 1-2 months for  no apparent reason.  She denies any trauma or MOI.  She  also reports tingling sensation into the R arm into her fingers (all fingers) intermittently.  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   Reports has 3 computer monitors that she views throughout the workday.  She does not have an elevating work station that would allow her to stand and work.    States pain usually better in the AM and is worse  as the workday goes on.   States feels better with correct positioning and cross body shoulder HADD stretching.  She does not recall her HEP from therapy here a year ago, but states that it helped when she was doing it previously.   Of note, Patient was hospitalized in 3/25 for Abd pain, cholecystitis, and cholecystectomy.   She was hospitalized again 2 weeks ago for abdominal pain.  Abdominal CT demonstrated perirenal stranding and she has f/u with oncology and is scheduled for PET scan soon to r/o CA.    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 Yes: NPRS scale: 2/10 now, 8/10 worst Pain location: R UT  Pain description: aching, intermittent pain in RUT and intermittent tingling into the RUE into the fingers (all fingers).   Aggravating factors: working and sitting thru the day at computer, [employer] Relieving factors: stretching neck into sidebending  PRECAUTIONS: h/o ACDF  RED FLAGS: None   WEIGHT BEARING RESTRICTIONS: No  FALLS:  Has patient fallen in last 6 months? 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  DIAGNOSTIC FINDINGS:  N/a  PATIENT SURVEYS:  Quick Dash: 34.1%  COGNITION: Overall cognitive status: Within functional limits for tasks assessed     SENSATION: WFL  POSTURE: Forward head rounded shoulders   UPPER EXTREMITY ROM:   Active ROM Right eval Left eval  Shoulder flexion 180 p 180  Shoulder extension 55 60  Shoulder abduction 175 p 175  Shoulder adduction Reach to opposite shoulder Reach to opposite shoulder  Shoulder internal rotation To L1 To T10  Shoulder external rotation To C7 p To T2  Elbow flexion    Elbow extension    Wrist flexion    Wrist extension    Wrist ulnar deviation    Wrist radial deviation    Wrist pronation    Wrist supination    (Blank rows = not tested)  UPPER EXTREMITY MMT:  MMT Right eval Left eval  Shoulder flexion 4   p 5  Shoulder extension 4- 4+  Shoulder abduction 4 p 4+  Shoulder adduction    Shoulder internal rotation 4 4+  Shoulder external rotation 4- 4  Middle trapezius    Lower trapezius    Elbow flexion 4 5  Elbow extension 4 4  Wrist flexion    Wrist extension    Wrist ulnar deviation    Wrist radial deviation    Wrist pronation    Wrist supination    Grip strength (lbs) 10 kg 8 kg  (Blank rows = not tested)  SHOULDER SPECIAL TESTS: Impingement tests: Hawkins/Kennedy impingement test: positive  SLAP lesions: NT Instability tests: NT Rotator cuff assessment: Infraspinatus test: negative Biceps assessment: NT  NECK SPECIAL TESTS:  Spurling's is negative bilaterally.  Compression is negative, distraction feel good, but no impact on RUE symptoms  JOINT MOBILITY TESTING:  WNL for R shoulder    PALPATION:  TTP over R UT and levator with trigger points in both                                                                                                                              TREATMENT DATE:  07/26/24:  UBE L1.0 x 6 min Therex: seated rows hands vertical 15#  x 15 Seated rows hand horizontal 15# x 15 Standing with back to wall for serving tray B UE with 1# wts 15 reps Standing scaption with 1 # wts  15 reps Standing open books 15 x each side  Combo US /ESU to L upper trap/levator Tp:   100% US  x 1.5 W/cm2 with Hi-Volt ESU machine default settings x 120 mV x 8' Cervical traction 25lb max pull, 60/20 cycles, x 10 min 07/18/24 UBE L1.0 x 6 min Standing rows RTB x 30- muscles more shaky today Standing shoulder ext RTB x 30 Standing horiz ABD RTB anchored to door x 15 Standing B ER RTB x 20 Standing chin tuck x 10 Standing shoulder flex and scap  x 1lb each w/ chin tuck Wall angels x 10  Open books standing x 10 B Cervical traction 25lb max pull, 60/20 cycles, x 10 min  07/12/24  THERAPEUTIC EXERCISE: To improve strength.  Demonstration, verbal and tactile cues throughout for technique. UBE L2 x 6' Back  Rowing GTB x 30 BUE Rowing GTB x 30 RUE only Foam roll Shoulder ER RTB x 30 BUE Foam roll shoulder HABD RTB x 30 BUE Foam roll scapular depression Prone HABD x 20 BUE Prone LT raises x 20 alternating BUE L Cervical SB stretch x 1' x 2  POE w/ cervical retraction x 20 Prone grade 3-4 upper thoracic P/A mobilization to T1-T4 x 10-20 ea level  Combo US /ESU to L upper trap/levator Tp:   100% US  x 1.5 W/cm2 with Hi-Volt ESU machine default settings x 120 mV x 8'  Intermittent Cervical Traction 60 sec hold/20 sec release @ 22 lb pull x 15'  07/09/24 THERAPEUTIC EXERCISE: To improve strength and endurance.  Demonstration, verbal and tactile cues throughout for technique. UBE L2 x 5' back THERAPEUTIC ACTIVITIES: To improve functional performance.  Demonstration, verbal and tactile cues throughout for technique. B shoulder extension RTB x 20 B rowing Blue TB x 20 Doorway B shoulder ER RTB x 20 Doorway B shoulder HABD RTB x 20 Corner  pec stretch x 1' x 2 Foam roll lying palms up x 2' Foam roll with alternate shoulder flexion x 20 BUE Foam roll w/ scapular depression x 20 BUE Seated L cervical SB stretch x 1' x 2   Combo US /ESU to L upper trap/levator Tp:   100% US  x 1.5 W/cm2 with Hi-Volt ESU machine default settings x 120 mV x 10'  07/05/24 Saunders cervical traction x at 12 lb pull x 10' for radicular-like symptoms  MFR, subocciptal release, deep pressure Tp release to R UT and levator Tp x 15'  Combo treatment to L UT/levator area:  100% US  x 1.5 w/cm2 with Hi-Volt estim at 110 mV x 10'  Supine BUE shoulder ER RTB x 20 Supine BUE shoulder HABD RTB x 20 Supine towel cervical traction self assisted--needs teaching from PT for correct angle and correct pull Supine nerve flossing x 20 for RUE median n. Standing nerve flossing x 20 for R median n. Doorway pec/thoracic stretch x 1' x 2 BUE Seated cervical SB stretch x 1' x 2 BUE UBE L1 x 5' backward  07/03/24:  Supine for manual stretching R upper trpas and levator, with outs of manual cervical distraction  Trigger Point Dry Needling  Initial Treatment: Pt instructed on Dry Needling rational, procedures, and possible side effects. Pt instructed to expect mild to moderate muscle soreness later in the day and/or into the next day.  Pt instructed in methods to reduce muscle soreness. Pt instructed to continue prescribed HEP. Because Dry Needling was performed over or adjacent to a lung field, pt was educated on S/S of pneumothorax and to seek immediate medical attention should they occur.  Patient was educated on signs and symptoms of infection and other risk factors and advised to seek medical attention should they occur.  Patient verbalized understanding of these instructions and education.   Patient Verbal Consent Given: Yes Education Handout Provided: Previously Provided Muscles Treated: r upper traps, R C 6 paraspinals, performed by Calven Gilkes, PT, R levator, R  rhomboid, R subscap, R middle traps  performed by Elijah Brigitte ALMETA Ivin Iona Performed: No Treatment Response/Outcome: twitch repsonse, decreased muscular resistance  Reviewed home ex program removed the cervical extension, instructed in adapting R levator stretch with self occipital distraction L hand,\ Added B shoulder Er formiddle traps engagement with yellow t band   06/25/24    PATIENT EDUCATION:  Education details: PT eval findings, anticipated POC, progress with PT, ongoing PT POC, initial HEP, postural awareness, posture and body mechanics for typical daily postioning, mobility and household tasks, self-STM techniques to R UT and levator using massage wand, role of TPDN, TPDN rational, procedure, outcomes, potential side effects, and recommended post-treatment exercises/activity, and fee schedule for TPDN and lack of insurance coverage requiring payment at time of service  Person educated: Patient Education method: Explanation, Demonstration, Verbal cues, Tactile cues, and [email or web address] app access provided Education comprehension: verbal cues required, tactile cues required, and needs further education   HOME EXERCISE PROGRAM: Access Code: [email or web address] URL: [email or web address] Date: 06/25/2024 Prepared by: Garnette Montclair  Exercises - Seated Upper Trapezius Stretch  - 1 x daily - 7 x weekly - 2 sets - 2 reps - 1 min hold - Seated Levator Scapulae Stretch  - 1 x daily - 7 x weekly - 2 sets - 1 reps - 1 min hold - Seated Cervical Extension AROM  - 3 x daily - 1 x weekly - 1 sets - 10 reps - 5 hold - Seated Thoracic Lumbar Extension with Pectoralis Stretch  - 3 x daily - 7 x weekly - 1 sets - 10 reps - 5 sec hold  ASSESSMENT:  CLINICAL IMPRESSION: Pt with good response to treatment.    Deep source of pain per pt.  Continues to respond well to traction,  needs advice on various home traction units.  To have further evaluation for the UE and LE neural sx.     07/03/24: first Rx session following eval for combined manual techniques, TDN, and stretching.  Tolerated well, no change during session with radicular Sx.  Will continue to assess and progress as appropriate.   Eval:Patient is a 53 y.o. female referred  for physical therapy evaluation and treatment for R neck pain and R trapezius strain by PCP.  She demonstrates RUE weakness and stiffness throughout the R neck, shoulder, and elbow with postural deficits, trigger points in the R UT and levator with pain and also tingling intermittently into the RUE.   Patient is cleared for PT and would benefit from PT to address the above deficits.   She has good rehab potential to decreased her pain and improve her cervical ROM, shoulder/scapular strength and stability and be able to work painfree.   She has had PT here in past for this problem with good relief and is highly motivated to improve her condition.    OBJECTIVE IMPAIRMENTS: decreased knowledge of condition, decreased ROM, decreased strength, increased fascial restrictions, increased muscle spasms, impaired flexibility, impaired sensation, impaired UE functional use, improper body mechanics, postural dysfunction, and pain.   ACTIVITY LIMITATIONS: lifting, sitting, reach over head, and hygiene/grooming  PARTICIPATION LIMITATIONS: cleaning, laundry, and occupation  PERSONAL FACTORS: Profession and 1-2 comorbidities: ACDF, OA, migraines, HTN  are also affecting patient's functional outcome.   REHAB POTENTIAL: Good  CLINICAL DECISION MAKING: Evolving/moderate complexity  EVALUATION COMPLEXITY: Moderate   GOALS: Goals reviewed with patient? Yes  SHORT TERM GOALS: Target date: 07/24/24  Patient will be independent with initial HEP.  Baseline: 100% PT assistance required for correct completion  Goal status: INITIAL  2.  Patient will report 50% subjective improvement in pain as indicated by 4/10 worst  pain in the neck/R UT   Baseline: 8/10  worst  Goal status: MET- 07/18/24    LONG TERM GOALS: Target date: 08/21/24   Patient will be independent with advanced/ongoing HEP to improve outcomes and carryover.  Baseline: 100% PT assistance required for initial HEP, nothing advanced yet Goal status: INITIAL  2.  Patient will report 100% improvement in R shoulder pain to improve QOL.  Baseline: 8/10 worst pain  Goal status: INITIAL  3.  Patient to improve R shoulder AROM to [facility] without pain provocation to allow for increased ease of ADLs.  Baseline: 55 degrees R shoulder ER  Goal status: INITIAL  4.  Patient will demonstrate improved functional UE strength as demonstrated by 5/5 R shoulder strength for ability to do haircare and household and recreational activities without needing rest . Baseline: unable to complete without stopping for rest Goal status: INITIAL  5.  Patient will report 10 points improvement on QuickDash to demonstrate improved functional ability.  Baseline: 34.1 Goal status: INITIAL   PLAN:  PT FREQUENCY: 2x/week  PT DURATION: 8 weeks  PLANNED INTERVENTIONS: 97164- PT Re-evaluation, 97750- Physical Performance Testing, 97110-Therapeutic exercises, 97530- Therapeutic activity, [id number]- Neuromuscular re-education, 97535- Self Care, [id number]- Manual therapy, [id number]- Electrical stimulation (unattended), 97016- Vasopneumatic device, 97035- Ultrasound, 02966- Ionotophoresis 4mg /ml Dexamethasone , Patient/Family education, Taping, Joint mobilization, Spinal mobilization, DME instructions, Cryotherapy, Moist heat, and Biofeedback  PLAN FOR NEXT SESSION: continue postural strengthening and thoracic mobilization; traction for N/T    Micha Erck L Hayze Gazda, PT, DPT,OCS 07/26/2024, 2:22 PM

## 2024-07-30 ENCOUNTER — Encounter: Payer: Self-pay | Admitting: Medical

## 2024-07-30 ENCOUNTER — Other Ambulatory Visit: Payer: Self-pay | Admitting: Medical Oncology

## 2024-07-30 ENCOUNTER — Inpatient Hospital Stay (HOSPITAL_BASED_OUTPATIENT_CLINIC_OR_DEPARTMENT_OTHER): Payer: BC Managed Care – PPO | Admitting: Medical Oncology

## 2024-07-30 ENCOUNTER — Ambulatory Visit: Payer: Self-pay | Admitting: Medical

## 2024-07-30 ENCOUNTER — Ambulatory Visit (INDEPENDENT_AMBULATORY_CARE_PROVIDER_SITE_OTHER): Admitting: Medical

## 2024-07-30 ENCOUNTER — Inpatient Hospital Stay: Payer: BC Managed Care – PPO | Attending: Hematology & Oncology

## 2024-07-30 ENCOUNTER — Ambulatory Visit: Payer: Self-pay | Admitting: Medical Oncology

## 2024-07-30 VITALS — BP 129/72 | HR 78 | Temp 97.9°F | Resp 18 | Ht 62.0 in | Wt 172.1 lb

## 2024-07-30 VITALS — BP 122/82 | HR 75 | Resp 16 | Ht 62.0 in | Wt 173.2 lb

## 2024-07-30 DIAGNOSIS — D473 Essential (hemorrhagic) thrombocythemia: Secondary | ICD-10-CM

## 2024-07-30 DIAGNOSIS — D509 Iron deficiency anemia, unspecified: Secondary | ICD-10-CM

## 2024-07-30 DIAGNOSIS — R252 Cramp and spasm: Secondary | ICD-10-CM | POA: Diagnosis not present

## 2024-07-30 DIAGNOSIS — G629 Polyneuropathy, unspecified: Secondary | ICD-10-CM | POA: Diagnosis not present

## 2024-07-30 DIAGNOSIS — R5383 Other fatigue: Secondary | ICD-10-CM

## 2024-07-30 DIAGNOSIS — D75839 Thrombocytosis, unspecified: Secondary | ICD-10-CM | POA: Diagnosis not present

## 2024-07-30 DIAGNOSIS — Z7982 Long term (current) use of aspirin: Secondary | ICD-10-CM | POA: Insufficient documentation

## 2024-07-30 DIAGNOSIS — Z111 Encounter for screening for respiratory tuberculosis: Secondary | ICD-10-CM | POA: Diagnosis not present

## 2024-07-30 DIAGNOSIS — D649 Anemia, unspecified: Secondary | ICD-10-CM | POA: Diagnosis not present

## 2024-07-30 DIAGNOSIS — R232 Flushing: Secondary | ICD-10-CM | POA: Diagnosis not present

## 2024-07-30 LAB — CMP (CANCER CENTER ONLY)
ALT: 13 U/L (ref 0–44)
AST: 15 U/L (ref 15–41)
Albumin: 3.9 g/dL (ref 3.5–5.0)
Alkaline Phosphatase: 51 U/L (ref 38–126)
Anion gap: 11 (ref 5–15)
BUN: <5 mg/dL — ABNORMAL LOW (ref 6–20)
CO2: 23 mmol/L (ref 22–32)
Calcium: 8.7 mg/dL — ABNORMAL LOW (ref 8.9–10.3)
Chloride: 105 mmol/L (ref 98–111)
Creatinine: 0.87 mg/dL (ref 0.44–1.00)
GFR, Estimated: >60 mL/min (ref >60)
Glucose, Bld: 94 mg/dL (ref 70–99)
Potassium: 4 mmol/L (ref 3.5–5.1)
Sodium: 139 mmol/L (ref 135–145)
Total Bilirubin: 0.3 mg/dL (ref 0.0–1.2)
Total Protein: 6.4 g/dL — ABNORMAL LOW (ref 6.5–8.1)

## 2024-07-30 LAB — CBC WITH DIFFERENTIAL (CANCER CENTER ONLY)
Abs Immature Granulocytes: 0.03 K/uL (ref 0.00–0.07)
Basophils Absolute: 0.1 K/uL (ref 0.0–0.1)
Basophils Relative: 1 %
Eosinophils Absolute: 0.2 K/uL (ref 0.0–0.5)
Eosinophils Relative: 2 %
HCT: 34.4 % — ABNORMAL LOW (ref 36.0–46.0)
Hemoglobin: 11.8 g/dL — ABNORMAL LOW (ref 12.0–15.0)
Immature Granulocytes: 0 %
Lymphocytes Relative: 25 %
Lymphs Abs: 2.4 K/uL (ref 0.7–4.0)
MCH: 31.8 pg (ref 26.0–34.0)
MCHC: 34.3 g/dL (ref 30.0–36.0)
MCV: 92.7 fL (ref 80.0–100.0)
Monocytes Absolute: 0.5 K/uL (ref 0.1–1.0)
Monocytes Relative: 5 %
Neutro Abs: 6.3 K/uL (ref 1.7–7.7)
Neutrophils Relative %: 67 %
Platelet Count: 393 K/uL (ref 150–400)
RBC: 3.71 MIL/uL — ABNORMAL LOW (ref 3.87–5.11)
RDW: 13.1 % (ref 11.5–15.5)
WBC Count: 9.5 K/uL (ref 4.0–10.5)
nRBC: 0 % (ref 0.0–0.2)

## 2024-07-30 LAB — FERRITIN: Ferritin: 79 ng/mL (ref 11–307)

## 2024-07-30 LAB — T4, FREE: Free T4: 0.79 ng/dL (ref 0.60–1.60)

## 2024-07-30 LAB — FOLLICLE STIMULATING HORMONE: FSH: 18.7 m[IU]/mL

## 2024-07-30 LAB — MAGNESIUM: Magnesium: 2 mg/dL (ref 1.5–2.5)

## 2024-07-30 LAB — SAVE SMEAR(SSMR), FOR PROVIDER SLIDE REVIEW

## 2024-07-30 LAB — LACTATE DEHYDROGENASE: LDH: 139 U/L (ref 98–192)

## 2024-07-30 LAB — TSH: TSH: 1.26 u[IU]/mL (ref 0.35–5.50)

## 2024-07-30 LAB — IRON AND IRON BINDING CAPACITY (CC-WL,HP ONLY)
Iron: 64 ug/dL (ref 28–170)
Saturation Ratios: 18 % (ref 10.4–31.8)
TIBC: 361 ug/dL (ref 250–450)
UIBC: 297 ug/dL

## 2024-07-30 NOTE — Patient Instructions (Signed)
 1. Cramp in muscle (Primary)  - Magnesium  2. Fatigue, unspecified type  - TSH - T4, free - Vitamin B1  3. Neuropathy -tsh, t4 - Vitamin B1  4. Hot flashes  - FSH  5. Screening-pulmonary TB - QuantiFERON-TB Gold Plus   Finish filling out school form when tb gold back  Follow up date to be determined after lab review.

## 2024-07-30 NOTE — Progress Notes (Signed)
 Hematology and Oncology Follow Up Visit  Ann Frederick [id number] Jun 16, 1971 53 y.o. 07/30/2024  Past Medical History:  Diagnosis Date   Acne    Anemia    history of   Anxiety    Arthritis    Depression    GERD (gastroesophageal reflux disease)    worse while pregnant   Headache    Migraines   Heart murmur    ? heart murmur in past   History of blood transfusion 2013   Hypertension    Metabolic syndrome     Principle Diagnosis:  Thrombocytosis - JAK 2 negative  Current Therapy:   81 mg asa once daily Prenatal vitamin off and on Super B Complex   Interim History:  Ann Frederick is back for follow-up.   She had a CT scan on 06/09/2024 which showed bilateral perirenal stranding.  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 She was referred to our office for this as well. Fortunately on PET scan this area had completely resolved with no other concerning areas for malignancy shown.   Today she states that she is feeling well. She really has had no specific complaints.    She has had no problems with dysuria.  There is been no hematuria.  She has had no fever.  She has had no rashes.  There is been no bleeding.  She has had no leg swelling.  She has had no hot flashes or sweats.  There is been no weight loss.  She continues to work.  She is on baby aspirin  because of the thrombocytosis.  Currently, I would say performance status about ECOG 1.    Wt Readings from Last 3 Encounters:  07/30/24 172 lb 1.9 oz (78.1 kg)  07/06/24 165 lb 6.4 oz (75 kg)  06/27/24 168 lb (76.2 kg)     Medications:   Current Outpatient Medications:    amLODipine  (NORVASC ) 5 MG tablet, Take 1 tablet (5 mg total) by mouth daily., Disp: 30 tablet, Rfl: 0   aspirin  EC 81 MG tablet, Take 81 mg by mouth in the morning. Swallow whole., Disp: , Rfl:    buPROPion  (WELLBUTRIN  XL) 300 MG 24 hr tablet, Take 300 mg by mouth every morning., Disp: , Rfl:     busPIRone  (BUSPAR ) 15 MG tablet, TAKE 1 TABLET BY MOUTH TWICE A DAY, Disp: 180 tablet, Rfl: 2   Cholecalciferol (VITAMIN D3) 1000 units CAPS, Take 1,000 Units by mouth daily., Disp: , Rfl:    cyclobenzaprine  (FLEXERIL ) 10 MG tablet, 1 tab po q hs prn neck pain, Disp: 7 tablet, Rfl: 1   estradiol (ESTRACE) 2 MG tablet, Take 2 mg by mouth daily., Disp: , Rfl:    ezetimibe  (ZETIA ) 10 MG tablet, Take 1 tablet (10 mg total) by mouth daily., Disp: 90 tablet, Rfl: 3   gabapentin  (NEURONTIN ) 300 MG capsule, Take 1 capsule twice daily, then take 2 capsules at bedtime, Disp: 120 capsule, Rfl: 2   [Paused] losartan  (COZAAR ) 25 MG tablet, Take 25 mg by mouth daily., Disp: , Rfl:    LYSINE PO, Take 1 capsule by mouth daily., Disp: , Rfl:    ondansetron  (ZOFRAN -ODT) 4 MG disintegrating tablet, Take 1 tablet (4 mg total) by mouth every 8 (eight) hours as needed., Disp: 8 tablet, Rfl: 0   Prenatal Vit-Fe Fumarate-FA (MULTIVITAMIN-PRENATAL) 27-0.8 MG TABS tablet, Take 1 tablet by mouth daily at 12 noon., Disp: , Rfl:    prochlorperazine  (COMPAZINE ) 5 MG tablet,  Take 1-2 tablets (5-10 mg total) by mouth every 6 (six) hours as needed for nausea, vomiting or refractory nausea / vomiting., Disp: 15 tablet, Rfl: 2   Semaglutide -Weight Management (WEGOVY ) 0.25 MG/0.5ML SOAJ, One injection 0.25 mg weekly, Disp: 2 mL, Rfl: 0   UNABLE TO FIND, Med Name: KETAMINE  250MG  ODT RASPBERRY, Disp: , Rfl:    valACYclovir  (VALTREX ) 1000 MG tablet, Take 2 tablets (2,000 mg total) by mouth 2 (two) times daily. (Patient taking differently: Take 2,000 mg by mouth 2 (two) times daily. BID prn), Disp: 4 tablet, Rfl: 0   Zinc  50 MG TABS, Take 50 mg by mouth daily., Disp: , Rfl:    zolpidem  (AMBIEN ) 10 MG tablet, Take 5-10 mg by mouth at bedtime as needed for sleep., Disp: , Rfl:   Allergies:  Allergies  Allergen Reactions   Azithromycin  Diarrhea and Nausea And Vomiting   Dilaudid  [Hydromorphone ] Nausea Only    Past Medical History,  Surgical history, Social history, and Family History were reviewed and updated.  Review of Systems: Review of Systems  Constitutional: Negative.   HENT: Negative.    Eyes: Negative.   Respiratory: Negative.    Cardiovascular: Negative.   Gastrointestinal:  Negative for abdominal pain and nausea.  Genitourinary:  Negative for dysuria and frequency.  Skin: Negative.   Neurological: Negative.   Endo/Heme/Allergies: Negative.   Psychiatric/Behavioral: Negative.       Physical Exam:  height is 5' 2 (1.575 m) and weight is 172 lb 1.9 oz (78.1 kg). Her oral temperature is 97.9 F (36.6 C). Her blood pressure is 129/72 and her pulse is 78. Her respiration is 18 and oxygen saturation is 97%.   Physical Exam Vitals reviewed.  HENT:     Head: Normocephalic and atraumatic.  Eyes:     Pupils: Pupils are equal, round, and reactive to light.  Cardiovascular:     Rate and Rhythm: Normal rate and regular rhythm.     Heart sounds: Normal heart sounds.  Pulmonary:     Effort: Pulmonary effort is normal.     Breath sounds: Normal breath sounds.  Abdominal:     General: Bowel sounds are normal.     Palpations: Abdomen is soft.  Musculoskeletal:        General: No tenderness or deformity. Normal range of motion.     Cervical back: Normal range of motion.  Lymphadenopathy:     Cervical: No cervical adenopathy.  Skin:    General: Skin is warm and dry.     Findings: No erythema or rash.  Neurological:     Mental Status: She is alert and oriented to person, place, and time.  Psychiatric:        Behavior: Behavior normal.        Thought Content: Thought content normal.        Judgment: Judgment normal.      Lab Results  Component Value Date   WBC 9.5 07/30/2024   HGB 11.8 (L) 07/30/2024   HCT 34.4 (L) 07/30/2024   MCV 92.7 07/30/2024   PLT 393 07/30/2024     Chemistry      Component Value Date/Time   NA 138 07/06/2024 0925   K 4.5 07/06/2024 0925   CL 103 07/06/2024 0925    CO2 32 07/06/2024 0925   BUN 8 07/06/2024 0925   CREATININE 0.92 07/06/2024 0925   CREATININE 0.94 06/27/2024 1206      Component Value Date/Time   CALCIUM  9.6 07/06/2024 0925  ALKPHOS 50 07/06/2024 0925   AST 13 07/06/2024 0925   AST 15 06/20/2024 1343   ALT 12 07/06/2024 0925   ALT 19 06/20/2024 1343   BILITOT 0.5 07/06/2024 0925   BILITOT 0.4 06/20/2024 1343     Encounter Diagnoses  Name Primary?   Thrombocytosis Yes   Iron deficiency anemia, unspecified iron deficiency anemia type    Assessment and Plan- Patient is a 52 y.o. female who is followed by our office for thrombocythemia.  She is JAK2 negative- 11/28/2023.    Today her CBC shows a WBC count of 9.5, Hgb of 11.8, MCV of 92.7, platelet count of 393 Iron studies pending She will continue her supplements at this time  RTC 4 months APP, labs (CBC w/, CMP, LDH, smear, iron, ferritin, B12, folate, erythro, retic)  Lauraine Dais PA-C 8/4/202510:17 AM

## 2024-07-30 NOTE — Progress Notes (Signed)
   Subjective:    Patient ID: Ann Frederick, female    DOB: [birth year], 53 y.o.   MRN: [id number]  HPI  Pt update me that she did talk with neurosurgeon about neck pain. Per pt her chin tingling and anterior neck symptoms not coming from her c spine.   Pt states her neck pain bulging  not causing lip tingling for 2 weeks. Neurosurgeon thinks her symptoms are metabolic. He thinks pt thinks she need thyroid  test and magnesium.  Also pt feels possible hot flashes. Surgeon recommended estradiol level.  No motor or sensory deficits.   Pt reports some fatigue. Some occasional muscle cramps in shoulder rt shoulder.   Also being seen to fill out [employer] school pysical exam form. She also need tb gold test.      Review of Systems See hpi    Objective:   Physical Exam  General- No acute distress. Pleasant patient. Neck- Full range of motion, no jvd Lungs- Clear, even and unlabored. Heart- regular rate and rhythm. Neurologic- CNII- XII grossly intact.       Assessment & Plan:   Patient Instructions  1. Cramp in muscle (Primary)  - Magnesium  2. Fatigue, unspecified type  - TSH - T4, free - Vitamin B1  3. Neuropathy -tsh, t4 - Vitamin B1  4. Hot flashes  - FSH  5. Screening-pulmonary TB - QuantiFERON-TB Gold Plus   Finish filling out school form when tb gold back  Follow up date to be determined after lab review.   Cailah Reach, PA-C

## 2024-07-31 ENCOUNTER — Ambulatory Visit: Admitting: Rehabilitation

## 2024-08-02 ENCOUNTER — Ambulatory Visit: Attending: Medical

## 2024-08-02 DIAGNOSIS — M6281 Muscle weakness (generalized): Secondary | ICD-10-CM | POA: Insufficient documentation

## 2024-08-02 DIAGNOSIS — M542 Cervicalgia: Secondary | ICD-10-CM | POA: Diagnosis not present

## 2024-08-02 DIAGNOSIS — M436 Torticollis: Secondary | ICD-10-CM | POA: Diagnosis not present

## 2024-08-02 DIAGNOSIS — M25611 Stiffness of right shoulder, not elsewhere classified: Secondary | ICD-10-CM | POA: Insufficient documentation

## 2024-08-02 DIAGNOSIS — R293 Abnormal posture: Secondary | ICD-10-CM | POA: Diagnosis not present

## 2024-08-02 NOTE — Therapy (Signed)
 OUTPATIENT PHYSICAL THERAPY SHOULDER TREATMENT   Patient Name: Ann Frederick MRN: [id number] DOB:[birth year], 53 y.o., female Today's Date: 08/02/2024  END OF SESSION:  PT End of Session - 08/02/24 1408     Visit Number 8    Date for PT Re-Evaluation 08/21/24    Authorization Type [employer] commercial    Progress Note Due on Visit 10    PT Start Time 1401    PT Stop Time 1442    PT Time Calculation (min) 41 min    Activity Tolerance Patient tolerated treatment well;No increased pain    Behavior During Therapy WFL for tasks assessed/performed               Past Medical History:  Diagnosis Date   Acne    Anemia    history of   Anxiety    Arthritis    Depression    GERD (gastroesophageal reflux disease)    worse while pregnant   Headache    Migraines   Heart murmur    ? heart murmur in past   History of blood transfusion 2013   Hypertension    Metabolic syndrome    Past Surgical History:  Procedure Laterality Date   ANTERIOR FUSION CERVICAL SPINE  2018   CESAREAN SECTION     x 2   CESAREAN SECTION  09/29/2012   Procedure: CESAREAN SECTION;  Surgeon: Alm JAYSON Cook, MD;  Location: [facility];  Service: Obstetrics;  Laterality: N/A;  Repeat edc 10/12/12/REQUEST;Chassity,Dee,Colleen   CHOLECYSTECTOMY N/A 02/26/2024   Procedure: LAPAROSCOPIC CHOLECYSTECTOMY  INTRAOPERATIVE CHOLANGIOGRAM;  Surgeon: Sheldon Standing, MD;  Location: [facility];  Service: General;  Laterality: N/A;   COLONOSCOPY     ESOPHAGOGASTRODUODENOSCOPY  normal    7/12   JOINT REPLACEMENT Bilateral    LAPAROSCOPIC VAGINAL HYSTERECTOMY WITH SALPINGECTOMY Bilateral 02/23/2016   Procedure: LAPAROSCOPIC ASSISTED VAGINAL HYSTERECTOMY WITH bilateral SALPINGECTOMY, right oophorectomy. laporotic repair of incidental cystotomy.;  Surgeon: Alm Cook, MD;  Location: [facility];  Service: Gynecology;  Laterality: Bilateral;   MOUTH SURGERY     Patient Active Problem List   Diagnosis Date Noted   HTN (hypertension) 06/11/2024    Obesity (BMI 30-39.9) 06/11/2024   AKI (acute kidney injury) (HCC) 06/09/2024   Acute on chronic cholecystitis 02/25/2024   Cholecystitis 02/25/2024   Left hip pain 08/27/2019   Class 1 obesity due to excess calories without serious comorbidity with body mass index (BMI) of 34.0 to 34.9 in adult 11/28/2018   Heart palpitations 11/28/2018   OSA (obstructive sleep apnea) 09/14/2018   Neck pain 04/26/2018   Wellness examination 03/08/2016   S/P laparoscopic assisted vaginal hysterectomy (LAVH) 02/23/2016   Pink eye 06/19/2015   Pain of right thumb 06/19/2015   Sinusitis, acute frontal 02/05/2015   Headache around the eyes 02/05/2015   Back pain 11/01/2014   Routine general medical examination at a health care facility 10/30/2013   Vesicular rash 09/07/2013   Sinusitis 02/02/2013   Viral pharyngitis 03/03/2012   Flank pain 08/13/2011   Inguinal adenopathy 08/13/2011   Dysphagia 04/20/2011   GERD 01/29/2011   FATIGUE 01/29/2011   SCIATICA, BILATERAL 06/18/2010   LEG PAIN, BILATERAL 06/06/2010   HIP PAIN, LEFT 04/01/2010   MASS, LOCALIZED, SUPERFICIAL 03/05/2009   ACNE VULGARIS 07/11/2007   MURMUR 07/11/2007    PCP: Dorina Dallas RIGGERS   REFERRING PROVIDER: Dorina Dallas, PA-C   REFERRING DIAG: M25.511 (ICD-10-CM) - Acute pain of right shoulderS46.811A (ICD-10-CM) - Strain of right trapezius muscle, initial encounter  THERAPY DIAG:  Neck pain  Muscle weakness (generalized)  Abnormal posture  Cervicalgia  Neck stiffness  Rationale for Evaluation and Treatment: Rehabilitation  ONSET DATE: last 1-2 months   NEXT MD VISIT:  no f/u for neck pain unless needed;  PET scan 07/04/24 per oncologist SUBJECTIVE:                                                                                                                                                                                      SUBJECTIVE STATEMENT:   Pt reports feeling like a pressure in her R palm, it's  intermittent and overall her pain is intermittent  EVAL:  Patient presents to PT for R sided cervical/upper trap pain that has increased over the last 1-2 months for no apparent reason.  She denies any trauma or MOI.  She  also reports tingling sensation into the R arm into her fingers (all fingers) intermittently.  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   Reports has 3 computer monitors that she views throughout the workday.  She does not have an elevating work station that would allow her to stand and work.    States pain usually better in the AM and is worse  as the workday goes on.   States feels better with correct positioning and cross body shoulder HADD stretching.  She does not recall her HEP from therapy here a year ago, but states that it helped when she was doing it previously.   Of note, Patient was hospitalized in 3/25 for Abd pain, cholecystitis, and cholecystectomy.   She was hospitalized again 2 weeks ago for abdominal pain.  Abdominal CT demonstrated perirenal stranding and she has f/u with oncology and is scheduled for PET scan soon to r/o CA.    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 Yes: NPRS scale: 2/10 now, 8/10 worst Pain location: R UT  Pain description: aching, intermittent pain in RUT and intermittent tingling into the RUE into the fingers (all fingers).   Aggravating factors: working and sitting thru the day at computer, [employer] Relieving factors: stretching neck into sidebending  PRECAUTIONS: h/o ACDF  RED FLAGS: None   WEIGHT BEARING RESTRICTIONS: No  FALLS:  Has patient fallen in last 6 months? 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  DIAGNOSTIC FINDINGS:  N/a  PATIENT SURVEYS:  Quick Dash: 34.1%  COGNITION: Overall cognitive status: Within functional limits for tasks assessed     SENSATION: WFL  POSTURE: Forward head rounded shoulders   UPPER EXTREMITY ROM:   Active ROM Right eval Left eval R 08/02/24  Shoulder flexion 180 p 180 180- mild tingling  Shoulder extension 55 60   Shoulder abduction 175 p 175 170  Shoulder adduction Reach to opposite shoulder Reach to opposite shoulder   Shoulder internal rotation To L1 To T10 T7  Shoulder external rotation To C7 p To T2 To T3  Elbow flexion     Elbow extension     Wrist flexion     Wrist extension     Wrist ulnar deviation     Wrist radial deviation     Wrist pronation     Wrist supination     (Blank rows = not tested)  UPPER EXTREMITY MMT:  MMT Right eval Left eval  Shoulder flexion 4   p 5  Shoulder extension 4- 4+  Shoulder abduction 4 p 4+  Shoulder adduction    Shoulder internal rotation 4 4+  Shoulder external rotation 4- 4  Middle trapezius    Lower trapezius    Elbow flexion 4 5  Elbow extension 4 4  Wrist flexion    Wrist extension    Wrist ulnar deviation    Wrist radial deviation    Wrist pronation    Wrist supination    Grip strength (lbs) 10 kg 8 kg  (Blank rows = not tested)  SHOULDER SPECIAL TESTS: Impingement tests: Hawkins/Kennedy impingement test: positive  SLAP lesions: NT Instability tests: NT Rotator cuff assessment: Infraspinatus test: negative Biceps assessment: NT  NECK SPECIAL TESTS:  Spurling's is negative bilaterally.  Compression is negative, distraction feel good, but no impact on RUE symptoms  JOINT MOBILITY  TESTING:  WNL for R shoulder    PALPATION:  TTP over R UT and levator with trigger points in both                                                                                                                             TREATMENT DATE:  08/02/24 UBE L1.5 x 6 min Scapular mobs R side all directions and circles each way STM to R rhomboids, thoracic PS in prone Prone rows 3lb x 20 RUE Prone Shoulder ext 3lb x 15 RUE Swiffer sweeping with R UE x 20 each flex and scap  Standing lat pull down blue TB x 10    Combo US /ESU to L upper trap/levator Tp:   100% US  x 1.5 W/cm2 with Hi-Volt ESU machine default settings x 120 mV  x 8' Cervical traction 25lb max pull, 60/20 cycles, x 10 min 07/26/24:  UBE L1.0 x 6 min Therex: seated rows hands vertical 15#  x 15 Seated rows hand horizontal 15# x 15 Standing with back to wall for serving tray B UE with 1# wts 15 reps Standing scaption with 1 # wts  15 reps Standing open books 15 x each side  Combo US /ESU to L upper trap/levator Tp:   100% US  x 1.5 W/cm2 with Hi-Volt ESU machine default settings x 120 mV x 8' Cervical traction 25lb max pull, 60/20 cycles, x 10 min 07/18/24 UBE L1.0 x 6 min Standing rows RTB x 30- muscles more shaky today Standing shoulder ext RTB x 30 Standing horiz ABD RTB anchored to door x 15 Standing B ER RTB x 20 Standing chin tuck x 10 Standing shoulder flex and scap x 1lb each w/ chin tuck Wall angels x 10  Open books standing x 10 B Cervical traction 25lb max pull, 60/20 cycles, x 10 min  07/12/24  THERAPEUTIC EXERCISE: To improve strength.  Demonstration, verbal and tactile cues throughout for technique. UBE L2 x 6' Back  Rowing GTB x 30 BUE Rowing GTB x 30 RUE only Foam roll Shoulder ER RTB x 30 BUE Foam roll shoulder HABD RTB x 30 BUE Foam roll scapular depression Prone HABD x 20 BUE Prone LT raises x 20 alternating BUE L Cervical SB stretch x 1' x 2  POE w/ cervical retraction x 20 Prone grade 3-4 upper  thoracic P/A mobilization to T1-T4 x 10-20 ea level  Combo US /ESU to L upper trap/levator Tp:   100% US  x 1.5 W/cm2 with Hi-Volt ESU machine default settings x 120 mV x 8'  Intermittent Cervical Traction 60 sec hold/20 sec release @ 22 lb pull x 15'  07/09/24 THERAPEUTIC EXERCISE: To improve strength and endurance.  Demonstration, verbal and tactile cues throughout for technique. UBE L2 x 5' back THERAPEUTIC ACTIVITIES: To improve functional performance.  Demonstration, verbal and tactile cues throughout for technique. B shoulder extension RTB x 20 B rowing Blue TB x 20 Doorway B shoulder ER RTB x 20 Doorway B shoulder HABD RTB x 20 Corner pec stretch x 1' x 2 Foam roll lying palms up x 2' Foam roll with alternate shoulder flexion x 20 BUE Foam roll w/ scapular depression x 20 BUE Seated L cervical SB stretch x 1' x 2   Combo US /ESU to L upper trap/levator Tp:   100% US  x 1.5 W/cm2 with Hi-Volt ESU machine default settings x 120 mV x 10'  07/05/24 Saunders cervical traction x at 12 lb pull x 10' for radicular-like symptoms  MFR, subocciptal release, deep pressure Tp release to R UT and levator Tp x 15'  Combo treatment to L UT/levator area:  100% US  x 1.5 w/cm2 with Hi-Volt estim at 110 mV x 10'  Supine BUE shoulder ER RTB x 20 Supine BUE shoulder HABD RTB x 20 Supine towel cervical traction self assisted--needs teaching from PT for correct angle and correct pull Supine nerve flossing x 20 for RUE median n. Standing nerve flossing x 20 for R median n. Doorway pec/thoracic stretch x 1' x 2 BUE Seated cervical SB stretch x 1' x 2 BUE UBE L1 x 5' backward  07/03/24:  Supine for manual stretching R upper trpas and levator, with outs of manual cervical distraction  Trigger Point Dry Needling  Initial Treatment: Pt instructed on Dry Needling rational, procedures, and possible side  effects. Pt instructed to expect mild to moderate muscle soreness later in the day and/or into the next  day.  Pt instructed in methods to reduce muscle soreness. Pt instructed to continue prescribed HEP. Because Dry Needling was performed over or adjacent to a lung field, pt was educated on S/S of pneumothorax and to seek immediate medical attention should they occur.  Patient was educated on signs and symptoms of infection and other risk factors and advised to seek medical attention should they occur.  Patient verbalized understanding of these instructions and education.   Patient Verbal Consent Given: Yes Education Handout Provided: Previously Provided Muscles Treated: r upper traps, R C 6 paraspinals, performed by Amy Speaks, PT, R levator, R rhomboid, R subscap, R middle traps  performed by Elijah Brigitte ALMETA Ivin Iona Performed: No Treatment Response/Outcome: twitch repsonse, decreased muscular resistance   Reviewed home ex program removed the cervical extension, instructed in adapting R levator stretch with self occipital distraction L hand,\ Added B shoulder Er formiddle traps engagement with yellow t band   06/25/24    PATIENT EDUCATION:  Education details: PT eval findings, anticipated POC, progress with PT, ongoing PT POC, initial HEP, postural awareness, posture and body mechanics for typical daily postioning, mobility and household tasks, self-STM techniques to R UT and levator using massage wand, role of TPDN, TPDN rational, procedure, outcomes, potential side effects, and recommended post-treatment exercises/activity, and fee schedule for TPDN and lack of insurance coverage requiring payment at time of service  Person educated: Patient Education method: Explanation, Demonstration, Verbal cues, Tactile cues, and MedBridgeGO app access provided Education comprehension: verbal cues required, tactile cues required, and needs further education   HOME EXERCISE PROGRAM: Access Code: [email or web address] URL: [email or web address] Date: 08/02/2024 Prepared by: Velinda Wrobel  Exercises - Seated Upper Trapezius Stretch  - 1 x daily - 7 x weekly - 2 sets - 2 reps - 1 min hold - Seated Levator Scapulae Stretch  - 1 x daily - 7 x weekly - 2 sets - 1 reps - 1 min hold - Seated Cervical Extension AROM  - 3 x daily - 1 x weekly - 1 sets - 10 reps - 5 hold - Seated Thoracic Lumbar Extension with Pectoralis Stretch  - 3 x daily - 7 x weekly - 1 sets - 10 reps - 5 sec hold - Prone Shoulder Row  - 1 x daily - 7 x weekly - 3 sets - 10 reps - Prone Shoulder Extension - Single Arm with Dumbbell  - 1 x daily - 7 x weekly - 3 sets - 10 reps  ASSESSMENT:  CLINICAL IMPRESSION: Pt showing good ROM today, although reports some tingling in R UE with flexion. Pt reporting tightness along medial scapular border, so we did some mobilizations and STM. She fatigued rather quick in the R shoulder with the periscapular strengthening. She denied traction today as she felt she did not need it.    07/03/24: first Rx session following eval for combined manual techniques, TDN, and stretching.  Tolerated well, no change during session with radicular Sx.  Will continue to assess and progress as appropriate.   Eval:Patient is a 53 y.o. female referred  for physical therapy evaluation and treatment for R neck pain and R trapezius strain by PCP.  She demonstrates RUE weakness and stiffness throughout the R neck, shoulder, and elbow with postural deficits, trigger points in the R UT and levator with pain and also tingling intermittently into the RUE.  Patient is cleared for PT and would benefit from PT to address the above deficits.   She has good rehab potential to decreased her pain and improve her cervical ROM, shoulder/scapular strength and stability and be able to work painfree.   She has had PT here in past for this problem with good relief and is highly motivated to improve her condition.    OBJECTIVE IMPAIRMENTS: decreased knowledge of condition, decreased ROM, decreased strength, increased  fascial restrictions, increased muscle spasms, impaired flexibility, impaired sensation, impaired UE functional use, improper body mechanics, postural dysfunction, and pain.   ACTIVITY LIMITATIONS: lifting, sitting, reach over head, and hygiene/grooming  PARTICIPATION LIMITATIONS: cleaning, laundry, and occupation  PERSONAL FACTORS: Profession and 1-2 comorbidities: ACDF, OA, migraines, HTN  are also affecting patient's functional outcome.   REHAB POTENTIAL: Good  CLINICAL DECISION MAKING: Evolving/moderate complexity  EVALUATION COMPLEXITY: Moderate   GOALS: Goals reviewed with patient? Yes  SHORT TERM GOALS: Target date: 07/24/24  Patient will be independent with initial HEP.  Baseline: 100% PT assistance required for correct completion  Goal status: INITIAL  2.  Patient will report 50% subjective improvement in pain as indicated by 4/10 worst pain in the neck/R UT   Baseline: 8/10 worst  Goal status: MET- 07/18/24    LONG TERM GOALS: Target date: 08/21/24   Patient will be independent with advanced/ongoing HEP to improve outcomes and carryover.  Baseline: 100% PT assistance required for initial HEP, nothing advanced yet Goal status: INITIAL  2.  Patient will report 100% improvement in R shoulder pain to improve QOL.  Baseline: 8/10 worst pain  Goal status: INITIAL  3.  Patient to improve R shoulder AROM to [facility] without pain provocation to allow for increased ease of ADLs.  Baseline: 55 degrees R shoulder ER  Goal status: IN PROGRESS- 08/02/24 good ROM but avoids resisted OH reaching d/t pain  4.  Patient will demonstrate improved functional UE strength as demonstrated by 5/5 R shoulder strength for ability to do haircare and household and recreational activities without needing rest . Baseline: unable to complete without stopping for rest Goal status: INITIAL  5.  Patient will report 10 points improvement on QuickDash to demonstrate improved functional ability.  Baseline:  34.1 Goal status: INITIAL   PLAN:  PT FREQUENCY: 2x/week  PT DURATION: 8 weeks  PLANNED INTERVENTIONS: 97164- PT Re-evaluation, 97750- Physical Performance Testing, 97110-Therapeutic exercises, 97530- Therapeutic activity, 97112- Neuromuscular re-education, 97535- Self Care, [id number]- Manual therapy, [id number]- Electrical stimulation (unattended), 97016- Vasopneumatic device, 97035- Ultrasound, 02966- Ionotophoresis 4mg /ml Dexamethasone , Patient/Family education, Taping, Joint mobilization, Spinal mobilization, DME instructions, Cryotherapy, Moist heat, and Biofeedback  PLAN FOR NEXT SESSION: continue postural strengthening and thoracic mobilization; traction for N/T    Sol LITTIE Gaskins, PTA 08/02/2024, 2:42 PM

## 2024-08-03 LAB — QUANTIFERON-TB GOLD PLUS
Mitogen-NIL: 9.16 [IU]/mL
NIL: 0.01 [IU]/mL
QuantiFERON-TB Gold Plus: NEGATIVE
TB1-NIL: 0 [IU]/mL
TB2-NIL: 0 [IU]/mL

## 2024-08-03 LAB — VITAMIN B1

## 2024-08-03 LAB — EXTRA SPECIMEN

## 2024-08-07 ENCOUNTER — Ambulatory Visit

## 2024-08-07 NOTE — Therapy (Incomplete)
 OUTPATIENT PHYSICAL THERAPY SHOULDER TREATMENT   Patient Name: Ann Frederick MRN: [id number] DOB:1971/01/15, 53 y.o., female Today's Date: 08/07/2024  END OF SESSION:         Past Medical History:  Diagnosis Date   Acne    Anemia    history of   Anxiety    Arthritis    Depression    GERD (gastroesophageal reflux disease)    worse while pregnant   Headache    Migraines   Heart murmur    ? heart murmur in past   History of blood transfusion 2013   Hypertension    Metabolic syndrome    Past Surgical History:  Procedure Laterality Date   ANTERIOR FUSION CERVICAL SPINE  2018   CESAREAN SECTION     x 2   CESAREAN SECTION  09/29/2012   Procedure: CESAREAN SECTION;  Surgeon: Alm JAYSON Cook, MD;  Location: [facility];  Service: Obstetrics;  Laterality: N/A;  Repeat edc 10/12/12/REQUEST;Chassity,Dee,Colleen   CHOLECYSTECTOMY N/A 02/26/2024   Procedure: LAPAROSCOPIC CHOLECYSTECTOMY  INTRAOPERATIVE CHOLANGIOGRAM;  Surgeon: Sheldon Standing, MD;  Location: [facility];  Service: General;  Laterality: N/A;   COLONOSCOPY     ESOPHAGOGASTRODUODENOSCOPY  normal    7/12   JOINT REPLACEMENT Bilateral    LAPAROSCOPIC VAGINAL HYSTERECTOMY WITH SALPINGECTOMY Bilateral 02/23/2016   Procedure: LAPAROSCOPIC ASSISTED VAGINAL HYSTERECTOMY WITH bilateral SALPINGECTOMY, right oophorectomy. laporotic repair of incidental cystotomy.;  Surgeon: Alm Cook, MD;  Location: [facility];  Service: Gynecology;  Laterality: Bilateral;   MOUTH SURGERY     Patient Active Problem List   Diagnosis Date Noted   HTN (hypertension) 06/11/2024   Obesity (BMI 30-39.9) 06/11/2024   AKI (acute kidney injury) (HCC) 06/09/2024   Acute on chronic cholecystitis 02/25/2024   Cholecystitis 02/25/2024   Left hip pain 08/27/2019   Class 1 obesity due to excess calories without serious comorbidity with body mass index (BMI) of 34.0 to 34.9 in adult 11/28/2018   Heart palpitations 11/28/2018   OSA (obstructive sleep apnea) 09/14/2018    Neck pain 04/26/2018   Wellness examination 03/08/2016   S/P laparoscopic assisted vaginal hysterectomy (LAVH) 02/23/2016   Pink eye 06/19/2015   Pain of right thumb 06/19/2015   Sinusitis, acute frontal 02/05/2015   Headache around the eyes 02/05/2015   Back pain 11/01/2014   Routine general medical examination at a health care facility 10/30/2013   Vesicular rash 09/07/2013   Sinusitis 02/02/2013   Viral pharyngitis 03/03/2012   Flank pain 08/13/2011   Inguinal adenopathy 08/13/2011   Dysphagia 04/20/2011   GERD 01/29/2011   FATIGUE 01/29/2011   SCIATICA, BILATERAL 06/18/2010   LEG PAIN, BILATERAL 06/06/2010   HIP PAIN, LEFT 04/01/2010   MASS, LOCALIZED, SUPERFICIAL 03/05/2009   ACNE VULGARIS 07/11/2007   MURMUR 07/11/2007    PCP: Dorina Dallas RIGGERS   REFERRING PROVIDER: Dorina Dallas, PA-C   REFERRING DIAG: M25.511 (ICD-10-CM) - Acute pain of right shoulderS46.811A (ICD-10-CM) - Strain of right trapezius muscle, initial encounter  THERAPY DIAG:  No diagnosis found.  Rationale for Evaluation and Treatment: Rehabilitation  ONSET DATE: last 1-2 months   NEXT MD VISIT:  no f/u for neck pain unless needed;  PET scan 07/04/24 per oncologist SUBJECTIVE:  SUBJECTIVE STATEMENT:   Pt reports feeling like a pressure in her R palm, it's intermittent and overall her pain is intermittent  EVAL:  Patient presents to PT for R sided cervical/upper trap pain that has increased over the last 1-2 months for no apparent reason.  She denies any trauma or MOI.  She  also reports tingling sensation into the R arm into her fingers (all fingers) intermittently.  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   Reports has 3 computer monitors that she views throughout the  workday.  She does not have an elevating work station that would allow her to stand and work.    States pain usually better in the AM and is worse  as the workday goes on.   States feels better with correct positioning and cross body shoulder HADD stretching.  She does not recall her HEP from therapy here a year ago, but states that it helped when she was doing it previously.   Of note, Patient was hospitalized in 3/25 for Abd pain, cholecystitis, and cholecystectomy.   She was hospitalized again 2 weeks ago for abdominal pain.  Abdominal CT demonstrated perirenal stranding and she has f/u with oncology and is scheduled for PET scan soon to r/o CA.    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 Yes: NPRS scale: 2/10 now, 8/10 worst Pain location: R UT  Pain description: aching, intermittent pain in RUT and intermittent tingling into the RUE into the fingers (all fingers).   Aggravating factors: working and sitting thru the day at computer, [employer] Relieving factors: stretching neck into sidebending  PRECAUTIONS: h/o ACDF  RED FLAGS: None   WEIGHT BEARING RESTRICTIONS: No  FALLS:  Has patient fallen in last 6 months? 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  DIAGNOSTIC FINDINGS:  N/a  PATIENT SURVEYS:  Quick Dash: 34.1%  COGNITION: Overall cognitive status: Within functional limits for tasks  assessed     SENSATION: WFL  POSTURE: Forward head rounded shoulders   UPPER EXTREMITY ROM:   Active ROM Right eval Left eval R 08/02/24  Shoulder flexion 180 p 180 180- mild tingling  Shoulder extension 55 60   Shoulder abduction 175 p 175 170  Shoulder adduction Reach to opposite shoulder Reach to opposite shoulder   Shoulder internal rotation To L1 To T10 T7  Shoulder external rotation To C7 p To T2 To T3  Elbow flexion     Elbow extension     Wrist flexion     Wrist extension     Wrist ulnar deviation     Wrist radial deviation     Wrist pronation     Wrist supination     (Blank rows = not tested)  UPPER EXTREMITY MMT:  MMT Right eval Left eval  Shoulder flexion 4   p 5  Shoulder extension 4- 4+  Shoulder abduction 4  p 4+  Shoulder adduction    Shoulder internal rotation 4 4+  Shoulder external rotation 4- 4  Middle trapezius    Lower trapezius    Elbow flexion 4 5  Elbow extension 4 4  Wrist flexion    Wrist extension    Wrist ulnar deviation    Wrist radial deviation    Wrist pronation    Wrist supination    Grip strength (lbs) 10 kg 8 kg  (Blank rows = not tested)  SHOULDER SPECIAL TESTS: Impingement tests: Hawkins/Kennedy impingement test: positive  SLAP lesions: NT Instability tests: NT Rotator cuff assessment: Infraspinatus test: negative Biceps assessment: NT  NECK SPECIAL TESTS:  Spurling's is negative bilaterally.  Compression is negative, distraction feel good, but no impact on RUE symptoms  JOINT MOBILITY TESTING:  WNL for R shoulder    PALPATION:  TTP over R UT and levator with trigger points in both                                                                                                                             TREATMENT DATE:  08/02/24 UBE L1.5 x 6 min Scapular mobs R side all directions and circles each way STM to R rhomboids, thoracic PS in prone Prone rows 3lb x 20 RUE Prone Shoulder ext 3lb x 15 RUE Swiffer  sweeping with R UE x 20 each flex and scap  Standing lat pull down blue TB x 10    Combo US /ESU to L upper trap/levator Tp:   100% US  x 1.5 W/cm2 with Hi-Volt ESU machine default settings x 120 mV x 8' Cervical traction 25lb max pull, 60/20 cycles, x 10 min 07/26/24:  UBE L1.0 x 6 min Therex: seated rows hands vertical 15#  x 15 Seated rows hand horizontal 15# x 15 Standing with back to wall for serving tray B UE with 1# wts 15 reps Standing scaption with 1 # wts  15 reps Standing open books 15 x each side  Combo US /ESU to L upper trap/levator Tp:   100% US  x 1.5 W/cm2 with Hi-Volt ESU machine default settings x 120 mV x 8' Cervical traction 25lb max pull, 60/20 cycles, x 10 min 07/18/24 UBE L1.0 x 6 min Standing rows RTB x 30- muscles more shaky today Standing shoulder ext RTB x 30 Standing horiz ABD RTB anchored to door x 15 Standing B ER RTB x 20 Standing chin tuck x 10 Standing shoulder flex and scap x 1lb each w/ chin tuck Wall angels x 10  Open books standing x 10 B Cervical traction 25lb max pull, 60/20 cycles, x 10 min  07/12/24  THERAPEUTIC EXERCISE: To improve strength.  Demonstration, verbal and tactile cues throughout for technique. UBE L2 x 6' Back  Rowing GTB x 30 BUE Rowing GTB x 30 RUE only Foam roll Shoulder ER RTB x 30 BUE Foam roll shoulder HABD RTB x 30 BUE Foam roll scapular depression Prone HABD  x 20 BUE Prone LT raises x 20 alternating BUE L Cervical SB stretch x 1' x 2  POE w/ cervical retraction x 20 Prone grade 3-4 upper thoracic P/A mobilization to T1-T4 x 10-20 ea level  Combo US /ESU to L upper trap/levator Tp:   100% US  x 1.5 W/cm2 with Hi-Volt ESU machine default settings x 120 mV x 8'  Intermittent Cervical Traction 60 sec hold/20 sec release @ 22 lb pull x 15'  07/09/24 THERAPEUTIC EXERCISE: To improve strength and endurance.  Demonstration, verbal and tactile cues throughout for technique. UBE L2 x 5' back THERAPEUTIC ACTIVITIES: To  improve functional performance.  Demonstration, verbal and tactile cues throughout for technique. B shoulder extension RTB x 20 B rowing Blue TB x 20 Doorway B shoulder ER RTB x 20 Doorway B shoulder HABD RTB x 20 Corner pec stretch x 1' x 2 Foam roll lying palms up x 2' Foam roll with alternate shoulder flexion x 20 BUE Foam roll w/ scapular depression x 20 BUE Seated L cervical SB stretch x 1' x 2   Combo US /ESU to L upper trap/levator Tp:   100% US  x 1.5 W/cm2 with Hi-Volt ESU machine default settings x 120 mV x 10'  07/05/24 Saunders cervical traction x at 12 lb pull x 10' for radicular-like symptoms  MFR, subocciptal release, deep pressure Tp release to R UT and levator Tp x 15'  Combo treatment to L UT/levator area:  100% US  x 1.5 w/cm2 with Hi-Volt estim at 110 mV x 10'  Supine BUE shoulder ER RTB x 20 Supine BUE shoulder HABD RTB x 20 Supine towel cervical traction self assisted--needs teaching from PT for correct angle and correct pull Supine nerve flossing x 20 for RUE median n. Standing nerve flossing x 20 for R median n. Doorway pec/thoracic stretch x 1' x 2 BUE Seated cervical SB stretch x 1' x 2 BUE UBE L1 x 5' backward  07/03/24:  Supine for manual stretching R upper trpas and levator, with outs of manual cervical distraction  Trigger Point Dry Needling  Initial Treatment: Pt instructed on Dry Needling rational, procedures, and possible side effects. Pt instructed to expect mild to moderate muscle soreness later in the day and/or into the next day.  Pt instructed in methods to reduce muscle soreness. Pt instructed to continue prescribed HEP. Because Dry Needling was performed over or adjacent to a lung field, pt was educated on S/S of pneumothorax and to seek immediate medical attention should they occur.  Patient was educated on signs and symptoms of infection and other risk factors and advised to seek medical attention should they occur.  Patient verbalized  understanding of these instructions and education.   Patient Verbal Consent Given: Yes Education Handout Provided: Previously Provided Muscles Treated: r upper traps, R C 6 paraspinals, performed by Eldonna Neuenfeldt, PT, R levator, R rhomboid, R subscap, R middle traps  performed by Elijah Brigitte ALMETA Ivin Iona Performed: No Treatment Response/Outcome: twitch repsonse, decreased muscular resistance   Reviewed home ex program removed the cervical extension, instructed in adapting R levator stretch with self occipital distraction L hand,\ Added B shoulder Er formiddle traps engagement with yellow t band   06/25/24    PATIENT EDUCATION:  Education details: PT eval findings, anticipated POC, progress with PT, ongoing PT POC, initial HEP, postural awareness, posture and body mechanics for typical daily postioning, mobility and household tasks, self-STM techniques to R UT and levator using massage wand, role of TPDN, TPDN  rational, procedure, outcomes, potential side effects, and recommended post-treatment exercises/activity, and fee schedule for TPDN and lack of insurance coverage requiring payment at time of service  Person educated: Patient Education method: Explanation, Demonstration, Verbal cues, Tactile cues, and [email or web address] app access provided Education comprehension: verbal cues required, tactile cues required, and needs further education   HOME EXERCISE PROGRAM: Access Code: [email or web address] URL: [email or web address] Date: 08/02/2024 Prepared by: Braylin Clark  Exercises - Seated Upper Trapezius Stretch  - 1 x daily - 7 x weekly - 2 sets - 2 reps - 1 min hold - Seated Levator Scapulae Stretch  - 1 x daily - 7 x weekly - 2 sets - 1 reps - 1 min hold - Seated Cervical Extension AROM  - 3 x daily - 1 x weekly - 1 sets - 10 reps - 5 hold - Seated Thoracic Lumbar Extension with Pectoralis Stretch  - 3 x daily - 7 x weekly - 1 sets - 10 reps - 5 sec hold - Prone Shoulder Row   - 1 x daily - 7 x weekly - 3 sets - 10 reps - Prone Shoulder Extension - Single Arm with Dumbbell  - 1 x daily - 7 x weekly - 3 sets - 10 reps  ASSESSMENT:  CLINICAL IMPRESSION: Pt showing good ROM today, although reports some tingling in R UE with flexion. Pt reporting tightness along medial scapular border, so we did some mobilizations and STM. She fatigued rather quick in the R shoulder with the periscapular strengthening. She denied traction today as she felt she did not need it.    07/03/24: first Rx session following eval for combined manual techniques, TDN, and stretching.  Tolerated well, no change during session with radicular Sx.  Will continue to assess and progress as appropriate.   Eval:Patient is a 53 y.o. female referred  for physical therapy evaluation and treatment for R neck pain and R trapezius strain by PCP.  She demonstrates RUE weakness and stiffness throughout the R neck, shoulder, and elbow with postural deficits, trigger points in the R UT and levator with pain and also tingling intermittently into the RUE.   Patient is cleared for PT and would benefit from PT to address the above deficits.   She has good rehab potential to decreased her pain and improve her cervical ROM, shoulder/scapular strength and stability and be able to work painfree.   She has had PT here in past for this problem with good relief and is highly motivated to improve her condition.    OBJECTIVE IMPAIRMENTS: decreased knowledge of condition, decreased ROM, decreased strength, increased fascial restrictions, increased muscle spasms, impaired flexibility, impaired sensation, impaired UE functional use, improper body mechanics, postural dysfunction, and pain.   ACTIVITY LIMITATIONS: lifting, sitting, reach over head, and hygiene/grooming  PARTICIPATION LIMITATIONS: cleaning, laundry, and occupation  PERSONAL FACTORS: Profession and 1-2 comorbidities: ACDF, OA, migraines, HTN  are also affecting patient's  functional outcome.   REHAB POTENTIAL: Good  CLINICAL DECISION MAKING: Evolving/moderate complexity  EVALUATION COMPLEXITY: Moderate   GOALS: Goals reviewed with patient? Yes  SHORT TERM GOALS: Target date: 07/24/24  Patient will be independent with initial HEP.  Baseline: 100% PT assistance required for correct completion  Goal status: INITIAL  2.  Patient will report 50% subjective improvement in pain as indicated by 4/10 worst pain in the neck/R UT   Baseline: 8/10 worst  Goal status: MET- 07/18/24    LONG TERM GOALS: Target date: 08/21/24   Patient will be  independent with advanced/ongoing HEP to improve outcomes and carryover.  Baseline: 100% PT assistance required for initial HEP, nothing advanced yet Goal status: INITIAL  2.  Patient will report 100% improvement in R shoulder pain to improve QOL.  Baseline: 8/10 worst pain  Goal status: INITIAL  3.  Patient to improve R shoulder AROM to [facility] without pain provocation to allow for increased ease of ADLs.  Baseline: 55 degrees R shoulder ER  Goal status: IN PROGRESS- 08/02/24 good ROM but avoids resisted OH reaching d/t pain  4.  Patient will demonstrate improved functional UE strength as demonstrated by 5/5 R shoulder strength for ability to do haircare and household and recreational activities without needing rest . Baseline: unable to complete without stopping for rest Goal status: INITIAL  5.  Patient will report 10 points improvement on QuickDash to demonstrate improved functional ability.  Baseline: 34.1 Goal status: INITIAL   PLAN:  PT FREQUENCY: 2x/week  PT DURATION: 8 weeks  PLANNED INTERVENTIONS: 97164- PT Re-evaluation, 97750- Physical Performance Testing, 97110-Therapeutic exercises, 97530- Therapeutic activity, [id number]- Neuromuscular re-education, 97535- Self Care, [id number]- Manual therapy, [id number]- Electrical stimulation (unattended), 97016- Vasopneumatic device, 97035- Ultrasound, 02966- Ionotophoresis 4mg /ml  Dexamethasone , Patient/Family education, Taping, Joint mobilization, Spinal mobilization, DME instructions, Cryotherapy, Moist heat, and Biofeedback  PLAN FOR NEXT SESSION: continue postural strengthening and thoracic mobilization; traction for N/T    Delois Silvester L Aeryn Medici, PT 08/07/2024, 1:04 PM

## 2024-08-14 ENCOUNTER — Ambulatory Visit

## 2024-08-14 ENCOUNTER — Other Ambulatory Visit: Payer: Self-pay

## 2024-08-14 DIAGNOSIS — R293 Abnormal posture: Secondary | ICD-10-CM | POA: Diagnosis not present

## 2024-08-14 DIAGNOSIS — M6281 Muscle weakness (generalized): Secondary | ICD-10-CM

## 2024-08-14 DIAGNOSIS — M542 Cervicalgia: Secondary | ICD-10-CM

## 2024-08-14 DIAGNOSIS — M25611 Stiffness of right shoulder, not elsewhere classified: Secondary | ICD-10-CM

## 2024-08-14 DIAGNOSIS — M436 Torticollis: Secondary | ICD-10-CM | POA: Diagnosis not present

## 2024-08-14 NOTE — Therapy (Signed)
 OUTPATIENT PHYSICAL THERAPY SHOULDER TREATMENT   Patient Name: OFELIA PODOLSKI MRN: [id number] DOB:1971-01-11, 53 y.o., female Today's Date: 08/14/2024  END OF SESSION:  PT End of Session - 08/14/24 0901     Visit Number 9    Date for PT Re-Evaluation 08/21/24    Authorization Type [employer] commercial    Progress Note Due on Visit 10    PT Start Time [phone]    PT Stop Time 0932    PT Time Calculation (min) 41 min    Activity Tolerance Patient tolerated treatment well;No increased pain    Behavior During Therapy WFL for tasks assessed/performed                Past Medical History:  Diagnosis Date   Acne    Anemia    history of   Anxiety    Arthritis    Depression    GERD (gastroesophageal reflux disease)    worse while pregnant   Headache    Migraines   Heart murmur    ? heart murmur in past   History of blood transfusion 2013   Hypertension    Metabolic syndrome    Past Surgical History:  Procedure Laterality Date   ANTERIOR FUSION CERVICAL SPINE  2018   CESAREAN SECTION     x 2   CESAREAN SECTION  09/29/2012   Procedure: CESAREAN SECTION;  Surgeon: Alm JAYSON Cook, MD;  Location: [facility];  Service: Obstetrics;  Laterality: N/A;  Repeat edc 10/12/12/REQUEST;Chassity,Dee,Colleen   CHOLECYSTECTOMY N/A 02/26/2024   Procedure: LAPAROSCOPIC CHOLECYSTECTOMY  INTRAOPERATIVE CHOLANGIOGRAM;  Surgeon: Sheldon Standing, MD;  Location: [facility];  Service: General;  Laterality: N/A;   COLONOSCOPY     ESOPHAGOGASTRODUODENOSCOPY  normal    7/12   JOINT REPLACEMENT Bilateral    LAPAROSCOPIC VAGINAL HYSTERECTOMY WITH SALPINGECTOMY Bilateral 02/23/2016   Procedure: LAPAROSCOPIC ASSISTED VAGINAL HYSTERECTOMY WITH bilateral SALPINGECTOMY, right oophorectomy. laporotic repair of incidental cystotomy.;  Surgeon: Alm Cook, MD;  Location: [facility];  Service: Gynecology;  Laterality: Bilateral;   MOUTH SURGERY     Patient Active Problem List   Diagnosis Date Noted   HTN (hypertension)  06/11/2024   Obesity (BMI 30-39.9) 06/11/2024   AKI (acute kidney injury) (HCC) 06/09/2024   Acute on chronic cholecystitis 02/25/2024   Cholecystitis 02/25/2024   Left hip pain 08/27/2019   Class 1 obesity due to excess calories without serious comorbidity with body mass index (BMI) of 34.0 to 34.9 in adult 11/28/2018   Heart palpitations 11/28/2018   OSA (obstructive sleep apnea) 09/14/2018   Neck pain 04/26/2018   Wellness examination 03/08/2016   S/P laparoscopic assisted vaginal hysterectomy (LAVH) 02/23/2016   Pink eye 06/19/2015   Pain of right thumb 06/19/2015   Sinusitis, acute frontal 02/05/2015   Headache around the eyes 02/05/2015   Back pain 11/01/2014   Routine general medical examination at a health care facility 10/30/2013   Vesicular rash 09/07/2013   Sinusitis 02/02/2013   Viral pharyngitis 03/03/2012   Flank pain 08/13/2011   Inguinal adenopathy 08/13/2011   Dysphagia 04/20/2011   GERD 01/29/2011   FATIGUE 01/29/2011   SCIATICA, BILATERAL 06/18/2010   LEG PAIN, BILATERAL 06/06/2010   HIP PAIN, LEFT 04/01/2010   MASS, LOCALIZED, SUPERFICIAL 03/05/2009   ACNE VULGARIS 07/11/2007   MURMUR 07/11/2007    PCP: Dorina Dallas RIGGERS   REFERRING PROVIDER: Dorina Dallas, PA-C   REFERRING DIAG: M25.511 (ICD-10-CM) - Acute pain of right shoulderS46.811A (ICD-10-CM) - Strain of right trapezius muscle, initial  encounter  THERAPY DIAG:  Neck pain  Muscle weakness (generalized)  Abnormal posture  Cervicalgia  Neck stiffness  Stiffness of right shoulder, not elsewhere classified  Rationale for Evaluation and Treatment: Rehabilitation  ONSET DATE: last 1-2 months   NEXT MD VISIT:  no f/u for neck pain unless needed;  PET scan 07/04/24 per oncologist SUBJECTIVE:                                                                                                                                                                                      SUBJECTIVE  STATEMENT:   Pt reports to have MRI this week on Thursday.  Still pins zeb R distal hand, fingers Wants strengthening ex today  EVAL:  Patient presents to PT for R sided cervical/upper trap pain that has increased over the last 1-2 months for no apparent reason.  She denies any trauma or MOI.  She  also reports tingling sensation into the R arm into her fingers (all fingers) intermittently.  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   Reports has 3 computer monitors that she views throughout the workday.  She does not have an elevating work station that would allow her to stand and work.    States pain usually better in the AM and is worse  as the workday goes on.   States feels better with correct positioning and cross body shoulder HADD stretching.  She does not recall her HEP from therapy here a year ago, but states that it helped when she was doing it previously.   Of note, Patient was hospitalized in 3/25 for Abd pain, cholecystitis, and cholecystectomy.   She was hospitalized again 2 weeks ago for abdominal pain.  Abdominal CT demonstrated perirenal stranding and she has f/u with oncology and is scheduled for PET scan soon to r/o CA.    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 Yes: NPRS scale: 2/10 now, 8/10 worst Pain location: R UT  Pain description: aching, intermittent pain in RUT and intermittent tingling into the RUE into the fingers (all fingers).   Aggravating factors: working and sitting thru the day at computer, [employer] Relieving factors: stretching neck into sidebending  PRECAUTIONS: h/o ACDF  RED FLAGS: None   WEIGHT BEARING RESTRICTIONS: No  FALLS:  Has patient fallen in last 6 months? 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  DIAGNOSTIC FINDINGS:  N/a  PATIENT SURVEYS:  Quick Dash: 34.1%  COGNITION: Overall cognitive status: Within functional limits for tasks assessed     SENSATION: WFL  POSTURE: Forward head rounded shoulders   UPPER EXTREMITY ROM:   Active ROM Right eval Left eval R 08/02/24  Shoulder flexion 180 p 180 180- mild tingling  Shoulder extension 55 60   Shoulder abduction 175 p 175 170  Shoulder adduction Reach to opposite shoulder Reach to opposite shoulder   Shoulder internal rotation To L1 To T10 T7  Shoulder external rotation To C7 p To T2 To T3  Elbow flexion     Elbow extension     Wrist flexion     Wrist extension     Wrist ulnar deviation     Wrist radial deviation     Wrist pronation     Wrist supination     (Blank rows = not tested)  UPPER EXTREMITY MMT:  MMT Right eval Left eval  Shoulder flexion 4   p 5  Shoulder extension 4- 4+  Shoulder abduction 4 p 4+  Shoulder adduction    Shoulder internal rotation 4 4+  Shoulder external rotation 4- 4  Middle trapezius    Lower trapezius    Elbow flexion 4 5  Elbow extension 4 4  Wrist flexion    Wrist extension    Wrist ulnar deviation    Wrist radial deviation    Wrist pronation    Wrist supination    Grip strength (lbs) 10 kg 8 kg  (Blank rows = not tested)  SHOULDER SPECIAL TESTS: Impingement tests: Hawkins/Kennedy impingement test: positive  SLAP lesions: NT Instability tests: NT Rotator cuff assessment: Infraspinatus test: negative Biceps assessment: NT  NECK SPECIAL TESTS:  Spurling's is negative bilaterally.  Compression  is negative, distraction feel good, but no impact on RUE symptoms  JOINT MOBILITY TESTING:  WNL for R shoulder    PALPATION:  TTP over R UT and levator with trigger points in both                                                                                                                             TREATMENT DATE:  08/14/24: therex:  Nustep L 6 UE's and LE's x 6 min Side lying for shoulder ER R 1#, L 2 # 20 reps Side lying for thoracic rotation, open books 15 reps each side Seated for chest press hands horizontal 5# 15 reps Seated rows lower hands 15 reps 15# Seated rows high hands 15 reps 15 # Prone for shoulder ext 3# L, 0# R 20 reps Prone  horizontal shoulder abd 0# each 20 reps  08/02/24 UBE L1.5 x 6 min Scapular mobs R side all directions and circles each way STM to R rhomboids, thoracic PS in prone Prone rows 3lb x 20 RUE Prone Shoulder ext 3lb x 15 RUE Swiffer sweeping with R UE x 20 each flex and scap  Standing lat pull down blue TB x 10    Combo US /ESU to L upper trap/levator Tp:   100% US  x 1.5 W/cm2 with Hi-Volt ESU machine default settings x 120 mV x 8' Cervical traction 25lb max pull, 60/20 cycles, x 10 min 07/26/24:  UBE L1.0 x 6 min Therex: seated rows hands vertical 15#  x 15 Seated rows hand horizontal 15# x 15 Standing with back to wall for serving tray B UE with 1# wts 15 reps Standing scaption with 1 # wts  15 reps Standing open books 15 x each side  Combo US /ESU to L upper trap/levator Tp:   100% US  x 1.5 W/cm2 with Hi-Volt ESU machine default settings x 120 mV x 8' Cervical traction 25lb max pull, 60/20 cycles, x 10 min 07/18/24 UBE L1.0 x 6 min Standing rows RTB x 30- muscles more shaky today Standing shoulder ext RTB x 30 Standing horiz ABD RTB anchored to door x 15 Standing B ER RTB x 20 Standing chin tuck x 10 Standing shoulder flex and scap x 1lb each w/ chin tuck Wall angels x 10  Open books standing x 10 B Cervical traction 25lb max  pull, 60/20 cycles, x 10 min  07/12/24  THERAPEUTIC EXERCISE: To improve strength.  Demonstration, verbal and tactile cues throughout for technique. UBE L2 x 6' Back  Rowing GTB x 30 BUE Rowing GTB x 30 RUE only Foam roll Shoulder ER RTB x 30 BUE Foam roll shoulder HABD RTB x 30 BUE Foam roll scapular depression Prone HABD x 20 BUE Prone LT raises x 20 alternating BUE L Cervical SB stretch x 1' x 2  POE w/ cervical retraction x 20 Prone grade 3-4 upper thoracic P/A mobilization to T1-T4 x 10-20 ea level  Combo US /ESU to L upper trap/levator Tp:   100% US  x 1.5 W/cm2 with Hi-Volt ESU machine default settings x 120 mV x 8'  Intermittent Cervical Traction 60 sec hold/20 sec release @ 22 lb pull x 15'  07/09/24 THERAPEUTIC EXERCISE: To improve strength and endurance.  Demonstration, verbal and tactile cues throughout for technique. UBE L2 x 5' back THERAPEUTIC ACTIVITIES: To improve functional performance.  Demonstration, verbal and tactile cues throughout for technique. B shoulder extension RTB x 20 B rowing Blue TB x 20 Doorway B shoulder ER RTB x 20 Doorway B shoulder HABD RTB x 20 Corner pec stretch x 1' x 2 Foam roll lying palms up x 2' Foam roll with alternate shoulder flexion x 20 BUE Foam roll w/ scapular depression x 20 BUE Seated L cervical SB stretch x 1' x 2   Combo US /ESU to L upper trap/levator Tp:   100% US  x 1.5 W/cm2 with Hi-Volt ESU machine default settings x 120 mV x 10'  07/05/24 Saunders cervical traction x at 12 lb pull x 10' for radicular-like symptoms  MFR, subocciptal release, deep pressure Tp release to R UT and levator Tp x 15'  Combo treatment to L UT/levator area:  100% US  x 1.5 w/cm2 with Hi-Volt estim at 110 mV x 10'  Supine BUE shoulder ER RTB x 20 Supine BUE shoulder HABD RTB x 20  Supine towel cervical traction self assisted--needs teaching from PT for correct angle and correct pull Supine nerve flossing x 20 for RUE median n. Standing nerve  flossing x 20 for R median n. Doorway pec/thoracic stretch x 1' x 2 BUE Seated cervical SB stretch x 1' x 2 BUE UBE L1 x 5' backward  07/03/24:  Supine for manual stretching R upper trpas and levator, with outs of manual cervical distraction  Trigger Point Dry Needling  Initial Treatment: Pt instructed on Dry Needling rational, procedures, and possible side effects. Pt instructed to expect mild to moderate muscle soreness later in the day and/or into the next day.  Pt instructed in methods to reduce muscle soreness. Pt instructed to continue prescribed HEP. Because Dry Needling was performed over or adjacent to a lung field, pt was educated on S/S of pneumothorax and to seek immediate medical attention should they occur.  Patient was educated on signs and symptoms of infection and other risk factors and advised to seek medical attention should they occur.  Patient verbalized understanding of these instructions and education.   Patient Verbal Consent Given: Yes Education Handout Provided: Previously Provided Muscles Treated: r upper traps, R C 6 paraspinals, performed by Tawn Fitzner, PT, R levator, R rhomboid, R subscap, R middle traps  performed by Elijah Brigitte ALMETA Ivin Iona Performed: No Treatment Response/Outcome: twitch repsonse, decreased muscular resistance   Reviewed home ex program removed the cervical extension, instructed in adapting R levator stretch with self occipital distraction L hand,\ Added B shoulder Er formiddle traps engagement with yellow t band   06/25/24    PATIENT EDUCATION:  Education details: PT eval findings, anticipated POC, progress with PT, ongoing PT POC, initial HEP, postural awareness, posture and body mechanics for typical daily postioning, mobility and household tasks, self-STM techniques to R UT and levator using massage wand, role of TPDN, TPDN rational, procedure, outcomes, potential side effects, and recommended post-treatment  exercises/activity, and fee schedule for TPDN and lack of insurance coverage requiring payment at time of service  Person educated: Patient Education method: Explanation, Demonstration, Verbal cues, Tactile cues, and [email or web address] app access provided Education comprehension: verbal cues required, tactile cues required, and needs further education   HOME EXERCISE PROGRAM: Access Code: [email or web address] URL: [email or web address] Date: 08/02/2024 Prepared by: Braylin Clark  Exercises - Seated Upper Trapezius Stretch  - 1 x daily - 7 x weekly - 2 sets - 2 reps - 1 min hold - Seated Levator Scapulae Stretch  - 1 x daily - 7 x weekly - 2 sets - 1 reps - 1 min hold - Seated Cervical Extension AROM  - 3 x daily - 1 x weekly - 1 sets - 10 reps - 5 hold - Seated Thoracic Lumbar Extension with Pectoralis Stretch  - 3 x daily - 7 x weekly - 1 sets - 10 reps - 5 sec hold - Prone Shoulder Row  - 1 x daily - 7 x weekly - 3 sets - 10 reps - Prone Shoulder Extension - Single Arm with Dumbbell  - 1 x daily - 7 x weekly - 3 sets - 10 reps  ASSESSMENT:  CLINICAL IMPRESSION: Again today we focused on strengthening, engagement of postural musculature thoracic and periscapular regions. She tolerated well, performed several in prone for gravity resisted specific training.  Will have her MRI this week.  She is scheduled with our clinic for one more week.    07/03/24: first Rx session following eval for combined manual techniques, TDN,  and stretching.  Tolerated well, no change during session with radicular Sx.  Will continue to assess and progress as appropriate.   Eval:Patient is a 53 y.o. female referred  for physical therapy evaluation and treatment for R neck pain and R trapezius strain by PCP.  She demonstrates RUE weakness and stiffness throughout the R neck, shoulder, and elbow with postural deficits, trigger points in the R UT and levator with pain and also tingling intermittently into the RUE.   Patient  is cleared for PT and would benefit from PT to address the above deficits.   She has good rehab potential to decreased her pain and improve her cervical ROM, shoulder/scapular strength and stability and be able to work painfree.   She has had PT here in past for this problem with good relief and is highly motivated to improve her condition.    OBJECTIVE IMPAIRMENTS: decreased knowledge of condition, decreased ROM, decreased strength, increased fascial restrictions, increased muscle spasms, impaired flexibility, impaired sensation, impaired UE functional use, improper body mechanics, postural dysfunction, and pain.   ACTIVITY LIMITATIONS: lifting, sitting, reach over head, and hygiene/grooming  PARTICIPATION LIMITATIONS: cleaning, laundry, and occupation  PERSONAL FACTORS: Profession and 1-2 comorbidities: ACDF, OA, migraines, HTN  are also affecting patient's functional outcome.   REHAB POTENTIAL: Good  CLINICAL DECISION MAKING: Evolving/moderate complexity  EVALUATION COMPLEXITY: Moderate   GOALS: Goals reviewed with patient? Yes  SHORT TERM GOALS: Target date: 07/24/24  Patient will be independent with initial HEP.  Baseline: 100% PT assistance required for correct completion  Goal status: INITIAL  2.  Patient will report 50% subjective improvement in pain as indicated by 4/10 worst pain in the neck/R UT   Baseline: 8/10 worst  Goal status: MET- 07/18/24    LONG TERM GOALS: Target date: 08/21/24   Patient will be independent with advanced/ongoing HEP to improve outcomes and carryover.  Baseline: 100% PT assistance required for initial HEP, nothing advanced yet Goal status: INITIAL  2.  Patient will report 100% improvement in R shoulder pain to improve QOL.  Baseline: 8/10 worst pain  Goal status: INITIAL  3.  Patient to improve R shoulder AROM to [facility] without pain provocation to allow for increased ease of ADLs.  Baseline: 55 degrees R shoulder ER  Goal status: IN PROGRESS-  08/02/24 good ROM but avoids resisted OH reaching d/t pain  4.  Patient will demonstrate improved functional UE strength as demonstrated by 5/5 R shoulder strength for ability to do haircare and household and recreational activities without needing rest . Baseline: unable to complete without stopping for rest Goal status: INITIAL  5.  Patient will report 10 points improvement on QuickDash to demonstrate improved functional ability.  Baseline: 34.1 Goal status: INITIAL   PLAN:  PT FREQUENCY: 2x/week  PT DURATION: 8 weeks  PLANNED INTERVENTIONS: 97164- PT Re-evaluation, 97750- Physical Performance Testing, 97110-Therapeutic exercises, 97530- Therapeutic activity, [id number]- Neuromuscular re-education, 97535- Self Care, [id number]- Manual therapy, [id number]- Electrical stimulation (unattended), 97016- Vasopneumatic device, [id number]- Ultrasound, 02966- Ionotophoresis 4mg /ml Dexamethasone , Patient/Family education, Taping, Joint mobilization, Spinal mobilization, DME instructions, Cryotherapy, Moist heat, and Biofeedback  PLAN FOR NEXT SESSION: continue postural strengthening and thoracic mobilization; traction for N/T    Hadassah Rana L Carman Essick, PT, DPT, OCS 08/14/2024, 12:08 PM

## 2024-08-16 ENCOUNTER — Ambulatory Visit: Admitting: Rehabilitation

## 2024-08-20 ENCOUNTER — Encounter: Payer: Self-pay | Admitting: Rehabilitation

## 2024-08-20 ENCOUNTER — Ambulatory Visit: Admitting: Rehabilitation

## 2024-08-20 DIAGNOSIS — R293 Abnormal posture: Secondary | ICD-10-CM

## 2024-08-20 DIAGNOSIS — M25611 Stiffness of right shoulder, not elsewhere classified: Secondary | ICD-10-CM | POA: Diagnosis not present

## 2024-08-20 DIAGNOSIS — M436 Torticollis: Secondary | ICD-10-CM

## 2024-08-20 DIAGNOSIS — M542 Cervicalgia: Secondary | ICD-10-CM | POA: Diagnosis not present

## 2024-08-20 DIAGNOSIS — M6281 Muscle weakness (generalized): Secondary | ICD-10-CM

## 2024-08-20 NOTE — Therapy (Signed)
 OUTPATIENT PHYSICAL THERAPY SHOULDER TREATMENT / RECERTIFICATION    Patient Name: Ann Frederick MRN: [id number] DOB:October 20, 1971, 53 y.o., female Today's Date: 08/20/2024  END OF SESSION:  PT End of Session - 08/20/24 1407     Visit Number 10    Date for PT Re-Evaluation 08/21/24    Authorization Type [employer] commercial    Progress Note Due on Visit 10    PT Start Time 1405    PT Stop Time 1446    PT Time Calculation (min) 41 min    Activity Tolerance Patient tolerated treatment well;No increased pain    Behavior During Therapy WFL for tasks assessed/performed                Past Medical History:  Diagnosis Date   Acne    Anemia    history of   Anxiety    Arthritis    Depression    GERD (gastroesophageal reflux disease)    worse while pregnant   Headache    Migraines   Heart murmur    ? heart murmur in past   History of blood transfusion 2013   Hypertension    Metabolic syndrome    Past Surgical History:  Procedure Laterality Date   ANTERIOR FUSION CERVICAL SPINE  2018   CESAREAN SECTION     x 2   CESAREAN SECTION  09/29/2012   Procedure: CESAREAN SECTION;  Surgeon: Alm JAYSON Cook, MD;  Location: [facility];  Service: Obstetrics;  Laterality: N/A;  Repeat edc 10/12/12/REQUEST;Chassity,Dee,Colleen   CHOLECYSTECTOMY N/A 02/26/2024   Procedure: LAPAROSCOPIC CHOLECYSTECTOMY  INTRAOPERATIVE CHOLANGIOGRAM;  Surgeon: Sheldon Standing, MD;  Location: [facility];  Service: General;  Laterality: N/A;   COLONOSCOPY     ESOPHAGOGASTRODUODENOSCOPY  normal    7/12   JOINT REPLACEMENT Bilateral    LAPAROSCOPIC VAGINAL HYSTERECTOMY WITH SALPINGECTOMY Bilateral 02/23/2016   Procedure: LAPAROSCOPIC ASSISTED VAGINAL HYSTERECTOMY WITH bilateral SALPINGECTOMY, right oophorectomy. laporotic repair of incidental cystotomy.;  Surgeon: Alm Cook, MD;  Location: [facility];  Service: Gynecology;  Laterality: Bilateral;   MOUTH SURGERY     Patient Active Problem List   Diagnosis Date Noted   HTN  (hypertension) 06/11/2024   Obesity (BMI 30-39.9) 06/11/2024   AKI (acute kidney injury) (HCC) 06/09/2024   Acute on chronic cholecystitis 02/25/2024   Cholecystitis 02/25/2024   Left hip pain 08/27/2019   Class 1 obesity due to excess calories without serious comorbidity with body mass index (BMI) of 34.0 to 34.9 in adult 11/28/2018   Heart palpitations 11/28/2018   OSA (obstructive sleep apnea) 09/14/2018   Neck pain 04/26/2018   Wellness examination 03/08/2016   S/P laparoscopic assisted vaginal hysterectomy (LAVH) 02/23/2016   Pink eye 06/19/2015   Pain of right thumb 06/19/2015   Sinusitis, acute frontal 02/05/2015   Headache around the eyes 02/05/2015   Back pain 11/01/2014   Routine general medical examination at a health care facility 10/30/2013   Vesicular rash 09/07/2013   Sinusitis 02/02/2013   Viral pharyngitis 03/03/2012   Flank pain 08/13/2011   Inguinal adenopathy 08/13/2011   Dysphagia 04/20/2011   GERD 01/29/2011   FATIGUE 01/29/2011   SCIATICA, BILATERAL 06/18/2010   LEG PAIN, BILATERAL 06/06/2010   HIP PAIN, LEFT 04/01/2010   MASS, LOCALIZED, SUPERFICIAL 03/05/2009   ACNE VULGARIS 07/11/2007   MURMUR 07/11/2007    PCP: Dorina Dallas RIGGERS   REFERRING PROVIDER: Dorina Dallas, PA-C   REFERRING DIAG: M25.511 (ICD-10-CM) - Acute pain of right shoulderS46.811A (ICD-10-CM) - Strain of right  trapezius muscle, initial encounter  THERAPY DIAG:  Neck pain  Muscle weakness (generalized)  Abnormal posture  Cervicalgia  Neck stiffness  Stiffness of right shoulder, not elsewhere classified  Rationale for Evaluation and Treatment: Rehabilitation  ONSET DATE: last 1-2 months   NEXT MD VISIT:  no f/u for neck pain unless needed;  PET scan 07/04/24 per oncologist SUBJECTIVE:                                                                                                                                                                                       SUBJECTIVE STATEMENT:   Patient states she feels numbness/tingling/paresthesias in BUE now, but it is only about 15% of her day.   She initially was only having the paresthesias in the RUE, but now is getting it in the LUE intermittently.   She feels overall improved with pain and only has 1/10 pain today.   She was supposed to have an MRI but was unable due to claustrophobia.  Hopes to have an open MRI rescheduled.  She declines cervical traction today and prefers just to do strengthening exercise  EVAL:  Patient presents to PT for R sided cervical/upper trap pain that has increased over the last 1-2 months for no apparent reason.  She denies any trauma or MOI.  She  also reports tingling sensation into the R arm into her fingers (all fingers) intermittently.  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   Reports has 3 computer monitors that she views throughout the workday.  She does not have an elevating work station that would allow her to stand and work.    States pain usually better in the AM and is worse  as the workday goes on.   States feels better with correct positioning and cross body shoulder HADD stretching.  She does not recall her HEP from therapy here a year ago, but states that it helped when she was doing it previously.   Of note, Patient was hospitalized in 3/25 for Abd pain, cholecystitis, and cholecystectomy.   She was hospitalized again 2 weeks ago for abdominal pain.  Abdominal CT demonstrated perirenal stranding and she has f/u with oncology and is scheduled for PET scan soon to r/o CA.    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 Yes: NPRS scale: 2/10 now, 8/10 worst Pain location: R UT  Pain description: aching, intermittent pain  in RUT  and intermittent tingling into the RUE into the fingers (all fingers).   Aggravating factors: working and sitting thru the day at computer, [employer] Relieving factors: stretching neck into sidebending  PRECAUTIONS: h/o ACDF  RED FLAGS: None   WEIGHT BEARING RESTRICTIONS: No  FALLS:  Has patient fallen in last 6 months? 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  DIAGNOSTIC FINDINGS:  N/a  PATIENT SURVEYS:  Quick Dash: 34.1%  COGNITION: Overall cognitive status: Within functional limits for tasks assessed     SENSATION: WFL  POSTURE: Forward head rounded shoulders   CERVICAL ROM: 08/20/24  Active ROM A/PROM  08/20/24  Flexion 85%  Extension 75%  Right lateral flexion 50%  Left lateral flexion 60%  Right rotation 75%  Left rotation 75%   (Blank rows = not tested)   UPPER EXTREMITY ROM:   Active ROM Right eval Left eval R 08/02/24 RUE 08/20/24  Shoulder flexion 180 p 180 180- mild tingling 180  Shoulder extension 55 60    Shoulder abduction 175 p 175 170 175  Shoulder adduction Reach to opposite shoulder Reach to opposite shoulder    Shoulder internal rotation To L1 To T10 T7   Shoulder external rotation To C7 p To T2 To T3   Elbow flexion      Elbow extension      Wrist flexion      Wrist extension      Wrist ulnar deviation      Wrist radial deviation      Wrist pronation      Wrist supination      (Blank rows = not tested)  UPPER EXTREMITY MMT:  MMT Right eval Left eval RUE 08/20/24 LUE  08/20/24  Shoulder flexion 4   p 5 5 5   Shoulder extension 4- 4+    Shoulder abduction 4 p 4+  5 5  Shoulder adduction      Shoulder internal rotation 4 4+ 5 5  Shoulder external rotation 4- 4 4 4+  Middle trapezius    4  Lower trapezius    4-  Elbow flexion 4 5    Elbow extension 4 4    Wrist flexion      Wrist extension      Wrist ulnar deviation      Wrist radial deviation      Wrist pronation      Wrist supination      Grip strength (lbs) 10 kg 8 kg    (Blank rows = not tested)  SHOULDER SPECIAL TESTS: Impingement tests: Hawkins/Kennedy impingement test: positive  SLAP lesions: NT Instability tests: NT Rotator cuff assessment: Infraspinatus test: negative Biceps assessment: NT  NECK SPECIAL TESTS:  Spurling's is negative bilaterally.  Compression is negative, distraction feel good, but no impact on RUE symptoms  JOINT MOBILITY TESTING:  WNL for R shoulder    PALPATION:  TTP over R UT and levator with trigger points in both  TREATMENT DATE:  08/20/24 THERAPEUTIC EXERCISE: To improve strength.  Demonstration, verbal and tactile cues throughout for technique. UBE L2.5 x 5' F/ #' B  THERAPEUTIC ACTIVITIES: To improve functional performance.  Demonstration, verbal and tactile cues throughout for technique. Prone shoulder ext 2# x 20  Prone shoulder HABD 0# x 20 Prone shoulder LT raise 0# x 20 Lat PD 25# x 20 Rowing blue and black TB together x 30  NEUROMUSCULAR RE-EDUCATION: To improve kinesthesia and posture. Red swiss ball Y's, T's, arrows 0# x 20 BUE  MANUAL THERAPY: To promote reduced pain utilizing manual TP therapy. Tp deep pressure to R rhomboids along medial scapular border  08/14/24: therex:  Nustep L 6 UE's and LE's x 6 min Side lying for shoulder ER R 1#, L 2 # 20 reps Side lying for thoracic rotation, open books 15 reps each side Seated for chest press hands horizontal 5# 15 reps Seated rows lower hands 15 reps  15# Seated rows high hands 15 reps 15 # Prone for shoulder ext 3# L, 0# R 20 reps Prone horizontal shoulder abd 0# each 20 reps  PATIENT EDUCATION:  Education details: Continuing PT x 1w4 with focus on strengthening scapular musculature  Person educated: Patient Education method: Explanation, Demonstration, Verbal cues, Tactile cues, and MedBridgeGO app access provided Education comprehension: verbal cues required, tactile cues required, and needs further education   HOME EXERCISE PROGRAM: Access Code: [email or web address] URL: [email or web address] Date: 08/20/2024 Prepared by: Garnette Montclair  Exercises - Seated Upper Trapezius Stretch  - 1 x daily - 7 x weekly - 2 sets - 2 reps - 1 min hold - Seated Levator Scapulae Stretch  - 1 x daily - 7 x weekly - 2 sets - 1 reps - 1 min hold - Seated Cervical Extension AROM  - 3 x daily - 1 x weekly - 1 sets - 10 reps - 5 hold - Seated Thoracic Lumbar Extension with Pectoralis Stretch  - 3 x daily - 7 x weekly - 1 sets - 10 reps - 5 sec hold - Prone Shoulder Row  - 1 x daily - 7 x weekly - 3 sets - 10 reps - Prone Shoulder Extension - Single Arm with Dumbbell  - 1 x daily - 7 x weekly - 3 sets - 10 reps  ASSESSMENT:  CLINICAL IMPRESSION:  Patient has made good improvement over the last 2 months with PT.  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  She initially was only having the RUE paresthesias to the mid humerus, but is now getting them in BUE running down towards her hands.  She is supposed to get an MRI, but couldn't do the regular mobile MRI due to claustrophobia.  She still has residual weakness in the periscapular musculature that can improve to help her with postural correction thru the day.  She would benefit from ongoing PT x 1w4 for strengthening of this weakness as her pain is much better.   Eval:Patient is a 53 y.o. female referred  for physical therapy evaluation and treatment  for R neck pain and R trapezius strain by PCP.  She demonstrates RUE weakness and stiffness throughout the R neck, shoulder, and elbow with postural deficits, trigger points in the R UT and levator with pain and also tingling intermittently into the RUE.   Patient is cleared for PT and would benefit from PT to address the above deficits.   She has good  rehab potential to decreased her pain and improve her cervical ROM, shoulder/scapular strength and stability and be able to work painfree.   She has had PT here in past for this problem with good relief and is highly motivated to improve her condition.    OBJECTIVE IMPAIRMENTS: decreased knowledge of condition, decreased ROM, decreased strength, increased fascial restrictions, increased muscle spasms, impaired flexibility, impaired sensation, impaired UE functional use, improper body mechanics, postural dysfunction, and pain.   ACTIVITY LIMITATIONS: lifting, sitting, reach over head, and hygiene/grooming  PARTICIPATION LIMITATIONS: cleaning, laundry, and occupation  PERSONAL FACTORS: Profession and 1-2 comorbidities: ACDF, OA, migraines, HTN  are also affecting patient's functional outcome.   REHAB POTENTIAL: Good  CLINICAL DECISION MAKING: Evolving/moderate complexity  EVALUATION COMPLEXITY: Moderate   GOALS: Goals reviewed with patient? Yes  SHORT TERM GOALS: Target date: 07/24/24  Patient will be independent with initial HEP.  Baseline: 100% PT assistance required for correct completion  08/20/24:  can return/demo all HEP Goal status: MET  2.  Patient will report 50% subjective improvement in pain as indicated by 4/10 worst pain in the neck/R UT   Baseline: 8/10 worst  Goal status: MET- 07/18/24    LONG TERM GOALS: Target date: 08/21/24   Patient will be independent with advanced/ongoing HEP to improve outcomes and carryover.  Baseline: 100% PT assistance required for initial HEP, nothing advanced yet 08/20/24:  Patient can teach back  all HEP Goal status: MET  2.  Patient will report 100% improvement in R shoulder pain to improve QOL.  Baseline: 8/10 worst pain 08/20/24:  1/10 worst Goal status: MET  3.  Patient to improve R shoulder AROM to [facility] without pain provocation to allow for increased ease of ADLs.  Baseline: 55 degrees R shoulder ER  Goal status: IN PROGRESS- 08/02/24 good ROM but avoids resisted OH reaching d/t pain  4.  Patient will demonstrate improved functional UE strength as demonstrated by 5/5 R shoulder strength for ability to do haircare and household and recreational activities without needing rest . Baseline: unable to complete without stopping for rest 08/20/24: Strength testing today reveals 4 to 4+ strength in scapular stabilizers of the R shoulder Goal status: IN PROGRESS  5.  Patient will report 10 points improvement on QuickDash to demonstrate improved functional ability.  Baseline: 34.1 08/20/24:  40 Goal status: IN PROGRESS   PLAN:  PT FREQUENCY: 2x/week  PT DURATION: 8 weeks  PLANNED INTERVENTIONS: 97164- PT Re-evaluation, 97750- Physical Performance Testing, 97110-Therapeutic exercises, 97530- Therapeutic activity, 97112- Neuromuscular re-education, 97535- Self Care, [id number]- Manual therapy, [id number]- Electrical stimulation (unattended), 97016- Vasopneumatic device, 97035- Ultrasound, [id number]- Ionotophoresis 4mg /ml Dexamethasone , Patient/Family education, Taping, Joint mobilization, Spinal mobilization, DME instructions, Cryotherapy, Moist heat, and Biofeedback  PLAN FOR NEXT SESSION: continue postural strengthening and thoracic mobilization; traction for N/T    Jodilyn Giese, PT, DPT, OCS 08/20/2024, 9:41 PM

## 2024-08-21 ENCOUNTER — Encounter: Payer: Self-pay | Admitting: Medical

## 2024-08-21 ENCOUNTER — Encounter: Payer: Self-pay | Admitting: Neurology

## 2024-08-21 ENCOUNTER — Other Ambulatory Visit: Payer: Self-pay

## 2024-08-21 DIAGNOSIS — R202 Paresthesia of skin: Secondary | ICD-10-CM

## 2024-08-21 NOTE — Telephone Encounter (Signed)
 Placed in red folder

## 2024-08-23 ENCOUNTER — Other Ambulatory Visit: Payer: Self-pay | Admitting: Medical

## 2024-08-24 ENCOUNTER — Telehealth: Payer: Self-pay

## 2024-08-24 ENCOUNTER — Ambulatory Visit (INDEPENDENT_AMBULATORY_CARE_PROVIDER_SITE_OTHER): Admitting: Medical

## 2024-08-24 ENCOUNTER — Encounter: Payer: Self-pay | Admitting: Medical

## 2024-08-24 VITALS — BP 130/78 | HR 78 | Temp 97.6°F | Resp 16 | Ht 62.0 in | Wt 168.4 lb

## 2024-08-24 DIAGNOSIS — E669 Obesity, unspecified: Secondary | ICD-10-CM

## 2024-08-24 DIAGNOSIS — I10 Essential (primary) hypertension: Secondary | ICD-10-CM | POA: Diagnosis not present

## 2024-08-24 DIAGNOSIS — M5412 Radiculopathy, cervical region: Secondary | ICD-10-CM

## 2024-08-24 MED ORDER — WEGOVY 0.5 MG/0.5ML ~~LOC~~ SOAJ: 0.5000 mg | SUBCUTANEOUS | 0 refills | Status: DC

## 2024-08-24 NOTE — Telephone Encounter (Signed)
 Faxed off disability paperwork

## 2024-08-24 NOTE — Progress Notes (Signed)
 Subjective:    Patient ID: Ann Frederick, female    DOB: 1971/05/04, 53 y.o.   MRN: [id number]  HPI   Pt in for follow up. Pt sent me my chart message.   I wanted to provide some context before [employer] sends over the FMLA paperwork so you'll have a clear picture of what's been going on and what I anticipate needing.   Over the past several months, I've been attending physical therapy consistently. While I've seen some progress, the recovery times after certain sessions can be very intense. When I have dry needling, heavier stretching, or strengthening exercises, I often experience significant pain or fatigue that lasts into the next day or beyond. That piece isn't always fully captured in the PT notes.    gabapentin  does help at times, it doesn't always prevent the flare-ups or lingering pain. Between that, the PT recovery, and upcoming diagnostics (nerve conduction studies, MRI), along with specialist follow-ups, it's been challenging to balance my care with maintaining a 40-hour workweek. Right now, I often find myself trying to "make up" work hours when I miss time, which has become unsustainable.    Based on what I've experienced over the last couple of months, I believe I'll realistically need intermittent leave about 1-2 times per week, averaging 6-8 hours total per week depending on the intensity of treatment and recovery.    I expect this will likely be needed for the 4 months while we continue a conservative management approach. My goal is to stay on top of treatment and avoid surgical intervention if possible. Having protected time for both the treatments and the recovery periods will make that possible.   Ann Frederick is a 53 year old female with cervical radiculopathy who presents with neck pain and functional limitations.  She experiences neck pain located in the lower cervical spine area, radiating to the middle of her back. 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 She attends physical therapy sessions twice a week, which result in significant recovery time, requiring her to rest on a heating pad for about four hours post-session.  She is currently taking gabapentin  for pain management and occasionally uses Flexeril  as needed at night. She is awaiting an electromyogram (EMG) scheduled for mid-October and is also waiting for MRI approvals. Her physical therapy has been recently extended for another four to six weeks.  In addition to her neck pain, she is on losartan  25 mg daily for blood pressure management and Wegovy  0.5 mg for weight loss. She reports a gradual decrease in weight, noting a recent weight of 168 pounds, down from 172 pounds. She previously experienced constipation with Saxenda , a similar medication, but is currently tolerating Wegovy  well.     Review of Systems  Constitutional:  Negative for chills, fatigue and fever.  HENT:  Negative for congestion, ear discharge, ear pain and postnasal drip.   Respiratory:  Negative for chest tightness, shortness of breath and wheezing.   Cardiovascular:  Negative for chest pain and palpitations.  Gastrointestinal:  Negative for abdominal pain and blood in stool.  Genitourinary:  Negative for dysuria.  Musculoskeletal:  Negative for back pain and gait problem.  Skin:  Negative for rash.  Neurological:  Negative for dizziness, speech difficulty and light-headedness.  Hematological:  Negative for adenopathy.  Psychiatric/Behavioral:  Negative for behavioral problems, decreased concentration and dysphoric mood. The patient is  not nervous/anxious.      Past Medical History:  Diagnosis Date   Acne    Anemia    history of   Anxiety    Arthritis    Depression    GERD (gastroesophageal reflux disease)    worse while pregnant   Headache    Migraines   Heart murmur    ? heart murmur in past    History of blood transfusion 2013   Hypertension    Metabolic syndrome      Social History   Socioeconomic History   Marital status: Married    Spouse name: Not on file   Number of children: 2   Years of education: Not on file   Highest education level: Master's degree (e.g., MA, MS, MEng, MEd, MSW, MBA)  Occupational History   Not on file  Tobacco Use   Smoking status: Never   Smokeless tobacco: Never  Vaping Use   Vaping status: Never Used  Substance and Sexual Activity   Alcohol use: Yes    Comment: 1 glass of wine per week prior to preg   Drug use: No   Sexual activity: Yes  Other Topics Concern   Not on file  Social History Narrative   Not on file   Social Drivers of Health   Financial Resource Strain: Low Risk  (06/25/2024)   Overall Financial Resource Strain (CARDIA)    Difficulty of Paying Living Expenses: Not hard at all  Food Insecurity: No Food Insecurity (06/25/2024)   Hunger Vital Sign    Worried About Running Out of Food in the Last Year: Never true    Ran Out of Food in the Last Year: Never true  Transportation Needs: No Transportation Needs (06/25/2024)   PRAPARE - Administrator, Civil Service (Medical): No    Lack of Transportation (Non-Medical): No  Physical Activity: Insufficiently Active (06/25/2024)   Exercise Vital Sign    Days of Exercise per Week: 2 days    Minutes of Exercise per Session: 30 min  Stress: Stress Concern Present (06/25/2024)   [employer] of Occupational Health - Occupational Stress Questionnaire    Feeling of Stress: Very much  Social Connections: Socially Integrated (06/25/2024)   Social Connection and Isolation Panel    Frequency of Communication with Friends and Family: Three times a week    Frequency of Social Gatherings with Friends and Family: Once a week    Attends Religious Services: More than 4 times per year    Active Member of Clubs or Organizations: Yes    Attends Banker Meetings:  More than 4 times per year    Marital Status: Married  Catering manager Violence: Not At Risk (06/10/2024)   Humiliation, Afraid, Rape, and Kick questionnaire    Fear of Current or Ex-Partner: No    Emotionally Abused: No    Physically Abused: No    Sexually Abused: No    Past Surgical History:  Procedure Laterality Date   ANTERIOR FUSION CERVICAL SPINE  2018   CESAREAN SECTION     x 2   CESAREAN SECTION  09/29/2012   Procedure: CESAREAN SECTION;  Surgeon: Alm JAYSON Cook, MD;  Location: [facility];  Service: Obstetrics;  Laterality: N/A;  Repeat edc 10/12/12/REQUEST;Chassity,Dee,Colleen   CHOLECYSTECTOMY N/A 02/26/2024   Procedure: LAPAROSCOPIC CHOLECYSTECTOMY  INTRAOPERATIVE CHOLANGIOGRAM;  Surgeon: Sheldon Standing, MD;  Location: [facility];  Service: General;  Laterality: N/A;   COLONOSCOPY     ESOPHAGOGASTRODUODENOSCOPY  normal  7/12   JOINT REPLACEMENT Bilateral    LAPAROSCOPIC VAGINAL HYSTERECTOMY WITH SALPINGECTOMY Bilateral 02/23/2016   Procedure: LAPAROSCOPIC ASSISTED VAGINAL HYSTERECTOMY WITH bilateral SALPINGECTOMY, right oophorectomy. laporotic repair of incidental cystotomy.;  Surgeon: Alm Cook, MD;  Location: [facility];  Service: Gynecology;  Laterality: Bilateral;   MOUTH SURGERY      Family History  Problem Relation Age of Onset   Depression Mother    Hypertension Mother    Diabetes Mother    Obesity Mother    Thyroid  disease Mother    Cancer Father        ? lung CA   Kidney disease Father    Hydrocephalus Sister    Colon cancer Neg Hx    Esophageal cancer Neg Hx    Liver cancer Neg Hx    Pancreatic cancer Neg Hx    Rectal cancer Neg Hx    Stomach cancer Neg Hx     Allergies  Allergen Reactions   Azithromycin  Diarrhea and Nausea And Vomiting   Dilaudid  [Hydromorphone ] Nausea Only    Current Outpatient Medications on File Prior to Visit  Medication Sig Dispense Refill   aspirin  EC 81 MG tablet Take 81 mg by mouth in the morning. Swallow whole.     buPROPion   (WELLBUTRIN  XL) 300 MG 24 hr tablet Take 300 mg by mouth every morning.     busPIRone  (BUSPAR ) 15 MG tablet TAKE 1 TABLET BY MOUTH TWICE A DAY 180 tablet 2   Cholecalciferol (VITAMIN D3) 1000 units CAPS Take 1,000 Units by mouth daily.     cyclobenzaprine  (FLEXERIL ) 10 MG tablet 1 tab po q hs prn neck pain 7 tablet 1   estradiol (ESTRACE) 2 MG tablet Take 2 mg by mouth daily.     ezetimibe  (ZETIA ) 10 MG tablet Take 1 tablet (10 mg total) by mouth daily. 90 tablet 3   gabapentin  (NEURONTIN ) 300 MG capsule Take 1 capsule twice daily, then take 2 capsules at bedtime 120 capsule 2   [Paused] losartan  (COZAAR ) 25 MG tablet Take 25 mg by mouth daily.     LYSINE PO Take 1 capsule by mouth daily.     ondansetron  (ZOFRAN -ODT) 4 MG disintegrating tablet Take 1 tablet (4 mg total) by mouth every 8 (eight) hours as needed. 8 tablet 0   Prenatal Vit-Fe Fumarate-FA (MULTIVITAMIN-PRENATAL) 27-0.8 MG TABS tablet Take 1 tablet by mouth daily at 12 noon.     prochlorperazine  (COMPAZINE ) 5 MG tablet Take 1-2 tablets (5-10 mg total) by mouth every 6 (six) hours as needed for nausea, vomiting or refractory nausea / vomiting. 15 tablet 2   Semaglutide -Weight Management (WEGOVY ) 0.25 MG/0.5ML SOAJ One injection 0.25 mg weekly 2 mL 0   UNABLE TO FIND Med Name: KETAMINE  250MG  ODT RASPBERRY     valACYclovir  (VALTREX ) 1000 MG tablet Take 2 tablets (2,000 mg total) by mouth 2 (two) times daily. (Patient taking differently: Take 2,000 mg by mouth 2 (two) times daily. BID prn) 4 tablet 0   Zinc  50 MG TABS Take 50 mg by mouth daily.     zolpidem  (AMBIEN ) 10 MG tablet Take 5-10 mg by mouth at bedtime as needed for sleep.     No current facility-administered medications on file prior to visit.    BP 130/78   Pulse 78   Temp 97.6 F (36.4 C) (Oral)   Resp 16   Ht 5' 2 (1.575 m)   Wt 168 lb 6.4 oz (76.4 kg)   LMP 02/11/2016 (Exact Date)  SpO2 96%   BMI 30.80 kg/m        Objective:   Physical  Exam  General Mental Status- Alert. General Appearance- Not in acute distress.   Skin General: Color- Normal Color. Moisture- Normal Moisture.  Neck Carotid Arteries- Normal color. Moisture- Normal Moisture. No carotid bruits. No JVD.  Chest and Lung Exam Auscultation: Breath Sounds:-CTA  Cardiovascular Auscultation:Rythm-RRR Murmurs & Other Heart Sounds:Auscultation of the heart reveals- No Murmurs.  Abdomen Inspection:-Inspeection Normal. Palpation/Percussion:Note:No mass. Palpation and Percussion of the abdomen reveal- Non Tender, Non Distended + BS, no rebound or guarding.   Neurologic Cranial Nerve exam:- CN III-XII intact(No nystagmus), symmetric smile. Strength:- 5/5 equal and symmetric strength both upper and lower extremities.       Assessment & Plan:   Patient Instructions  Cervical radiculopathy(neck pain) Chronic cervical radiculopathy with significant functional impairment post PT. Conservative treatment includes physical therapy, gabapentin , and Flexeril . Intermittent FMLA considered due to work-related challenges post-therapy. Current HR disability may not suit her needs. - Continue physical therapy twice a week. - Continue gabapentin . - Use Flexeril  as needed. - Fill out disability attending physician/PA-C statement with comments for intermittent leave. - Fax the form to HR. - Discuss with HR about the appropriateness of the intermittent leave form.  Obesity Obesity managed with Wegovy , resulting in weight loss from 172 lbs to 168 lbs. Current dose well-tolerated, prefers to maintain to avoid side effects experienced with Saxenda . - Continue Wegovy  at 0.5 mg. - Monitor for side effects before considering dose increase. - Refill Wegovy  prescription.  Essential hypertension Hypertension well-controlled with losartan . Recent blood pressure 130/78 mmHg. - Continue losartan  25 mg daily.  Follow up med december  wellness exam or sooner if needed   I  personally spent a total of 43 minutes in the care of the patient today including getting/reviewing separately obtained history, performing a medically appropriate exam/evaluation, counseling and educating, placing orders, and documenting clinical information in the EHR. Filling our moderate compex form during vist as well.

## 2024-08-24 NOTE — Patient Instructions (Signed)
 Cervical radiculopathy(neck pain) Chronic cervical radiculopathy with significant functional impairment post PT. Conservative treatment includes physical therapy, gabapentin , and Flexeril . Intermittent FMLA considered due to work-related challenges post-therapy. Current HR disability may not suit her needs. - Continue physical therapy twice a week. - Continue gabapentin . - Use Flexeril  as needed. - Fill out disability attending physician/PA-C statement with comments for intermittent leave. - Fax the form to HR. - Discuss with HR about the appropriateness of the intermittent leave form.  Obesity Obesity managed with Wegovy , resulting in weight loss from 172 lbs to 168 lbs. Current dose well-tolerated, prefers to maintain to avoid side effects experienced with Saxenda . - Continue Wegovy  at 0.5 mg. - Monitor for side effects before considering dose increase. - Refill Wegovy  prescription.  Essential hypertension Hypertension well-controlled with losartan . Recent blood pressure 130/78 mmHg. - Continue losartan  25 mg daily.  Follow up med december  wellness exam or sooner if needed

## 2024-08-31 ENCOUNTER — Encounter: Payer: Self-pay | Admitting: Medical

## 2024-08-31 ENCOUNTER — Ambulatory Visit (INDEPENDENT_AMBULATORY_CARE_PROVIDER_SITE_OTHER): Admitting: Medical

## 2024-08-31 VITALS — BP 138/88 | HR 82 | Temp 97.8°F | Resp 16 | Ht 62.0 in | Wt 170.8 lb

## 2024-08-31 DIAGNOSIS — M5412 Radiculopathy, cervical region: Secondary | ICD-10-CM | POA: Diagnosis not present

## 2024-08-31 NOTE — Progress Notes (Signed)
   Subjective:    Patient ID: Ann Frederick, female    DOB: [birth year], 53 y.o.   MRN: [id number]  HPI Ann Frederick is a 53 year old female with cervical radiculopathy who presents for completion of FMLA paperwork due to intermittent work absences.  See last note. Apparently pt was given wrong form and so in for follow and to fill out correct form.  She experiences severe symptoms of cervical radiculopathy, which are exacerbated by activities such as using a mouse and keyboard, integral to her job. Pain and recovery from physical therapy sessions contribute to her work absences.  She is currently undergoing treatment with Flexeril , gabapentin , and tramadol . She has had a consultation with a neurosurgeon and is awaiting MRI approval. An electrophysiogram is scheduled for October.  Recovery time from these sessions impacts her ability to work.   Review of Systems See  hpi    Objective:   Physical Exam  General Mental Status- Alert. General Appearance- Not in acute distress.   Skin General: Color- Normal Color. Moisture- Normal Moisture.  Neck Carotid Arteries- Normal color. Moisture- Normal Moisture. No carotid bruits. No JVD.  Chest and Lung Exam Auscultation: Breath Sounds:-CTA  Cardiovascular Auscultation:Rythm- RRR Murmurs & Other Heart Sounds:Auscultation of the heart reveals- No Murmurs.    Neurologic Cranial Nerve exam:- CN III-XII intact(No nystagmus), symmetric smile. Drift Test:- No drift. Strength:- 5/5 equal and symmetric strength both upper and lower extremities.       Assessment & Plan:   Patient Instructions  Cervical radiculopathy Cervical radiculopathy causing severe symptoms, exacerbated by work activities. Managed with Flexeril , gabapentin , and tramadol . Follow-up with neurosurgery and physical therapy ongoing. Awaiting MRI approval and scheduled for electrophysiogram in October. Expected resolution in less than six months. - Continue Flexeril ,  gabapentin , and tramadol . - Follow up with neurosurgery and physical therapy. - Undergo electrophysiogram in October. - Fill out intermittent leave form for work.  Follow up two month or sooner if needed   [employer], PA-C

## 2024-08-31 NOTE — Patient Instructions (Addendum)
 Cervical radiculopathy Cervical radiculopathy causing severe symptoms, exacerbated by work activities. Managed with Flexeril , gabapentin , and tramadol . Follow-up with neurosurgery and physical therapy ongoing. Awaiting MRI approval and scheduled for electrophysiogram in October. Expected resolution in less than six months. - Continue Flexeril , gabapentin , and tramadol . - Follow up with neurosurgery and physical therapy. - Undergo electrophysiogram in October. - Fill out intermittent leave form for work.  Follow up two month or sooner if needed

## 2024-09-05 ENCOUNTER — Ambulatory Visit: Attending: Medical | Admitting: Rehabilitation

## 2024-09-05 DIAGNOSIS — R293 Abnormal posture: Secondary | ICD-10-CM | POA: Insufficient documentation

## 2024-09-05 DIAGNOSIS — M6281 Muscle weakness (generalized): Secondary | ICD-10-CM | POA: Insufficient documentation

## 2024-09-05 DIAGNOSIS — M542 Cervicalgia: Secondary | ICD-10-CM | POA: Insufficient documentation

## 2024-09-05 DIAGNOSIS — M25611 Stiffness of right shoulder, not elsewhere classified: Secondary | ICD-10-CM | POA: Diagnosis not present

## 2024-09-05 DIAGNOSIS — M436 Torticollis: Secondary | ICD-10-CM | POA: Diagnosis not present

## 2024-09-05 NOTE — Therapy (Signed)
 OUTPATIENT PHYSICAL THERAPY SHOULDER TREATMENT    Patient Name: Ann Frederick MRN: [id number] DOB:[birth year], 53 y.o., female Today's Date: 09/05/2024  END OF SESSION:  PT End of Session - 09/05/24 1616     Visit Number 11    Date for PT Re-Evaluation 09/21/24    Authorization Type [employer] commercial    Progress Note Due on Visit 10    PT Start Time 1533    PT Stop Time 1604    PT Time Calculation (min) 31 min    Activity Tolerance Patient tolerated treatment well;No increased pain    Behavior During Therapy WFL for tasks assessed/performed                 Past Medical History:  Diagnosis Date   Acne    Anemia    history of   Anxiety    Arthritis    Depression    GERD (gastroesophageal reflux disease)    worse while pregnant   Headache    Migraines   Heart murmur    ? heart murmur in past   History of blood transfusion 2013   Hypertension    Metabolic syndrome    Past Surgical History:  Procedure Laterality Date   ANTERIOR FUSION CERVICAL SPINE  2018   CESAREAN SECTION     x 2   CESAREAN SECTION  09/29/2012   Procedure: CESAREAN SECTION;  Surgeon: Alm JAYSON Cook, MD;  Location: [facility];  Service: Obstetrics;  Laterality: N/A;  Repeat edc 10/12/12/REQUEST;Chassity,Dee,Colleen   CHOLECYSTECTOMY N/A 02/26/2024   Procedure: LAPAROSCOPIC CHOLECYSTECTOMY  INTRAOPERATIVE CHOLANGIOGRAM;  Surgeon: Sheldon Standing, MD;  Location: [facility];  Service: General;  Laterality: N/A;   COLONOSCOPY     ESOPHAGOGASTRODUODENOSCOPY  normal    7/12   JOINT REPLACEMENT Bilateral    LAPAROSCOPIC VAGINAL HYSTERECTOMY WITH SALPINGECTOMY Bilateral 02/23/2016   Procedure: LAPAROSCOPIC ASSISTED VAGINAL HYSTERECTOMY WITH bilateral SALPINGECTOMY, right oophorectomy. laporotic repair of incidental cystotomy.;  Surgeon: Alm Cook, MD;  Location: [facility];  Service: Gynecology;  Laterality: Bilateral;   MOUTH SURGERY     Patient Active Problem List   Diagnosis Date Noted   HTN (hypertension)  06/11/2024   Obesity (BMI 30-39.9) 06/11/2024   AKI (acute kidney injury) (HCC) 06/09/2024   Acute on chronic cholecystitis 02/25/2024   Cholecystitis 02/25/2024   Left hip pain 08/27/2019   Class 1 obesity due to excess calories without serious comorbidity with body mass index (BMI) of 34.0 to 34.9 in adult 11/28/2018   Heart palpitations 11/28/2018   OSA (obstructive sleep apnea) 09/14/2018   Neck pain 04/26/2018   Wellness examination 03/08/2016   S/P laparoscopic assisted vaginal hysterectomy (LAVH) 02/23/2016   Pink eye 06/19/2015   Pain of right thumb 06/19/2015   Sinusitis, acute frontal 02/05/2015   Headache around the eyes 02/05/2015   Back pain 11/01/2014   Routine general medical examination at a health care facility 10/30/2013   Vesicular rash 09/07/2013   Sinusitis 02/02/2013   Viral pharyngitis 03/03/2012   Flank pain 08/13/2011   Inguinal adenopathy 08/13/2011   Dysphagia 04/20/2011   GERD 01/29/2011   FATIGUE 01/29/2011   SCIATICA, BILATERAL 06/18/2010   LEG PAIN, BILATERAL 06/06/2010   HIP PAIN, LEFT 04/01/2010   MASS, LOCALIZED, SUPERFICIAL 03/05/2009   ACNE VULGARIS 07/11/2007   MURMUR 07/11/2007    PCP: Dorina Dallas RIGGERS   REFERRING PROVIDER: Dorina Dallas, PA-C   REFERRING DIAG: M25.511 (ICD-10-CM) - Acute pain of right shoulderS46.811A (ICD-10-CM) - Strain of right trapezius  muscle, initial encounter  THERAPY DIAG:  Neck pain  Muscle weakness (generalized)  Abnormal posture  Cervicalgia  Neck stiffness  Stiffness of right shoulder, not elsewhere classified  Rationale for Evaluation and Treatment: Rehabilitation  ONSET DATE: last 1-2 months   NEXT MD VISIT:  no f/u for neck pain unless needed;  PET scan 07/04/24 per oncologist SUBJECTIVE:                                                                                                                                                                                      SUBJECTIVE  STATEMENT:   Patient reports she is feeling worse now than when she started PT.  States paresthesias and pain are running down both arms now but RUE much more so than LUE.  She has beeen unable to get her MRI rescheduled for an open MRI, and is still working on this.  States she feels some numbness in her feet and legs now too at night.   She has EMG/NCV testing scheduled for October and then hopefully f/u with neurosurgery.    EVAL:  Patient presents to PT for R sided cervical/upper trap pain that has increased over the last 1-2 months for no apparent reason.  She denies any trauma or MOI.  She  also reports tingling sensation into the R arm into her fingers (all fingers) intermittently.  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   Reports has 3 computer monitors that she views throughout the workday.  She does not have an elevating work station that would allow her to stand and work.    States pain usually better in the AM and is worse  as the workday goes on.   States feels better with correct positioning and cross body shoulder HADD stretching.  She does not recall her HEP from therapy here a year ago, but states that it helped when she was doing it previously.   Of note, Patient was hospitalized in 3/25 for Abd pain, cholecystitis, and cholecystectomy.   She was hospitalized again 2 weeks ago for abdominal pain.  Abdominal CT demonstrated perirenal stranding and she has f/u with oncology and is scheduled for PET scan soon to r/o CA.    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 Yes: NPRS scale: 2/10 now, 8/10 worst Pain location: R UT  Pain description: aching, intermittent pain in RUT and intermittent tingling into the  RUE into the fingers (all fingers).    Aggravating factors: working and sitting thru the day at computer, [employer] Relieving factors: stretching neck into sidebending  PRECAUTIONS: h/o ACDF  RED FLAGS: None   WEIGHT BEARING RESTRICTIONS: No  FALLS:  Has patient fallen in last 6 months? 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  DIAGNOSTIC FINDINGS:  N/a  PATIENT SURVEYS:  Quick Dash: 34.1%  COGNITION: Overall cognitive status: Within functional limits for tasks assessed     SENSATION: WFL  POSTURE: Forward head rounded shoulders   CERVICAL ROM: 08/20/24  Active ROM A/PROM  08/20/24  Flexion 85%  Extension 75%  Right lateral flexion 50%  Left lateral flexion 60%  Right rotation 75%  Left rotation 75%   (Blank rows = not tested)   UPPER EXTREMITY ROM:   Active ROM Right eval Left eval R 08/02/24 RUE 08/20/24  Shoulder flexion 180 p 180 180- mild tingling 180  Shoulder extension 55 60    Shoulder abduction 175 p 175 170 175  Shoulder adduction Reach to opposite shoulder Reach to opposite shoulder    Shoulder internal rotation To L1 To T10 T7   Shoulder external rotation To C7 p To T2 To T3   Elbow flexion      Elbow extension      Wrist flexion      Wrist extension      Wrist ulnar deviation      Wrist radial deviation      Wrist pronation      Wrist supination      (Blank rows = not tested)  UPPER EXTREMITY MMT:  MMT Right eval Left eval RUE 08/20/24 LUE  08/20/24  Shoulder flexion 4   p 5 5 5   Shoulder extension 4- 4+    Shoulder abduction 4 p 4+ 5 5  Shoulder adduction      Shoulder internal rotation 4 4+ 5 5   Shoulder external rotation 4- 4 4 4+  Middle trapezius    4  Lower trapezius    4-  Elbow flexion 4 5    Elbow extension 4 4    Wrist flexion      Wrist extension      Wrist ulnar deviation      Wrist radial deviation      Wrist pronation      Wrist supination      Grip strength (lbs) 10 kg 8 kg    (Blank rows = not tested)  SHOULDER SPECIAL TESTS: Impingement tests: Hawkins/Kennedy impingement test: positive  SLAP lesions: NT Instability tests: NT Rotator cuff assessment: Infraspinatus test: negative Biceps assessment: NT  NECK SPECIAL TESTS:  Spurling's is negative bilaterally.  Compression is negative, distraction feel good, but no impact on RUE symptoms  JOINT MOBILITY TESTING:  WNL for R shoulder    PALPATION:  TTP over R UT and levator with trigger points in both  TREATMENT DATE:  09/05/24 THERAPEUTIC EXERCISE: To improve strength.  Demonstration, verbal and tactile cues throughout for technique. UBE L2.5 x 5' F/ #' B  THERAPEUTIC ACTIVITIES: To improve functional performance.  Demonstration, verbal and tactile cues throughout for technique. Seated Lat PD x 25# x 2/15 BUE Seated row 25# close grip x 15;  wide grip x 15 BUE  NEUROMUSCULAR RE-EDUCATION: To improve kinesthesia and posture. Kneeling over [employer], W's, and arrows 1# x 20 BUE;  Y's with no weight x 20 BUE,   MANUAL THERAPY: To promote increased ROM utilizing joint mobilization. Grade 3-4 jt mobs to T2-T8 P/A glides x 20-30 reps each level Deep pressure Tp release to R rhomboid/MT area  08/20/24 THERAPEUTIC EXERCISE: To improve strength.  Demonstration, verbal and tactile cues throughout for technique. UBE L2.5 x 5' F/ #' B  THERAPEUTIC ACTIVITIES: To improve functional performance.  Demonstration, verbal and tactile cues throughout for technique. Prone shoulder  ext 2# x 20  Prone shoulder HABD 0# x 20 Prone shoulder LT raise 0# x 20 Lat PD 25# x 20 Rowing blue and black TB together x 30  NEUROMUSCULAR RE-EDUCATION: To improve kinesthesia and posture. Red swiss ball Y's, T's, arrows 0# x 20 BUE  MANUAL THERAPY: To promote reduced pain utilizing manual TP therapy. Tp deep pressure to R rhomboids along medial scapular border  08/14/24: therex:  Nustep L 6 UE's and LE's x 6 min Side lying for shoulder ER R 1#, L 2 # 20 reps Side lying for thoracic rotation, open books 15 reps each side Seated for chest press hands horizontal 5# 15 reps Seated rows lower hands 15 reps 15# Seated rows high hands 15 reps 15 # Prone for shoulder ext 3# L, 0# R 20 reps Prone horizontal shoulder abd 0# each 20 reps  PATIENT EDUCATION:  Education details: Continuing PT x 1w4 with focus on strengthening scapular musculature  Person educated: Patient Education method: Explanation, Demonstration, Verbal cues, Tactile cues, and MedBridgeGO app access provided Education comprehension: verbal cues required, tactile cues required, and needs further education   HOME EXERCISE PROGRAM: Access Code: [email or web address] URL: [email or web address] Date: 08/20/2024 Prepared by: Garnette Montclair  Exercises - Seated Upper Trapezius Stretch  - 1 x daily - 7 x weekly - 2 sets - 2 reps - 1 min hold - Seated Levator Scapulae Stretch  - 1 x daily - 7 x weekly - 2 sets - 1 reps - 1 min hold - Seated Cervical Extension AROM  - 3 x daily - 1 x weekly - 1 sets - 10 reps - 5 hold - Seated Thoracic Lumbar Extension with Pectoralis Stretch  - 3 x daily - 7 x weekly - 1 sets - 10 reps - 5 sec hold - Prone Shoulder Row  - 1 x daily - 7 x weekly - 3 sets - 10 reps - Prone Shoulder Extension - Single Arm with Dumbbell  - 1 x daily - 7 x weekly - 3 sets - 10 reps  ASSESSMENT:  CLINICAL IMPRESSION:  Patient is having worsening radicular pain into both arms now with new  tingling/paresthesia in her feet.   She is able to complete her postural corrective exercises today with good form and tolerance.  Not sure why her symtoms are getting worse, but she will need to f/u with Dr Onetha once she gets her testing/imaging done.    Eval:Patient is a 53 y.o. female referred  for physical therapy evaluation and treatment for R  neck pain and R trapezius strain by PCP.  She demonstrates RUE weakness and stiffness throughout the R neck, shoulder, and elbow with postural deficits, trigger points in the R UT and levator with pain and also tingling intermittently into the RUE.   Patient is cleared for PT and would benefit from PT to address the above deficits.   She has good rehab potential to decreased her pain and improve her cervical ROM, shoulder/scapular strength and stability and be able to work painfree.   She has had PT here in past for this problem with good relief and is highly motivated to improve her condition.    OBJECTIVE IMPAIRMENTS: decreased knowledge of condition, decreased ROM, decreased strength, increased fascial restrictions, increased muscle spasms, impaired flexibility, impaired sensation, impaired UE functional use, improper body mechanics, postural dysfunction, and pain.   ACTIVITY LIMITATIONS: lifting, sitting, reach over head, and hygiene/grooming  PARTICIPATION LIMITATIONS: cleaning, laundry, and occupation  PERSONAL FACTORS: Profession and 1-2 comorbidities: ACDF, OA, migraines, HTN  are also affecting patient's functional outcome.   REHAB POTENTIAL: Good  CLINICAL DECISION MAKING: Evolving/moderate complexity  EVALUATION COMPLEXITY: Moderate   GOALS: Goals reviewed with patient? Yes  SHORT TERM GOALS: Target date: 07/24/24  Patient will be independent with initial HEP.  Baseline: 100% PT assistance required for correct completion  08/20/24:  can return/demo all HEP Goal status: MET  2.  Patient will report 50% subjective improvement in pain as  indicated by 4/10 worst pain in the neck/R UT   Baseline: 8/10 worst  Goal status: MET- 07/18/24    LONG TERM GOALS: Target date: 09/21/24   Patient will be independent with advanced/ongoing HEP to improve outcomes and carryover.  Baseline: 100% PT assistance required for initial HEP, nothing advanced yet 08/20/24:  Patient can teach back all HEP Goal status: MET  2.  Patient will report 100% improvement in R shoulder pain to improve QOL.  Baseline: 8/10 worst pain 08/20/24:  1/10 worst Goal status: MET  3.  Patient to improve R shoulder AROM to [facility] without pain provocation to allow for increased ease of ADLs.  Baseline: 55 degrees R shoulder ER  Goal status: IN PROGRESS- 08/02/24 good ROM but avoids resisted OH reaching d/t pain  4.  Patient will demonstrate improved functional UE strength as demonstrated by 5/5 R shoulder strength for ability to do haircare and household and recreational activities without needing rest . Baseline: unable to complete without stopping for rest 08/20/24: Strength testing today reveals 4 to 4+ strength in scapular stabilizers of the R shoulder Goal status: IN PROGRESS  5.  Patient will report 10 points improvement on QuickDash to demonstrate improved functional ability.  Baseline: 34.1 08/20/24:  40 Goal status: IN PROGRESS   PLAN:  PT FREQUENCY: 2x/week  PT DURATION: 8 weeks  PLANNED INTERVENTIONS: 97164- PT Re-evaluation, 97750- Physical Performance Testing, 97110-Therapeutic exercises, 97530- Therapeutic activity, [id number]- Neuromuscular re-education, 97535- Self Care, [id number]- Manual therapy, [id number]- Electrical stimulation (unattended), 97016- Vasopneumatic device, 97035- Ultrasound, [id number]- Ionotophoresis 4mg /ml Dexamethasone , Patient/Family education, Taping, Joint mobilization, Spinal mobilization, DME instructions, Cryotherapy, Moist heat, and Biofeedback  PLAN FOR NEXT SESSION: revisit foam roll lying, POE with retraction.  Plan for 2 more visits and  she will need to continue with her Home program daily until she f/u with Dr Onetha for his opinion on the persistent paresthesias/pain in her arms   Kasee Hantz, PT,  09/05/2024, 11:34 PM

## 2024-09-07 ENCOUNTER — Other Ambulatory Visit: Payer: Self-pay | Admitting: Neurosurgery

## 2024-09-07 DIAGNOSIS — M542 Cervicalgia: Secondary | ICD-10-CM

## 2024-09-07 DIAGNOSIS — S0502XA Injury of conjunctiva and corneal abrasion without foreign body, left eye, initial encounter: Secondary | ICD-10-CM | POA: Diagnosis not present

## 2024-09-10 ENCOUNTER — Ambulatory Visit: Admitting: Rehabilitation

## 2024-09-10 DIAGNOSIS — M6281 Muscle weakness (generalized): Secondary | ICD-10-CM

## 2024-09-10 DIAGNOSIS — M542 Cervicalgia: Secondary | ICD-10-CM

## 2024-09-10 DIAGNOSIS — M436 Torticollis: Secondary | ICD-10-CM

## 2024-09-10 DIAGNOSIS — R293 Abnormal posture: Secondary | ICD-10-CM

## 2024-09-10 DIAGNOSIS — M25611 Stiffness of right shoulder, not elsewhere classified: Secondary | ICD-10-CM | POA: Diagnosis not present

## 2024-09-10 NOTE — Therapy (Signed)
 OUTPATIENT PHYSICAL THERAPY SHOULDER TREATMENT    Patient Name: Ann Frederick MRN: [id number] DOB:October 01, 1971, 53 y.o., female Today's Date: 09/10/2024  END OF SESSION:  PT End of Session - 09/10/24 0935     Visit Number 12    Date for PT Re-Evaluation 09/21/24    Authorization Type [employer] commercial    Progress Note Due on Visit 10    PT Start Time [phone]    PT Stop Time 1000    PT Time Calculation (min) 29 min    Activity Tolerance Patient tolerated treatment well;No increased pain    Behavior During Therapy WFL for tasks assessed/performed                 Past Medical History:  Diagnosis Date   Acne    Anemia    history of   Anxiety    Arthritis    Depression    GERD (gastroesophageal reflux disease)    worse while pregnant   Headache    Migraines   Heart murmur    ? heart murmur in past   History of blood transfusion 2013   Hypertension    Metabolic syndrome    Past Surgical History:  Procedure Laterality Date   ANTERIOR FUSION CERVICAL SPINE  2018   CESAREAN SECTION     x 2   CESAREAN SECTION  09/29/2012   Procedure: CESAREAN SECTION;  Surgeon: Alm JAYSON Cook, MD;  Location: [facility];  Service: Obstetrics;  Laterality: N/A;  Repeat edc 10/12/12/REQUEST;Chassity,Dee,Colleen   CHOLECYSTECTOMY N/A 02/26/2024   Procedure: LAPAROSCOPIC CHOLECYSTECTOMY  INTRAOPERATIVE CHOLANGIOGRAM;  Surgeon: Sheldon Standing, MD;  Location: [facility];  Service: General;  Laterality: N/A;   COLONOSCOPY     ESOPHAGOGASTRODUODENOSCOPY  normal    7/12   JOINT REPLACEMENT Bilateral    LAPAROSCOPIC VAGINAL HYSTERECTOMY WITH SALPINGECTOMY Bilateral 02/23/2016   Procedure: LAPAROSCOPIC ASSISTED VAGINAL HYSTERECTOMY WITH bilateral SALPINGECTOMY, right oophorectomy. laporotic repair of incidental cystotomy.;  Surgeon: Alm Cook, MD;  Location: [facility];  Service: Gynecology;  Laterality: Bilateral;   MOUTH SURGERY     Patient Active Problem List   Diagnosis Date Noted   HTN (hypertension)  06/11/2024   Obesity (BMI 30-39.9) 06/11/2024   AKI (acute kidney injury) (HCC) 06/09/2024   Acute on chronic cholecystitis 02/25/2024   Cholecystitis 02/25/2024   Left hip pain 08/27/2019   Class 1 obesity due to excess calories without serious comorbidity with body mass index (BMI) of 34.0 to 34.9 in adult 11/28/2018   Heart palpitations 11/28/2018   OSA (obstructive sleep apnea) 09/14/2018   Neck pain 04/26/2018   Wellness examination 03/08/2016   S/P laparoscopic assisted vaginal hysterectomy (LAVH) 02/23/2016   Pink eye 06/19/2015   Pain of right thumb 06/19/2015   Sinusitis, acute frontal 02/05/2015   Headache around the eyes 02/05/2015   Back pain 11/01/2014   Routine general medical examination at a health care facility 10/30/2013   Vesicular rash 09/07/2013   Sinusitis 02/02/2013   Viral pharyngitis 03/03/2012   Flank pain 08/13/2011   Inguinal adenopathy 08/13/2011   Dysphagia 04/20/2011   GERD 01/29/2011   FATIGUE 01/29/2011   SCIATICA, BILATERAL 06/18/2010   LEG PAIN, BILATERAL 06/06/2010   HIP PAIN, LEFT 04/01/2010   MASS, LOCALIZED, SUPERFICIAL 03/05/2009   ACNE VULGARIS 07/11/2007   MURMUR 07/11/2007    PCP: Dorina Dallas RIGGERS   REFERRING PROVIDER: Dorina Dallas, PA-C   REFERRING DIAG: M25.511 (ICD-10-CM) - Acute pain of right shoulderS46.811A (ICD-10-CM) - Strain of right trapezius  muscle, initial encounter  THERAPY DIAG:  Neck pain  Muscle weakness (generalized)  Abnormal posture  Cervicalgia  Neck stiffness  Stiffness of right shoulder, not elsewhere classified  Rationale for Evaluation and Treatment: Rehabilitation  ONSET DATE: last 1-2 months   NEXT MD VISIT:  no f/u for neck pain unless needed;  PET scan 07/04/24 per oncologist SUBJECTIVE:                                                                                                                                                                                      SUBJECTIVE  STATEMENT:   Patient reports she is having cervical MRI tomorrow at [facility].  She reports pain today is 1/10, but 3/10 yesterday.  Still feeling numbness/tingling into both arms R > LUE  EVAL:  Patient presents to PT for R sided cervical/upper trap pain that has increased over the last 1-2 months for no apparent reason.  She denies any trauma or MOI.  She  also reports tingling sensation into the R arm into her fingers (all fingers) intermittently.  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   Reports has 3 computer monitors that she views throughout the workday.  She does not have an elevating work station that would allow her to stand and work.    States pain usually better in the AM and is worse  as the workday goes on.   States feels better with correct positioning and cross body shoulder HADD stretching.  She does not recall her HEP from therapy here a year ago, but states that it helped when she was doing it previously.   Of note, Patient was hospitalized in 3/25 for Abd pain, cholecystitis, and cholecystectomy.   She was hospitalized again 2 weeks ago for abdominal pain.  Abdominal CT demonstrated perirenal stranding and she has f/u with oncology and is scheduled for PET scan soon to r/o CA.    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 Yes: NPRS scale: 2/10 now, 8/10 worst Pain location: R UT  Pain description: aching, intermittent pain in RUT and intermittent tingling into the RUE into the fingers (all fingers).   Aggravating factors: working and sitting thru the day at computer, [employer] Relieving factors: stretching neck into sidebending  PRECAUTIONS: h/o ACDF  RED FLAGS: None   WEIGHT BEARING RESTRICTIONS: No  FALLS:  Has patient  fallen in last 6 months? 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  DIAGNOSTIC FINDINGS:  N/a  PATIENT SURVEYS:  Quick Dash: 34.1%  COGNITION: Overall cognitive status: Within functional limits for tasks assessed     SENSATION: WFL  POSTURE: Forward head rounded shoulders   CERVICAL ROM: 08/20/24  Active ROM A/PROM  08/20/24  Flexion 85%  Extension 75%  Right lateral flexion 50%  Left lateral flexion 60%  Right rotation 75%  Left rotation 75%   (Blank rows = not tested)   UPPER EXTREMITY ROM:   Active ROM Right eval Left eval R 08/02/24 RUE 08/20/24  Shoulder flexion 180 p 180 180- mild tingling 180  Shoulder extension 55 60    Shoulder abduction 175 p 175 170 175  Shoulder adduction Reach to opposite shoulder Reach to opposite shoulder    Shoulder internal rotation To L1 To T10 T7   Shoulder external rotation To C7 p To T2 To T3   Elbow flexion      Elbow extension      Wrist flexion      Wrist extension      Wrist ulnar deviation      Wrist radial deviation      Wrist pronation      Wrist supination      (Blank rows = not tested)  UPPER EXTREMITY MMT:  MMT Right eval Left eval RUE 08/20/24 LUE  08/20/24 RUE 09/10/24 LUE 09/10/24  Shoulder flexion 4   p 5 5 5 5 5   Shoulder extension 4- 4+   4+ 5  Shoulder abduction 4 p 4+ 5 5 5 5   Shoulder adduction        Shoulder internal rotation 4 4+ 5 5 5 5   Shoulder external rotation 4- 4 4 4+    Middle trapezius    4 4 4   Lower trapezius    4- 4- 4  Elbow flexion 4 5      Elbow extension 4 4      Wrist flexion        Wrist extension         Wrist ulnar deviation        Wrist radial deviation        Wrist pronation        Wrist supination        Grip strength (lbs) 10 kg 8 kg   8kg 8kg  Pinch strength     2kg 4kg  (Blank rows = not tested)  SHOULDER SPECIAL TESTS: Impingement tests: Hawkins/Kennedy impingement test: positive  SLAP lesions: NT Instability tests: NT Rotator cuff assessment: Infraspinatus test: negative Biceps assessment: NT  NECK SPECIAL TESTS:  Spurling's is negative bilaterally.  Compression is negative, distraction feel good, but no impact on RUE symptoms  JOINT MOBILITY TESTING:  WNL for R shoulder    PALPATION:  TTP over R UT and levator with trigger points in both  TREATMENT DATE:  09/10/24 THERAPEUTIC EXERCISE: To improve strength.  Demonstration, verbal and tactile cues throughout for technique. UBE L2.5 x 5' F/ #' B  THERAPEUTIC ACTIVITIES: To improve functional performance.  Demonstration, verbal and tactile cues throughout for technique. Strength recheck Prone cervical retraction x 20 POE w/ cervical retraction x 20 Prone HABD thumb up x 20 BUE Prone ER at 90 deg x 20 BUE Prone row at 90 deg abduction x 20 BUE Prone LT raise x 20 BUE Doorway pec minor shoulder extension stretch x 1'  BUE Doorway pec stretch at 90 deg x 1' BUE  NEUROMUSCULAR RE-EDUCATION: To improve kinesthesia and posture. Supine foam roll pec stretch with scapular retraction x 20 Supine foam roll alternating shoulder flexion x 30 BUE   09/05/24 THERAPEUTIC EXERCISE: To improve strength.  Demonstration, verbal and tactile cues throughout for technique. UBE L2.5 x 5' F/ #' B  THERAPEUTIC ACTIVITIES: To improve functional performance.  Demonstration, verbal and tactile cues throughout for technique. Seated Lat PD x 25# x 2/15 BUE Seated row 25# close grip x 15;  wide grip x 15  BUE  NEUROMUSCULAR RE-EDUCATION: To improve kinesthesia and posture. Kneeling over [employer], W's, and arrows 1# x 20 BUE;  Y's with no weight x 20 BUE,   MANUAL THERAPY: To promote increased ROM utilizing joint mobilization. Grade 3-4 jt mobs to T2-T8 P/A glides x 20-30 reps each level Deep pressure Tp release to R rhomboid/MT area  08/20/24 THERAPEUTIC EXERCISE: To improve strength.  Demonstration, verbal and tactile cues throughout for technique. UBE L2.5 x 5' F/ #' B  THERAPEUTIC ACTIVITIES: To improve functional performance.  Demonstration, verbal and tactile cues throughout for technique. Prone shoulder ext 2# x 20  Prone shoulder HABD 0# x 20 Prone shoulder LT raise 0# x 20 Lat PD 25# x 20 Rowing blue and black TB together x 30  NEUROMUSCULAR RE-EDUCATION: To improve kinesthesia and posture. Red swiss ball Y's, T's, arrows 0# x 20 BUE  MANUAL THERAPY: To promote reduced pain utilizing manual TP therapy. Tp deep pressure to R rhomboids along medial scapular border  08/14/24: therex:  Nustep L 6 UE's and LE's x 6 min Side lying for shoulder ER R 1#, L 2 # 20 reps Side lying for thoracic rotation, open books 15 reps each side Seated for chest press hands horizontal 5# 15 reps Seated rows lower hands 15 reps 15# Seated rows high hands 15 reps 15 # Prone for shoulder ext 3# L, 0# R 20 reps Prone horizontal shoulder abd 0# each 20 reps  PATIENT EDUCATION:  Education details: Continuing PT x 1w4 with focus on strengthening scapular musculature  Person educated: Patient Education method: Explanation, Demonstration, Verbal cues, Tactile cues, and MedBridgeGO app access provided Education comprehension: verbal cues required, tactile cues required, and needs further education   HOME EXERCISE PROGRAM: Access Code: [email or web address] URL: [email or web address] Date: 09/10/2024 Prepared by: Garnette Montclair  Exercises - Seated Upper Trapezius Stretch  - 1 x  daily - 7 x weekly - 2 sets - 2 reps - 1 min hold - Seated Levator Scapulae Stretch  - 1 x daily - 7 x weekly - 2 sets - 1 reps - 1 min hold - Seated Cervical Extension AROM  - 3 x daily - 1 x weekly - 1 sets - 10 reps - 5 hold - Seated Thoracic Lumbar Extension with Pectoralis Stretch  - 3 x daily - 7 x weekly - 1 sets - 10 reps -  5 sec hold - Prone Shoulder Extension - Single Arm with Dumbbell  - 1 x daily - 7 x weekly - 3 sets - 10 reps - Prone Cervical Retraction  - 1 x daily - 7 x weekly - 3 sets - 10 reps - Cervical Retraction Prone on Elbows  - 1 x daily - 7 x weekly - 3 sets - 10 reps - Prone Single Arm Shoulder Horizontal Abduction with Dumbbell  - 1 x daily - 7 x weekly - 3 sets - 10 reps - Prone Shoulder Row in Abduction  - 1 x daily - 7 x weekly - 3 sets - 10 reps - Prone Scapular Retraction with Shoulder External Rotation in Abduction  - 1 x daily - 7 x weekly - 3 sets - 10 reps - Supine Chest Stretch on Foam Roll  - 1 x daily - 7 x weekly - 3 sets - 10 reps - [place] on Foam Roll  - 1 x daily - 7 x weekly - 3 sets - 10 reps - Overhead Reach on Foam Roll  - 1 x daily - 7 x weekly - 3 sets - 10 reps - Doorway Pec Stretch at 90 Degrees Abduction  - 1 x daily - 7 x weekly - 1 sets - 1 reps - 1 min hold - Doorway Pec Stretch at 60 Elevation  - 1 x daily - 7 x weekly - 1 sets - 1 reps - 1 min hold  ASSESSMENT:  CLINICAL IMPRESSION:  HEP is updated today and we went back to the prone scapular strengthening exercises rather than on the swiss ball to try to isolate her middle and lower traps better with focus on good form.   She has tremulousness in the middle trap, teres minor and infraspinatus on all exercises demonstrating that she is probably not quite ready for the swiss ball advanced exercise.   Her pain seems to be better, but the paresthesias are not.   She has an MRI scheduled for tomorrow so we will be looking for these results.  PT remains necessary for weakness, stiffness,  postural deficits.   Continue per POC  Eval:Patient is a 53 y.o. female referred  for physical therapy evaluation and treatment for R neck pain and R trapezius strain by PCP.  She demonstrates RUE weakness and stiffness throughout the R neck, shoulder, and elbow with postural deficits, trigger points in the R UT and levator with pain and also tingling intermittently into the RUE.   Patient is cleared for PT and would benefit from PT to address the above deficits.   She has good rehab potential to decreased her pain and improve her cervical ROM, shoulder/scapular strength and stability and be able to work painfree.   She has had PT here in past for this problem with good relief and is highly motivated to improve her condition.    OBJECTIVE IMPAIRMENTS: decreased knowledge of condition, decreased ROM, decreased strength, increased fascial restrictions, increased muscle spasms, impaired flexibility, impaired sensation, impaired UE functional use, improper body mechanics, postural dysfunction, and pain.   ACTIVITY LIMITATIONS: lifting, sitting, reach over head, and hygiene/grooming  PARTICIPATION LIMITATIONS: cleaning, laundry, and occupation  PERSONAL FACTORS: Profession and 1-2 comorbidities: ACDF, OA, migraines, HTN  are also affecting patient's functional outcome.   REHAB POTENTIAL: Good  CLINICAL DECISION MAKING: Evolving/moderate complexity  EVALUATION COMPLEXITY: Moderate   GOALS: Goals reviewed with patient? Yes  SHORT TERM GOALS: Target date: 07/24/24  Patient will be independent  with initial HEP.  Baseline: 100% PT assistance required for correct completion  08/20/24:  can return/demo all HEP Goal status: MET  2.  Patient will report 50% subjective improvement in pain as indicated by 4/10 worst pain in the neck/R UT   Baseline: 8/10 worst  Goal status: MET- 07/18/24    LONG TERM GOALS: Target date: 09/21/24   Patient will be independent with advanced/ongoing HEP to improve  outcomes and carryover.  Baseline: 100% PT assistance required for initial HEP, nothing advanced yet 08/20/24:  Patient can teach back all HEP Goal status: MET  2.  Patient will report 100% improvement in R shoulder pain to improve QOL.  Baseline: 8/10 worst pain 08/20/24:  1/10 worst Goal status: MET  3.  Patient to improve R shoulder AROM to [facility] without pain provocation to allow for increased ease of ADLs.  Baseline: 55 degrees R shoulder ER  Goal status: IN PROGRESS- 08/02/24 good ROM but avoids resisted OH reaching d/t pain  4.  Patient will demonstrate improved functional UE strength as demonstrated by 5/5 R shoulder strength for ability to do haircare and household and recreational activities without needing rest . Baseline: unable to complete without stopping for rest 08/20/24: Strength testing today reveals 4 to 4+ strength in scapular stabilizers of the R shoulder Goal status: IN PROGRESS  5.  Patient will report 10 points improvement on QuickDash to demonstrate improved functional ability.  Baseline: 34.1 08/20/24:  40 Goal status: IN PROGRESS   PLAN:  PT FREQUENCY: 2x/week  PT DURATION: 8 weeks  PLANNED INTERVENTIONS: 97164- PT Re-evaluation, 97750- Physical Performance Testing, 97110-Therapeutic exercises, 97530- Therapeutic activity, [id number]- Neuromuscular re-education, 97535- Self Care, [id number]- Manual therapy, [id number]- Electrical stimulation (unattended), 97016- Vasopneumatic device, [id number]- Ultrasound, 02966- Ionotophoresis 4mg /ml Dexamethasone , Patient/Family education, Taping, Joint mobilization, Spinal mobilization, DME instructions, Cryotherapy, Moist heat, and Biofeedback  PLAN FOR NEXT SESSION: Patient has 2 more PT visits.  See if MRI results are available next visit and proceed accordingly.   Review HEP for periscapular and cervical strengthening; wall angels vs foam roll angels   Pattye Meda, PT,  09/10/2024, 10:49 AM

## 2024-09-11 ENCOUNTER — Ambulatory Visit
Admission: RE | Admit: 2024-09-11 | Discharge: 2024-09-11 | Disposition: A | Discharge status: Home / Self Care | Source: Ambulatory Visit | Attending: Neurosurgery | Admitting: Neurosurgery

## 2024-09-11 DIAGNOSIS — M4803 Spinal stenosis, cervicothoracic region: Secondary | ICD-10-CM | POA: Diagnosis not present

## 2024-09-11 DIAGNOSIS — M47813 Spondylosis without myelopathy or radiculopathy, cervicothoracic region: Secondary | ICD-10-CM | POA: Diagnosis not present

## 2024-09-11 DIAGNOSIS — M542 Cervicalgia: Secondary | ICD-10-CM

## 2024-09-11 DIAGNOSIS — M5023 Other cervical disc displacement, cervicothoracic region: Secondary | ICD-10-CM | POA: Diagnosis not present

## 2024-09-17 ENCOUNTER — Ambulatory Visit

## 2024-09-19 ENCOUNTER — Ambulatory Visit

## 2024-09-19 DIAGNOSIS — M25611 Stiffness of right shoulder, not elsewhere classified: Secondary | ICD-10-CM

## 2024-09-19 DIAGNOSIS — M6281 Muscle weakness (generalized): Secondary | ICD-10-CM

## 2024-09-19 DIAGNOSIS — R293 Abnormal posture: Secondary | ICD-10-CM

## 2024-09-19 DIAGNOSIS — M542 Cervicalgia: Secondary | ICD-10-CM | POA: Diagnosis not present

## 2024-09-19 DIAGNOSIS — M436 Torticollis: Secondary | ICD-10-CM

## 2024-09-19 NOTE — Therapy (Addendum)
 " OUTPATIENT PHYSICAL THERAPY SHOULDER TREATMENT / DC SUMMARY   Patient Name: Ann Frederick MRN: [id number] DOB:04-Jun-1971, 53 y.o., female Today's Date: 09/19/2024  END OF SESSION:  PT End of Session - 09/19/24 1332     Visit Number 13    Date for Recertification  09/21/24    Authorization Type [employer] commercial    Progress Note Due on Visit 10    PT Start Time 1317    PT Stop Time 1401    PT Time Calculation (min) 44 min    Activity Tolerance Patient tolerated treatment well;No increased pain    Behavior During Therapy WFL for tasks assessed/performed                  Past Medical History:  Diagnosis Date   Acne    Anemia    history of   Anxiety    Arthritis    Depression    GERD (gastroesophageal reflux disease)    worse while pregnant   Headache    Migraines   Heart murmur    ? heart murmur in past   History of blood transfusion 2013   Hypertension    Metabolic syndrome    Past Surgical History:  Procedure Laterality Date   ANTERIOR FUSION CERVICAL SPINE  2018   CESAREAN SECTION     x 2   CESAREAN SECTION  09/29/2012   Procedure: CESAREAN SECTION;  Surgeon: Alm JAYSON Cook, MD;  Location: [facility];  Service: Obstetrics;  Laterality: N/A;  Repeat edc 10/12/12/REQUEST;Chassity,Dee,Colleen   CHOLECYSTECTOMY N/A 02/26/2024   Procedure: LAPAROSCOPIC CHOLECYSTECTOMY  INTRAOPERATIVE CHOLANGIOGRAM;  Surgeon: Sheldon Standing, MD;  Location: [facility];  Service: General;  Laterality: N/A;   COLONOSCOPY     ESOPHAGOGASTRODUODENOSCOPY  normal    7/12   JOINT REPLACEMENT Bilateral    LAPAROSCOPIC VAGINAL HYSTERECTOMY WITH SALPINGECTOMY Bilateral 02/23/2016   Procedure: LAPAROSCOPIC ASSISTED VAGINAL HYSTERECTOMY WITH bilateral SALPINGECTOMY, right oophorectomy. laporotic repair of incidental cystotomy.;  Surgeon: Alm Cook, MD;  Location: [facility];  Service: Gynecology;  Laterality: Bilateral;   MOUTH SURGERY     Patient Active Problem List   Diagnosis Date Noted   HTN  (hypertension) 06/11/2024   Obesity (BMI 30-39.9) 06/11/2024   AKI (acute kidney injury) 06/09/2024   Acute on chronic cholecystitis 02/25/2024   Cholecystitis 02/25/2024   Left hip pain 08/27/2019   Class 1 obesity due to excess calories without serious comorbidity with body mass index (BMI) of 34.0 to 34.9 in adult 11/28/2018   Heart palpitations 11/28/2018   OSA (obstructive sleep apnea) 09/14/2018   Neck pain 04/26/2018   Wellness examination 03/08/2016   S/P laparoscopic assisted vaginal hysterectomy (LAVH) 02/23/2016   Pink eye 06/19/2015   Pain of right thumb 06/19/2015   Sinusitis, acute frontal 02/05/2015   Headache around the eyes 02/05/2015   Back pain 11/01/2014   Routine general medical examination at a health care facility 10/30/2013   Vesicular rash 09/07/2013   Sinusitis 02/02/2013   Viral pharyngitis 03/03/2012   Flank pain 08/13/2011   Inguinal adenopathy 08/13/2011   Dysphagia 04/20/2011   GERD 01/29/2011   FATIGUE 01/29/2011   SCIATICA, BILATERAL 06/18/2010   LEG PAIN, BILATERAL 06/06/2010   HIP PAIN, LEFT 04/01/2010   MASS, LOCALIZED, SUPERFICIAL 03/05/2009   ACNE VULGARIS 07/11/2007   MURMUR 07/11/2007    PCP: Dorina Dallas RIGGERS   REFERRING PROVIDER: Dorina Dallas, PA-C   REFERRING DIAG: M25.511 (ICD-10-CM) - Acute pain of right shoulderS46.811A (ICD-10-CM) - Strain  of right trapezius muscle, initial encounter  THERAPY DIAG:  Neck pain  Muscle weakness (generalized)  Abnormal posture  Cervicalgia  Neck stiffness  Stiffness of right shoulder, not elsewhere classified  Rationale for Evaluation and Treatment: Rehabilitation  ONSET DATE: last 1-2 months   NEXT MD VISIT:  no f/u for neck pain unless needed;  PET scan 07/04/24 per oncologist SUBJECTIVE:                                                                                                                                                                                       SUBJECTIVE STATEMENT:   Pain reports tingling and soreness down R arm   EVAL:  Patient presents to PT for R sided cervical/upper trap pain that has increased over the last 1-2 months for no apparent reason.  She denies any trauma or MOI.  She  also reports tingling sensation into the R arm into her fingers (all fingers) intermittently.  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   Reports has 3 computer monitors that she views throughout the workday.  She does not have an elevating work station that would allow her to stand and work.    States pain usually better in the AM and is worse  as the workday goes on.   States feels better with correct positioning and cross body shoulder HADD stretching.  She does not recall her HEP from therapy here a year ago, but states that it helped when she was doing it previously.   Of note, Patient was hospitalized in 3/25 for Abd pain, cholecystitis, and cholecystectomy.   She was hospitalized again 2 weeks ago for abdominal pain.  Abdominal CT demonstrated perirenal stranding and she has f/u with oncology and is scheduled for PET scan soon to r/o CA.    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 Yes: NPRS scale: 2/10 now, 8/10 worst Pain location: R UE going down arm  Pain description: tingling, sorness   Aggravating factors: working and sitting thru the day at computer, [employer] Relieving factors: stretching neck into sidebending  PRECAUTIONS: h/o ACDF  RED FLAGS: None   WEIGHT BEARING RESTRICTIONS: No  FALLS:  Has patient fallen in last 6 months? 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  DIAGNOSTIC FINDINGS:  N/a  PATIENT SURVEYS:  Quick Dash: 34.1%  COGNITION: Overall cognitive status: Within functional limits for tasks assessed     SENSATION: WFL  POSTURE: Forward head rounded shoulders   CERVICAL ROM: 08/20/24  Active ROM A/PROM  08/20/24  Flexion 85%  Extension 75%  Right lateral flexion 50%  Left lateral flexion 60%  Right rotation 75%  Left rotation 75%   (Blank rows = not tested)   UPPER EXTREMITY ROM:   Active ROM Right eval Left eval R 08/02/24 RUE 08/20/24  Shoulder flexion 180 p 180 180- mild tingling 180  Shoulder extension 55 60    Shoulder abduction 175 p 175 170 175  Shoulder adduction Reach to opposite shoulder Reach to opposite shoulder    Shoulder internal rotation To L1 To T10 T7   Shoulder external rotation To C7 p To T2 To T3   Elbow flexion      Elbow extension      Wrist flexion      Wrist extension      Wrist ulnar deviation      Wrist radial deviation      Wrist pronation      Wrist supination      (Blank rows = not tested)  UPPER EXTREMITY MMT:  MMT Right eval Left eval RUE 08/20/24 LUE  08/20/24 RUE 09/10/24 LUE 09/10/24 R/L 09/19/24  Shoulder flexion 4   p 5 5 5 5 5    Shoulder extension 4- 4+   4+ 5   Shoulder abduction 4 p 4+ 5 5 5 5    Shoulder adduction         Shoulder internal rotation 4 4+ 5 5 5 5    Shoulder external rotation 4- 4 4 4+     Middle trapezius    4 4 4  4+/4  Lower trapezius    4- 4- 4 4-/4  Elbow flexion 4 5       Elbow extension 4 4       Wrist flexion         Wrist extension         Wrist ulnar deviation         Wrist radial deviation         Wrist pronation         Wrist supination         Grip strength (lbs) 10 kg 8 kg   8kg 8kg    Pinch strength     2kg 4kg   (Blank rows = not tested)  SHOULDER SPECIAL TESTS: Impingement tests: Hawkins/Kennedy impingement test: positive  SLAP lesions: NT Instability tests: NT Rotator cuff assessment: Infraspinatus test: negative Biceps assessment: NT  NECK SPECIAL TESTS:  Spurling's is negative bilaterally.  Compression is negative, distraction feel good, but no impact on RUE symptoms  JOINT MOBILITY TESTING:  WNL for R shoulder    PALPATION:  TTP over R UT and levator with trigger points in both  TREATMENT DATE:  09/19/24 THERAPEUTIC EXERCISE: To improve strength.  Demonstration, verbal and tactile cues throughout for technique. UBE L1.0 x 3 F/ 3 B Seated cervical ext with  pillowcase 10x3; B rotation x 10 B NEUROMUSCULAR RE-EDUCATION: To improve kinesthesia and posture. Prone R shoulder HABD x 15; lower trap raise x 15 ; shoulder extension x 20; rows 5lb x 20  Standing shoulder rolls 5lb x 20 Bent over kelso shrug 5lb x 10 B Wall push with orange pball x 20  09/10/24 THERAPEUTIC EXERCISE: To improve strength.  Demonstration, verbal and tactile cues throughout for technique. UBE L2.5 x 5' F/ #' B  THERAPEUTIC ACTIVITIES: To improve functional performance.  Demonstration, verbal and tactile cues throughout for technique. Strength recheck Prone cervical retraction x 20 POE w/ cervical retraction x 20 Prone HABD thumb up x 20 BUE Prone ER at 90 deg x 20 BUE Prone row at 90 deg abduction x 20 BUE Prone LT raise x 20 BUE Doorway pec minor shoulder extension stretch x 1'  BUE Doorway pec stretch at 90 deg x 1' BUE  NEUROMUSCULAR RE-EDUCATION: To improve kinesthesia and posture. Supine foam roll pec stretch with scapular retraction x 20 Supine foam roll alternating shoulder flexion x 30 BUE   09/05/24 THERAPEUTIC EXERCISE: To improve  strength.  Demonstration, verbal and tactile cues throughout for technique. UBE L2.5 x 5' F/ #' B  THERAPEUTIC ACTIVITIES: To improve functional performance.  Demonstration, verbal and tactile cues throughout for technique. Seated Lat PD x 25# x 2/15 BUE Seated row 25# close grip x 15;  wide grip x 15 BUE  NEUROMUSCULAR RE-EDUCATION: To improve kinesthesia and posture. Kneeling over [employer], W's, and arrows 1# x 20 BUE;  Y's with no weight x 20 BUE,   MANUAL THERAPY: To promote increased ROM utilizing joint mobilization. Grade 3-4 jt mobs to T2-T8 P/A glides x 20-30 reps each level Deep pressure Tp release to R rhomboid/MT area  08/20/24 THERAPEUTIC EXERCISE: To improve strength.  Demonstration, verbal and tactile cues throughout for technique. UBE L2.5 x 5' F/ #' B  THERAPEUTIC ACTIVITIES: To improve functional performance.  Demonstration, verbal and tactile cues throughout for technique. Prone shoulder ext 2# x 20  Prone shoulder HABD 0# x 20 Prone shoulder LT raise 0# x 20 Lat PD 25# x 20 Rowing blue and black TB together x 30  NEUROMUSCULAR RE-EDUCATION: To improve kinesthesia and posture. Red swiss ball Y's, T's, arrows 0# x 20 BUE  MANUAL THERAPY: To promote reduced pain utilizing manual TP therapy. Tp deep pressure to R rhomboids along medial scapular border  08/14/24: therex:  Nustep L 6 UE's and LE's x 6 min Side lying for shoulder ER R 1#, L 2 # 20 reps Side lying for thoracic rotation, open books 15 reps each side Seated for chest press hands horizontal 5# 15 reps Seated rows lower hands 15 reps 15# Seated rows high hands 15 reps 15 # Prone for shoulder ext 3# L, 0# R 20 reps Prone horizontal shoulder abd 0# each 20 reps  PATIENT EDUCATION:  Education details: Continuing PT x 1w4 with focus on strengthening scapular musculature  Person educated: Patient Education method: Explanation, Demonstration, Verbal cues, Tactile cues, and MedBridgeGO app  access provided Education comprehension: verbal cues required, tactile cues required, and needs further education   HOME EXERCISE PROGRAM: Access Code: [email or web address] URL: [email or web address] Date: 09/19/2024 Prepared by: Gresham Caetano  Exercises - Seated Upper Trapezius Stretch  - 1 x daily -  7 x weekly - 2 sets - 2 reps - 1 min hold - Seated Levator Scapulae Stretch  - 1 x daily - 7 x weekly - 2 sets - 1 reps - 1 min hold - Seated Thoracic Lumbar Extension with Pectoralis Stretch  - 3 x daily - 7 x weekly - 1 sets - 10 reps - 5 sec hold - Prone Shoulder Extension - Single Arm with Dumbbell  - 1 x daily - 7 x weekly - 3 sets - 10 reps - Prone Cervical Retraction  - 1 x daily - 7 x weekly - 3 sets - 10 reps - Cervical Retraction Prone on Elbows  - 1 x daily - 7 x weekly - 3 sets - 10 reps - Prone Single Arm Shoulder Horizontal Abduction with Dumbbell  - 1 x daily - 7 x weekly - 3 sets - 10 reps - Prone Shoulder Row in Abduction  - 1 x daily - 7 x weekly - 3 sets - 10 reps - Prone Scapular Retraction with Shoulder External Rotation in Abduction  - 1 x daily - 7 x weekly - 3 sets - 10 reps - Prone Single Arm Shoulder Y  - 1 x daily - 7 x weekly - 3 sets - 10 reps - Supine Chest Stretch on Foam Roll  - 1 x daily - 7 x weekly - 3 sets - 10 reps - [place] on Foam Roll  - 1 x daily - 7 x weekly - 3 sets - 10 reps - Overhead Reach on Foam Roll  - 1 x daily - 7 x weekly - 3 sets - 10 reps - Doorway Pec Stretch at 90 Degrees Abduction  - 1 x daily - 7 x weekly - 1 sets - 1 reps - 1 min hold - Doorway Pec Stretch at 60 Elevation  - 1 x daily - 7 x weekly - 1 sets - 1 reps - 1 min hold - Wall Push Up  - 1 x daily - 7 x weekly - 3 sets - 10 reps  ASSESSMENT:  CLINICAL IMPRESSION:  Pt has shown progress with UE ROM, Quick Dash, and some improvement in UE strength. After discussion with PT, she reports an appointment with Dr. Onetha about recent MRI results, will extend PT for another  visit and wait to see what her surgeon says. Will anticipate transition to HEP next week.  Eval:Patient is a 53 y.o. female referred  for physical therapy evaluation and treatment for R neck pain and R trapezius strain by PCP.  She demonstrates RUE weakness and stiffness throughout the R neck, shoulder, and elbow with postural deficits, trigger points in the R UT and levator with pain and also tingling intermittently into the RUE.   Patient is cleared for PT and would benefit from PT to address the above deficits.   She has good rehab potential to decreased her pain and improve her cervical ROM, shoulder/scapular strength and stability and be able to work painfree.   She has had PT here in past for this problem with good relief and is highly motivated to improve her condition.    OBJECTIVE IMPAIRMENTS: decreased knowledge of condition, decreased ROM, decreased strength, increased fascial restrictions, increased muscle spasms, impaired flexibility, impaired sensation, impaired UE functional use, improper body mechanics, postural dysfunction, and pain.   ACTIVITY LIMITATIONS: lifting, sitting, reach over head, and hygiene/grooming  PARTICIPATION LIMITATIONS: cleaning, laundry, and occupation  PERSONAL FACTORS: Profession and 1-2 comorbidities: ACDF, OA, migraines,  HTN  are also affecting patient's functional outcome.   REHAB POTENTIAL: Good  CLINICAL DECISION MAKING: Evolving/moderate complexity  EVALUATION COMPLEXITY: Moderate   GOALS: Goals reviewed with patient? Yes  SHORT TERM GOALS: Target date: 07/24/24  Patient will be independent with initial HEP.  Baseline: 100% PT assistance required for correct completion  08/20/24:  can return/demo all HEP Goal status: MET  2.  Patient will report 50% subjective improvement in pain as indicated by 4/10 worst pain in the neck/R UT   Baseline: 8/10 worst  Goal status: MET- 07/18/24    LONG TERM GOALS: Target date: 09/21/24   Patient will be  independent with advanced/ongoing HEP to improve outcomes and carryover.  Baseline: 100% PT assistance required for initial HEP, nothing advanced yet 08/20/24:  Patient can teach back all HEP Goal status: MET  2.  Patient will report 100% improvement in R shoulder pain to improve QOL.  Baseline: 8/10 worst pain 08/20/24:  1/10 worst Goal status: MET  3.  Patient to improve R shoulder AROM to [facility] without pain provocation to allow for increased ease of ADLs.  Baseline: 55 degrees R shoulder ER  Goal status: MET- 09/19/24  4.  Patient will demonstrate improved functional UE strength as demonstrated by 5/5 R shoulder strength for ability to do haircare and household and recreational activities without needing rest . Baseline: unable to complete without stopping for rest 08/20/24: Strength testing today reveals 4 to 4+ strength in scapular stabilizers of the R shoulder Goal status: IN PROGRESS- 09/19/24  5.  Patient will report 10 points improvement on QuickDash to demonstrate improved functional ability.  Baseline: 34.1 08/20/24:  40 Goal status: MET- QuickDASH Score: 18.2 / 100 = 18.2 % 09/19/24   PLAN:  PT FREQUENCY: 2x/week  PT DURATION: 8 weeks  PLANNED INTERVENTIONS: 97164- PT Re-evaluation, 97750- Physical Performance Testing, 97110-Therapeutic exercises, 97530- Therapeutic activity, 97112- Neuromuscular re-education, 97535- Self Care, 02859- Manual therapy, [id number]- Electrical stimulation (unattended), 97016- Vasopneumatic device, 97035- Ultrasound, 02966- Ionotophoresis 4mg /ml Dexamethasone , Patient/Family education, Taping, Joint mobilization, Spinal mobilization, DME instructions, Cryotherapy, Moist heat, and Biofeedback  PLAN FOR NEXT SESSION: extend 1 more visit; see what Dr. Onetha says; anticipate transition to HEP   Sol LITTIE Gaskins, PTA,  09/19/2024, 2:04 PM   PHYSICAL THERAPY DISCHARGE SUMMARY  Visits from Start of Care: 13   Current functional level related to goals /  functional outcomes: Patient completed 13 visits with PT for neck pain and LUE weakness.   She had one PT visit remaining, but did not return for her final visit.   She was going to f/u with her neurosurgeon, Dr Onetha, who did her neck fusion, regarding her cervical MRI results.   We do not know the results of this visit.  Hopefully she is doing well   Remaining deficits: As per above notes   Education / Equipment: Patient is independent with all home exercises and advised to continue daily as tolerated and call us  with any questions   Patient agrees to discharge. Patient goals were partially met. Patient is being discharged due to not returning since the last visit.  "

## 2024-09-21 DIAGNOSIS — G629 Polyneuropathy, unspecified: Secondary | ICD-10-CM | POA: Diagnosis not present

## 2024-09-21 DIAGNOSIS — M542 Cervicalgia: Secondary | ICD-10-CM | POA: Diagnosis not present

## 2024-09-24 ENCOUNTER — Ambulatory Visit: Admitting: Rehabilitation

## 2024-09-26 DIAGNOSIS — Z1231 Encounter for screening mammogram for malignant neoplasm of breast: Secondary | ICD-10-CM | POA: Diagnosis not present

## 2024-09-26 DIAGNOSIS — Z01419 Encounter for gynecological examination (general) (routine) without abnormal findings: Secondary | ICD-10-CM | POA: Diagnosis not present

## 2024-09-26 DIAGNOSIS — Z6829 Body mass index (BMI) 29.0-29.9, adult: Secondary | ICD-10-CM | POA: Diagnosis not present

## 2024-10-05 ENCOUNTER — Other Ambulatory Visit: Payer: Self-pay | Admitting: Medical

## 2024-10-05 ENCOUNTER — Encounter: Payer: Self-pay | Admitting: Medical

## 2024-10-09 ENCOUNTER — Telehealth: Admitting: Physician Assistant

## 2024-10-09 DIAGNOSIS — J019 Acute sinusitis, unspecified: Secondary | ICD-10-CM | POA: Diagnosis not present

## 2024-10-09 DIAGNOSIS — B9689 Other specified bacterial agents as the cause of diseases classified elsewhere: Secondary | ICD-10-CM | POA: Diagnosis not present

## 2024-10-09 MED ORDER — AMOXICILLIN-POT CLAVULANATE 875-125 MG PO TABS: 1.0000 | ORAL_TABLET | Freq: Two times a day (BID) | ORAL | 0 refills | Status: DC

## 2024-10-09 NOTE — Progress Notes (Signed)
 E-Visit for Sinus Problems  We are sorry that you are not feeling well.  Here is how we plan to help!  Based on what you have shared with me it looks like you have sinusitis.  Sinusitis is inflammation and infection in the sinus cavities of the head.  Based on your presentation I believe you most likely have Acute Bacterial Sinusitis.  This is an infection caused by bacteria and is treated with antibiotics. I have prescribed Augmentin 875mg /125mg  one tablet twice daily with food, for 7 days. You may use an oral decongestant such as Mucinex D or if you have glaucoma or high blood pressure use plain Mucinex. Saline nasal spray help and can safely be used as often as needed for congestion.  If you develop worsening sinus pain, fever or notice severe headache and vision changes, or if symptoms are not better after completion of antibiotic, please schedule an appointment with a health care provider.    Sinus infections are not as easily transmitted as other respiratory infection, however we still recommend that you avoid close contact with loved ones, especially the very young and elderly.  Remember to wash your hands thoroughly throughout the day as this is the number one way to prevent the spread of infection!  Home Care: Only take medications as instructed by your medical team. Complete the entire course of an antibiotic. Do not take these medications with alcohol. A steam or ultrasonic humidifier can help congestion.  You can place a towel over your head and breathe in the steam from hot water coming from a faucet. Avoid close contacts especially the very young and the elderly. Cover your mouth when you cough or sneeze. Always remember to wash your hands.  Get Help Right Away If: You develop worsening fever or sinus pain. You develop a severe head ache or visual changes. Your symptoms persist after you have completed your treatment plan.  Make sure you Understand these instructions. Will  watch your condition. Will get help right away if you are not doing well or get worse.  Your e-visit answers were reviewed by a board certified advanced clinical practitioner to complete your personal care plan.  Depending on the condition, your plan could have included both over the counter or prescription medications.  If there is a problem please reply  once you have received a response from your provider.  Your safety is important to us .  If you have drug allergies check your prescription carefully.    You can use MyChart to ask questions about today's visit, request a non-urgent call back, or ask for a work or school excuse for 24 hours related to this e-Visit. If it has been greater than 24 hours you will need to follow up with your provider, or enter a new e-Visit to address those concerns.  You will get an e-mail in the next two days asking about your experience.  I hope that your e-visit has been valuable and will speed your recovery. Thank you for using e-visits.  I have spent 5 minutes in review of e-visit questionnaire, review and updating patient chart, medical decision making and response to patient.   [name], PA-C

## 2024-10-11 ENCOUNTER — Ambulatory Visit (INDEPENDENT_AMBULATORY_CARE_PROVIDER_SITE_OTHER): Admitting: Neurology

## 2024-10-11 DIAGNOSIS — R202 Paresthesia of skin: Secondary | ICD-10-CM

## 2024-10-11 NOTE — Procedures (Signed)
 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  NCV & EMG Findings: Extensive electrodiagnostic testing of the right upper extremity and additional studies of the left shows: Bilateral median, ulnar, and mixed palmar sensory responses are within normal limits. Bilateral median and ulnar motor responses are within normal limits. There is no evidence of active or chronic motor axonal loss changes affecting any of the tested muscles.  Motor unit configuration and recruitment pattern is within normal limits.  Impression: This is a normal study of the upper extremities.  In particular, there is no evidence of carpal tunnel syndrome or a cervical radiculopathy.   ___________________________ Tonita Blanch, DO    Nerve Conduction Studies   Stim Site NR Peak (ms) Norm Peak (ms) O-P Amp (V) Norm O-P Amp  Left Median Anti Sensory (2nd Digit)  32 C  Wrist    3.0 <3.6 41.6 >15  Right Median Anti Sensory (2nd Digit)  32 C  Wrist    2.8 <3.6 41.2 >15  Left Ulnar Anti Sensory (5th Digit)  32 C  Wrist    2.6 <3.1 28.2 >10  Right Ulnar Anti Sensory (5th Digit)  32 C  Wrist    2.5 <3.1 33.3 >10     Stim Site NR Onset (ms) Norm Onset (ms) O-P Amp (mV) Norm O-P Amp Site1 Site2 Delta-0 (ms) Dist (cm) Vel (m/s) Norm Vel (m/s)  Left Median Motor (Abd Poll Brev)  32 C  Wrist    3.3 <4.0 8.8 >6 Elbow Wrist 4.5 29.0 64 >50  Elbow    7.8  8.4         Right Median Motor (Abd Poll Brev)  32 C  Wrist    3.3 <4.0 8.2 >6 Elbow Wrist 4.4 28.0 64 >50  Elbow    7.7  8.0         Left Ulnar Motor (Abd Dig Minimi)  32 C  Wrist    2.0 <3.1  9.7 >7 B Elbow Wrist 3.4 21.0 62 >50  B Elbow    5.4  9.3  A Elbow B Elbow 1.8 10.0 56 >50  A Elbow    7.2  9.1         Right Ulnar Motor (Abd Dig Minimi)  32 C  Wrist    2.0 <3.1 9.7 >7 B Elbow Wrist 3.0 20.0 67 >50  B Elbow    5.0  9.6  A Elbow B Elbow 1.6 10.0 63 >50  A Elbow    6.6  9.7            Stim Site NR Peak (ms) Norm Peak (ms) P-T Amp (V) Site1 Site2 Delta-P (ms) Norm Delta (ms)  Left Median/Ulnar Palm Comparison (Wrist - 8cm)  32 C  Median Palm    1.8 <2.2 35.3 Median Palm Ulnar Palm 0.3   Ulnar Palm    1.5 <2.2 10.1      Right Median/Ulnar Palm Comparison (Wrist - 8cm)  32 C  Median Palm    1.6 <2.2 51.7 Median Palm Ulnar Palm 0.0   Ulnar Palm    1.6 <2.2 12.9  Electromyography   Side Muscle Ins.Act Fibs Fasc Recrt Amp Dur Poly Activation Comment  Right 1stDorInt Nml Nml Nml Nml Nml Nml Nml Nml N/A  Right PronatorTeres Nml Nml Nml Nml Nml Nml Nml Nml N/A  Right Biceps Nml Nml Nml Nml Nml Nml Nml Nml N/A  Right Triceps Nml Nml Nml Nml Nml Nml Nml Nml N/A  Right Deltoid Nml Nml Nml Nml Nml Nml Nml Nml N/A  Left 1stDorInt Nml Nml Nml Nml Nml Nml Nml Nml N/A  Left PronatorTeres Nml Nml Nml Nml Nml Nml Nml Nml N/A  Left Biceps Nml Nml Nml Nml Nml Nml Nml Nml N/A  Left Triceps Nml Nml Nml Nml Nml Nml Nml Nml N/A  Left Deltoid Nml Nml Nml Nml Nml Nml Nml Nml N/A      Waveforms:

## 2024-10-25 ENCOUNTER — Ambulatory Visit (INDEPENDENT_AMBULATORY_CARE_PROVIDER_SITE_OTHER): Admitting: Neurology

## 2024-10-25 DIAGNOSIS — R202 Paresthesia of skin: Secondary | ICD-10-CM | POA: Diagnosis not present

## 2024-10-25 NOTE — Procedures (Signed)
 [facility]  [address] [place], [address]  [place], [place] [address] Tel: [phone] Fax: [phone] Test Date:  10/25/2024  Patient: Ann Frederick DOB: 09/19/1971 Physician: Tonita Blanch, DO  Sex: Female Height: 2' 2 Ref Phys: Arley Helling, MD  ID#: [id number]   Technician:    History: This is a 53 year old female referred for evaluation of bilateral lower extremity paresthesias.  NCV & EMG Findings: Electrodiagnostic testing of the right lower extremity and additional studies of the left shows: Bilateral sural and superficial peroneal sensory responses are within normal limits. Bilateral peroneal and tibial motor responses are within normal limits. Bilateral tibial H reflex studies are within normal limits. There is no evidence of active or chronic motor axonal changes affecting any of the tested muscles.  Motor unit configuration and recruitment pattern is within normal limits.  Impression: This is a normal study of the lower extremities.  In particular, there is no evidence of a large fiber sensorimotor polyneuropathy or lumbosacral radiculopathy.    ___________________________ Tonita Blanch, DO    Nerve Conduction Studies   Stim Site NR Peak (ms) Norm Peak (ms) O-P Amp (V) Norm O-P Amp  Left Sup Peroneal Anti Sensory (Ant Lat Mall)  32 C  12 cm    2.1 <4.6 11.9 >4  Right Sup Peroneal Anti Sensory (Ant Lat Mall)  32 C  12 cm    2.4 <4.6 10.7 >4  Left Sural Anti Sensory (Lat Mall)  32 C  Calf    2.6 <4.6 21.2 >4  Right Sural Anti Sensory (Lat Mall)  32 C  Calf    2.3 <4.6 21.9 >4     Stim Site NR Onset (ms) Norm Onset (ms) O-P Amp (mV) Norm O-P Amp Site1 Site2 Delta-0 (ms) Dist (cm) Vel (m/s) Norm Vel (m/s)  Left Peroneal Motor (Ext Dig Brev)  32 C  Ankle    3.9 <6.0 5.5 >2.5 B Fib Ankle 6.4 34.0 53 >40  B Fib    10.3  5.3  Poplt B Fib 1.3 8.0 62 >40  Poplt    11.6  5.3         Right Peroneal Motor (Ext Dig Brev)  32 C  Ankle    4.3 <6.0 4.9 >2.5  B Fib Ankle 6.0 33.0 55 >40  B Fib    10.3  4.9  Poplt B Fib 1.7 10.0 59 >40  Poplt    12.0  4.8         Left Tibial Motor (Abd Hall Brev)  32 C  Ankle    3.6 <6.0 13.4 >4 Knee Ankle 7.4 38.0 51 >40  Knee    11.0  12.7         Right Tibial Motor (Abd Hall Brev)  32 C  Ankle    4.6 <6.0 12.5 >4 Knee Ankle 6.0 37.0 62 >40  Knee    10.6  10.7          Electromyography   Side Muscle Ins.Act Fibs Fasc Recrt Amp Dur Poly Activation Comment  Right AntTibialis Nml Nml Nml Nml Nml Nml Nml Nml N/A  Right Gastroc Nml Nml Nml Nml Nml Nml Nml Nml N/A  Right Flex Dig Long Nml Nml Nml Nml Nml Nml Nml Nml N/A  Right RectFemoris Nml Nml Nml Nml Nml Nml Nml Nml N/A  Right GluteusMed Nml Nml Nml Nml Nml Nml Nml Nml N/A  Left AntTibialis Nml Nml Nml Nml Nml Nml Nml Nml N/A  Left  Gastroc Nml Nml Nml Nml Nml Nml Nml Nml N/A  Left Flex Dig Long Nml Nml Nml Nml Nml Nml Nml Nml N/A  Left RectFemoris Nml Nml Nml Nml Nml Nml Nml Nml N/A  Left GluteusMed Nml Nml Nml Nml Nml Nml Nml Nml N/A      Waveforms:

## 2024-11-06 DIAGNOSIS — G8929 Other chronic pain: Secondary | ICD-10-CM | POA: Diagnosis not present

## 2024-11-06 DIAGNOSIS — M542 Cervicalgia: Secondary | ICD-10-CM | POA: Diagnosis not present

## 2024-11-06 DIAGNOSIS — M25511 Pain in right shoulder: Secondary | ICD-10-CM | POA: Diagnosis not present

## 2024-11-07 ENCOUNTER — Ambulatory Visit: Payer: Self-pay

## 2024-11-07 ENCOUNTER — Encounter: Payer: Self-pay | Admitting: Family Medicine

## 2024-11-07 ENCOUNTER — Ambulatory Visit (INDEPENDENT_AMBULATORY_CARE_PROVIDER_SITE_OTHER): Admitting: Family Medicine

## 2024-11-07 VITALS — BP 153/83 | HR 76 | Ht 62.0 in | Wt 163.0 lb

## 2024-11-07 DIAGNOSIS — I10 Essential (primary) hypertension: Secondary | ICD-10-CM

## 2024-11-07 MED ORDER — LOSARTAN POTASSIUM 50 MG PO TABS: 50.0000 mg | ORAL_TABLET | Freq: Every day | ORAL | 1 refills | Status: AC

## 2024-11-07 NOTE — Progress Notes (Signed)
 Acute Office Visit  Subjective:     Patient ID: Ann Frederick, female    DOB: Nov 12, 1971, 53 y.o.   MRN: [id number]  Chief Complaint  Patient presents with   Hypertension    Hypertension   Patient is in today for HTN.    Discussed the use of AI scribe software for clinical note transcription with the patient, who gave verbal consent to proceed.  History of Present Illness Ann Frederick is a 53 year old female with hypertension who presents with elevated blood pressure.  She has a history of hypertension, previously well-controlled on hydrochlorothiazide . In June, she experienced an episode of acute kidney injury, prompting a switch to losartan . Since the medication change, her blood pressure has increased, with readings in the 140s to 150s, compared to the 110s when on hydrochlorothiazide .  She is currently on a low dose of losartan  and has not experienced any side effects.  No chest pain, shortness of breath, blurred vision, or headaches.  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          ROS All review of systems negative except what is listed in the HPI      Objective:    BP (!) 153/83   Pulse 76   Ht 5' 2 (1.575 m)   Wt 163 lb (73.9 kg)   LMP 02/11/2016 (Exact Date)   SpO2 99%   BMI 29.81 kg/m    Physical Exam Vitals reviewed.  Constitutional:      Appearance: Normal appearance.  Cardiovascular:     Rate and Rhythm: Normal rate and regular rhythm.     Heart sounds: Normal heart sounds.  Pulmonary:     Effort: Pulmonary effort is normal.     Breath sounds: Normal breath sounds.  Skin:    General: Skin is warm and dry.  Neurological:     Mental Status: She is alert and oriented to person, place, and time.  Psychiatric:        Mood and Affect: Mood normal.        Behavior: Behavior normal.        Thought Content: Thought content normal.        Judgment: Judgment normal.         No results  found for any visits on 11/07/24.      Assessment & Plan:   Problem List Items Addressed This Visit       Active Problems   HTN (hypertension) - Primary   Blood pressure is not at goal for age and co-morbidities.   Recommendations: increase losartan  to 50 mg daily, nurse BP check and lab appt (BMP) in 2-4 weeks - BP goal <130/80 - monitor and log blood pressures at home - check around the same time each day in a relaxed setting - Limit salt to <2000 mg/day - Follow DASH eating plan (heart healthy diet) - limit alcohol to 2 standard drinks per day for men and 1 per day for women - avoid tobacco products - get at least 2 hours of regular aerobic exercise weekly Patient aware of signs/symptoms requiring further/urgent evaluation.      Relevant Medications   losartan  (COZAAR ) 50 MG tablet   Other Relevant Orders   Basic Metabolic Panel (BMET)       Meds ordered this encounter  Medications   losartan  (COZAAR ) 50 MG tablet    Sig: Take 1 tablet (50 mg total) by mouth  daily.    Dispense:  90 tablet    Refill:  1    Supervising Provider:   DOMENICA BLACKBIRD A [[id number]]    Return for BP check with nurse, lab appt (BMP) 2-4 weeks.  Waddell KATHEE Mon, NP

## 2024-11-07 NOTE — Telephone Encounter (Signed)
 FYI Only or Action Required?: FYI only for provider: appointment scheduled on 11/07/24.  Patient was last seen in primary care on 08/31/2024 by Saguier, Edward, PA-C.  Called Nurse Triage reporting Hypertension.  Symptoms began several days ago.  Interventions attempted: Prescription medications: Losartan .  Symptoms are: unchanged.  Triage Disposition: See PCP When Office is Open (Within 3 Days)  Patient/caregiver understands and will follow disposition?: Yes   Copied from CRM #[id number]. Topic: Clinical - Red Word Triage >> Nov 07, 2024  9:55 AM China J wrote: [facility] that prompted transfer to Nurse Triage: Patient has been advised to speak with a nurse per CAL. In her recent appointment notes it states that her blood pressure has been in the 140-150s. Reason for Disposition  Systolic BP >= 160 OR Diastolic >= 100  Answer Assessment - Initial Assessment Questions No available appts with pcp. Scheduled with alternative provider, 11/07/24.  Advised call back if symptoms worsen.  1. BLOOD PRESSURE: What is your blood pressure? Did you take at least two measurements 5 minutes apart?     158/101 hr 79; taken 15 minutes; Losartan ; haven't taken any meds, usually takes at noon 2. ONSET: When did you take your blood pressure?     today 3. HOW: How did you take your blood pressure? (e.g., automatic home BP monitor, visiting nurse)     Auto BP 4. HISTORY: Do you have a history of high blood pressure?     htn 5. MEDICINES: Are you taking any medicines for blood pressure? Have you missed any doses recently?     losartan  6. OTHER SYMPTOMS: Do you have any symptoms? (e.g., blurred vision, chest pain, difficulty breathing, headache, weakness)  denies Ha, blurred vision, diff breath chest pain  Protocols used: Blood Pressure - High-A-AH

## 2024-11-07 NOTE — Patient Instructions (Addendum)
 Blood pressure is not at goal for age and co-morbidities.   Recommendations: increase losartan  to 50 mg daily, nurse BP check and lab appt (BMP) in 2-4 weeks - BP goal <130/80 - monitor and log blood pressures at home - check around the same time each day in a relaxed setting - Limit salt to <2000 mg/day - Follow DASH eating plan (heart healthy diet) - limit alcohol to 2 standard drinks per day for men and 1 per day for women - avoid tobacco products - get at least 2 hours of regular aerobic exercise weekly Patient aware of signs/symptoms requiring further/urgent evaluation.

## 2024-11-07 NOTE — Assessment & Plan Note (Signed)
 Blood pressure is not at goal for age and co-morbidities.   Recommendations: increase losartan  to 50 mg daily, nurse BP check and lab appt (BMP) in 2-4 weeks - BP goal <130/80 - monitor and log blood pressures at home - check around the same time each day in a relaxed setting - Limit salt to <2000 mg/day - Follow DASH eating plan (heart healthy diet) - limit alcohol to 2 standard drinks per day for men and 1 per day for women - avoid tobacco products - get at least 2 hours of regular aerobic exercise weekly Patient aware of signs/symptoms requiring further/urgent evaluation.

## 2024-11-07 NOTE — Telephone Encounter (Signed)
 Appointment scheduled for today 11/07/24

## 2024-11-12 ENCOUNTER — Ambulatory Visit: Admitting: Medical

## 2024-11-20 ENCOUNTER — Other Ambulatory Visit: Payer: Self-pay | Admitting: Medical

## 2024-11-28 ENCOUNTER — Other Ambulatory Visit: Payer: Self-pay | Admitting: Medical

## 2024-11-29 ENCOUNTER — Inpatient Hospital Stay: Attending: Hematology & Oncology

## 2024-11-29 ENCOUNTER — Encounter: Payer: Self-pay | Admitting: Medical Oncology

## 2024-11-29 ENCOUNTER — Inpatient Hospital Stay: Admitting: Medical Oncology

## 2024-11-29 VITALS — BP 100/64 | HR 93 | Temp 97.7°F | Resp 18 | Ht 62.0 in | Wt 163.1 lb

## 2024-11-29 DIAGNOSIS — Z7982 Long term (current) use of aspirin: Secondary | ICD-10-CM | POA: Insufficient documentation

## 2024-11-29 DIAGNOSIS — D75839 Thrombocytosis, unspecified: Secondary | ICD-10-CM

## 2024-11-29 DIAGNOSIS — D649 Anemia, unspecified: Secondary | ICD-10-CM

## 2024-11-29 DIAGNOSIS — D509 Iron deficiency anemia, unspecified: Secondary | ICD-10-CM | POA: Insufficient documentation

## 2024-11-29 DIAGNOSIS — D473 Essential (hemorrhagic) thrombocythemia: Secondary | ICD-10-CM | POA: Insufficient documentation

## 2024-11-29 LAB — CMP (CANCER CENTER ONLY)
ALT: 17 U/L (ref 0–44)
AST: 16 U/L (ref 15–41)
Albumin: 4.3 g/dL (ref 3.5–5.0)
Alkaline Phosphatase: 51 U/L (ref 38–126)
Anion gap: 11 (ref 5–15)
BUN: 7 mg/dL (ref 6–20)
CO2: 25 mmol/L (ref 22–32)
Calcium: 9.2 mg/dL (ref 8.9–10.3)
Chloride: 105 mmol/L (ref 98–111)
Creatinine: 1.08 mg/dL — ABNORMAL HIGH (ref 0.44–1.00)
GFR, Estimated: >60 mL/min (ref >60)
Glucose, Bld: 117 mg/dL — ABNORMAL HIGH (ref 70–99)
Potassium: 4 mmol/L (ref 3.5–5.1)
Sodium: 141 mmol/L (ref 135–145)
Total Bilirubin: 0.5 mg/dL (ref 0.0–1.2)
Total Protein: 6.9 g/dL (ref 6.5–8.1)

## 2024-11-29 LAB — CBC WITH DIFFERENTIAL (CANCER CENTER ONLY)
Abs Immature Granulocytes: 0.05 K/uL (ref 0.00–0.07)
Basophils Absolute: 0.1 K/uL (ref 0.0–0.1)
Basophils Relative: 1 %
Eosinophils Absolute: 0.2 K/uL (ref 0.0–0.5)
Eosinophils Relative: 2 %
HCT: 38.1 % (ref 36.0–46.0)
Hemoglobin: 12.9 g/dL (ref 12.0–15.0)
Immature Granulocytes: 1 %
Lymphocytes Relative: 28 %
Lymphs Abs: 2.5 K/uL (ref 0.7–4.0)
MCH: 31.2 pg (ref 26.0–34.0)
MCHC: 33.9 g/dL (ref 30.0–36.0)
MCV: 92 fL (ref 80.0–100.0)
Monocytes Absolute: 0.3 K/uL (ref 0.1–1.0)
Monocytes Relative: 4 %
Neutro Abs: 5.6 K/uL (ref 1.7–7.7)
Neutrophils Relative %: 64 %
Platelet Count: 430 K/uL — ABNORMAL HIGH (ref 150–400)
RBC: 4.14 MIL/uL (ref 3.87–5.11)
RDW: 12.5 % (ref 11.5–15.5)
WBC Count: 8.7 K/uL (ref 4.0–10.5)
nRBC: 0 % (ref 0.0–0.2)

## 2024-11-29 LAB — IRON AND IRON BINDING CAPACITY (CC-WL,HP ONLY)
Iron: 98 ug/dL (ref 28–170)
Saturation Ratios: 28 % (ref 10.4–31.8)
TIBC: 347 ug/dL (ref 250–450)
UIBC: 249 ug/dL

## 2024-11-29 LAB — RETIC PANEL
Immature Retic Fract: 6.6 % (ref 2.3–15.9)
RBC.: 4.14 MIL/uL (ref 3.87–5.11)
Retic Count, Absolute: 72.9 K/uL (ref 19.0–186.0)
Retic Ct Pct: 1.8 % (ref 0.4–3.1)
Reticulocyte Hemoglobin: 35.2 pg (ref >27.9)

## 2024-11-29 LAB — LACTATE DEHYDROGENASE: LDH: 136 U/L (ref 105–235)

## 2024-11-29 LAB — VITAMIN B12: Vitamin B-12: 1305 pg/mL — ABNORMAL HIGH (ref 180–914)

## 2024-11-29 LAB — FERRITIN: Ferritin: 47 ng/mL (ref 11–307)

## 2024-11-29 LAB — SAVE SMEAR(SSMR), FOR PROVIDER SLIDE REVIEW

## 2024-11-29 LAB — FOLATE: Folate: 11.5 ng/mL (ref >5.9)

## 2024-11-29 NOTE — Progress Notes (Signed)
 Hematology and Oncology Follow Up Visit  Ann Frederick [id number] 23-Nov-1971 53 y.o. 11/29/2024  Past Medical History:  Diagnosis Date   Acne    Anemia    history of   Anxiety    Arthritis    Depression    GERD (gastroesophageal reflux disease)    worse while pregnant   Headache    Migraines   Heart murmur    ? heart murmur in past   History of blood transfusion 2013   Hypertension    Metabolic syndrome     Principle Diagnosis:  Thrombocytosis - JAK 2 negative  Current Therapy:   81 mg asa once daily Prenatal vitamin off and on Super B Complex   Interim History:  Ms. Ridolfi is back for follow-up.   She had a CT scan on 06/09/2024 which showed bilateral perirenal stranding.  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 She was referred to our office for this as well. Fortunately on PET scan this area had completely resolved with no other concerning areas for malignancy shown.   Today she states that she is feeling well. She really has had no specific complaints.    Since her last visit she has been doing well.   She is taking B12 orally once daily as well as occasional iron supplementation   She has had no problems with dysuria.  There is been no hematuria.  She has had no fever.  She has had no rashes.  There is been no bleeding.  She has had no leg swelling.  She has had no hot flashes or sweats.  There is been no weight loss.  She is on baby aspirin  because of the thrombocytosis.  Currently, I would say performance status about ECOG 1.    Wt Readings from Last 3 Encounters:  11/29/24 163 lb 1.9 oz (74 kg)  11/07/24 163 lb (73.9 kg)  08/31/24 170 lb 12.8 oz (77.5 kg)     Medications:   Current Outpatient Medications:    aspirin  EC 81 MG tablet, Take 81 mg by mouth in the morning. Swallow whole., Disp: , Rfl:    buPROPion  (WELLBUTRIN  XL) 300 MG 24 hr tablet, Take 300 mg by mouth every morning., Disp: , Rfl:     busPIRone  (BUSPAR ) 15 MG tablet, TAKE 1 TABLET BY MOUTH TWICE A DAY, Disp: 180 tablet, Rfl: 2   Cholecalciferol (VITAMIN D3) 1000 units CAPS, Take 1,000 Units by mouth daily., Disp: , Rfl:    escitalopram (LEXAPRO) 10 MG tablet, Take 10 mg by mouth daily., Disp: , Rfl:    estradiol (ESTRACE) 2 MG tablet, Take 2 mg by mouth daily., Disp: , Rfl:    ezetimibe  (ZETIA ) 10 MG tablet, Take 1 tablet (10 mg total) by mouth daily., Disp: 90 tablet, Rfl: 3   gabapentin  (NEURONTIN ) 300 MG capsule, Take 1 capsule twice daily, then take 2 capsules at bedtime, Disp: 120 capsule, Rfl: 2   losartan  (COZAAR ) 50 MG tablet, Take 1 tablet (50 mg total) by mouth daily., Disp: 90 tablet, Rfl: 1   LYSINE PO, Take 1 capsule by mouth daily., Disp: , Rfl:    ondansetron  (ZOFRAN -ODT) 4 MG disintegrating tablet, Take 1 tablet (4 mg total) by mouth every 8 (eight) hours as needed., Disp: 8 tablet, Rfl: 0   prochlorperazine  (COMPAZINE ) 5 MG tablet, Take 1-2 tablets (5-10 mg total) by mouth every 6 (six) hours as needed for nausea, vomiting or refractory nausea / vomiting., Disp:  15 tablet, Rfl: 2   semaglutide -weight management (WEGOVY ) 0.5 MG/0.5ML SOAJ SQ injection, INJECT 0.5 MG INTO THE SKIN ONE TIME PER WEEK, Disp: 2 mL, Rfl: 1   traZODone (DESYREL) 50 MG tablet, Take 50 mg by mouth at bedtime as needed for sleep., Disp: , Rfl:    valACYclovir  (VALTREX ) 1000 MG tablet, Take 2 tablets (2,000 mg total) by mouth 2 (two) times daily. (Patient taking differently: Take 2,000 mg by mouth 2 (two) times daily. BID prn), Disp: 4 tablet, Rfl: 0   zolpidem  (AMBIEN ) 10 MG tablet, Take 5-10 mg by mouth at bedtime as needed for sleep., Disp: , Rfl:   Allergies:  Allergies  Allergen Reactions   Azithromycin  Diarrhea and Nausea And Vomiting    Past Medical History, Surgical history, Social history, and Family History were reviewed and updated.  Review of Systems: Review of Systems  Constitutional: Negative.   HENT: Negative.     Eyes: Negative.   Respiratory: Negative.    Cardiovascular: Negative.   Gastrointestinal:  Negative for abdominal pain and nausea.  Genitourinary:  Negative for dysuria and frequency.  Skin: Negative.   Neurological: Negative.   Endo/Heme/Allergies: Negative.   Psychiatric/Behavioral: Negative.       Physical Exam:  height is 5' 2 (1.575 m) and weight is 163 lb 1.9 oz (74 kg). Her oral temperature is 97.7 F (36.5 C). Her blood pressure is 100/64 and her pulse is 93. Her respiration is 18 and oxygen saturation is 98%.   Physical Exam Vitals reviewed.  HENT:     Head: Normocephalic and atraumatic.  Eyes:     Pupils: Pupils are equal, round, and reactive to light.  Cardiovascular:     Rate and Rhythm: Normal rate and regular rhythm.     Heart sounds: Normal heart sounds.  Pulmonary:     Effort: Pulmonary effort is normal.     Breath sounds: Normal breath sounds.  Abdominal:     General: Bowel sounds are normal.     Palpations: Abdomen is soft.  Musculoskeletal:        General: No tenderness or deformity. Normal range of motion.     Cervical back: Normal range of motion.  Lymphadenopathy:     Cervical: No cervical adenopathy.  Skin:    General: Skin is warm and dry.     Findings: No erythema or rash.  Neurological:     Mental Status: She is alert and oriented to person, place, and time.  Psychiatric:        Behavior: Behavior normal.        Thought Content: Thought content normal.        Judgment: Judgment normal.      Lab Results  Component Value Date   WBC 8.7 11/29/2024   HGB 12.9 11/29/2024   HCT 38.1 11/29/2024   MCV 92.0 11/29/2024   PLT 430 (H) 11/29/2024     Chemistry      Component Value Date/Time   NA 141 11/29/2024 0955   K 4.0 11/29/2024 0955   CL 105 11/29/2024 0955   CO2 25 11/29/2024 0955   BUN 7 11/29/2024 0955   CREATININE 1.08 (H) 11/29/2024 0955   CREATININE 0.94 06/27/2024 1206      Component Value Date/Time   CALCIUM  9.2  11/29/2024 0955   ALKPHOS 51 11/29/2024 0955   AST 16 11/29/2024 0955   ALT 17 11/29/2024 0955   BILITOT 0.5 11/29/2024 0955     Encounter Diagnoses  Name Primary?  Essential thrombocythemia (HCC) Yes   Iron deficiency anemia, unspecified iron deficiency anemia type    Thrombocytosis    Normocytic anemia     Assessment and Plan- Patient is a 53 y.o. female who is followed by our office for thrombocythemia.  She is JAK2 negative- 11/28/2023 and has known iron deficiency.    Today her CBC shows a WBC count of 8.7, Hgb of 12.9, MCV of 92.0, platelet count of 430 Iron studies pending She will continue her supplements at this time  RTC 6 months APP, labs (CBC w/, CMP, LDH, smear, iron, ferritin, B12, folate, erythro, retic)  Lauraine Dais PA-C 12/4/202510:35 AM

## 2024-11-30 LAB — ERYTHROPOIETIN: Erythropoietin: 9.8 m[IU]/mL (ref 2.6–18.5)

## 2024-12-03 ENCOUNTER — Ambulatory Visit: Payer: Self-pay | Admitting: Medical Oncology

## 2024-12-12 ENCOUNTER — Encounter: Payer: Self-pay | Admitting: Medical

## 2024-12-12 ENCOUNTER — Ambulatory Visit: Admitting: Medical

## 2024-12-12 VITALS — BP 120/70 | HR 70 | Temp 97.6°F | Resp 15 | Ht 62.0 in | Wt 164.2 lb

## 2024-12-12 DIAGNOSIS — I10 Essential (primary) hypertension: Secondary | ICD-10-CM

## 2024-12-12 DIAGNOSIS — R110 Nausea: Secondary | ICD-10-CM | POA: Diagnosis not present

## 2024-12-12 DIAGNOSIS — R197 Diarrhea, unspecified: Secondary | ICD-10-CM

## 2024-12-12 DIAGNOSIS — E669 Obesity, unspecified: Secondary | ICD-10-CM

## 2024-12-12 MED ORDER — COLESEVELAM HCL 625 MG PO TABS: 625.0000 mg | ORAL_TABLET | Freq: Two times a day (BID) | ORAL | 0 refills | Status: DC

## 2024-12-12 NOTE — Patient Instructions (Signed)
 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 Potential for improvement as body adjusts. - Prescribed Welchol twice daily.(rx advisement) - Encouraged increased fiber and probiotics.  Obesity Weight loss of six pounds with Wegovy . Goal: 150 pounds. Concerns about muscle mass loss addressed with protein intake and exercise. - Continue Wegovy  therapy. - Encouraged protein intake and weight lifting. - Advised on regular walking and exercise.  Hypertension Well-controlled with losartan . Recent BP 120/70 mmHg. Weight loss and lifestyle changes contributing to control. - Continue losartan  50 mg daily. - Encouraged continued weight loss and lifestyle modifications.  Nausea associated with GLP-1 agonist therapy Nausea post-Wegovy  injection managed with Compazine , effective for symptom relief. - Refilled Compazine  prescription.  General Health Maintenance Routine health maintenance discussed. Slightly elevated creatinine but GFR over 60, indicating adequate kidney function. - Schedule wellness exam in January. - Continue monitoring blood pressure and kidney function.  Follow up january wellness exam or sooner if needed

## 2024-12-12 NOTE — Progress Notes (Signed)
 Subjective:    Patient ID: Ann Frederick, female    DOB: [birth year], 53 y.o.   MRN: [id number]  HPI   Ann Frederick is a 53 year old female who presents with bile diarrhea following gallbladder removal surgery. Symptoms persist over year.  She has had daily bile diarrhea since cholecystectomy. Daily diarrheaSymptoms were more frequent initially but have partially improved.  She is increasing fiber and intermittently taking probiotics. She avoids fried and fatty foods. She is on Wegovy , which markedly reduces her appetite, so she eats smaller portions focused on protein.  She takes Compazine  for nausea that occurs 24 to 48 hours after each Wegovy  injection, which allows her to eat. She has lost about 6 pounds and is walking for exercise.  Her medications include losartan  50 mg daily for blood pressure, which she tolerates. She is concerned about slightly increased creatinine, though she reports GFR remains above 60.          Review of Systems  Constitutional:  Negative for chills and fever.  HENT:  Negative for congestion.   Respiratory:  Negative for chest tightness, shortness of breath and wheezing.   Cardiovascular:  Negative for chest pain and palpitations.  Gastrointestinal:  Negative for abdominal pain.  Genitourinary:  Negative for dysuria.  Musculoskeletal:  Negative for back pain and neck pain.  Skin:  Negative for rash.  Neurological:  Negative for dizziness, seizures and weakness.  Hematological:  Negative for adenopathy.  Psychiatric/Behavioral:  Negative for behavioral problems and decreased concentration. The patient is not hyperactive.       Past Medical History:  Diagnosis Date   Acne    Anemia    history of   Anxiety    Arthritis    Depression    GERD (gastroesophageal reflux disease)    worse while pregnant   Headache    Migraines   Heart murmur    ? heart murmur in past   History of blood transfusion 2013   Hypertension    Metabolic  syndrome      Social History   Socioeconomic History   Marital status: Married    Spouse name: Not on file   Number of children: 2   Years of education: Not on file   Highest education level: Master's degree (e.g., MA, MS, MEng, MEd, MSW, MBA)  Occupational History   Not on file  Tobacco Use   Smoking status: Never   Smokeless tobacco: Never  Vaping Use   Vaping status: Never Used  Substance and Sexual Activity   Alcohol use: Yes    Comment: 1 glass of wine per week prior to preg   Drug use: No   Sexual activity: Yes  Other Topics Concern   Not on file  Social History Narrative   Not on file   Social Drivers of Health   Tobacco Use: Low Risk (12/12/2024)   Patient History    Smoking Tobacco Use: Never    Smokeless Tobacco Use: Never    Passive Exposure: Not on file  Financial Resource Strain: Low Risk (12/09/2024)   Overall Financial Resource Strain (CARDIA)    Difficulty of Paying Living Expenses: Not hard at all  Food Insecurity: No Food Insecurity (12/09/2024)   Epic    Worried About Radiation Protection Practitioner of Food in the Last Year: Never true    Ran Out of Food in the Last Year: Never true  Transportation Needs: No Transportation Needs (12/09/2024)   Epic    Lack  of Transportation (Medical): No    Lack of Transportation (Non-Medical): No  Physical Activity: Insufficiently Active (12/09/2024)   Exercise Vital Sign    Days of Exercise per Week: 3 days    Minutes of Exercise per Session: 30 min  Stress: Stress Concern Present (12/09/2024)   [employer] of Occupational Health - Occupational Stress Questionnaire    Feeling of Stress: To some extent  Social Connections: Socially Integrated (12/09/2024)   Social Connection and Isolation Panel    Frequency of Communication with Friends and Family: More than three times a week    Frequency of Social Gatherings with Friends and Family: Once a week    Attends Religious Services: More than 4 times per year    Active Member  of Clubs or Organizations: Yes    Attends Banker Meetings: More than 4 times per year    Marital Status: Married  Catering Manager Violence: Not At Risk (06/10/2024)   Epic    Fear of Current or Ex-Partner: No    Emotionally Abused: No    Physically Abused: No    Sexually Abused: No  Depression (PHQ2-9): Low Risk (11/29/2024)   Depression (PHQ2-9)    PHQ-2 Score: 0  Alcohol Screen: Low Risk (12/09/2024)   Alcohol Screen    Last Alcohol Screening Score (AUDIT): 2  Housing: Low Risk (12/09/2024)   Epic    Unable to Pay for Housing in the Last Year: No    Number of Times Moved in the Last Year: 0    Homeless in the Last Year: No  Utilities: Not At Risk (06/10/2024)   Epic    Threatened with loss of utilities: No  Health Literacy: Not on file    Past Surgical History:  Procedure Laterality Date   ANTERIOR FUSION CERVICAL SPINE  2018   CESAREAN SECTION     x 2   CESAREAN SECTION  09/29/2012   Procedure: CESAREAN SECTION;  Surgeon: Alm JAYSON Cook, MD;  Location: [facility];  Service: Obstetrics;  Laterality: N/A;  Repeat edc 10/12/12/REQUEST;Chassity,Dee,Colleen   CHOLECYSTECTOMY N/A 02/26/2024   Procedure: LAPAROSCOPIC CHOLECYSTECTOMY  INTRAOPERATIVE CHOLANGIOGRAM;  Surgeon: Sheldon Standing, MD;  Location: [facility];  Service: General;  Laterality: N/A;   COLONOSCOPY     ESOPHAGOGASTRODUODENOSCOPY  normal    7/12   JOINT REPLACEMENT Bilateral    LAPAROSCOPIC VAGINAL HYSTERECTOMY WITH SALPINGECTOMY Bilateral 02/23/2016   Procedure: LAPAROSCOPIC ASSISTED VAGINAL HYSTERECTOMY WITH bilateral SALPINGECTOMY, right oophorectomy. laporotic repair of incidental cystotomy.;  Surgeon: Alm Cook, MD;  Location: [facility];  Service: Gynecology;  Laterality: Bilateral;   MOUTH SURGERY      Family History  Problem Relation Age of Onset   Depression Mother    Hypertension Mother    Diabetes Mother    Obesity Mother    Thyroid  disease Mother    Cancer Father        ? lung CA   Kidney  disease Father    Hydrocephalus Sister    Colon cancer Neg Hx    Esophageal cancer Neg Hx    Liver cancer Neg Hx    Pancreatic cancer Neg Hx    Rectal cancer Neg Hx    Stomach cancer Neg Hx     Allergies[1]  Medications Ordered Prior to Encounter[2]  BP 120/70   Pulse 70   Temp 97.6 F (36.4 C) (Oral)   Resp 15   Ht 5' 2 (1.575 m)   Wt 164 lb 3.2 oz (74.5 kg)  LMP 02/11/2016   SpO2 99%   BMI 30.03 kg/m          Objective:   Physical Exam  General Mental Status- Alert. General Appearance- Not in acute distress.   Skin General: Color- Normal Color. Moisture- Normal Moisture.  Neck Carotid Arteries- Normal color. Moisture- Normal Moisture. No carotid bruits. No JVD.  Chest and Lung Exam Auscultation: Breath Sounds:-CTA  Cardiovascular Auscultation:Rythm-RRR Murmurs & Other Heart Sounds:Auscultation of the heart reveals- No Murmurs.  Abdomen Inspection:-Inspeection Normal. Palpation/Percussion:Note:No mass. Palpation and Percussion of the abdomen reveal- Non Tender, Non Distended + BS, no rebound or guarding.   Neurologic Cranial Nerve exam:- CN III-XII intact(No nystagmus), symmetric smile. Strength:- 5/5 equal and symmetric strength both upper and lower extremities.       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 Potential for improvement as body adjusts. - Prescribed Welchol twice daily.(rx advisement) - Encouraged increased fiber and probiotics.  Obesity Weight loss of six pounds with Wegovy . Goal: 150 pounds. Concerns about muscle mass loss addressed with protein intake and exercise. - Continue Wegovy  therapy. - Encouraged protein intake and weight lifting. - Advised on regular walking and exercise.  Hypertension Well-controlled with losartan . Recent BP 120/70 mmHg. Weight loss and lifestyle changes contributing to control. - Continue losartan  50 mg daily. - Encouraged continued weight loss  and lifestyle modifications.  Nausea associated with GLP-1 agonist therapy Nausea post-Wegovy  injection managed with Compazine , effective for symptom relief. - Refilled Compazine  prescription.  General Health Maintenance Routine health maintenance discussed. Slightly elevated creatinine but GFR over 60, indicating adequate kidney function. - Schedule wellness exam in January. - Continue monitoring blood pressure and kidney function.  Follow up january wellness exam or sooner if needed    [1]  Allergies Allergen Reactions   Azithromycin  Diarrhea and Nausea And Vomiting  [2]  Current Outpatient Medications on File Prior to Visit  Medication Sig Dispense Refill   aspirin  EC 81 MG tablet Take 81 mg by mouth in the morning. Swallow whole.     buPROPion  (WELLBUTRIN  XL) 300 MG 24 hr tablet Take 300 mg by mouth every morning.     busPIRone  (BUSPAR ) 15 MG tablet TAKE 1 TABLET BY MOUTH TWICE A DAY 180 tablet 2   Cholecalciferol (VITAMIN D3) 1000 units CAPS Take 1,000 Units by mouth daily.     escitalopram (LEXAPRO) 10 MG tablet Take 10 mg by mouth daily.     estradiol (ESTRACE) 2 MG tablet Take 2 mg by mouth daily.     ezetimibe  (ZETIA ) 10 MG tablet Take 1 tablet (10 mg total) by mouth daily. 90 tablet 3   gabapentin  (NEURONTIN ) 300 MG capsule Take 1 capsule twice daily, then take 2 capsules at bedtime 120 capsule 2   losartan  (COZAAR ) 50 MG tablet Take 1 tablet (50 mg total) by mouth daily. 90 tablet 1   LYSINE PO Take 1 capsule by mouth daily.     ondansetron  (ZOFRAN -ODT) 4 MG disintegrating tablet Take 1 tablet (4 mg total) by mouth every 8 (eight) hours as needed. 8 tablet 0   semaglutide -weight management (WEGOVY ) 0.5 MG/0.5ML SOAJ SQ injection INJECT 0.5 MG INTO THE SKIN ONE TIME PER WEEK 2 mL 1   traZODone (DESYREL) 50 MG tablet Take 50 mg by mouth at bedtime as needed for sleep. (Patient taking differently: Take 25 mg by mouth at bedtime as needed for sleep.)     valACYclovir   (VALTREX ) 1000 MG tablet Take 2 tablets (2,000 mg total)  by mouth 2 (two) times daily. (Patient taking differently: Take 2,000 mg by mouth 2 (two) times daily. BID prn) 4 tablet 0   zolpidem  (AMBIEN ) 10 MG tablet Take 5-10 mg by mouth at bedtime as needed for sleep.     No current facility-administered medications on file prior to visit.

## 2024-12-15 ENCOUNTER — Other Ambulatory Visit: Payer: Self-pay | Admitting: Medical

## 2024-12-15 DIAGNOSIS — M5412 Radiculopathy, cervical region: Secondary | ICD-10-CM

## 2024-12-17 ENCOUNTER — Other Ambulatory Visit: Payer: Self-pay | Admitting: Medical

## 2025-01-06 ENCOUNTER — Other Ambulatory Visit: Payer: Self-pay | Admitting: Medical

## 2025-01-10 NOTE — Telephone Encounter (Signed)
 Error

## 2025-01-21 ENCOUNTER — Other Ambulatory Visit (HOSPITAL_COMMUNITY): Payer: Self-pay

## 2025-05-27 ENCOUNTER — Inpatient Hospital Stay

## 2025-05-27 ENCOUNTER — Inpatient Hospital Stay: Admitting: Medical Oncology
